# Patient Record
Sex: Female | Born: 1970 | Race: Black or African American | Hispanic: No | Marital: Married | Smoking: Former smoker
Health system: Southern US, Community
[De-identification: ages and names within clinical notes are randomized; demographics above are authoritative.]

## PROBLEM LIST (undated history)

## (undated) DIAGNOSIS — M1990 Unspecified osteoarthritis, unspecified site: Secondary | ICD-10-CM

## (undated) DIAGNOSIS — F32A Depression, unspecified: Secondary | ICD-10-CM

## (undated) DIAGNOSIS — I10 Essential (primary) hypertension: Secondary | ICD-10-CM

## (undated) DIAGNOSIS — D649 Anemia, unspecified: Secondary | ICD-10-CM

## (undated) DIAGNOSIS — R51 Headache: Secondary | ICD-10-CM

## (undated) DIAGNOSIS — F419 Anxiety disorder, unspecified: Secondary | ICD-10-CM

## (undated) DIAGNOSIS — J45909 Unspecified asthma, uncomplicated: Secondary | ICD-10-CM

## (undated) DIAGNOSIS — G2581 Restless legs syndrome: Secondary | ICD-10-CM

## (undated) DIAGNOSIS — S82852A Displaced trimalleolar fracture of left lower leg, initial encounter for closed fracture: Secondary | ICD-10-CM

## (undated) DIAGNOSIS — K219 Gastro-esophageal reflux disease without esophagitis: Secondary | ICD-10-CM

## (undated) DIAGNOSIS — J189 Pneumonia, unspecified organism: Secondary | ICD-10-CM

## (undated) DIAGNOSIS — R112 Nausea with vomiting, unspecified: Secondary | ICD-10-CM

## (undated) DIAGNOSIS — F319 Bipolar disorder, unspecified: Secondary | ICD-10-CM

## (undated) DIAGNOSIS — F329 Major depressive disorder, single episode, unspecified: Secondary | ICD-10-CM

## (undated) DIAGNOSIS — Z9889 Other specified postprocedural states: Secondary | ICD-10-CM

## (undated) DIAGNOSIS — R519 Headache, unspecified: Secondary | ICD-10-CM

## (undated) HISTORY — PX: DENTAL SURGERY: SHX609

## (undated) HISTORY — PX: TUBAL LIGATION: SHX77

---

## 1997-12-28 ENCOUNTER — Emergency Department (HOSPITAL_COMMUNITY): Admission: EM | Admit: 1997-12-28 | Discharge: 1997-12-28 | Payer: Self-pay | Admitting: Emergency Medicine

## 1999-06-03 ENCOUNTER — Emergency Department (HOSPITAL_COMMUNITY): Admission: EM | Admit: 1999-06-03 | Discharge: 1999-06-03 | Payer: Self-pay | Admitting: Emergency Medicine

## 1999-07-02 ENCOUNTER — Ambulatory Visit (HOSPITAL_COMMUNITY): Admission: RE | Admit: 1999-07-02 | Discharge: 1999-07-02 | Payer: Self-pay | Admitting: Gastroenterology

## 1999-07-02 ENCOUNTER — Encounter: Payer: Self-pay | Admitting: Gastroenterology

## 1999-07-23 ENCOUNTER — Emergency Department (HOSPITAL_COMMUNITY): Admission: EM | Admit: 1999-07-23 | Discharge: 1999-07-23 | Payer: Self-pay | Admitting: Emergency Medicine

## 1999-07-23 ENCOUNTER — Encounter: Payer: Self-pay | Admitting: Emergency Medicine

## 2000-08-29 ENCOUNTER — Encounter: Payer: Self-pay | Admitting: Emergency Medicine

## 2000-08-29 ENCOUNTER — Emergency Department (HOSPITAL_COMMUNITY): Admission: EM | Admit: 2000-08-29 | Discharge: 2000-08-29 | Payer: Self-pay | Admitting: Emergency Medicine

## 2000-08-31 ENCOUNTER — Encounter: Payer: Self-pay | Admitting: Emergency Medicine

## 2000-08-31 ENCOUNTER — Inpatient Hospital Stay (HOSPITAL_COMMUNITY): Admission: EM | Admit: 2000-08-31 | Discharge: 2000-09-03 | Payer: Self-pay | Admitting: Emergency Medicine

## 2000-09-01 ENCOUNTER — Encounter: Payer: Self-pay | Admitting: Neurosurgery

## 2000-09-05 ENCOUNTER — Encounter: Payer: Self-pay | Admitting: Emergency Medicine

## 2000-09-05 ENCOUNTER — Emergency Department (HOSPITAL_COMMUNITY): Admission: EM | Admit: 2000-09-05 | Discharge: 2000-09-05 | Payer: Self-pay | Admitting: Emergency Medicine

## 2000-09-05 ENCOUNTER — Ambulatory Visit (HOSPITAL_COMMUNITY): Admission: RE | Admit: 2000-09-05 | Discharge: 2000-09-05 | Payer: Self-pay

## 2001-10-17 ENCOUNTER — Emergency Department (HOSPITAL_COMMUNITY): Admission: EM | Admit: 2001-10-17 | Discharge: 2001-10-17 | Payer: Self-pay | Admitting: Emergency Medicine

## 2002-07-02 ENCOUNTER — Encounter: Payer: Self-pay | Admitting: Emergency Medicine

## 2002-07-02 ENCOUNTER — Emergency Department (HOSPITAL_COMMUNITY): Admission: EM | Admit: 2002-07-02 | Discharge: 2002-07-02 | Payer: Self-pay | Admitting: Emergency Medicine

## 2002-10-27 ENCOUNTER — Emergency Department (HOSPITAL_COMMUNITY): Admission: EM | Admit: 2002-10-27 | Discharge: 2002-10-27 | Payer: Self-pay | Admitting: Emergency Medicine

## 2003-02-14 ENCOUNTER — Encounter: Admission: RE | Admit: 2003-02-14 | Discharge: 2003-02-14 | Payer: Self-pay | Admitting: Internal Medicine

## 2003-02-14 ENCOUNTER — Encounter: Payer: Self-pay | Admitting: Internal Medicine

## 2003-04-20 ENCOUNTER — Encounter: Admission: RE | Admit: 2003-04-20 | Discharge: 2003-04-20 | Payer: Self-pay | Admitting: Internal Medicine

## 2003-04-20 ENCOUNTER — Encounter: Payer: Self-pay | Admitting: Internal Medicine

## 2003-08-03 ENCOUNTER — Emergency Department (HOSPITAL_COMMUNITY): Admission: EM | Admit: 2003-08-03 | Discharge: 2003-08-03 | Payer: Self-pay | Admitting: Emergency Medicine

## 2003-08-18 DIAGNOSIS — I10 Essential (primary) hypertension: Secondary | ICD-10-CM | POA: Insufficient documentation

## 2003-10-27 ENCOUNTER — Emergency Department (HOSPITAL_COMMUNITY): Admission: AD | Admit: 2003-10-27 | Discharge: 2003-10-27 | Payer: Self-pay | Admitting: Internal Medicine

## 2004-05-10 ENCOUNTER — Emergency Department (HOSPITAL_COMMUNITY): Admission: EM | Admit: 2004-05-10 | Discharge: 2004-05-10 | Payer: Self-pay

## 2005-03-23 DIAGNOSIS — M5137 Other intervertebral disc degeneration, lumbosacral region: Secondary | ICD-10-CM | POA: Insufficient documentation

## 2005-04-16 ENCOUNTER — Emergency Department (HOSPITAL_COMMUNITY): Admission: EM | Admit: 2005-04-16 | Discharge: 2005-04-16 | Payer: Self-pay | Admitting: Emergency Medicine

## 2005-05-28 ENCOUNTER — Emergency Department (HOSPITAL_COMMUNITY): Admission: EM | Admit: 2005-05-28 | Discharge: 2005-05-28 | Payer: Self-pay | Admitting: Emergency Medicine

## 2005-07-28 ENCOUNTER — Inpatient Hospital Stay (HOSPITAL_COMMUNITY): Admission: EM | Admit: 2005-07-28 | Discharge: 2005-07-29 | Payer: Self-pay | Admitting: Emergency Medicine

## 2006-10-23 ENCOUNTER — Emergency Department (HOSPITAL_COMMUNITY): Admission: EM | Admit: 2006-10-23 | Discharge: 2006-10-23 | Payer: Self-pay | Admitting: Emergency Medicine

## 2007-04-14 ENCOUNTER — Encounter: Admission: RE | Admit: 2007-04-14 | Discharge: 2007-04-14 | Payer: Self-pay | Admitting: Internal Medicine

## 2007-05-17 DIAGNOSIS — F319 Bipolar disorder, unspecified: Secondary | ICD-10-CM

## 2007-11-06 ENCOUNTER — Emergency Department (HOSPITAL_COMMUNITY): Admission: EM | Admit: 2007-11-06 | Discharge: 2007-11-07 | Payer: Self-pay | Admitting: Emergency Medicine

## 2007-11-09 ENCOUNTER — Encounter: Admission: RE | Admit: 2007-11-09 | Discharge: 2007-11-09 | Payer: Self-pay | Admitting: Internal Medicine

## 2008-11-22 ENCOUNTER — Encounter (INDEPENDENT_AMBULATORY_CARE_PROVIDER_SITE_OTHER): Payer: Self-pay | Admitting: Nurse Practitioner

## 2008-11-22 ENCOUNTER — Ambulatory Visit: Payer: Self-pay | Admitting: Internal Medicine

## 2008-11-22 DIAGNOSIS — K219 Gastro-esophageal reflux disease without esophagitis: Secondary | ICD-10-CM | POA: Insufficient documentation

## 2008-11-22 DIAGNOSIS — M25559 Pain in unspecified hip: Secondary | ICD-10-CM | POA: Insufficient documentation

## 2008-11-22 DIAGNOSIS — F329 Major depressive disorder, single episode, unspecified: Secondary | ICD-10-CM

## 2008-11-22 LAB — CONVERTED CEMR LAB
Bilirubin Urine: NEGATIVE
Blood in Urine, dipstick: NEGATIVE
Glucose, Urine, Semiquant: NEGATIVE
Ketones, urine, test strip: NEGATIVE
Nitrite: NEGATIVE
Protein, U semiquant: NEGATIVE
Specific Gravity, Urine: 1.015
Urobilinogen, UA: 0.2
WBC Urine, dipstick: NEGATIVE
pH: 6.5

## 2008-11-23 ENCOUNTER — Encounter (INDEPENDENT_AMBULATORY_CARE_PROVIDER_SITE_OTHER): Payer: Self-pay | Admitting: Nurse Practitioner

## 2008-11-23 LAB — CONVERTED CEMR LAB
Amphetamine Screen, Ur: NEGATIVE
Barbiturate Quant, Ur: NEGATIVE
Benzodiazepines.: NEGATIVE
Cocaine Metabolites: NEGATIVE
Creatinine,U: 145.6 mg/dL
Marijuana Metabolite: POSITIVE — AB
Methadone: NEGATIVE
Opiate Screen, Urine: NEGATIVE
Phencyclidine (PCP): NEGATIVE
Propoxyphene: NEGATIVE

## 2008-12-31 ENCOUNTER — Encounter (INDEPENDENT_AMBULATORY_CARE_PROVIDER_SITE_OTHER): Payer: Self-pay | Admitting: Nurse Practitioner

## 2009-01-21 ENCOUNTER — Encounter (INDEPENDENT_AMBULATORY_CARE_PROVIDER_SITE_OTHER): Payer: Self-pay | Admitting: Nurse Practitioner

## 2009-02-13 ENCOUNTER — Telehealth (INDEPENDENT_AMBULATORY_CARE_PROVIDER_SITE_OTHER): Payer: Self-pay | Admitting: Nurse Practitioner

## 2009-04-15 ENCOUNTER — Ambulatory Visit: Payer: Self-pay | Admitting: Nurse Practitioner

## 2009-04-16 ENCOUNTER — Encounter (INDEPENDENT_AMBULATORY_CARE_PROVIDER_SITE_OTHER): Payer: Self-pay | Admitting: Nurse Practitioner

## 2009-04-16 DIAGNOSIS — R809 Proteinuria, unspecified: Secondary | ICD-10-CM | POA: Insufficient documentation

## 2009-04-16 LAB — CONVERTED CEMR LAB
ALT: 8 units/L (ref 0–35)
AST: 12 units/L (ref 0–37)
Albumin: 4.1 g/dL (ref 3.5–5.2)
Alkaline Phosphatase: 61 units/L (ref 39–117)
BUN: 8 mg/dL (ref 6–23)
Basophils Absolute: 0 10*3/uL (ref 0.0–0.1)
Basophils Relative: 0 % (ref 0–1)
CO2: 22 meq/L (ref 19–32)
Calcium: 9.1 mg/dL (ref 8.4–10.5)
Chloride: 110 meq/L (ref 96–112)
Cholesterol: 139 mg/dL (ref 0–200)
Creatinine, Ser: 0.66 mg/dL (ref 0.40–1.20)
Eosinophils Absolute: 0.1 10*3/uL (ref 0.0–0.7)
Eosinophils Relative: 1 % (ref 0–5)
Glucose, Bld: 73 mg/dL (ref 70–99)
HCT: 36.8 % (ref 36.0–46.0)
HDL: 37 mg/dL — ABNORMAL LOW (ref >39)
Hemoglobin: 12 g/dL (ref 12.0–15.0)
LDL Cholesterol: 94 mg/dL (ref 0–99)
Lymphocytes Relative: 29 % (ref 12–46)
Lymphs Abs: 2.5 10*3/uL (ref 0.7–4.0)
MCHC: 32.6 g/dL (ref 30.0–36.0)
MCV: 92.7 fL (ref 78.0–100.0)
Microalb, Ur: 17.24 mg/dL — ABNORMAL HIGH (ref 0.00–1.89)
Monocytes Absolute: 0.8 10*3/uL (ref 0.1–1.0)
Monocytes Relative: 9 % (ref 3–12)
Neutro Abs: 5.4 10*3/uL (ref 1.7–7.7)
Neutrophils Relative %: 62 % (ref 43–77)
Platelets: 284 10*3/uL (ref 150–400)
Potassium: 4.8 meq/L (ref 3.5–5.3)
RBC: 3.97 M/uL (ref 3.87–5.11)
RDW: 15.7 % — ABNORMAL HIGH (ref 11.5–15.5)
Sodium: 141 meq/L (ref 135–145)
TSH: 0.53 microintl units/mL (ref 0.350–4.500)
Total Bilirubin: 0.2 mg/dL — ABNORMAL LOW (ref 0.3–1.2)
Total CHOL/HDL Ratio: 3.8
Total Protein: 6.8 g/dL (ref 6.0–8.3)
Triglycerides: 39 mg/dL (ref <150)
VLDL: 8 mg/dL (ref 0–40)
WBC: 8.8 10*3/uL (ref 4.0–10.5)

## 2009-05-06 ENCOUNTER — Telehealth (INDEPENDENT_AMBULATORY_CARE_PROVIDER_SITE_OTHER): Payer: Self-pay | Admitting: Nurse Practitioner

## 2009-06-24 ENCOUNTER — Telehealth (INDEPENDENT_AMBULATORY_CARE_PROVIDER_SITE_OTHER): Payer: Self-pay | Admitting: Nurse Practitioner

## 2009-06-30 ENCOUNTER — Emergency Department (HOSPITAL_COMMUNITY): Admission: EM | Admit: 2009-06-30 | Discharge: 2009-06-30 | Payer: Self-pay | Admitting: Emergency Medicine

## 2009-07-16 ENCOUNTER — Telehealth (INDEPENDENT_AMBULATORY_CARE_PROVIDER_SITE_OTHER): Payer: Self-pay | Admitting: *Deleted

## 2009-08-21 ENCOUNTER — Emergency Department (HOSPITAL_COMMUNITY): Admission: EM | Admit: 2009-08-21 | Discharge: 2009-08-21 | Payer: Self-pay | Admitting: Family Medicine

## 2009-08-27 ENCOUNTER — Telehealth (INDEPENDENT_AMBULATORY_CARE_PROVIDER_SITE_OTHER): Payer: Self-pay | Admitting: Nurse Practitioner

## 2009-09-16 ENCOUNTER — Encounter (INDEPENDENT_AMBULATORY_CARE_PROVIDER_SITE_OTHER): Payer: Self-pay | Admitting: Nurse Practitioner

## 2009-10-03 ENCOUNTER — Encounter: Admission: RE | Admit: 2009-10-03 | Discharge: 2009-10-03 | Payer: Self-pay | Admitting: Sports Medicine

## 2009-10-22 ENCOUNTER — Encounter: Admission: RE | Admit: 2009-10-22 | Discharge: 2009-10-22 | Payer: Self-pay | Admitting: Sports Medicine

## 2010-05-07 ENCOUNTER — Telehealth (INDEPENDENT_AMBULATORY_CARE_PROVIDER_SITE_OTHER): Payer: Self-pay | Admitting: Nurse Practitioner

## 2010-05-13 ENCOUNTER — Ambulatory Visit: Payer: Self-pay | Admitting: Nurse Practitioner

## 2010-05-13 DIAGNOSIS — R519 Headache, unspecified: Secondary | ICD-10-CM | POA: Insufficient documentation

## 2010-05-13 DIAGNOSIS — F17200 Nicotine dependence, unspecified, uncomplicated: Secondary | ICD-10-CM | POA: Insufficient documentation

## 2010-05-13 DIAGNOSIS — R51 Headache: Secondary | ICD-10-CM

## 2010-05-13 LAB — CONVERTED CEMR LAB
Bilirubin Urine: NEGATIVE
Blood in Urine, dipstick: NEGATIVE
Glucose, Urine, Semiquant: NEGATIVE
Ketones, urine, test strip: NEGATIVE
Microalb, Ur: 0.5 mg/dL (ref 0.00–1.89)
Nitrite: NEGATIVE
Protein, U semiquant: NEGATIVE
Specific Gravity, Urine: >=1.03
Urobilinogen, UA: 0.2
WBC Urine, dipstick: NEGATIVE
pH: 6

## 2010-06-27 ENCOUNTER — Ambulatory Visit (HOSPITAL_COMMUNITY): Admission: RE | Admit: 2010-06-27 | Discharge: 2010-06-27 | Payer: Self-pay | Admitting: Neurological Surgery

## 2010-06-27 HISTORY — PX: LAMINECTOMY AND MICRODISCECTOMY LUMBAR SPINE: SHX1913

## 2010-07-15 ENCOUNTER — Encounter: Admission: RE | Admit: 2010-07-15 | Discharge: 2010-07-15 | Payer: Self-pay | Admitting: Neurological Surgery

## 2010-07-22 ENCOUNTER — Telehealth (INDEPENDENT_AMBULATORY_CARE_PROVIDER_SITE_OTHER): Payer: Self-pay | Admitting: Nurse Practitioner

## 2010-08-05 ENCOUNTER — Encounter (INDEPENDENT_AMBULATORY_CARE_PROVIDER_SITE_OTHER): Payer: Self-pay | Admitting: Nurse Practitioner

## 2010-08-06 ENCOUNTER — Encounter (INDEPENDENT_AMBULATORY_CARE_PROVIDER_SITE_OTHER): Payer: Self-pay | Admitting: Nurse Practitioner

## 2010-08-12 ENCOUNTER — Encounter (INDEPENDENT_AMBULATORY_CARE_PROVIDER_SITE_OTHER): Payer: Self-pay | Admitting: Nurse Practitioner

## 2010-09-07 ENCOUNTER — Encounter: Payer: Self-pay | Admitting: Sports Medicine

## 2010-09-16 NOTE — Letter (Signed)
Summary: ORTHOPEDIC PROGRESS NOTES  ORTHOPEDIC PROGRESS NOTES   Imported By: Arta Bruce 11/22/2009 12:35:48  _____________________________________________________________________  External Attachment:    Type:   Image     Comment:   External Document

## 2010-09-16 NOTE — Progress Notes (Signed)
Summary: REFILLS  Phone Note Call from Patient Call back at Home Phone [phone]   Reason for Call: Refill Medication Summary of Call: Patricia Davila PT. MS GOODMAN SAYS THAT SHE NEEDS REFILLS ON ALL HER MEDICATIONS. THEY ARE PERCOCET, ATENOLOL, HCTZ, AND HER LISINIPRIL. SHE USES [facility] ON [address]. SHE SAYS THAT THE REASON WHY  SHE DIDN'T COME TO HER APPOINTMENTS WITH YOU, IS BECAUSE HER MEDICAID CARD WAS [place] ACCESS TO DR. MORIRA AND NOW IT HAS OUR NAME ON IT AND SHE IS SCHEDULED TO SEE YOU ON JULY THE 8th, BUT SHE IS OUT OF THESE MEDS AND SHE IS IN PAIN AND CAN HARDLY WALK. Initial call taken by: Leodis Rains,  February 13, 2009 9:05 AM  Follow-up for Phone Call        The pt called in again because she needs all her medications refills.  The pt cannot get up of bed because she is under a lot of pain.  Marland KitchenManon Hilding  February 14, 2009 12:18 PM   Additional Follow-up for Phone Call Additional follow up Details #1::        CALLED TO SEE IF HER MEDICATION HAVE BEEN CALLED IN, SHE SAYS THAT SHE HAS BEEN WITHOUT HER BP FOR 2 OR 3 DAYS NOW.Cala Bradford Tinnin  February 14, 2009 3:42 PM  Levon Hedger  February 14, 2009 3:47 PM  Forward to N. Daphine Deutscher, FNP     Additional Follow-up for Phone Call Additional follow up Details #2::    Rx printed and in basket pt needs to come pick up - meds include a narcotic Follow-up by: Lehman Prom FNP,  February 14, 2009 5:37 PM  Additional Follow-up for Phone Call Additional follow up Details #3:: Details for Additional Follow-up Action Taken: Levon Hedger  February 19, 2009 5:50 PM Left message for pt to return call to the office.  Levon Hedger  February 26, 2009 4:19 PM Left message on machine for pt to return call to the office.  pt picked up.      Prescriptions: PERCOCET 5-325 MG TABS (OXYCODONE-ACETAMINOPHEN) one tablet by mouth two times a day as needed for pain  #50 x 0   Entered and Authorized by:   Lehman Prom FNP   Signed by:    Lehman Prom FNP on 02/14/2009   Method used:   Print then Give to Patient   RxID:   [id number] LISINOPRIL 40 MG TABS (LISINOPRIL) One tablet by mouth daily for blood pressure  #30 x 1   Entered and Authorized by:   Lehman Prom FNP   Signed by:   Lehman Prom FNP on 02/14/2009   Method used:   Print then Give to Patient   RxID:   [id number] HYDROCHLOROTHIAZIDE 25 MG TABS (HYDROCHLOROTHIAZIDE) Take one (1) by mouth daily  #30 x 1   Entered and Authorized by:   Lehman Prom FNP   Signed by:   Lehman Prom FNP on 02/14/2009   Method used:   Print then Give to Patient   RxID:   [id number] NAPROSYN 500 MG TABS (NAPROXEN) 1 tablet by mouth two times a day for pain  #60 x 0   Entered and Authorized by:   Lehman Prom FNP   Signed by:   Lehman Prom FNP on 02/14/2009   Method used:   Print then Give to Patient   RxID:   [id number]

## 2010-09-16 NOTE — Progress Notes (Signed)
Summary: REQUESTING REFILLS  Phone Note Call from Patient Call back at Home Phone [phone]   Reason for Call: Refill Medication Summary of Call: Patricia Davila CALLED TO SEE IF SHE CAN GET A REFILL ON HER LISINOPRIL AND ATENOLOL HCTZ. SHE SASY THAT SHE CALLED [facility] TODAY AND THEY WERE SUPOSE TO HAVE FAXED OVER A REQUEST.  MS Davila IS SCHEDULE TO COME IN ON 09/27 Initial call taken by: Leodis Rains,  May 07, 2010 10:00 AM  Follow-up for Phone Call        Do you want to refill these meds?  Last seen 03/2009. Follow-up by: Dutch Quint RN,  May 07, 2010 11:40 AM  Additional Follow-up for Phone Call Additional follow up Details #1::        meds last refilled in 03/2009 will refill on her visit her on next week she needs a visit with labs Additional Follow-up by: Lehman Prom FNP,  May 07, 2010 12:04 PM    Additional Follow-up for Phone Call Additional follow up Details #2::    Unable to leave message - no answer.  Dutch Quint RN  May 07, 2010 12:57 PM  Advised pt. of provider's response -- verbalized understanding.  Dutch Quint RN  May 08, 2010 3:16 PM

## 2010-09-16 NOTE — Assessment & Plan Note (Signed)
Summary: HTN   Vital Signs:  Patient profile:   40 year old female Weight:      165 pounds BMI:     32.34 Temp:     97.0 degrees F oral Pulse rate:   62 / minute Pulse rhythm:   regular Resp:     22 per minute BP sitting:   160 / 100  (left arm) Cuff size:   regular  Vitals Entered By: Levon Hedger (May 13, 2010 10:48 AM)  Nutrition Counseling: Patient's BMI is greater than 25 and therefore counseled on weight management options. CC: follow-up visit BP...pt has been wi thout medication for months...has been having alot of migraine headaches more frequently lately...vaginal discharge with itching x 1 month, Hypertension Management, Abdominal Pain, Depression, Headache Is Patient Diabetic? No Pain Assessment Patient in pain? no       Does patient need assistance? Functional Status Self care Ambulation Normal   CC:  follow-up visit BP...pt has been wi thout medication for months...has been having alot of migraine headaches more frequently lately...vaginal discharge with itching x 1 month, Hypertension Management, Abdominal Pain, Depression, and Headache.  History of Present Illness:  Pt into the office for f/u on high blood pressure She reports that she has been in the ER twice since her last visit here. She reports that her she has not take her BP meds in over 6 months.   Depression History:      Positive alarm features for depression include insomnia and impaired concentration (indecisiveness).  However, she denies recurrent thoughts of death or suicide.        Psychosocial stress factors include major life changes.  The patient denies that she feels like life is not worth living, denies that she wishes that she were dead, and denies that she has thought about ending her life.         Dyspepsia History:      She has no alarm features of dyspepsia including no history of melena, hematochezia, dysphagia, persistent vomiting, or involuntary weight loss > 5%.  There  is a prior history of GERD.  The patient does not have a prior history of documented ulcer disease.  An H-2 blocker medication is not currently being taken.    Headache HPI:      The patient comes in for chronic management of headaches which have been unstable.  Since the last visit, the frequency of headaches have increased, and the intensity of the headaches have increased.  The headaches will last anywhere from 30 minutes to several days at a time.  She has approximately 5+ headaches per month.  Headaches have been occurring since age 49.  There is no family history of migraine headaches.        On a scale of 1-10, she describes the headache as a 5.  The headaches are not associated with an aura.  The location of the headaches are unilateral-left.  Headache quality is sharp (knife-like).  The headaches are associated with vomiting and photophobia.        The patient denies confusion and seizures.         Hypertension History:      She complains of headache, but denies chest pain and palpitations.  She notes no problems with any antihypertensive medication side effects.        Positive major cardiovascular risk factors include hypertension and current tobacco user.  Negative major cardiovascular risk factors include female age less than 93 years old and no  history of diabetes or hyperlipidemia.        Further assessment for target organ damage reveals no history of ASHD, cardiac end-organ damage (CHF/LVH), stroke/TIA, peripheral vascular disease, renal insufficiency, or hypertensive retinopathy.      Habits & Providers  Alcohol-Tobacco-Diet     Alcohol drinks/day: 0     Tobacco Status: current     Tobacco Counseling: to quit use of tobacco products     Cigarette Packs/Day: 2 cigs per day  Exercise-Depression-Behavior     Does Patient Exercise: no     Exercise Counseling: to improve exercise regimen     Depression Counseling: not indicated; screening negative for depression     Drug Use:  never     Seat Belt Use: always  Allergies: No Known Drug Allergies  Social History: Packs/Day:  2 cigs per day  Review of Systems General:  Denies fatigue. CV:  Complains of chest pain or discomfort. Resp:  Denies cough. GI:  Denies abdominal pain, nausea, and vomiting. Neuro:  Complains of headaches. Psych:  Complains of anxiety.  Physical Exam  General:  alert.   Head:  normocephalic.   Eyes:  pupils reactive to light.   Mouth:  tongue ring Lungs:  normal breath sounds.   Heart:  normal rate and regular rhythm.   Abdomen:  normal bowel sounds.   Msk:  up to the exam table Neurologic:  alert & oriented X3.   Skin:  color normal.   Psych:  Oriented X3.     Impression & Recommendations:  Problem # 1:  HYPERTENSION, BENIGN ESSENTIAL (ICD-401.1)  Blood pressure is extremely elevated advised pt to restart meds Her updated medication list for this problem includes:    Hydrochlorothiazide 25 Mg Tabs (Hydrochlorothiazide) .[name]... Take one (1) by mouth daily    Lisinopril 20 Mg Tabs (Lisinopril) ..... One tablet by mouth daily for blood pressure    Propranolol Hcl 20 Mg Tabs (Propranolol hcl) ..... One tablet by mouth two times a day for blood pressure  Orders: UA Dipstick w/o Micro (manual) (60454) T-Urine Microalbumin w/creat. ratio [id number])  Problem # 2:  TOBACCO ABUSE (ICD-305.1) advised cessation.  Problem # 3:  DEPRESSION, MAJOR (ICD-296.20) pt likely needs referral to mental health based on previous dx  Problem # 4:  HEADACHE (ICD-784.0) advised pt that headaches are likely due to htn need to control bp The following medications were removed from the medication list:    Naprosyn 500 Mg Tabs (Naproxen) .[name]... 1 tablet by mouth two times a day for pain    Percocet 5-325 Mg Tabs (Oxycodone-acetaminophen) ..... One tablet by mouth two times a day as needed for pain Her updated medication list for this problem includes:    Propranolol Hcl 20 Mg Tabs  (Propranolol hcl) ..... One tablet by mouth two times a day for blood pressure  Complete Medication List: 1)  Hydrochlorothiazide 25 Mg Tabs (Hydrochlorothiazide) .... Take one (1) by mouth daily 2)  Lisinopril 20 Mg Tabs (Lisinopril) .... One tablet by mouth daily for blood pressure 3)  Propranolol Hcl 20 Mg Tabs (Propranolol hcl) .... One tablet by mouth two times a day for blood pressure 4)  Viibryd 40 Mg Tabs (Vilazodone hcl) .... Titration pack  Hypertension Assessment/Plan:      The patient's hypertensive risk group is category B: At least one risk factor (excluding diabetes) with no target organ damage.  Her calculated 10 year risk of coronary heart disease is 3 %.  Today's blood pressure is  160/100.  Her blood pressure goal is < 140/90.    Patient Instructions: 1)  Keep your appointment on next week 2)  Do not eat after midnight before your next visit 3)  lab, PAP, PQH-9, u/a, microalbumin 4)  High blood pressure- - restart your medications  5)  Migraine - likely increased in frequency due to high blood pressure. 6)  Anxiety and mood - given your psych history - you may need referral to specialist. There are some other options available for mental health referral 7)  start viibrya and take according to starter package Prescriptions: VIIBRYD 40 MG TABS (VILAZODONE HCL) titration pack  #1 pack x 0   Entered and Authorized by:   Lehman Prom FNP   Signed by:   Lehman Prom FNP on 05/13/2010   Method used:   Samples Given   RxID:   [id number] LISINOPRIL 20 MG TABS (LISINOPRIL) One tablet by mouth daily for blood pressure  #30 x 3   Entered and Authorized by:   Lehman Prom FNP   Signed by:   Lehman Prom FNP on 05/13/2010   Method used:   Print then Give to Patient   RxID:   [id number] HYDROCHLOROTHIAZIDE 25 MG TABS (HYDROCHLOROTHIAZIDE) Take one (1) by mouth daily  #30 x 3   Entered and Authorized by:   Lehman Prom FNP   Signed by:   Lehman Prom  FNP on 05/13/2010   Method used:   Print then Give to Patient   RxID:   [id number] PROPRANOLOL HCL 20 MG TABS (PROPRANOLOL HCL) One tablet by mouth two times a day for blood pressure  #60 x 3   Entered and Authorized by:   Lehman Prom FNP   Signed by:   Lehman Prom FNP on 05/13/2010   Method used:   Print then Give to Patient   RxID:   [id number]   Laboratory Results   Urine Tests  Date/Time Received: May 13, 2010 10:58 AM   Routine Urinalysis   Color: yellow Appearance: Clear Glucose: negative   (Normal Range: Negative) Bilirubin: negative   (Normal Range: Negative) Ketone: negative   (Normal Range: Negative) Spec. Gravity: >=1.030   (Normal Range: 1.003-1.035) Blood: negative   (Normal Range: Negative) pH: 6.0   (Normal Range: 5.0-8.0) Protein: negative   (Normal Range: Negative) Urobilinogen: 0.2   (Normal Range: 0-1) Nitrite: negative   (Normal Range: Negative) Leukocyte Esterace: negative   (Normal Range: Negative)

## 2010-09-16 NOTE — Progress Notes (Signed)
Summary: percocet refill  Phone Note Call from Patient Call back at [phone]   Summary of Call: pt calling to get refills on her percocet....last rx 12/8 .... Initial call taken by: Mikey College CMA,  August 27, 2009 10:55 AM  Follow-up for Phone Call        Pt was supposed to get an orthopedic referral (ordered back in August 2010). Clarify with pt if she went to this appt because the intent was NOT for her to continue to get chronic pain meds from this office.  She should find the source for pain and get it treated by the specialist Follow-up by: Lehman Prom FNP,  August 27, 2009 11:21 AM  Additional Follow-up for Phone Call Additional follow up Details #1::        left message to return call.....Marland KitchenMarland KitchenMikey College CMA  August 27, 2009 1:08 PM   Levon Hedger  August 28, 2009 3:12 PM spoke with pt and she says that she never heard anything back about the appt to Ortho,  she says that she spoke with someone when she came in to get her last Rx about the referral.  Pt states she went to urgent care on last week and they gave her a knee brace and some crutches.  I will pull up notes from E-charts and put them in your cubby.    Additional Follow-up for Phone Call Additional follow up Details #2::    Percocet RX printed and in basket. pt can pick up. I reviewed ER record and they too agree that pt needs ortho refer (Confirm phone number in EMR as this will print on referral - if it is different will need to MANUALLY change on the referral as I have already printed ortho referral in put in basket) notify pt that she will be notified of her ortho  referral and she will need to be sure to keep the appt this office is NOT continue to provide her narcotic medication with specialist intervention Follow-up by: Lehman Prom FNP,  August 29, 2009 8:21 AM  Additional Follow-up for Phone Call Additional follow up Details #3:: Details for Additional Follow-up Action Taken: Levon Hedger   August 29, 2009 9:46 AM Called [phone] could not leave a message.  Levon Hedger  August 30, 2009 5:33 PM called [phone] could not leave a message.  Pt now has an appt scheduled 1/28 w/Dr Kramer(orthopedic) [address] [phone]).pt is aware of appt per Carney Bern....Marland KitchenMarland KitchenMikey College CMA  September 02, 2009 3:33 PM   Prescriptions: PERCOCET 5-325 MG TABS (OXYCODONE-ACETAMINOPHEN) one tablet by mouth two times a day as needed for pain  #50 x 0   Entered and Authorized by:   Lehman Prom FNP   Signed by:   Lehman Prom FNP on 08/29/2009   Method used:   Print then Give to Patient   RxID:   [id number]

## 2010-09-18 NOTE — Letter (Signed)
Summary: MAILED REQUESTED RECORDS TO [facility]  MAILED REQUESTED RECORDS TO [facility]   Imported By: Arta Bruce 08/12/2010 14:53:55  _____________________________________________________________________  External Attachment:    Type:   Image     Comment:   External Document

## 2010-09-18 NOTE — Miscellaneous (Signed)
Summary: PA approved for Viibryd 40 mg. x1 year 08/05/10-08/06/11  Clinical Lists Changes  PA approved for Viibryd 40 mg. x1 year 08/05/10-08/06/11  Appended Document: PA approved for Viibryd 40 mg. x1 year 08/05/10-08/06/11 Viibryd needs to be added to medication list- was removed.  Dutch Quint RN  August 07, 2010 1:03 PM

## 2010-09-18 NOTE — Letter (Signed)
Summary: [facility]  [facility]   Imported By: Arta Bruce 08/19/2010 14:50:35  _____________________________________________________________________  External Attachment:    Type:   Image     Comment:   External Document

## 2010-09-18 NOTE — Progress Notes (Signed)
Summary: PA needed for Viibryd  Phone Note Call from Patient   Summary of Call: Pt called saying that [facility] gave her a pill packet @ her last visit  for blood prssure and they also help her migraines/would like for her to call [facility] in to [facility] on [address]///[phone] Initial call taken by: Arta Bruce,  July 22, 2010 9:16 AM  Follow-up for Phone Call        Needs refill on Viibryd -- do you want to refill?  Dutch Quint RN  July 22, 2010 4:02 PM    Additional Follow-up for Phone Call Additional follow up Details #1::        Refills done and sent to pharmacy - this may need prior approval Additional Follow-up by: Lehman Prom FNP,  July 22, 2010 4:35 PM    Additional Follow-up for Phone Call Additional follow up Details #2::    PA request sent from pharmacy for Viibryd -- waiting for fax from [facility].  Dutch Quint RN  July 25, 2010 3:21 PM  Received prior authorization request for Viibryd .  Pt. doesn't show a history of having tried and failed meds.  Do you want to change this med or attempt to obtain PA?  Dutch Quint RN  July 29, 2010 12:08 PM    Additional Follow-up for Phone Call Additional follow up Details #3:: Details for Additional Follow-up Action Taken: Yes, try prior approval.  I looked back and pt has been on both zyprexa and lexapro in the past symptoms uncontrolled with previous meds n.martin,fnp  July 30, 2010  10:14 AM  PA in progress.  Dutch Quint RN  August 04, 2010 5:00 PM  PA approved x1 year.  Pharmacy notified by fax.  Left message on answering machine for pt. to return call.  Dutch Quint RN  August 06, 2010 9:24 AM  Pt. notified of PA approval and to call pharmacy.  Dutch Quint RN  August 07, 2010 1:01 PM    New/Updated Medications: VIIBRYD 40 MG TABS (VILAZODONE HCL) One tablet by mouth daily Prescriptions: VIIBRYD 40 MG TABS (VILAZODONE HCL) One tablet by mouth  daily  #30 x 5   Entered and Authorized by:   Lehman Prom FNP   Signed by:   Lehman Prom FNP on 07/22/2010   Method used:   Electronically to        [facility] [address] [address] [phone]* (retail)       [address]       [place], [place]  [address]       Ph: [phone]       Fax: [phone]   RxID:   [id number]

## 2010-10-28 LAB — BASIC METABOLIC PANEL
BUN: 7 mg/dL (ref 6–23)
CO2: 25 mEq/L (ref 19–32)
Calcium: 9.2 mg/dL (ref 8.4–10.5)
Chloride: 105 mEq/L (ref 96–112)
Creatinine, Ser: 0.75 mg/dL (ref 0.4–1.2)
GFR calc Af Amer: >60 mL/min (ref >60)
GFR calc non Af Amer: >60 mL/min (ref >60)
Glucose, Bld: 84 mg/dL (ref 70–99)
Potassium: 4 mEq/L (ref 3.5–5.1)
Sodium: 137 mEq/L (ref 135–145)

## 2010-10-28 LAB — CBC
HCT: 36.6 % (ref 36.0–46.0)
Hemoglobin: 12.3 g/dL (ref 12.0–15.0)
MCH: 29.5 pg (ref 26.0–34.0)
MCHC: 33.6 g/dL (ref 30.0–36.0)
MCV: 87.8 fL (ref 78.0–100.0)
Platelets: 287 10*3/uL (ref 150–400)
RBC: 4.17 MIL/uL (ref 3.87–5.11)
RDW: 15.6 % — ABNORMAL HIGH (ref 11.5–15.5)
WBC: 7.8 10*3/uL (ref 4.0–10.5)

## 2010-10-28 LAB — DIFFERENTIAL
Basophils Relative: 0 % (ref 0–1)
Eosinophils Absolute: 0.1 10*3/uL (ref 0.0–0.7)
Monocytes Absolute: 0.4 10*3/uL (ref 0.1–1.0)
Monocytes Relative: 5 % (ref 3–12)
Neutrophils Relative %: 65 % (ref 43–77)

## 2010-11-19 LAB — URINALYSIS, ROUTINE W REFLEX MICROSCOPIC
Glucose, UA: NEGATIVE mg/dL
Leukocytes, UA: NEGATIVE
pH: 8 (ref 5.0–8.0)

## 2010-11-19 LAB — POCT I-STAT, CHEM 8
Creatinine, Ser: 0.7 mg/dL (ref 0.4–1.2)
Glucose, Bld: 83 mg/dL (ref 70–99)
Hemoglobin: 12.6 g/dL (ref 12.0–15.0)
Potassium: 3.6 mEq/L (ref 3.5–5.1)
TCO2: 24 mmol/L (ref 0–100)

## 2010-11-19 LAB — URINE MICROSCOPIC-ADD ON

## 2010-12-30 ENCOUNTER — Other Ambulatory Visit: Payer: Self-pay | Admitting: Neurological Surgery

## 2010-12-30 DIAGNOSIS — M545 Low back pain: Secondary | ICD-10-CM
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
## 2011-01-02 NOTE — Discharge Summary (Signed)
NAMEClide Davila NO.:  [id number]   MEDICAL RECORD NO.:  [id number]          PATIENT TYPE:  INP   LOCATION:  1431                         FACILITY:  [facility]   PHYSICIAN:  Nelma Rothman, MD   DATE OF BIRTH:  1970/11/26   DATE OF ADMISSION:  07/27/2005  DATE OF DISCHARGE:  07/29/2005                                 DISCHARGE SUMMARY   PRIMARY CARE PHYSICIAN:  Ralene Ok, M.D.   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  Depression.  2.  Hypertension.   PROCEDURES:  1.  Chest x-ray demonstrated no evidence of acute cardiopulmonary disease.      Three views of the left hand demonstrated no evidence for osteomyelitis      or acute osseous abnormality.  2.  Three views of the left ankle demonstrated no acute osseous      abnormalities.  3.  Four views of the left elbow demonstrated a probable joint effusion as      demonstrated by elevation of the anterior and posterior fat pad. There      were no acute osseous abnormalities or fractures.   LABORATORY DATA:  White blood cell count on the morning of discharge was  12.1, hemoglobin 11.4, and platelet count 310,000. Creatinine was 0.6. C-  reactive protein was 5.3. HIV test was negative. Serum RPR was negative. GC  and chlamydia PCR probe were positive in the urine.   HOSPITAL COURSE:  Patricia Davila is a 40 year old female who presented to the  [facility] Emergency Department on December 11, 206 with complaints of  polyarthralgia, specifically left ankle and left elbow pain. Exam was also  consistent with a left fourth digit tenosynovitis of the hand. Given history  of multiple sexually-transmitted diseases, there was clinical concern for  disseminated gonococcal infection. She was started on IV ceftriaxone as well  as oral doxycycline for concomitant treatment for chlamydia. Her urine was  sent for a GC and chlamydia PCR which did in fact return  positive. Following  two doses of IV ceftriaxone, she had improved dramatically and was  requesting to be discharged to home. This was discussed briefly with Dr.  Orvan Falconer of infectious disease consult. Given her dramatic improvement in  symptoms with antibiotic therapy, this was felt to represent a disseminated  gonococcal infection. Therefore, she will be discharged home with an  additional five days of oral doxycycline to complete a seven-day course for  concomitant chlamydia infection as well as an additional five days of oral  ciprofloxacin to complete therapy for a  disseminated gonococcal infection.  She will follow up with Dr. Ludwig Clarks later this week and will also be  contacted by the health department. She was reminded that she needs to have  her partner treated for both chlamydia and gonorrhea, and she states that  she has already informed him of such.   DISCHARGE MEDICATIONS:  1.  Doxycycline 100 mg p.o. b.i.d. x5 days.  2.  Ciprofloxacin 500 mg p.o. b.i.d. x5 days.  3.  Resume previous home medications including:  1.  Atenolol 25 mg p.o. daily.      2.  Prilosec 20 mg p.o. daily.      3.  Zoloft 50 mg daily.  4.  Vicodin 5/500 1-2 tablets p.o. q.4 h. p.r.n. pain, dispense #15.   DISCHARGE INSTRUCTIONS:  The patient was reminded that she needs to have her  sexual partner treated for both chlamydia and gonorrhea. She will complete  both antibiotics until all pills have been taken. She will return to Dr.  Ludwig Clarks later this week for followup.   CONDITION ON DISCHARGE:  The patient was much improved on discharge. She was  afebrile, vital signs stable, and wished to be discharged to home, which I  believe is reasonable.      Nelma Rothman, MD  Electronically Signed     RAR/MEDQ  D:  07/29/2005  T:  07/30/2005  Job:  [id number]   cc:   Ralene Ok, M.D.  Fax: [phone]
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
## 2011-01-02 NOTE — H&P (Signed)
NAMEClide Davila NO.:  [id number]   MEDICAL RECORD NO.:  [id number]          PATIENT TYPE:  EMS   LOCATION:  ED                           FACILITY:  [facility]   PHYSICIAN:  Lonia Blood, M.D.       DATE OF BIRTH:  September 05, 1970   DATE OF ADMISSION:  07/27/2005  DATE OF DISCHARGE:                                HISTORY & PHYSICAL   PRIMARY CARE PHYSICIAN:  Ralene Ok, M.D.   CHIEF COMPLAINT:  Joint pain.   HISTORY OF PRESENT ILLNESS:  [name] is a 40 year old African American  woman with history of sexually transmitted disease who presents to [facility] Emergency Room with one-day history of severe left ankle pain, left  elbow pain and left hand pain.  She denies any fever or chills.  She reports  that she has a vaginal discharge and some lower abdominal pain for the past  week.   PAST MEDICAL HISTORY:  1.  Depression.  2.  Hypertension.  3.  Bilateral tubal ligation.  4.  Gonorrhea.  5.  Chlamydia.  6.  Trichomonas.  7.  The patient had an HIV test about two weeks ago that was negative.   FAMILY HISTORY:  Positive for colon cancer in the mother and one sister.   SOCIAL HISTORY:  The patient smokes a pack of cigarettes a day.  Does not  drink alcohol.  She was disabled.  She has three children. She is single.   MEDICATIONS:  Zyprexa, omeprazole, atenolol, Zoloft and Vicodin.   ALLERGIES:  No known drug allergies.   REVIEW OF SYSTEMS:  As per HPI.  Other systems are negative.   REVIEW OF SYSTEMS:  As per HPI.  Twelve other systems were reviewed and were  negative.   PHYSICAL EXAMINATION:  VITAL SIGNS:  Temperature 97.6, pulse 60,  respirations 20, blood pressure 117/73, saturation 99% on room air.  GENERAL:  The patient is in no acute distress.  Alert and oriented to place,  person and time.  Well-developed, well-nourished.  HEENT:  Head is normocephalic, atraumatic.  Pupils are equal and round and  reactive to light and accommodation.   Extraocular muscles were intact. Mouth  without ulceration.  Throat is clear.  NECK:  Supple without JVD.  No carotid bruits.  CHEST:  Clear to auscultation bilaterally without wheezes, rhonchi, or  crackles.  HEART:  Regular rate and rhythm without murmurs, rubs, or gallops.  ABDOMEN:  Soft, nontender, nondistended.  Bowel sounds are present.  No  palpable hepatosplenomegaly.  GU:  There is no CVA tenderness.  EXTREMITIES:  The left ankle has mild edema, some tenderness to  mobilization.  Left elbow has minimal edema and some tenderness on  palpation.  There is tenderness over the palpation of the  metacarpophalangeal joint in the left hand.  SKIN:  Warm and dry.  No suspicious rashes.  PELVIC:  Deferred for a private room.  NEUROLOGIC:  The patient has 5/5 strength in all four extremities  bilaterally.  Cranial nerves II-XII are intact.   LABORATORY DATA:  White blood cell  count 13,000, hemoglobin 11, platelet  count 305, ANC 11,000.  Sodium 138, potassium 3, chloride 107, bicarb 24,  BUN 7, creatinine 0.7, glucose 137. ESR  45.   IMPRESSION:  1.  Polyarthritis.  This is asymmetric in nature.  Given the patient's      history, this is worrisome for reactive arthritis to [facility] or Chlamydia.      Plan is to obtain blood cultures and urine cultures and urine for      gonorrhea and Chlamydia.  Test also for syphilis and HIV.  Treat      empirically with intravenous Rocephin and oral doxycycline.  Will also      obtain x-ray of the elbow and ankle.  2.  Hypokalemia.  The patient most likely is taking a diuretic at home. She      just does not remember the name.  Plan is to replace the potassium      orally.  3.  History of sexually transmitted diseases.  Will test for gonorrhea,      Chlamydia, syphilis, and HIV and treat as necessary.      Lonia Blood, M.D.  Electronically Signed     SL/MEDQ  D:  07/28/2005  T:  07/28/2005  Job:  [id number]   cc:   Ralene Ok, M.D.  Fax:  [phone]

## 2011-01-05 ENCOUNTER — Other Ambulatory Visit: Payer: Self-pay

## 2011-01-06 ENCOUNTER — Ambulatory Visit
Admission: RE | Admit: 2011-01-06 | Discharge: 2011-01-06 | Disposition: A | Discharge status: Home / Self Care | Payer: PRIVATE HEALTH INSURANCE | Source: Ambulatory Visit | Attending: Neurological Surgery | Admitting: Neurological Surgery

## 2011-01-06 DIAGNOSIS — M545 Low back pain: Secondary | ICD-10-CM

## 2011-01-06 MED ORDER — GADOBENATE DIMEGLUMINE 529 MG/ML IV SOLN
15.0000 mL | Freq: Once | INTRAVENOUS | Status: AC | PRN
  Administered 2011-01-06: 15 mL via INTRAVENOUS

## 2011-05-11 LAB — POCT I-STAT, CHEM 8
Calcium, Ion: 1.1 — ABNORMAL LOW
Creatinine, Ser: 0.9
Glucose, Bld: 80
Hemoglobin: 13.3
Sodium: 141
TCO2: 22

## 2011-05-11 LAB — DIFFERENTIAL
Eosinophils Absolute: 0.1
Eosinophils Relative: 1
Lymphs Abs: 2.7
Monocytes Absolute: 0.4
Monocytes Relative: 4

## 2011-05-11 LAB — CBC
HCT: 37.3
Hemoglobin: 12.7
MCV: 89.2
Platelets: 253
WBC: 10.4

## 2011-05-11 LAB — URINALYSIS, ROUTINE W REFLEX MICROSCOPIC
Bilirubin Urine: NEGATIVE
Glucose, UA: NEGATIVE
Hgb urine dipstick: NEGATIVE
Specific Gravity, Urine: 1.015
Urobilinogen, UA: 0.2

## 2012-07-10 ENCOUNTER — Emergency Department (HOSPITAL_COMMUNITY): Payer: PRIVATE HEALTH INSURANCE

## 2012-07-10 ENCOUNTER — Emergency Department (HOSPITAL_COMMUNITY)
Admission: EM | Admit: 2012-07-10 | Discharge: 2012-07-10 | Disposition: A | Discharge status: Home / Self Care | Payer: PRIVATE HEALTH INSURANCE | Attending: Emergency Medicine | Admitting: Emergency Medicine

## 2012-07-10 ENCOUNTER — Encounter (HOSPITAL_COMMUNITY): Payer: Self-pay

## 2012-07-10 DIAGNOSIS — S0181XA Laceration without foreign body of other part of head, initial encounter: Secondary | ICD-10-CM

## 2012-07-10 DIAGNOSIS — M545 Low back pain, unspecified: Secondary | ICD-10-CM | POA: Insufficient documentation

## 2012-07-10 DIAGNOSIS — R51 Headache: Secondary | ICD-10-CM | POA: Insufficient documentation

## 2012-07-10 DIAGNOSIS — M542 Cervicalgia: Secondary | ICD-10-CM | POA: Insufficient documentation

## 2012-07-10 DIAGNOSIS — Z23 Encounter for immunization: Secondary | ICD-10-CM | POA: Insufficient documentation

## 2012-07-10 DIAGNOSIS — S0003XA Contusion of scalp, initial encounter: Secondary | ICD-10-CM | POA: Insufficient documentation

## 2012-07-10 DIAGNOSIS — T148XXA Other injury of unspecified body region, initial encounter: Secondary | ICD-10-CM

## 2012-07-10 DIAGNOSIS — S0180XA Unspecified open wound of other part of head, initial encounter: Secondary | ICD-10-CM | POA: Insufficient documentation

## 2012-07-10 DIAGNOSIS — M546 Pain in thoracic spine: Secondary | ICD-10-CM | POA: Insufficient documentation

## 2012-07-10 HISTORY — DX: Unspecified asthma, uncomplicated: J45.909

## 2012-07-10 HISTORY — DX: Essential (primary) hypertension: I10

## 2012-07-10 MED ORDER — ONDANSETRON HCL 4 MG/2ML IJ SOLN
4.0000 mg | Freq: Once | INTRAMUSCULAR | Status: AC
  Administered 2012-07-10: 4 mg via INTRAVENOUS
  Filled 2012-07-10: qty 2

## 2012-07-10 MED ORDER — HYDROMORPHONE HCL PF 2 MG/ML IJ SOLN
2.0000 mg | Freq: Once | INTRAMUSCULAR | Status: AC
  Administered 2012-07-10: 2 mg via INTRAMUSCULAR
  Filled 2012-07-10: qty 1

## 2012-07-10 MED ORDER — CYCLOBENZAPRINE HCL 10 MG PO TABS: 10.0000 mg | ORAL_TABLET | Freq: Two times a day (BID) | ORAL | Status: DC | PRN

## 2012-07-10 MED ORDER — OXYCODONE-ACETAMINOPHEN 5-325 MG PO TABS: 1.0000 | ORAL_TABLET | Freq: Four times a day (QID) | ORAL | Status: DC | PRN

## 2012-07-10 MED ORDER — TETANUS-DIPHTH-ACELL PERTUSSIS 5-2.5-18.5 LF-MCG/0.5 IM SUSP
0.5000 mL | Freq: Once | INTRAMUSCULAR | Status: AC
  Administered 2012-07-10: 0.5 mL via INTRAMUSCULAR
  Filled 2012-07-10: qty 0.5

## 2012-07-10 MED ORDER — ONDANSETRON HCL 4 MG PO TABS: 4.0000 mg | ORAL_TABLET | Freq: Four times a day (QID) | ORAL | Status: DC

## 2012-07-10 MED ORDER — ONDANSETRON 4 MG PO TBDP
4.0000 mg | ORAL_TABLET | Freq: Once | ORAL | Status: AC
  Administered 2012-07-10: 4 mg via ORAL
  Filled 2012-07-10: qty 1

## 2012-07-10 NOTE — ED Provider Notes (Signed)
Medical screening examination/treatment/procedure(s) were conducted as a shared visit with non-physician practitioner(s) and myself.  I personally evaluated the patient during the encounter    Gwyneth Sprout, MD 07/10/12 [phone]
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
## 2012-07-10 NOTE — ED Provider Notes (Signed)
I was asked to performed forehead laceration repair.  LACERATION REPAIR Performed by: Fayrene Helper Authorized byFayrene Helper Consent: Verbal consent obtained. Risks and benefits: risks, benefits and alternatives were discussed Consent given by: patient Patient identity confirmed: provided demographic data Prepped and Draped in normal sterile fashion Wound explored  Laceration Location: L upper forehead  Laceration Length: 1.5 cm  No Foreign Bodies seen or palpated  Anesthesia: local infiltration  Local anesthetic: lidocaine 2% w epinephrine  Anesthetic total: 8 ml  Irrigation method: syringe Amount of cleaning: standard  Skin closure: prolene 5.0  Number of sutures: 4  Technique: simple interrupted  Patient tolerance: Patient tolerated the procedure well with no immediate complications.   Fayrene Helper, PA-C 07/10/12 1831

## 2012-10-24 ENCOUNTER — Ambulatory Visit: Payer: PRIVATE HEALTH INSURANCE | Admitting: Internal Medicine

## 2012-10-24 ENCOUNTER — Encounter: Payer: Self-pay | Admitting: Internal Medicine

## 2012-10-24 DIAGNOSIS — F329 Major depressive disorder, single episode, unspecified: Secondary | ICD-10-CM | POA: Insufficient documentation

## 2013-03-29 ENCOUNTER — Encounter (HOSPITAL_COMMUNITY): Payer: Self-pay | Admitting: Emergency Medicine

## 2013-03-29 ENCOUNTER — Emergency Department (HOSPITAL_COMMUNITY)
Admission: EM | Admit: 2013-03-29 | Discharge: 2013-03-29 | Disposition: A | Discharge status: Home / Self Care | Payer: PRIVATE HEALTH INSURANCE | Attending: Emergency Medicine | Admitting: Emergency Medicine

## 2013-03-29 DIAGNOSIS — I10 Essential (primary) hypertension: Secondary | ICD-10-CM | POA: Insufficient documentation

## 2013-03-29 DIAGNOSIS — F17200 Nicotine dependence, unspecified, uncomplicated: Secondary | ICD-10-CM | POA: Insufficient documentation

## 2013-03-29 DIAGNOSIS — J36 Peritonsillar abscess: Secondary | ICD-10-CM | POA: Insufficient documentation

## 2013-03-29 DIAGNOSIS — Z79899 Other long term (current) drug therapy: Secondary | ICD-10-CM | POA: Insufficient documentation

## 2013-03-29 DIAGNOSIS — J45909 Unspecified asthma, uncomplicated: Secondary | ICD-10-CM | POA: Insufficient documentation

## 2013-03-29 DIAGNOSIS — R05 Cough: Secondary | ICD-10-CM | POA: Insufficient documentation

## 2013-03-29 DIAGNOSIS — R059 Cough, unspecified: Secondary | ICD-10-CM | POA: Insufficient documentation

## 2013-03-29 DIAGNOSIS — R509 Fever, unspecified: Secondary | ICD-10-CM | POA: Insufficient documentation

## 2013-03-29 MED ORDER — MORPHINE SULFATE 4 MG/ML IJ SOLN
4.0000 mg | Freq: Once | INTRAMUSCULAR | Status: AC
  Administered 2013-03-29: 4 mg via INTRAVENOUS
  Filled 2013-03-29: qty 1

## 2013-03-29 MED ORDER — ONDANSETRON 4 MG PO TBDP
8.0000 mg | ORAL_TABLET | Freq: Once | ORAL | Status: AC
  Administered 2013-03-29: 8 mg via ORAL
  Filled 2013-03-29: qty 2

## 2013-03-29 MED ORDER — ONDANSETRON HCL 4 MG PO TABS: 4.0000 mg | ORAL_TABLET | Freq: Four times a day (QID) | ORAL | Status: DC

## 2013-03-29 MED ORDER — OXYCODONE-ACETAMINOPHEN 5-325 MG PO TABS: 2.0000 | ORAL_TABLET | Freq: Four times a day (QID) | ORAL | Status: DC | PRN

## 2013-03-29 MED ORDER — MORPHINE SULFATE 4 MG/ML IJ SOLN
4.0000 mg | Freq: Once | INTRAMUSCULAR | Status: AC
  Administered 2013-03-29: 4 mg via INTRAMUSCULAR
  Filled 2013-03-29: qty 1

## 2013-03-29 MED ORDER — DEXAMETHASONE SODIUM PHOSPHATE 10 MG/ML IJ SOLN
10.0000 mg | Freq: Once | INTRAMUSCULAR | Status: AC
  Administered 2013-03-29: 10 mg via INTRAVENOUS
  Filled 2013-03-29: qty 1

## 2013-03-29 MED ORDER — PENICILLIN V POTASSIUM 500 MG PO TABS: 500.0000 mg | ORAL_TABLET | Freq: Four times a day (QID) | ORAL | Status: DC

## 2013-03-29 MED ORDER — SODIUM CHLORIDE 0.9 % IV BOLUS (SEPSIS)
1000.0000 mL | Freq: Once | INTRAVENOUS | Status: AC
  Administered 2013-03-29: 1000 mL via INTRAVENOUS

## 2013-03-29 NOTE — ED Provider Notes (Signed)
CSN: [id number]     Arrival date & time 03/29/13  1030 History     First MD Initiated Contact with Patient 03/29/13 1040     Chief Complaint  Patient presents with  . Sore Throat   (Consider location/radiation/quality/duration/timing/severity/associated sxs/prior Treatment) HPI Comments: Patient is a 42 year old female with history of asthma and hypertension who presents today with sore throat since Sunday evening. The pain is gradually worsening and she describes the pain as scratchy. She has been unable to eat or drink anything. She has taken mucinex and throat lozenges without relief. The pain radiates down her right neck and into her right ear. She has an associated cough which is worse at night. She admits to a subjective fever. She denies shortness of breath, trismus, or history of diabetes.  Patient is a 43 y.o. female presenting with pharyngitis. The history is provided by the patient. No language interpreter was used.  Sore Throat Associated symptoms include coughing, a fever and a sore throat.    Past Medical History  Diagnosis Date  . Hypertension   . Asthma    Past Surgical History  Procedure Laterality Date  . Laminectomy and microdiscectomy lumbar spine   06/27/2010  . Tubal ligation Bilateral    Family History  Problem Relation Age of Onset  . Colon cancer Mother   . Colon cancer Sister    History  Substance Use Topics  . Smoking status: Current Every Day Smoker  . Smokeless tobacco: Not on file  . Alcohol Use: No   OB History   Grav Para Term Preterm Abortions TAB SAB Ect Mult Living                 Review of Systems  Constitutional: Positive for fever.  HENT: Positive for sore throat.   Respiratory: Positive for cough. Negative for shortness of breath and stridor.   All other systems reviewed and are negative.    Allergies  Review of patient's allergies indicates no known allergies.  Home Medications   Current Outpatient Rx  Name  Route  Sig   Dispense  Refill  . Aspirin-Salicylamide-Caffeine (BC HEADACHE POWDER PO)   Oral   Take 1 packet by mouth once.         . Pseudoephedrine-Guaifenesin (MUCINEX D PO)   Oral   Take 10 mL by mouth every 4 (four) hours.         . Throat Lozenges (LOZENGES MT)   Mouth/Throat   Use as directed 1 lozenge in the mouth or throat daily as needed (for sore throat).         . hydrochlorothiazide (HYDRODIURIL) 25 MG tablet   Oral   Take 25 mg by mouth daily.         . ondansetron (ZOFRAN) 4 MG tablet   Oral   Take 1 tablet (4 mg total) by mouth every 6 (six) hours.   12 tablet   0    BP 136/99  Pulse 101  Temp(Src) 97 F (36.1 C) (Oral)  Resp 24  SpO2 98%  LMP 03/28/2013 Physical Exam  Nursing note and vitals reviewed. Constitutional: She is oriented to person, place, and time. She appears well-developed and well-nourished. No distress.  HENT:  Head: Normocephalic and atraumatic.  Right Ear: Tympanic membrane and ear canal normal. There is tenderness.  Left Ear: Tympanic membrane, external ear and ear canal normal.  Nose: Nose normal.  Mouth/Throat: Mucous membranes are dry. No trismus in the jaw. Edematous  present. Tonsillar abscesses present.  Bilateral tonsillar enlargement right worse than left with exudate. Swelling of her right soft palate. Uvular swelling and deviation to the left. Maintaining airway and own secretions. No stridor.  No submental edema.   Eyes: Conjunctivae are normal.  Neck: Normal range of motion.  Cardiovascular: Normal rate, regular rhythm and normal heart sounds.   Pulmonary/Chest: Effort normal and breath sounds normal. No stridor. No respiratory distress. She has no wheezes. She has no rales.  Abdominal: Soft. She exhibits no distension.  Musculoskeletal: Normal range of motion.  Neurological: She is alert and oriented to person, place, and time. She has normal strength.  Skin: Skin is warm and dry. She is not diaphoretic. No erythema.   Psychiatric: She has a normal mood and affect. Her behavior is normal.    ED Course   Procedures (including critical care time)  Labs Reviewed  RAPID STREP SCREEN  CULTURE, GROUP A STREP   11:08 AM Discussed case with Dr. Annalee Genta. He recommends that patient get IVF, decadron, and abx. If no improvement in symptoms in 48 hours make appointment with him for drainage.    No results found. 1. Peritonsillar abscess     MDM  Patient presents with peritonsillar abscess. Discussed case with Dr. Annalee Genta, ENT who recommends IV fluids, Decadron, antibiotics and 48 hour followup. Patient is stable, maintaining her own airway. No trismus. Able to tolerate PO fluids in the ED without difficulty. Strict return instructions were given to the patient. She was feeling improved upon discharge. Dr. Effie Shy evaluated patient and agrees with plan. Vital signs stable for discharge. Patient / Family / Caregiver informed of clinical course, understand medical decision-making process, and agree with plan.   Mora Bellman, PA-C 03/29/13 1318

## 2013-03-29 NOTE — ED Notes (Signed)
Started 2 days ago with sore throat, cough and now beginning to have pain in right ear.

## 2013-03-29 NOTE — ED Notes (Signed)
PT HAS BEEN OUT OF BP AND ASTHMA MEDS X 1 YEAR.

## 2013-03-29 NOTE — ED Provider Notes (Signed)
Patricia Davila is a 42 y.o. female Who presents for evaluation of sore throat for 3 days. She is having difficulty swallowing. She denies fever, chills, nausea, vomiting, weakness, or dizziness.  Exam: Alert, calm, cooperative. No trismus. Oral exam remarkable for bilateral tonsillar hypertrophy and exudate. There is right peritonsillar edema and swelling with mild uvular deviation to the left. Cervical exam indicates mild, right, adenopathy at the angle of the jaw  Plan consult ENT for help with disposition  Medical screening examination/treatment/procedure(s) were conducted as a shared visit with non-physician practitioner(s) and myself.  I personally evaluated the patient during the encounter    Flint Melter, MD 03/29/13 1540

## 2013-03-31 LAB — CULTURE, GROUP A STREP

## 2013-04-20 ENCOUNTER — Encounter (HOSPITAL_COMMUNITY): Payer: Self-pay | Admitting: Emergency Medicine

## 2013-04-20 ENCOUNTER — Emergency Department (HOSPITAL_COMMUNITY)
Admission: EM | Admit: 2013-04-20 | Discharge: 2013-04-21 | Disposition: A | Discharge status: Home / Self Care | Payer: No Typology Code available for payment source | Attending: Emergency Medicine | Admitting: Emergency Medicine

## 2013-04-20 ENCOUNTER — Emergency Department (HOSPITAL_COMMUNITY): Payer: No Typology Code available for payment source

## 2013-04-20 DIAGNOSIS — S42402A Unspecified fracture of lower end of left humerus, initial encounter for closed fracture: Secondary | ICD-10-CM

## 2013-04-20 DIAGNOSIS — Y92410 Unspecified street and highway as the place of occurrence of the external cause: Secondary | ICD-10-CM | POA: Insufficient documentation

## 2013-04-20 DIAGNOSIS — S0990XA Unspecified injury of head, initial encounter: Secondary | ICD-10-CM

## 2013-04-20 DIAGNOSIS — I10 Essential (primary) hypertension: Secondary | ICD-10-CM | POA: Insufficient documentation

## 2013-04-20 DIAGNOSIS — S8990XA Unspecified injury of unspecified lower leg, initial encounter: Secondary | ICD-10-CM | POA: Insufficient documentation

## 2013-04-20 DIAGNOSIS — Y9389 Activity, other specified: Secondary | ICD-10-CM | POA: Insufficient documentation

## 2013-04-20 DIAGNOSIS — S0993XA Unspecified injury of face, initial encounter: Secondary | ICD-10-CM | POA: Insufficient documentation

## 2013-04-20 DIAGNOSIS — F17200 Nicotine dependence, unspecified, uncomplicated: Secondary | ICD-10-CM | POA: Insufficient documentation

## 2013-04-20 DIAGNOSIS — IMO0002 Reserved for concepts with insufficient information to code with codable children: Secondary | ICD-10-CM | POA: Insufficient documentation

## 2013-04-20 DIAGNOSIS — S298XXA Other specified injuries of thorax, initial encounter: Secondary | ICD-10-CM | POA: Insufficient documentation

## 2013-04-20 DIAGNOSIS — S5290XA Unspecified fracture of unspecified forearm, initial encounter for closed fracture: Secondary | ICD-10-CM | POA: Insufficient documentation

## 2013-04-20 DIAGNOSIS — Z3202 Encounter for pregnancy test, result negative: Secondary | ICD-10-CM | POA: Insufficient documentation

## 2013-04-20 DIAGNOSIS — J45909 Unspecified asthma, uncomplicated: Secondary | ICD-10-CM | POA: Insufficient documentation

## 2013-04-20 MED ORDER — FENTANYL CITRATE 0.05 MG/ML IJ SOLN
50.0000 ug | Freq: Once | INTRAMUSCULAR | Status: AC
  Administered 2013-04-21: 50 ug via INTRAVENOUS
  Filled 2013-04-20: qty 2

## 2013-04-20 NOTE — ED Notes (Signed)
Spoke with radiology- pt still in radiology.
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
## 2013-04-21 ENCOUNTER — Emergency Department (HOSPITAL_COMMUNITY): Payer: No Typology Code available for payment source

## 2013-04-21 ENCOUNTER — Encounter (HOSPITAL_COMMUNITY): Payer: Self-pay | Admitting: Radiology

## 2013-04-21 LAB — CBC WITH DIFFERENTIAL/PLATELET
Basophils Absolute: 0 10*3/uL (ref 0.0–0.1)
Basophils Relative: 0 % (ref 0–1)
Eosinophils Absolute: 0.1 10*3/uL (ref 0.0–0.7)
Lymphs Abs: 2.5 10*3/uL (ref 0.7–4.0)
MCH: 30.7 pg (ref 26.0–34.0)
Neutrophils Relative %: 68 % (ref 43–77)
Platelets: 300 10*3/uL (ref 150–400)
RBC: 4.36 MIL/uL (ref 3.87–5.11)

## 2013-04-21 LAB — URINALYSIS, DIPSTICK ONLY
Bilirubin Urine: NEGATIVE
Nitrite: NEGATIVE
Specific Gravity, Urine: 1.021 (ref 1.005–1.030)
pH: 6 (ref 5.0–8.0)

## 2013-04-21 LAB — POCT I-STAT, CHEM 8
BUN: 7 mg/dL (ref 6–23)
Chloride: 108 mEq/L (ref 96–112)
Creatinine, Ser: 0.9 mg/dL (ref 0.50–1.10)
Sodium: 143 mEq/L (ref 135–145)

## 2013-04-21 LAB — POCT PREGNANCY, URINE: Preg Test, Ur: NEGATIVE

## 2013-04-21 MED ORDER — MORPHINE SULFATE 4 MG/ML IJ SOLN
4.0000 mg | Freq: Once | INTRAMUSCULAR | Status: AC
  Administered 2013-04-21: 4 mg via INTRAVENOUS
  Filled 2013-04-21: qty 1

## 2013-04-21 MED ORDER — IOHEXOL 300 MG/ML  SOLN
100.0000 mL | Freq: Once | INTRAMUSCULAR | Status: AC | PRN
  Administered 2013-04-21: 100 mL via INTRAVENOUS

## 2013-04-21 MED ORDER — OXYCODONE-ACETAMINOPHEN 5-325 MG PO TABS: 1.0000 | ORAL_TABLET | ORAL | Status: DC | PRN

## 2013-04-21 NOTE — ED Provider Notes (Signed)
SPLINT APPLICATION Date/Time: 12/23/2012 3:38 PM Authorized by: Raelyn Number Consent: Verbal consent obtained. Risks and benefits: risks, benefits and alternatives were discussed Consent given by: patient Splint applied by: orthopedic technician Location details: Elbow left  Splint type: Posterior mold, long arm  Post-procedure: The splinted body part was neurovascularly unchanged following the procedure. Patient tolerance: Patient tolerated the procedure well with no immediate complications.     Layla Maw Ward, DO 04/21/13 [phone]

## 2013-04-21 NOTE — ED Provider Notes (Signed)
Medical screening examination/treatment/procedure(s) were performed by non-physician practitioner and as supervising physician I was immediately available for consultation/collaboration.  Khylen Riolo L Kamilya Wakeman, MD 04/21/13 0038 

## 2013-04-21 NOTE — ED Notes (Signed)
Pt returned from radiology.

## 2013-08-29 ENCOUNTER — Emergency Department (HOSPITAL_COMMUNITY)
Admission: EM | Admit: 2013-08-29 | Discharge: 2013-08-30 | Disposition: A | Discharge status: Home / Self Care | Payer: PRIVATE HEALTH INSURANCE | Attending: Emergency Medicine | Admitting: Emergency Medicine

## 2013-08-29 ENCOUNTER — Encounter (HOSPITAL_COMMUNITY): Payer: Self-pay | Admitting: Emergency Medicine

## 2013-08-29 DIAGNOSIS — S0181XA Laceration without foreign body of other part of head, initial encounter: Secondary | ICD-10-CM

## 2013-08-29 DIAGNOSIS — F17200 Nicotine dependence, unspecified, uncomplicated: Secondary | ICD-10-CM | POA: Insufficient documentation

## 2013-08-29 DIAGNOSIS — S0180XA Unspecified open wound of other part of head, initial encounter: Secondary | ICD-10-CM | POA: Insufficient documentation

## 2013-08-29 DIAGNOSIS — I10 Essential (primary) hypertension: Secondary | ICD-10-CM | POA: Insufficient documentation

## 2013-08-29 DIAGNOSIS — S0990XA Unspecified injury of head, initial encounter: Secondary | ICD-10-CM

## 2013-08-29 DIAGNOSIS — J45909 Unspecified asthma, uncomplicated: Secondary | ICD-10-CM | POA: Insufficient documentation

## 2013-08-29 MED ORDER — ACETAMINOPHEN 325 MG PO TABS
650.0000 mg | ORAL_TABLET | Freq: Once | ORAL | Status: AC
  Administered 2013-08-30: 650 mg via ORAL
  Filled 2013-08-29: qty 2

## 2013-08-29 MED ORDER — IBUPROFEN 400 MG PO TABS
600.0000 mg | ORAL_TABLET | Freq: Once | ORAL | Status: AC
  Administered 2013-08-30: 600 mg via ORAL
  Filled 2013-08-29 (×2): qty 1
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
## 2013-08-30 NOTE — Discharge Instructions (Signed)
Return for persistent vomiting, confusion, passing out.  If you were given medicines take as directed.  If you are on coumadin or contraceptives realize their levels and effectiveness is altered by many different medicines.  If you have any reaction (rash, tongues swelling, other) to the medicines stop taking and see a physician.   Please follow up as directed and return to the ER or see a physician for new or worsening symptoms.  Thank you.  Concussion, Adult A concussion, or closed-head injury, is a brain injury caused by a direct blow to the head or by a quick and sudden movement (jolt) of the head or neck. Concussions are usually not life-threatening. Even so, the effects of a concussion can be serious. If you have had a concussion before, you are more likely to experience concussion-like symptoms after a direct blow to the head.  CAUSES   Direct blow to the head, such as from running into another player during a soccer game, being hit in a fight, or hitting your head on a hard surface.  A jolt of the head or neck that causes the brain to move back and forth inside the skull, such as in a car crash. SIGNS AND SYMPTOMS  The signs of a concussion can be hard to notice. Early on, they may be missed by you, family members, and health care providers. You may look fine but act or feel differently. Symptoms are usually temporary, but they may last for days, weeks, or even longer. Some symptoms may appear right away while others may not show up for hours or days. Every head injury is different. Symptoms include:   Mild to moderate headaches that will not go away.  A feeling of pressure inside your head.  Having more trouble than usual:   Learning or remembering things you have heard.  Answering questions.  Paying attention or concentrating.   Organizing daily tasks.   Making decisions and solving problems.   Slowness in thinking, acting or reacting, speaking, or reading.    Getting lost or being easily confused.   Feeling tired all the time or lacking energy (fatigued).   Feeling drowsy.   Sleep disturbances.   Sleeping more than usual.   Sleeping less than usual.   Trouble falling asleep.   Trouble sleeping (insomnia).   Loss of balance or feeling lightheaded or dizzy.   Nausea or vomiting.   Numbness or tingling.   Increased sensitivity to:   Sounds.   Lights.   Distractions.   Vision problems or eyes that tire easily.   Diminished sense of taste or smell.   Ringing in the ears.   Mood changes such as feeling sad or anxious.   Becoming easily irritated or angry for little or no reason.   Lack of motivation.  Seeing or hearing things other people do not see or hear (hallucinations). DIAGNOSIS  Your health care provider can usually diagnose a concussion based on a description of your injury and symptoms. He or she will ask whether you passed out (lost consciousness) and whether you are having trouble remembering events that happened right before and during your injury.  Your evaluation might include:   A brain scan to look for signs of injury to the brain. Even if the test shows no injury, you may still have a concussion.   Blood tests to be sure other problems are not present. TREATMENT   Concussions are usually treated in an emergency department, in urgent care, or at  a clinic. You may need to stay in the hospital overnight for further treatment.   Tell your health care provider if you are taking any medicines, including prescription medicines, over-the-counter medicines, and natural remedies. Some medicines, such as blood thinners (anticoagulants) and aspirin, may increase the chance of complications. Also tell your health care provider whether you have had alcohol or are taking illegal drugs. This information may affect treatment.  Your health care provider will send you home with important  instructions to follow.  How fast you will recover from a concussion depends on many factors. These factors include how severe your concussion is, what part of your brain was injured, your age, and how healthy you were before the concussion.  Most people with mild injuries recover fully. Recovery can take time. In general, recovery is slower in older persons. Also, persons who have had a concussion in the past or have other medical problems may find that it takes longer to recover from their current injury. HOME CARE INSTRUCTIONS  General Instructions  Carefully follow the directions your health care provider gave you.  Only take over-the-counter or prescription medicines for pain, discomfort, or fever as directed by your health care provider.  Take only those medicines that your health care provider has approved.  Do not drink alcohol until your health care provider says you are well enough to do so. Alcohol and certain other drugs may slow your recovery and can put you at risk of further injury.  If it is harder than usual to remember things, write them down.  If you are easily distracted, try to do one thing at a time. For example, do not try to watch TV while fixing dinner.  Talk with family members or close friends when making important decisions.  Keep all follow-up appointments. Repeated evaluation of your symptoms is recommended for your recovery.  Watch your symptoms and tell others to do the same. Complications sometimes occur after a concussion. Older adults with a brain injury may have a higher risk of serious complications such as of a blood clot on the brain.  Tell your teachers, school nurse, school counselor, coach, athletic trainer, or work Freight forwarder about your injury, symptoms, and restrictions. Tell them about what you can or cannot do. They should watch for:   Increased problems with attention or concentration.   Increased difficulty remembering or learning new  information.   Increased time needed to complete tasks or assignments.   Increased irritability or decreased ability to cope with stress.   Increased symptoms.   Rest. Rest helps the brain to heal. Make sure you:  Get plenty of sleep at night. Avoid staying up late at night.  Keep the same bedtime hours on weekends and weekdays.  Rest during the day. Take daytime naps or rest breaks when you feel tired.  Limit activities that require a lot of thought or concentration. These includes   Doing homework or job-related work.   Watching TV.   Working on the computer.  Avoid any situation where there is potential for another head injury (football, hockey, soccer, basketball, martial arts, downhill snow sports and horseback riding). Your condition will get worse every time you experience a concussion. You should avoid these activities until you are evaluated by the appropriate follow-up caregivers. Returning To Your Regular Activities You will need to return to your normal activities slowly, not all at once. You must give your body and brain enough time for recovery.  Do not return to  sports or other athletic activities until your health care provider tells you it is safe to do so.  Ask your health care provider when you can drive, ride a bicycle, or operate heavy machinery. Your ability to react may be slower after a brain injury. Never do these activities if you are dizzy.  Ask your health care provider about when you can return to work or school. Preventing Another Concussion It is very important to avoid another brain injury, especially before you have recovered. In rare cases, another injury can lead to permanent brain damage, brain swelling, or death. The risk of this is greatest during the first 7 10 days after a head injury. Avoid injuries by:   Wearing a seat belt when riding in a car.   Drinking alcohol only in moderation.   Wearing a helmet when biking, skiing,  skateboarding, skating, or doing similar activities.  Avoiding activities that could lead to a second concussion, such as contact or recreational sports, until your health care provider says it is OK.  Taking safety measures in your home.   Remove clutter and tripping hazards from floors and stairways.   Use grab bars in bathrooms and handrails by stairs.   Place non-slip mats on floors and in bathtubs.   Improve lighting in dim areas. SEEK MEDICAL CARE IF:   You have increased problems paying attention or concentrating.   You have increased difficulty remembering or learning new information.   You need more time to complete tasks or assignments than before.   You have increased irritability or decreased ability to cope with stress.  You have more symptoms than before. Seek medical care if you have any of the following symptoms for more than 2 weeks after your injury:   Lasting (chronic) headaches.   Dizziness or balance problems.   Nausea.  Vision problems.   Increased sensitivity to noise or light.   Depression or mood swings.   Anxiety or irritability.   Memory problems.   Difficulty concentrating or paying attention.   Sleep problems.   Feeling tired all the time. SEEK IMMEDIATE MEDICAL CARE IF:   You have severe or worsening headaches. These may be a sign of a blood clot in the brain.  You have weakness (even if only in one hand, leg, or part of the face).  You have numbness.  You have decreased coordination.   You vomit repeatedly.  You have increased sleepiness.  One pupil is larger than the other.   You have convulsions.   You have slurred speech.   You have increased confusion. This may be a sign of a blood clot in the brain.  You have increased restlessness, agitation, or irritability.   You are unable to recognize people or places.   You have neck pain.   It is difficult to wake you up.   You have unusual  behavior changes.   You lose consciousness. MAKE SURE YOU:   Understand these instructions.  Will watch your condition.  Will get help right away if you are not doing well or get worse. Document Released: 10/24/2003 Document Revised: 04/05/2013 Document Reviewed: 02/23/2013 [facility] Patient Information 2014 [place], [place].
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
## 2013-12-28 ENCOUNTER — Emergency Department (HOSPITAL_COMMUNITY): Payer: PRIVATE HEALTH INSURANCE

## 2013-12-28 ENCOUNTER — Emergency Department (HOSPITAL_COMMUNITY)
Admission: EM | Admit: 2013-12-28 | Discharge: 2013-12-28 | Disposition: A | Discharge status: Home / Self Care | Payer: PRIVATE HEALTH INSURANCE | Attending: Emergency Medicine | Admitting: Emergency Medicine

## 2013-12-28 ENCOUNTER — Encounter (HOSPITAL_COMMUNITY): Payer: Self-pay | Admitting: Emergency Medicine

## 2013-12-28 DIAGNOSIS — F17200 Nicotine dependence, unspecified, uncomplicated: Secondary | ICD-10-CM | POA: Diagnosis not present

## 2013-12-28 DIAGNOSIS — M545 Low back pain, unspecified: Secondary | ICD-10-CM | POA: Insufficient documentation

## 2013-12-28 DIAGNOSIS — Z79899 Other long term (current) drug therapy: Secondary | ICD-10-CM | POA: Insufficient documentation

## 2013-12-28 DIAGNOSIS — I10 Essential (primary) hypertension: Secondary | ICD-10-CM | POA: Insufficient documentation

## 2013-12-28 DIAGNOSIS — J45909 Unspecified asthma, uncomplicated: Secondary | ICD-10-CM | POA: Diagnosis not present

## 2013-12-28 DIAGNOSIS — M549 Dorsalgia, unspecified: Secondary | ICD-10-CM

## 2013-12-28 DIAGNOSIS — Z9889 Other specified postprocedural states: Secondary | ICD-10-CM | POA: Insufficient documentation

## 2013-12-28 MED ORDER — OXYCODONE-ACETAMINOPHEN 5-325 MG PO TABS: 1.0000 | ORAL_TABLET | ORAL | Status: DC | PRN

## 2013-12-28 MED ORDER — OXYCODONE-ACETAMINOPHEN 5-325 MG PO TABS
1.0000 | ORAL_TABLET | Freq: Once | ORAL | Status: AC
  Administered 2013-12-28: 1 via ORAL
  Filled 2013-12-28: qty 1

## 2013-12-28 NOTE — ED Notes (Signed)
The patient says she was doing house work today and thinks she "re-injured" her back.  She has had back surgery in 2011.  She says she has been hurting since Sunday and Monday she did house work and thinks she hurt it again.  She has been in severe pain since this morning.  The patient came to be evaluated.  She rates her pain 10/10.

## 2013-12-28 NOTE — ED Provider Notes (Signed)
CSN: [id number]     Arrival date & time 12/28/13  1234 History   First MD Initiated Contact with Patient 12/28/13 1417     Chief Complaint  Patient presents with  . Back Pain    The patient says she was doing house work today and thinks she "re-injured" her back.  She has had back surgery in 2011.     (Consider location/radiation/quality/duration/timing/severity/associated sxs/prior Treatment) Patient is a 43 y.o. female presenting with back pain. The history is provided by the patient and medical records.  Back Pain  This is a 43 y.o. female with past history significant for hypertension and asthma, presenting to the ED for low back pain. Patient states over the weekend she was doing some housework and thinks she may have injured her back. Patient states pain is progressively gotten worse over the past several days. Pain described as a week localized to her left lower back with some radiation to buttock and left posterior thigh but did not descend past the knee. Pain worse with weight bearing and ambulation.  She denies numbness/paresthesias or weakness of LE. Denies loss of bowel or bladder control. Patient does have prior history of lumbar laminectomy and microdiscectomy in 2011 by Dr. Ronnald Ramp. She has had no complications since surgery.  Has been taking goody powder at home without significant relief.  VS stable on arrival.  Past Medical History  Diagnosis Date  . Hypertension   . Asthma    Past Surgical History  Procedure Laterality Date  . Laminectomy and microdiscectomy lumbar spine   06/27/2010  . Tubal ligation Bilateral    Family History  Problem Relation Age of Onset  . Colon cancer Mother   . Colon cancer Sister    History  Substance Use Topics  . Smoking status: Current Every Day Smoker  . Smokeless tobacco: Not on file  . Alcohol Use: No   OB History   Grav Para Term Preterm Abortions TAB SAB Ect Mult Living                 Review of Systems  Musculoskeletal:  Positive for back pain.  All other systems reviewed and are negative.     Allergies  Review of patient's allergies indicates no known allergies.  Home Medications   Prior to Admission medications   Medication Sig Start Date End Date Taking? Authorizing Provider  Aspirin-Acetaminophen-Caffeine (GOODY HEADACHE PO) Take 1 packet by mouth every 6 (six) hours as needed (pains).   Yes Historical Provider, MD  lisinopril-hydrochlorothiazide (PRINZIDE,ZESTORETIC) 20-12.5 MG per tablet Take 1 tablet by mouth daily.   Yes Historical Provider, MD   BP 159/93  Pulse 69  Temp(Src) 98.7 F (37.1 C) (Oral)  Resp 20  SpO2 100%  LMP 12/22/2013  Physical Exam  Nursing note and vitals reviewed. Constitutional: She is oriented to person, place, and time. She appears well-developed and well-nourished. No distress.  HENT:  Head: Normocephalic and atraumatic.  Mouth/Throat: Oropharynx is clear and moist.  Eyes: Conjunctivae and EOM are normal. Pupils are equal, round, and reactive to light.  Neck: Normal range of motion. Neck supple.  Cardiovascular: Normal rate, regular rhythm and normal heart sounds.   Pulmonary/Chest: Effort normal and breath sounds normal. No respiratory distress. She has no wheezes.  Abdominal: Soft. Bowel sounds are normal. There is no tenderness. There is no guarding.  Musculoskeletal: Normal range of motion. She exhibits no edema.       Lumbar back: She exhibits tenderness, bony tenderness and  pain.  Tenderness of left lumbar paraspinal region and SI joint; no midline deformity or step off; limited range of motion secondary to pain; sensation intact to bilateral lower extremities  Neurological: She is alert and oriented to person, place, and time. She has normal strength. No cranial nerve deficit or sensory deficit.  Normal strength BLE; sensation intact diffusely; moving spontaneously without ataxia; ambulating unassisted  Skin: Skin is warm and dry. She is not diaphoretic.   Psychiatric: She has a normal mood and affect.    ED Course  Procedures (including critical care time) Labs Review Labs Reviewed - No data to display  Imaging Review No results found.   EKG Interpretation None      MDM   Final diagnoses:  Back pain   43 year old female presenting to the ED for low back pain, worse along left paraspinal region. She is no focal deficits or red flag sx.  Given prior back surgeries, will obtain plain films of LS.  Percocet given for pain.  X-ray pending at this time.  Care signed out to PA [facility] at shift change who will review films when available.  If negative, pt will be discharged with percocet and close PCP FU, return precautions for new or worsening symptoms including leg numbness, weakness, difficulty walking, etc.  Larene Pickett, PA-C 12/28/13 1558

## 2013-12-28 NOTE — Discharge Instructions (Signed)
Take the prescribed medication as directed-- it may make you drowsy so use caution if driving. Follow-up with your primary care physician. Return to the ED for new or worsening symptoms.

## 2013-12-28 NOTE — ED Provider Notes (Signed)
Medical screening examination/treatment/procedure(s) were performed by non-physician practitioner and as supervising physician I was immediately available for consultation/collaboration.   EKG Interpretation None        Tejal Monroy S Nicolas Banh, MD 12/28/13 2006 

## 2014-12-10 ENCOUNTER — Emergency Department (HOSPITAL_COMMUNITY): Payer: Medicare Other

## 2014-12-10 ENCOUNTER — Emergency Department (HOSPITAL_COMMUNITY)
Admission: EM | Admit: 2014-12-10 | Discharge: 2014-12-10 | Disposition: A | Discharge status: Home / Self Care | Payer: Medicare Other | Attending: Emergency Medicine | Admitting: Emergency Medicine

## 2014-12-10 ENCOUNTER — Encounter (HOSPITAL_COMMUNITY): Payer: Self-pay | Admitting: Cardiology

## 2014-12-10 DIAGNOSIS — I10 Essential (primary) hypertension: Secondary | ICD-10-CM | POA: Diagnosis not present

## 2014-12-10 DIAGNOSIS — J988 Other specified respiratory disorders: Secondary | ICD-10-CM

## 2014-12-10 DIAGNOSIS — J45901 Unspecified asthma with (acute) exacerbation: Secondary | ICD-10-CM | POA: Diagnosis not present

## 2014-12-10 DIAGNOSIS — J069 Acute upper respiratory infection, unspecified: Secondary | ICD-10-CM | POA: Diagnosis not present

## 2014-12-10 DIAGNOSIS — B9789 Other viral agents as the cause of diseases classified elsewhere: Secondary | ICD-10-CM

## 2014-12-10 DIAGNOSIS — R05 Cough: Secondary | ICD-10-CM | POA: Diagnosis present

## 2014-12-10 DIAGNOSIS — Z720 Tobacco use: Secondary | ICD-10-CM | POA: Diagnosis not present

## 2014-12-10 LAB — I-STAT CHEM 8, ED
BUN: 7 mg/dL (ref 6–23)
CALCIUM ION: 1.06 mmol/L — AB (ref 1.12–1.23)
CREATININE: 0.8 mg/dL (ref 0.50–1.10)
Chloride: 107 mmol/L (ref 96–112)
GLUCOSE: 101 mg/dL — AB (ref 70–99)
HCT: 38 % (ref 36.0–46.0)
Hemoglobin: 12.9 g/dL (ref 12.0–15.0)
POTASSIUM: 3.2 mmol/L — AB (ref 3.5–5.1)
Sodium: 142 mmol/L (ref 135–145)
TCO2: 18 mmol/L (ref 0–100)

## 2014-12-10 LAB — I-STAT TROPONIN, ED: Troponin i, poc: 0 ng/mL (ref 0.00–0.08)

## 2014-12-10 MED ORDER — IBUPROFEN 600 MG PO TABS: 600.0000 mg | ORAL_TABLET | Freq: Four times a day (QID) | ORAL | Status: DC | PRN

## 2014-12-10 MED ORDER — HYDROCODONE-HOMATROPINE 5-1.5 MG/5ML PO SYRP: 5.0000 mL | ORAL_SOLUTION | Freq: Four times a day (QID) | ORAL | Status: DC | PRN

## 2014-12-10 MED ORDER — KETOROLAC TROMETHAMINE 30 MG/ML IJ SOLN
30.0000 mg | Freq: Once | INTRAMUSCULAR | Status: AC
  Administered 2014-12-10: 30 mg via INTRAMUSCULAR

## 2014-12-10 MED ORDER — IPRATROPIUM-ALBUTEROL 0.5-2.5 (3) MG/3ML IN SOLN
3.0000 mL | Freq: Once | RESPIRATORY_TRACT | Status: AC
  Administered 2014-12-10: 3 mL via RESPIRATORY_TRACT
  Filled 2014-12-10: qty 3

## 2014-12-10 MED ORDER — ALBUTEROL SULFATE HFA 108 (90 BASE) MCG/ACT IN AERS: 2.0000 | INHALATION_SPRAY | Freq: Four times a day (QID) | RESPIRATORY_TRACT | Status: DC | PRN

## 2014-12-10 MED ORDER — HYDROCODONE-HOMATROPINE 5-1.5 MG/5ML PO SYRP
5.0000 mL | ORAL_SOLUTION | Freq: Once | ORAL | Status: AC
  Administered 2014-12-10: 5 mL via ORAL
  Filled 2014-12-10: qty 5

## 2014-12-10 MED ORDER — KETOROLAC TROMETHAMINE 30 MG/ML IJ SOLN
30.0000 mg | Freq: Once | INTRAMUSCULAR | Status: DC
  Filled 2014-12-10: qty 1

## 2014-12-10 NOTE — ED Provider Notes (Signed)
CSN: [id number]     Arrival date & time 12/10/14  0919 History   First MD Initiated Contact with Patient 12/10/14 [phone]     Chief Complaint  Patient presents with  . Cough  . Headache     (Consider location/radiation/quality/duration/timing/severity/associated sxs/prior Treatment) HPI Comments: Patient is a 44 year old female past medical history significant for asthma, hypertension presenting to the emergency department for 3 days of worsening productive cough with associated nasal congestion, post tussive chest tightness, posttussive shortness of breath, post tussive emesis 1, generalized headache. She states she's tried taking a BC powder with no improvement. Denies any sick contacts at home, endorses sick contacts at work. No modifying factors identified.   Past Medical History  Diagnosis Date  . Hypertension   . Asthma    Past Surgical History  Procedure Laterality Date  . Laminectomy and microdiscectomy lumbar spine   06/27/2010  . Tubal ligation Bilateral    Family History  Problem Relation Age of Onset  . Colon cancer Mother   . Colon cancer Sister    History  Substance Use Topics  . Smoking status: Current Every Day Smoker  . Smokeless tobacco: Not on file  . Alcohol Use: No   OB History    No data available     Review of Systems  HENT: Positive for congestion.   Respiratory: Positive for cough, chest tightness and shortness of breath.   All other systems reviewed and are negative.     Allergies  Review of patient's allergies indicates no known allergies.  Home Medications   Prior to Admission medications   Medication Sig Start Date End Date Taking? Authorizing Provider  Aspirin-Caffeine 845-65 MG PACK Take 1 packet by mouth daily as needed.   Yes Historical Provider, MD  albuterol (PROVENTIL HFA;VENTOLIN HFA) 108 (90 BASE) MCG/ACT inhaler Inhale 2 puffs into the lungs every 6 (six) hours as needed for wheezing or shortness of breath. 12/10/14   Teigan Manner, PA-C  HYDROcodone-homatropine (HYCODAN) 5-1.5 MG/5ML syrup Take 5 mLs by mouth every 6 (six) hours as needed. 12/10/14   Belynda Pagaduan, PA-C  ibuprofen (ADVIL,MOTRIN) 600 MG tablet Take 1 tablet (600 mg total) by mouth every 6 (six) hours as needed. 12/10/14   Kaydince Towles, PA-C  oxyCODONE-acetaminophen (PERCOCET/ROXICET) 5-325 MG per tablet Take 1 tablet by mouth every 4 (four) hours as needed. Patient not taking: Reported on 12/10/2014 12/28/13   Larene Pickett, PA-C   BP 149/83 mmHg  Pulse 65  Temp(Src) 99.1 F (37.3 C) (Oral)  Resp 21  SpO2 99%  LMP 12/08/2014 Physical Exam  Constitutional: She is oriented to person, place, and time. She appears well-developed and well-nourished. No distress.  HENT:  Head: Normocephalic and atraumatic.  Right Ear: External ear normal.  Left Ear: External ear normal.  Nose: Right sinus exhibits frontal sinus tenderness. Left sinus exhibits frontal sinus tenderness.  Mouth/Throat: Oropharynx is clear and moist. No oropharyngeal exudate.  Nasal congestion  Eyes: Conjunctivae are normal.  Neck: Neck supple.  Cardiovascular: Normal rate, regular rhythm and normal heart sounds.   Pulmonary/Chest: Effort normal. No tachypnea. No respiratory distress. She has rhonchi. She exhibits tenderness.  Cough on examination.   Abdominal: Soft. There is no tenderness.  Musculoskeletal: She exhibits no edema.  Neurological: She is alert and oriented to person, place, and time.  Skin: Skin is warm and dry. She is not diaphoretic.  Nursing note and vitals reviewed.   ED Course  Procedures (including critical care time)  Medications  HYDROcodone-homatropine (HYCODAN) 5-1.5 MG/5ML syrup 5 mL (5 mLs Oral Given 12/10/14 1026)  ipratropium-albuterol (DUONEB) 0.5-2.5 (3) MG/3ML nebulizer solution 3 mL (3 mLs Nebulization Given 12/10/14 1026)  ketorolac (TORADOL) 30 MG/ML injection 30 mg (30 mg Intramuscular Given 12/10/14 1134)    Labs  Review Labs Reviewed  I-STAT CHEM 8, ED - Abnormal; Notable for the following:    Potassium 3.2 (*)    Glucose, Bld 101 (*)    Calcium, Ion 1.06 (*)    All other components within normal limits  I-STAT TROPOININ, ED    Imaging Review Dg Chest 2 View  12/10/2014   CLINICAL DATA:  Chest pain and cough since Saturday.  EXAM: CHEST  2 VIEW  COMPARISON:  04/20/2013  FINDINGS: Normal heart size and mediastinal contours. No acute infiltrate or edema. No effusion or pneumothorax. No acute osseous findings.  IMPRESSION: Negative chest.   Electronically Signed   By: Monte Fantasia M.D.   On: 12/10/2014 10:18     EKG Interpretation   Date/Time:  Monday December 10 2014 09:27:17 EDT Ventricular Rate:  72 PR Interval:  170 QRS Duration: 80 QT Interval:  409 QTC Calculation: 448 R Axis:   86 Text Interpretation:  Sinus rhythm Normal ECG No significant change since  last tracing Confirmed by Christy Gentles  MD, DONALD ([id number]) on 12/10/2014  9:49:49 AM      11:19 AM Breathing improved after duoneb administration, CP improved with hycodan.   MDM   Final diagnoses:  Viral respiratory illness   Filed Vitals:   12/10/14 1200  BP: 149/83  Pulse: 65  Temp:   Resp:    Afebrile, NAD, non-toxic appearing, AAOx4. I have reviewed nursing notes, vital signs, and all appropriate lab and imaging results for this patient. Patients symptoms are consistent with URI, likely viral etiology. No hypoxia or fever to suggest pneumonia. No evidence of PNA on CXR. Lungs clear to auscultation bilaterally. perc negative, VSS, no tracheal deviation, no JVD or new murmur, RRR, breath sounds equal bilaterally, EKG without acute abnormalities, negative troponin, and negative CXR.  Discussed that antibiotics are not indicated for viral infections. Pt will be discharged with symptomatic treatment.  Parent verbalizes understanding and is agreeable with plan. Pt is hemodynamically stable at time of discharge.    Baron Sane, PA-C 12/10/14 1221  Ripley Fraise, MD 12/10/14 [phone]

## 2014-12-10 NOTE — ED Notes (Signed)
Pt reports she has had a cough and congestion with chest pain and SOB since Saturday. Also reports a headache. No sick contacts at home. Hx of HTN.

## 2014-12-10 NOTE — Discharge Instructions (Signed)
Please follow up with your primary care physician in 1-2 days. If you do not have one please call the [facility] number listed above. Please take pain medication and/or muscle relaxants as prescribed and as needed for pain. Please do not drive on narcotic pain medication or on muscle relaxants. Please read all discharge instructions and return precautions.   Upper Respiratory Infection, Adult An upper respiratory infection (URI) is also sometimes known as the common cold. The upper respiratory tract includes the nose, sinuses, throat, trachea, and bronchi. Bronchi are the airways leading to the lungs. Most people improve within 1 week, but symptoms can last up to 2 weeks. A residual cough may last even longer.  CAUSES Many different viruses can infect the tissues lining the upper respiratory tract. The tissues become irritated and inflamed and often become very moist. Mucus production is also common. A cold is contagious. You can easily spread the virus to others by oral contact. This includes kissing, sharing a glass, coughing, or sneezing. Touching your mouth or nose and then touching a surface, which is then touched by another person, can also spread the virus. SYMPTOMS  Symptoms typically develop 1 to 3 days after you come in contact with a cold virus. Symptoms vary from person to person. They may include:  Runny nose.  Sneezing.  Nasal congestion.  Sinus irritation.  Sore throat.  Loss of voice (laryngitis).  Cough.  Fatigue.  Muscle aches.  Loss of appetite.  Headache.  Low-grade fever. DIAGNOSIS  You might diagnose your own cold based on familiar symptoms, since most people get a cold 2 to 3 times a year. Your caregiver can confirm this based on your exam. Most importantly, your caregiver can check that your symptoms are not due to another disease such as strep throat, sinusitis, pneumonia, asthma, or epiglottitis. Blood tests, throat tests, and X-rays are  not necessary to diagnose a common cold, but they may sometimes be helpful in excluding other more serious diseases. Your caregiver will decide if any further tests are required. RISKS AND COMPLICATIONS  You may be at risk for a more severe case of the common cold if you smoke cigarettes, have chronic heart disease (such as heart failure) or lung disease (such as asthma), or if you have a weakened immune system. The very young and very old are also at risk for more serious infections. Bacterial sinusitis, middle ear infections, and bacterial pneumonia can complicate the common cold. The common cold can worsen asthma and chronic obstructive pulmonary disease (COPD). Sometimes, these complications can require emergency medical care and may be life-threatening. PREVENTION  The best way to protect against getting a cold is to practice good hygiene. Avoid oral or hand contact with people with cold symptoms. Wash your hands often if contact occurs. There is no clear evidence that vitamin C, vitamin E, echinacea, or exercise reduces the chance of developing a cold. However, it is always recommended to get plenty of rest and practice good nutrition. TREATMENT  Treatment is directed at relieving symptoms. There is no cure. Antibiotics are not effective, because the infection is caused by a virus, not by bacteria. Treatment may include:  Increased fluid intake. Sports drinks offer valuable electrolytes, sugars, and fluids.  Breathing heated mist or steam (vaporizer or shower).  Eating chicken soup or other clear broths, and maintaining good nutrition.  Getting plenty of rest.  Using gargles or lozenges for comfort.  Controlling fevers with ibuprofen or acetaminophen as directed  by your caregiver.  Increasing usage of your inhaler if you have asthma. Zinc gel and zinc lozenges, taken in the first 24 hours of the common cold, can shorten the duration and lessen the severity of symptoms. Pain medicines may  help with fever, muscle aches, and throat pain. A variety of non-prescription medicines are available to treat congestion and runny nose. Your caregiver can make recommendations and may suggest nasal or lung inhalers for other symptoms.  HOME CARE INSTRUCTIONS   Only take over-the-counter or prescription medicines for pain, discomfort, or fever as directed by your caregiver.  Use a warm mist humidifier or inhale steam from a shower to increase air moisture. This may keep secretions moist and make it easier to breathe.  Drink enough water and fluids to keep your urine clear or pale yellow.  Rest as needed.  Return to work when your temperature has returned to normal or as your caregiver advises. You may need to stay home longer to avoid infecting others. You can also use a face mask and careful hand washing to prevent spread of the virus. SEEK MEDICAL CARE IF:   After the first few days, you feel you are getting worse rather than better.  You need your caregiver's advice about medicines to control symptoms.  You develop chills, worsening shortness of breath, or brown or red sputum. These may be signs of pneumonia.  You develop yellow or brown nasal discharge or pain in the face, especially when you bend forward. These may be signs of sinusitis.  You develop a fever, swollen neck glands, pain with swallowing, or white areas in the back of your throat. These may be signs of strep throat. SEEK IMMEDIATE MEDICAL CARE IF:   You have a fever.  You develop severe or persistent headache, ear pain, sinus pain, or chest pain.  You develop wheezing, a prolonged cough, cough up blood, or have a change in your usual mucus (if you have chronic lung disease).  You develop sore muscles or a stiff neck. Document Released: 01/27/2001 Document Revised: 10/26/2011 Document Reviewed: 11/08/2013 [facility] Patient Information 2015 [place], [place]. This information is not intended to replace advice given to  you by your health care provider. Make sure you discuss any questions you have with your health care provider.

## 2015-06-29 ENCOUNTER — Encounter (HOSPITAL_COMMUNITY): Payer: Self-pay | Admitting: Nurse Practitioner

## 2015-06-29 ENCOUNTER — Emergency Department (HOSPITAL_COMMUNITY): Payer: Medicare Other

## 2015-06-29 ENCOUNTER — Emergency Department (HOSPITAL_COMMUNITY)
Admission: EM | Admit: 2015-06-29 | Discharge: 2015-06-29 | Disposition: A | Discharge status: Home / Self Care | Payer: Medicare Other | Attending: Emergency Medicine | Admitting: Emergency Medicine

## 2015-06-29 DIAGNOSIS — Z79899 Other long term (current) drug therapy: Secondary | ICD-10-CM | POA: Insufficient documentation

## 2015-06-29 DIAGNOSIS — B349 Viral infection, unspecified: Secondary | ICD-10-CM | POA: Insufficient documentation

## 2015-06-29 DIAGNOSIS — M25559 Pain in unspecified hip: Secondary | ICD-10-CM | POA: Insufficient documentation

## 2015-06-29 DIAGNOSIS — G8929 Other chronic pain: Secondary | ICD-10-CM | POA: Diagnosis not present

## 2015-06-29 DIAGNOSIS — M545 Low back pain, unspecified: Secondary | ICD-10-CM

## 2015-06-29 DIAGNOSIS — I10 Essential (primary) hypertension: Secondary | ICD-10-CM | POA: Insufficient documentation

## 2015-06-29 DIAGNOSIS — R079 Chest pain, unspecified: Secondary | ICD-10-CM | POA: Diagnosis present

## 2015-06-29 DIAGNOSIS — J45901 Unspecified asthma with (acute) exacerbation: Secondary | ICD-10-CM | POA: Insufficient documentation

## 2015-06-29 DIAGNOSIS — K5900 Constipation, unspecified: Secondary | ICD-10-CM | POA: Insufficient documentation

## 2015-06-29 DIAGNOSIS — R630 Anorexia: Secondary | ICD-10-CM | POA: Insufficient documentation

## 2015-06-29 LAB — BASIC METABOLIC PANEL
Anion gap: 6 (ref 5–15)
BUN: 8 mg/dL (ref 6–20)
CHLORIDE: 109 mmol/L (ref 101–111)
CO2: 21 mmol/L — ABNORMAL LOW (ref 22–32)
Calcium: 8.7 mg/dL — ABNORMAL LOW (ref 8.9–10.3)
Creatinine, Ser: 0.78 mg/dL (ref 0.44–1.00)
GFR calc Af Amer: >60 mL/min (ref >60)
GFR calc non Af Amer: >60 mL/min (ref >60)
GLUCOSE: 92 mg/dL (ref 65–99)
POTASSIUM: 3.9 mmol/L (ref 3.5–5.1)
Sodium: 136 mmol/L (ref 135–145)

## 2015-06-29 LAB — CBC
HEMATOCRIT: 38.8 % (ref 36.0–46.0)
Hemoglobin: 13 g/dL (ref 12.0–15.0)
MCH: 29.7 pg (ref 26.0–34.0)
MCHC: 33.5 g/dL (ref 30.0–36.0)
MCV: 88.8 fL (ref 78.0–100.0)
Platelets: 284 10*3/uL (ref 150–400)
RBC: 4.37 MIL/uL (ref 3.87–5.11)
RDW: 14.7 % (ref 11.5–15.5)
WBC: 7.4 10*3/uL (ref 4.0–10.5)

## 2015-06-29 LAB — I-STAT TROPONIN, ED: Troponin i, poc: 0 ng/mL (ref 0.00–0.08)

## 2015-06-29 MED ORDER — BENZONATATE 100 MG PO CAPS: 100.0000 mg | ORAL_CAPSULE | Freq: Three times a day (TID) | ORAL | Status: DC | PRN

## 2015-06-29 MED ORDER — ALBUTEROL SULFATE (2.5 MG/3ML) 0.083% IN NEBU
2.5000 mg | INHALATION_SOLUTION | Freq: Once | RESPIRATORY_TRACT | Status: AC
  Administered 2015-06-29: 2.5 mg via RESPIRATORY_TRACT
  Filled 2015-06-29: qty 3

## 2015-06-29 MED ORDER — SODIUM CHLORIDE 0.9 % IV BOLUS (SEPSIS)
500.0000 mL | Freq: Once | INTRAVENOUS | Status: AC
  Administered 2015-06-29: 500 mL via INTRAVENOUS

## 2015-06-29 MED ORDER — HYDROCODONE-HOMATROPINE 5-1.5 MG/5ML PO SYRP
5.0000 mL | ORAL_SOLUTION | Freq: Once | ORAL | Status: AC
  Administered 2015-06-29: 5 mL via ORAL
  Filled 2015-06-29: qty 5

## 2015-06-29 NOTE — ED Notes (Signed)
Pt c/o CP due to cough, pain reproducible with coughing.

## 2015-06-29 NOTE — Discharge Instructions (Signed)
1. Medications: Tessalon, continue usual home medications 2. Treatment: rest, drink plenty of fluids, take tylenol or ibuprofen for fever control 3. Follow Up: Please followup with your primary doctor in 3 days for discussion of your diagnoses and further evaluation after today's visit; if you do not have a primary care doctor use the resource guide provided to find one; Return to the ER for high fevers, difficulty breathing or other concerning symptoms   Emergency Department Resource Guide 1) Find a Doctor and Pay Out of Pocket Although you won't have to find out who is covered by your insurance plan, it is a good idea to ask around and get recommendations. You will then need to call the office and see if the doctor you have chosen will accept you as a new patient and what types of options they offer for patients who are self-pay. Some doctors offer discounts or will set up payment plans for their patients who do not have insurance, but you will need to ask so you aren't surprised when you get to your appointment.  2) Contact Your Local Health Department Not all health departments have doctors that can see patients for sick visits, but many do, so it is worth a call to see if yours does. If you don't know where your local health department is, you can check in your phone book. The CDC also has a tool to help you locate your state's health department, and many state websites also have listings of all of their local health departments.  3) Find a [facility] If your illness is not likely to be very severe or complicated, you may want to try a walk in clinic. These are popping up all over the country in pharmacies, drugstores, and shopping centers. They're usually staffed by nurse practitioners or physician assistants that have been trained to treat common illnesses and complaints. They're usually fairly quick and inexpensive. However, if you have serious medical issues or chronic medical problems,  these are probably not your best option.  No Primary Care Doctor: - Call Health Connect at  [phone] - they can help you locate a primary care doctor that  accepts your insurance, provides certain services, etc. - Physician Referral Service- [phone]  Chronic Pain Problems: Organization         Address  Phone   Notes  [facility]  [phone] Patients need to be referred by their primary care doctor.   Medication Assistance: Organization         Address  Phone   Notes  [facility] Medication [facility] [address] [place], [place] [address] [phone] --Must be a resident of [facility] -- Must have NO insurance coverage whatsoever (no Medicaid/ Medicare, etc.) -- The pt. MUST have a primary care doctor that directs their care regularly and follows them in the community   Med[facility]ist  [phone]   [employer]  [phone]    Agencies that provide inexpensive medical care: Organization         Address  Phone   Notes  [place]  [phone]   [facility]    [phone]   [facility] [place], [place] [address] [phone]   [place] [address], [place] [phone]   Planned Parenthood    [phone]   [facility]    [phone]  [facility] [place]  [address], [place] Phone:  [phone], Fax:  [phone] Hours of Operation:  9 am - 6 pm, M-F.  Also accepts Medicaid/Medicare and self-pay.  [facility] [place] [address], [address], [place] Phone: [phone], Fax: [phone]. Hours of Operation:  8:30 am - 5:30 pm, M-F.  Also accepts Medicaid and self-pay.  [facility] [address] [place] Phone: [phone]   [place], [place], [place] [phone], Ext. [phone] Mondays &  Thursdays: 7-9 AM.  First 15 patients are seen on a first come, first serve basis.    [place] Providers:  Organization         Address  Phone   Notes  [facility] [address], [place] [phone] Also accepts self-pay patients.  [facility] [address], [place]  [phone]   [place], [address], [place] [phone]   [facility] [address], [place] [phone]   Lucianne Lei [address], [place]   [phone] Only accepts [place] Access [place] patients after they have their name applied to their card.   Self-Pay (no insurance) in [facility]:  Organization         Address  Phone   Notes  Sickle Cell Patients, [facility] [place] [phone]   [facility] [place] [phone]   [facility] [place]  [place], [place], [place] [phone]   [facility]/Dr. Osei-Bonsu  [address] [place] or [address], [place] [phone] Phone number for both [place] and [place] locations is the same.  [facility] [address] [place] [phone]   [facility] [address] [place] or [address] [phone]   [facility] [address] [place] [phone], phone; [phone], fax Sees patients 1st and 3rd Saturday of every month.  Must not qualify for public or private insurance (i.e. Medicaid, Medicare, [place] Health Choice, Veterans' Benefits)  Household income should be no more than 200% of the poverty level The clinic cannot treat you if you are pregnant or think you are pregnant  Sexually transmitted diseases are not treated at the  clinic.    Dental Care: Organization         Address  Phone  Notes  [facility] Department of [facility] [place] [phone] Accepts children up to age 19 who are enrolled in [place] or [place]; pregnant women with a Medicaid card; and children who have applied for Medicaid or [place] Health Choice, but were declined, whose parents can pay a reduced fee at time of service.  [facility] Department of [facility]  [address], [place] [phone] Accepts children up to age 62 who are enrolled in [place] or [place]; pregnant women with a Medicaid card; and children who have applied for Medicaid or [place] Health Choice, but were declined, whose parents can pay a reduced fee at time of service.  [place]  [phone]  [place] [phone] Patients are seen by appointment only. Walk-ins are not accepted. [place] will see patients 46 years of age and older. Monday - Tuesday (8am-5pm) Most Wednesdays (8:30-5pm) $30 per visit, cash only  [facility] Adult Dental Access PROGRAM  [address], [facility] [phone] Patients are seen by appointment only. Walk-ins are not accepted. [place] will see patients 10 years of age and older. One Wednesday Evening (Monthly: Volunteer Based).  $30 per visit, cash only  [place]  [phone] for adults; Children under age 30, call Graduate Pediatric Dentistry at [phone]. Children aged 7-14, please call [phone] to request a pediatric application.  Dental services are provided in all areas of dental care including fillings, crowns and bridges, complete and partial dentures, implants, gum treatment, root canals, and extractions. Preventive care is also provided. Treatment is provided to both adults and children. Patients are selected via a lottery and there is often a  waiting list.   [facility] [address], [place]  [phone] [email or web address]   [facility] [address] [place], [place] [phone], Ext. [phone] Second and Fourth Thursday of each month, opens at 6:30 AM; Clinic ends at 9 AM.  Patients are seen on a first-come first-served basis, and a limited number are seen during each clinic.   [facility]  [address] Hillard Danker [place], [place] [phone]   Eligibility Requirements You must have lived in [place], [place], or [place] counties for at least the last three months.   You cannot be eligible for state or federal sponsored [employer], including [employer], [place], or [employer].   You generally cannot be eligible for healthcare insurance through your employer.    How to apply: Eligibility screenings are held every Tuesday and Wednesday afternoon from 1:00 pm until 4:00 pm. You do not need an appointment for the interview!  [facility] [address] [place], [place]   [place]  [place] Department  [place]  [phone]    Behavioral Health Resources in the Community: Intensive Outpatient Programs Organization         Address  Phone  Notes  [place] [place], [place] [phone]   [facility] [address] [place], [place]   ADS: [facility] Drug [facility] [address] [place], [place]   [place] [address]  [place], [place] or [phone]   Substance Abuse Resources Organization         Address  Phone  Notes  Alcohol and Drug Services  [phone]   [place]  [phone]   [facility] [place]   Chinita Pester  [phone]   Residential & Outpatient Substance Abuse  Program  [phone]   Psychological Services Organization         Address  Phone  Notes  [facility] [place]  [place]  [phone]   [place] [address], [place] or [phone]    Mobile Crisis Teams Organization         Address  Phone  Notes  [facility], Mobile Crisis Care Unit  [phone]   [facility] Psychotherapeutic [facility]  [address] [place], [place]  [facility] [address], Ste [place] [phone]    Self-Help/Support Groups Organization         Address  Phone             Notes  [facility] of [place] - variety of support groups  [place] Call for more information  Narcotics Anonymous (NA), [facility] [address], [employer] [place]  2 meetings at this location   Special educational needs teacher         Address  Phone  Notes  [facility]idential [facility] [place],    [place]  [phone]   [facility]  [address] [place] [address], [place], [place]   [place], [place] [phone] Admissions: 8am-3pm M-F  Incentives Substance [place] [address],    [place], [place] [phone]   [facility] [address] [place], [place], [place]   [facility] [address]  [place], [place]   [facility] - Intensive Outpatient [address] Kristeen [address], [place], [place]   [facility] ([place].,  [place], [place] [phone] or [phone]   [facility] (RTS) [address], [place], [place] Accepts Medicaid  [facility] [place] [address]  [place] [phone] Substance Abuse/Addiction Treatment   [facility] Organization          Address  Phone  Notes  [facility]  [phone]   Domenic Schwab, PhD [address] [place], [place]   2818698019 or [phone]   [place], [place] [phone]   [facility] [address] [place], [place] [phone] Insurance/Medicaid/sponsorship through [facility] Families [address] [place]                                    [place], [place] [phone] [place] [address], [place] [phone]    Dr. Adele Schilder  [phone]   [facility] Dept. 1) [address] [place] 2) [place] 3)  [place] [address], [place] [phone]  [phone]   [place] [phone] or [phone] (After Hours)

## 2015-06-29 NOTE — ED Provider Notes (Signed)
CSN: [id number]     Arrival date & time 06/29/15  1216 History   First MD Initiated Contact with Patient 06/29/15 1258     Chief Complaint  Patient presents with  . Chest Pain  . Back Pain  . Hip Pain     (Consider location/radiation/quality/duration/timing/severity/associated sxs/prior Treatment) The history is provided by the patient and medical records. No language interpreter was used.   Patricia Davila is a 44 y.o. female  with a PMH of HTN, asthma presents to the Emergency Department complaining of non productive, hacking cough x 3-4 days. Patient admits to chest pain that does not radiate and is worse with breathing and coughing. Other associated symptoms include post-tussive emesis, headaches. No alleviating factors noted - tried nyquil with no relief. Grandson and husband have had similar sxs. Subjective fever last night.    Past Medical History  Diagnosis Date  . Hypertension   . Asthma    Past Surgical History  Procedure Laterality Date  . Laminectomy and microdiscectomy lumbar spine   06/27/2010  . Tubal ligation Bilateral    Family History  Problem Relation Age of Onset  . Colon cancer Mother   . Colon cancer Sister    Social History  Substance Use Topics  . Smoking status: Current Every Day Smoker  . Smokeless tobacco: None  . Alcohol Use: No   OB History    No data available     Review of Systems  Constitutional: Positive for fever (Subjective last night) and appetite change (Decreased). Negative for chills.  HENT: Negative for congestion, rhinorrhea and sore throat.   Eyes: Negative for visual disturbance.  Respiratory: Positive for cough, chest tightness and shortness of breath. Negative for wheezing.   Cardiovascular: Positive for chest pain. Negative for palpitations and leg swelling.  Gastrointestinal: Positive for constipation. Negative for nausea, vomiting, abdominal pain and diarrhea.  Genitourinary: Negative for dysuria, urgency and frequency.   Musculoskeletal: Positive for back pain (Chronic) and arthralgias (Chronic hip pain). Negative for myalgias and neck pain.  Skin: Negative for rash.  Neurological: Negative for dizziness, weakness and headaches.      Allergies  Ativan and Tylenol with codeine #3  Home Medications   Prior to Admission medications   Medication Sig Start Date End Date Taking? Authorizing Provider  albuterol (PROVENTIL HFA;VENTOLIN HFA) 108 (90 BASE) MCG/ACT inhaler Inhale 2 puffs into the lungs every 6 (six) hours as needed for wheezing or shortness of breath. 12/10/14  Yes Jennifer Piepenbrink, PA-C  benzonatate (TESSALON) 100 MG capsule Take 1 capsule (100 mg total) by mouth 3 (three) times daily as needed for cough. 06/29/15   Jaime Pilcher Ward, PA-C   BP 173/102 mmHg  Pulse 60  Temp(Src) 98.3 F (36.8 C) (Oral)  Resp 16  Ht 5\' 1"  (1.549 m)  Wt 187 lb (84.823 kg)  BMI 35.35 kg/m2  SpO2 100% Physical Exam  Constitutional: She is oriented to person, place, and time. She appears well-developed and well-nourished.  Alert and in no acute distress  HENT:  Head: Normocephalic and atraumatic.  Nose: No mucosal edema or rhinorrhea. Right sinus exhibits no maxillary sinus tenderness and no frontal sinus tenderness. Left sinus exhibits no maxillary sinus tenderness and no frontal sinus tenderness.  Mouth/Throat: Uvula is midline and oropharynx is clear and moist. Mucous membranes are not dry. No oropharyngeal exudate, posterior oropharyngeal edema or posterior oropharyngeal erythema.  Cardiovascular: Normal rate, regular rhythm, normal heart sounds and intact distal pulses.  Exam reveals no  gallop and no friction rub.   No murmur heard. Pulmonary/Chest: Effort normal. No respiratory distress. She has wheezes. She has no rales. She exhibits tenderness.  Diminished breath sounds bilaterally.   Abdominal: She exhibits no mass. There is no rebound and no guarding.  Abdomen soft, non-tender,  non-distended Bowel sounds positive in all four quadrants   Musculoskeletal: She exhibits no edema.  TTP of lumbar paraspinal musculature   Neurological: She is alert and oriented to person, place, and time.  Skin: Skin is warm and dry. No rash noted. She is not diaphoretic.  Psychiatric: She has a normal mood and affect. Her behavior is normal. Judgment and thought content normal.  Nursing note and vitals reviewed.   ED Course  Procedures (including critical care time) Labs Review Labs Reviewed  BASIC METABOLIC PANEL - Abnormal; Notable for the following:    CO2 21 (*)    Calcium 8.7 (*)    All other components within normal limits  CBC  I-STAT TROPOININ, ED    Imaging Review Dg Chest 2 View  06/29/2015  CLINICAL DATA:  Low back pain for 4 months. Right-sided hip pain. Centralized chest pain and dry cough for 4 days. Fever yesterday. EXAM: CHEST  2 VIEW COMPARISON:  None. FINDINGS: Cardiac silhouette is top-normal in size. Normal mediastinal and hilar contours. Clear lungs.  No pleural effusion or pneumothorax. Skeletal structures are unremarkable. IMPRESSION: No active cardiopulmonary disease. Electronically Signed   By: Lajean Manes M.D.   On: 06/29/2015 13:23   I have personally reviewed and evaluated these images and lab results as part of my medical decision-making.   EKG Interpretation None      MDM   Final diagnoses:  Viral syndrome  Bilateral low back pain without sciatica  Patricia Davila presents with cough and pleuritic chest pain x 4 days. + Sick contacts.  CXR shows no acute cardio pulm dz: r/o PNA, pneumo.  Labs are reassuring, nl white count. O2 sats 97-100%, no fever, PERC negative Likely viral syndrome.  High BP noticed - Patient states she is non-compliant with BP meds because they make her "feel off" - Stressed importance of PCP follow up and getting BP under control.   3:15 PM - Patient re-evaluated, no longer wheezing, lungs are clear bilaterally;  chest pain improved.   Will discharge home with symptomatic care instructions.   Filed Vitals:   06/29/15 1445  BP: 173/102  Pulse: 60  Temp:   Resp: [id number] Ward, PA-C 06/29/15 1516  Gareth Morgan, MD 07/01/15 2240

## 2015-06-29 NOTE — ED Notes (Addendum)
She c/o productive cough, soreness and aching in chest, fevers, SOB  x 3 days. Her family member was sick she thinks she may have caught their cold. She also c/o lower back and r hip pain that is chronic but worse this week, she tried [facility] with no relief. She reports shes been off her BP meds "for a while" because she didn't feel like going to the doctor

## 2016-12-01 ENCOUNTER — Ambulatory Visit (INDEPENDENT_AMBULATORY_CARE_PROVIDER_SITE_OTHER): Payer: Medicare Other | Admitting: Physician Assistant

## 2016-12-02 ENCOUNTER — Ambulatory Visit (INDEPENDENT_AMBULATORY_CARE_PROVIDER_SITE_OTHER): Payer: Medicare Other | Admitting: Physician Assistant

## 2016-12-14 ENCOUNTER — Ambulatory Visit (INDEPENDENT_AMBULATORY_CARE_PROVIDER_SITE_OTHER): Payer: Medicare Other | Admitting: Physician Assistant

## 2016-12-14 ENCOUNTER — Encounter (INDEPENDENT_AMBULATORY_CARE_PROVIDER_SITE_OTHER): Payer: Self-pay | Admitting: Physician Assistant

## 2016-12-14 VITALS — BP 143/99 | HR 74 | Temp 97.9°F | Ht 61.0 in | Wt 186.8 lb

## 2016-12-14 DIAGNOSIS — M25552 Pain in left hip: Secondary | ICD-10-CM | POA: Diagnosis not present

## 2016-12-14 DIAGNOSIS — G43919 Migraine, unspecified, intractable, without status migrainosus: Secondary | ICD-10-CM

## 2016-12-14 DIAGNOSIS — I10 Essential (primary) hypertension: Secondary | ICD-10-CM

## 2016-12-14 DIAGNOSIS — K219 Gastro-esophageal reflux disease without esophagitis: Secondary | ICD-10-CM | POA: Diagnosis not present

## 2016-12-14 MED ORDER — OMEPRAZOLE 40 MG PO CPDR: 40.0000 mg | DELAYED_RELEASE_CAPSULE | Freq: Every day | ORAL | 3 refills | Status: DC

## 2016-12-14 MED ORDER — LOSARTAN POTASSIUM 50 MG PO TABS: 50.0000 mg | ORAL_TABLET | Freq: Every day | ORAL | 3 refills | Status: DC

## 2016-12-14 MED ORDER — GABAPENTIN 300 MG PO CAPS: 300.0000 mg | ORAL_CAPSULE | Freq: Three times a day (TID) | ORAL | 3 refills | Status: DC

## 2016-12-14 MED ORDER — TOPIRAMATE 25 MG PO TABS: 25.0000 mg | ORAL_TABLET | Freq: Two times a day (BID) | ORAL | 2 refills | Status: DC

## 2016-12-14 MED ORDER — HYDROCHLOROTHIAZIDE 25 MG PO TABS
25.0000 mg | ORAL_TABLET | Freq: Every day | ORAL | 1 refills | Status: DC
Start: 2016-12-14 — End: 2017-01-19

## 2016-12-14 NOTE — Patient Instructions (Signed)
Hip Pain The hip is the joint between the upper legs and the lower pelvis. The bones, cartilage, tendons, and muscles of your hip joint support your body and allow you to move around. Hip pain can range from a minor ache to severe pain in one or both of your hips. The pain may be felt on the inside of the hip joint near the groin, or the outside near the buttocks and upper thigh. You may also have swelling or stiffness. Follow these instructions at home: Managing pain, stiffness, and swelling   If directed, apply ice to the injured area.  Put ice in a plastic bag.  Place a towel between your skin and the bag.  Leave the ice on for 20 minutes, 2-3 times a day  Sleep with a pillow between your legs on your most comfortable side.  Avoid any activities that cause pain. General instructions   Take over-the-counter and prescription medicines only as told by your health care provider.  Do any exercises as told by your health care provider.  Record the following:  How often you have hip pain.  The location of your pain.  What the pain feels like.  What makes the pain worse.  Keep all follow-up visits as told by your health care provider. This is important. Contact a health care provider if:  You cannot put weight on your leg.  Your pain or swelling continues or gets worse after one week.  It gets harder to walk.  You have a fever. Get help right away if:  You fall.  You have a sudden increase in pain and swelling in your hip.  Your hip is red or swollen or very tender to touch. Summary  Hip pain can range from a minor ache to severe pain in one or both of your hips.  The pain may be felt on the inside of the hip joint near the groin, or the outside near the buttocks and upper thigh.  Avoid any activities that cause pain.  Record how often you have hip pain, the location of the pain, what makes it worse and what it feels like. This information is not intended to  replace advice given to you by your health care provider. Make sure you discuss any questions you have with your health care provider. Document Released: 01/21/2010 Document Revised: 07/06/2016 Document Reviewed: 07/06/2016 Elsevier Interactive Patient Education  2017 [employer] Hypertension Hypertension, commonly called high blood pressure, is when the force of blood pumping through the arteries is too strong. The arteries are the blood vessels that carry blood from the heart throughout the body. Hypertension forces the heart to work harder to pump blood and may cause arteries to become narrow or stiff. Having untreated or uncontrolled hypertension can cause heart attacks, strokes, kidney disease, and other problems. A blood pressure reading consists of a higher number over a lower number. Ideally, your blood pressure should be below 120/80. The first ("top") number is called the systolic pressure. It is a measure of the pressure in your arteries as your heart beats. The second ("bottom") number is called the diastolic pressure. It is a measure of the pressure in your arteries as the heart relaxes. What are the causes? The cause of this condition is not known. What increases the risk? Some risk factors for high blood pressure are under your control. Others are not. Factors you can change   Smoking.  Having type 2 diabetes mellitus, high cholesterol, or both.  Not  getting enough exercise or physical activity.  Being overweight.  Having too much fat, sugar, calories, or salt (sodium) in your diet.  Drinking too much alcohol. Factors that are difficult or impossible to change   Having chronic kidney disease.  Having a family history of high blood pressure.  Age. Risk increases with age.  Race. You may be at higher risk if you are African-American.  Gender. Men are at higher risk than women before age 31. After age 45, women are at higher risk than men.  Having obstructive sleep  apnea.  Stress. What are the signs or symptoms? Extremely high blood pressure (hypertensive crisis) may cause:  Headache.  Anxiety.  Shortness of breath.  Nosebleed.  Nausea and vomiting.  Severe chest pain.  Jerky movements you cannot control (seizures). How is this diagnosed? This condition is diagnosed by measuring your blood pressure while you are seated, with your arm resting on a surface. The cuff of the blood pressure monitor will be placed directly against the skin of your upper arm at the level of your heart. It should be measured at least twice using the same arm. Certain conditions can cause a difference in blood pressure between your right and left arms. Certain factors can cause blood pressure readings to be lower or higher than normal (elevated) for a short period of time:  When your blood pressure is higher when you are in a health care provider's office than when you are at home, this is called white coat hypertension. Most people with this condition do not need medicines.  When your blood pressure is higher at home than when you are in a health care provider's office, this is called masked hypertension. Most people with this condition may need medicines to control blood pressure. If you have a high blood pressure reading during one visit or you have normal blood pressure with other risk factors:  You may be asked to return on a different day to have your blood pressure checked again.  You may be asked to monitor your blood pressure at home for 1 week or longer. If you are diagnosed with hypertension, you may have other blood or imaging tests to help your health care provider understand your overall risk for other conditions. How is this treated? This condition is treated by making healthy lifestyle changes, such as eating healthy foods, exercising more, and reducing your alcohol intake. Your health care provider may prescribe medicine if lifestyle changes are not  enough to get your blood pressure under control, and if:  Your systolic blood pressure is above 130.  Your diastolic blood pressure is above 80. Your personal target blood pressure may vary depending on your medical conditions, your age, and other factors. Follow these instructions at home: Eating and drinking   Eat a diet that is high in fiber and potassium, and low in sodium, added sugar, and fat. An example eating plan is called the DASH (Dietary Approaches to Stop Hypertension) diet. To eat this way:  Eat plenty of fresh fruits and vegetables. Try to fill half of your plate at each meal with fruits and vegetables.  Eat whole grains, such as whole wheat pasta, brown rice, or whole grain bread. Fill about one quarter of your plate with whole grains.  Eat or drink low-fat dairy products, such as skim milk or low-fat yogurt.  Avoid fatty cuts of meat, processed or cured meats, and poultry with skin. Fill about one quarter of your plate with lean proteins,  such as fish, chicken without skin, beans, eggs, and tofu.  Avoid premade and processed foods. These tend to be higher in sodium, added sugar, and fat.  Reduce your daily sodium intake. Most people with hypertension should eat less than 1,500 mg of sodium a day.  Limit alcohol intake to no more than 1 drink a day for nonpregnant women and 2 drinks a day for men. One drink equals 12 oz of beer, 5 oz of wine, or 1 oz of hard liquor. Lifestyle   Work with your health care provider to maintain a healthy body weight or to lose weight. Ask what an ideal weight is for you.  Get at least 30 minutes of exercise that causes your heart to beat faster (aerobic exercise) most days of the week. Activities may include walking, swimming, or biking.  Include exercise to strengthen your muscles (resistance exercise), such as pilates or lifting weights, as part of your weekly exercise routine. Try to do these types of exercises for 30 minutes at least 3  days a week.  Do not use any products that contain nicotine or tobacco, such as cigarettes and e-cigarettes. If you need help quitting, ask your health care provider.  Monitor your blood pressure at home as told by your health care provider.  Keep all follow-up visits as told by your health care provider. This is important. Medicines   Take over-the-counter and prescription medicines only as told by your health care provider. Follow directions carefully. Blood pressure medicines must be taken as prescribed.  Do not skip doses of blood pressure medicine. Doing this puts you at risk for problems and can make the medicine less effective.  Ask your health care provider about side effects or reactions to medicines that you should watch for. Contact a health care provider if:  You think you are having a reaction to a medicine you are taking.  You have headaches that keep coming back (recurring).  You feel dizzy.  You have swelling in your ankles.  You have trouble with your vision. Get help right away if:  You develop a severe headache or confusion.  You have unusual weakness or numbness.  You feel faint.  You have severe pain in your chest or abdomen.  You vomit repeatedly.  You have trouble breathing. Summary  Hypertension is when the force of blood pumping through your arteries is too strong. If this condition is not controlled, it may put you at risk for serious complications.  Your personal target blood pressure may vary depending on your medical conditions, your age, and other factors. For most people, a normal blood pressure is less than 120/80.  Hypertension is treated with lifestyle changes, medicines, or a combination of both. Lifestyle changes include weight loss, eating a healthy, low-sodium diet, exercising more, and limiting alcohol. This information is not intended to replace advice given to you by your health care provider. Make sure you discuss any questions  you have with your health care provider. Document Released: 08/03/2005 Document Revised: 07/01/2016 Document Reviewed: 07/01/2016 Elsevier Interactive Patient Education  2017 [employer].

## 2016-12-14 NOTE — Progress Notes (Signed)
Subjective:  Patient ID: Patricia Davila, female    DOB: [birth year]  Age: 46 y.o. MRN: [id number]  CC: back pain   HPI Patricia Davila is a 46 y.o. female with a PMH of HTN, GERD, and asthma presents to establish care. Has chronic LBP with radiculopathy, left hip pain, migraines, uncontrolled HTN, and GERD. Worse complaint is of LBP and hip pain. She is s/p L4-5 microdiscectomy on 06/27/2010. Says the surgery made her worse. Had an MRI approximately 3-4 months ago and [facility] did not think anything had worsened. Patient thinks her hip is the source of the back pain. Currently with left hip pain since 2011. Pain has progressed and is now constant pain with radiation to the lower left leg. Pain is 9/10. Described as sharp and shooting pain. People have to help her stand and climb at times. Has at times felt left leg partial paralysis.    In regards to Migraines, Migraine is usually frontal. Lasts approximately 72 hours. 3-4 times per month. Associated photophobia, phonophobia, nausea, and visual blurring. Has been taking Goody's powders with minimal relief. Denies any other symptoms.    Outpatient Medications Prior to Visit  Medication Sig Dispense Refill  . albuterol (PROVENTIL HFA;VENTOLIN HFA) 108 (90 BASE) MCG/ACT inhaler Inhale 2 puffs into the lungs every 6 (six) hours as needed for wheezing or shortness of breath. (Patient not taking: Reported on 12/14/2016) 1 Inhaler 2  . benzonatate (TESSALON) 100 MG capsule Take 1 capsule (100 mg total) by mouth 3 (three) times daily as needed for cough. (Patient not taking: Reported on 12/14/2016) 21 capsule 0   No facility-administered medications prior to visit.      ROS Review of Systems  Constitutional: Negative for chills, fever and malaise/fatigue.  Eyes: Negative for blurred vision.  Respiratory: Negative for shortness of breath.   Cardiovascular: Negative for chest pain and palpitations.  Gastrointestinal: Positive for heartburn. Negative for  abdominal pain and nausea.  Genitourinary: Negative for dysuria and hematuria.  Musculoskeletal: Positive for back pain and joint pain. Negative for myalgias.  Skin: Negative for rash.  Neurological: Positive for headaches. Negative for tingling.  Psychiatric/Behavioral: Negative for depression. The patient is not nervous/anxious.     Objective:  BP (!) 163/119   Pulse 74   Temp 97.9 F (36.6 C) (Oral)   Ht 5\' 1"  (1.549 m)   Wt 186 lb 12.8 oz (84.7 kg)   LMP 12/09/2016 (Exact Date)   SpO2 99%   BMI 35.30 kg/m   BP/Weight 12/14/2016 06/29/2015 0/86/5784  Systolic BP 696 295 284  Diastolic BP 132 440 83  Wt. (Lbs) 186.8 187 -  BMI 35.3 35.35 -      Physical Exam  Constitutional: She is oriented to person, place, and time.  Well developed, well nourished, NAD, polite  HENT:  Head: Normocephalic and atraumatic.  Eyes: No scleral icterus.  Neck: Normal range of motion. Neck supple. No thyromegaly present.  Cardiovascular: Normal rate, regular rhythm and normal heart sounds.   Pulmonary/Chest: Effort normal and breath sounds normal.  Abdominal: Soft. Bowel sounds are normal. She exhibits no distension. There is no tenderness.  Musculoskeletal: She exhibits no edema.  Pt unable to lay supine for SLR due to left hip pain. Seated SLR positive. Limp favoring left side. Left hip with limited flexion, extension, abduction, and adduction 2/2 pain.  Neurological: She is alert and oriented to person, place, and time.  Skin: Skin is warm and dry. No rash noted. No erythema.  No pallor.  Psychiatric: She has a normal mood and affect. Her behavior is normal. Thought content normal.  Vitals reviewed.    Assessment & Plan:   1. Essential hypertension, benign - losartan (COZAAR) 50 MG tablet; Take 1 tablet (50 mg total) by mouth daily.  Dispense: 90 tablet; Refill: 3 - hydrochlorothiazide (HYDRODIURIL) 25 MG tablet; Take 1 tablet (25 mg total) by mouth daily. Take on tablet in the  morning.  Dispense: 90 tablet; Refill: 1 - CBC with Differential - Comprehensive metabolic panel  2. Gastroesophageal reflux disease without esophagitis - omeprazole (PRILOSEC) 40 MG capsule; Take 1 capsule (40 mg total) by mouth daily.  Dispense: 30 capsule; Refill: 3 - H. pylori antibody, IgG  3. Intractable migraine without status migrainosus, unspecified migraine type - topiramate (TOPAMAX) 25 MG tablet; Take 1 tablet (25 mg total) by mouth 2 (two) times daily.  Dispense: 30 tablet; Refill: 2  4. Left hip pain - XR HIPS BILAT W OR W/O PELVIS 3-4 VIEWS - gabapentin (NEURONTIN) 300 MG capsule; Take 1 capsule (300 mg total) by mouth 3 (three) times daily.  Dispense: 90 capsule; Refill: 3 - Crutches  Meds ordered this encounter  Medications  . omeprazole (PRILOSEC) 40 MG capsule    Sig: Take 1 capsule (40 mg total) by mouth daily.    Dispense:  30 capsule    Refill:  3    Order Specific Question:   Supervising Provider    Answer:   Tresa Garter [id number]  . losartan (COZAAR) 50 MG tablet    Sig: Take 1 tablet (50 mg total) by mouth daily.    Dispense:  90 tablet    Refill:  3    Order Specific Question:   Supervising Provider    Answer:   Tresa Garter [id number]  . hydrochlorothiazide (HYDRODIURIL) 25 MG tablet    Sig: Take 1 tablet (25 mg total) by mouth daily. Take on tablet in the morning.    Dispense:  90 tablet    Refill:  1    Order Specific Question:   Supervising Provider    Answer:   Tresa Garter [id number]  . topiramate (TOPAMAX) 25 MG tablet    Sig: Take 1 tablet (25 mg total) by mouth 2 (two) times daily.    Dispense:  30 tablet    Refill:  2    Order Specific Question:   Supervising Provider    Answer:   Tresa Garter [id number]  . gabapentin (NEURONTIN) 300 MG capsule    Sig: Take 1 capsule (300 mg total) by mouth 3 (three) times daily.    Dispense:  90 capsule    Refill:  3    Order Specific Question:   Supervising Provider     Answer:   Tresa Garter [[id number]]    Follow-up: Return in about 2 weeks (around 12/28/2016).   Clent Demark PA

## 2016-12-15 ENCOUNTER — Other Ambulatory Visit (INDEPENDENT_AMBULATORY_CARE_PROVIDER_SITE_OTHER): Payer: Self-pay | Admitting: Physician Assistant

## 2016-12-15 ENCOUNTER — Ambulatory Visit (HOSPITAL_COMMUNITY)
Admission: RE | Admit: 2016-12-15 | Discharge: 2016-12-15 | Disposition: A | Discharge status: Home / Self Care | Payer: Medicare Other | Source: Ambulatory Visit | Attending: Physician Assistant | Admitting: Physician Assistant

## 2016-12-15 DIAGNOSIS — M25552 Pain in left hip: Secondary | ICD-10-CM

## 2016-12-15 DIAGNOSIS — M25551 Pain in right hip: Secondary | ICD-10-CM | POA: Diagnosis present

## 2016-12-15 LAB — CBC WITH DIFFERENTIAL/PLATELET
BASOS ABS: 0 10*3/uL (ref 0.0–0.2)
Basos: 0 %
EOS (ABSOLUTE): 0.1 10*3/uL (ref 0.0–0.4)
Eos: 1 %
HEMOGLOBIN: 13 g/dL (ref 11.1–15.9)
Hematocrit: 39.1 % (ref 34.0–46.6)
IMMATURE GRANS (ABS): 0 10*3/uL (ref 0.0–0.1)
Immature Granulocytes: 0 %
LYMPHS: 26 %
Lymphocytes Absolute: 1.7 10*3/uL (ref 0.7–3.1)
MCH: 30.7 pg (ref 26.6–33.0)
MCHC: 33.2 g/dL (ref 31.5–35.7)
MCV: 92 fL (ref 79–97)
MONOCYTES: 6 %
Monocytes Absolute: 0.4 10*3/uL (ref 0.1–0.9)
NEUTROS ABS: 4.2 10*3/uL (ref 1.4–7.0)
Neutrophils: 67 %
Platelets: 307 10*3/uL (ref 150–379)
RBC: 4.24 x10E6/uL (ref 3.77–5.28)
RDW: 14.9 % (ref 12.3–15.4)
WBC: 6.4 10*3/uL (ref 3.4–10.8)

## 2016-12-15 LAB — COMPREHENSIVE METABOLIC PANEL
ALBUMIN: 4.3 g/dL (ref 3.5–5.5)
ALT: 11 IU/L (ref 0–32)
AST: 12 IU/L (ref 0–40)
Albumin/Globulin Ratio: 1.6 (ref 1.2–2.2)
Alkaline Phosphatase: 89 IU/L (ref 39–117)
BUN/Creatinine Ratio: 15 (ref 9–23)
BUN: 12 mg/dL (ref 6–24)
Bilirubin Total: <0.2 mg/dL (ref 0.0–1.2)
CALCIUM: 9.4 mg/dL (ref 8.7–10.2)
CHLORIDE: 103 mmol/L (ref 96–106)
CO2: 24 mmol/L (ref 18–29)
Creatinine, Ser: 0.81 mg/dL (ref 0.57–1.00)
GFR, EST AFRICAN AMERICAN: 101 mL/min/{1.73_m2} (ref >59)
GFR, EST NON AFRICAN AMERICAN: 88 mL/min/{1.73_m2} (ref >59)
GLUCOSE: 101 mg/dL — AB (ref 65–99)
Globulin, Total: 2.7 g/dL (ref 1.5–4.5)
Potassium: 4.1 mmol/L (ref 3.5–5.2)
Sodium: 140 mmol/L (ref 134–144)
TOTAL PROTEIN: 7 g/dL (ref 6.0–8.5)

## 2016-12-15 LAB — H. PYLORI ANTIBODY, IGG

## 2016-12-15 NOTE — Progress Notes (Signed)
Possible avascular necrosis.

## 2016-12-21 ENCOUNTER — Telehealth (INDEPENDENT_AMBULATORY_CARE_PROVIDER_SITE_OTHER): Payer: Self-pay | Admitting: Physician Assistant

## 2016-12-21 NOTE — Telephone Encounter (Signed)
Patient called stated having a lot of pain, would like a Rx for pain.  Patient also would like a call back regarding lab and x-ray results, stated did not understand them.  Please follow up with patient.

## 2016-12-21 NOTE — Telephone Encounter (Signed)
Patient will likely need to schedule and appointment to address her pain. Will forward to PCP for advising. Nat Christen, CMA

## 2016-12-22 ENCOUNTER — Ambulatory Visit (INDEPENDENT_AMBULATORY_CARE_PROVIDER_SITE_OTHER): Payer: Medicare Other | Admitting: Orthopaedic Surgery

## 2016-12-22 ENCOUNTER — Telehealth (INDEPENDENT_AMBULATORY_CARE_PROVIDER_SITE_OTHER): Payer: Self-pay | Admitting: Physician Assistant

## 2016-12-22 ENCOUNTER — Encounter (INDEPENDENT_AMBULATORY_CARE_PROVIDER_SITE_OTHER): Payer: Self-pay | Admitting: Orthopaedic Surgery

## 2016-12-22 DIAGNOSIS — M1612 Unilateral primary osteoarthritis, left hip: Secondary | ICD-10-CM | POA: Diagnosis not present

## 2016-12-22 MED ORDER — TRAMADOL HCL 50 MG PO TABS: 50.0000 mg | ORAL_TABLET | Freq: Four times a day (QID) | ORAL | 0 refills | Status: DC | PRN

## 2016-12-22 NOTE — Telephone Encounter (Signed)
Patient called stated saw ortho today,  They RX tramadol and Tylenol but it is not helping, Patient requesting a narcotic,  Patient stated pain in unbearable, stated does not need to go to hospital, just needs something to relieve pain,   Will have hip surgery in June.  Please call patient [phone]

## 2016-12-22 NOTE — Progress Notes (Signed)
Office Visit Note   Patient: Patricia Davila           Date of Birth: 06-25-1971           MRN: [id number] Visit Date: 12/22/2016              Requested by: Clent Demark, PA-C [place], [place] [id number] PCP: Clent Demark, PA-C   Assessment & Plan: Visit Diagnoses:  1. Primary osteoarthritis of left hip     Plan: Impression is severe bilateral hip degenerative joint disease worse on the left. We discussed treatment options ranging from conservative to hip replacement. She will like to start with cortisone injection which she talks with her husband about hip replacement. I did give her the information to read about hip replacement. We discussed the risks benefits alternatives of surgery she'll let us know how she wants to proceed with surgery.  Follow-Up Instructions: Return if symptoms worsen or fail to improve.   Orders:  Orders Placed This Encounter  Procedures  . Ambulatory referral to Physical Medicine Rehab   Meds ordered this encounter  Medications  . traMADol (ULTRAM) 50 MG tablet    Sig: Take 1 tablet (50 mg total) by mouth every 6 (six) hours as needed.    Dispense:  30 tablet    Refill:  0      Procedures: No procedures performed   Clinical Data: No additional findings.   Subjective: Chief Complaint  Patient presents with  . Right Hip - Pain  . Left Hip - Pain    Patient is a 46 year old female with bilateral hip pain is worse left back surgery without any. He endorses severe groin pain and limping. She walks with a cane occasionally. She is disabled from the health. She has a husband and 3 kids.  She has constant severe pain and has no radiating pain.    Review of Systems  Constitutional: Negative.   HENT: Negative.   Eyes: Negative.   Respiratory: Negative.   Cardiovascular: Negative.   Endocrine: Negative.   Musculoskeletal: Negative.   Neurological: Negative.   Hematological: Negative.   Psychiatric/Behavioral:  Negative.   All other systems reviewed and are negative.    Objective: Vital Signs: LMP 12/09/2016 (Exact Date)   Physical Exam  Constitutional: She is oriented to person, place, and time. She appears well-developed and well-nourished.  HENT:  Head: Normocephalic and atraumatic.  Eyes: EOM are normal.  Neck: Neck supple.  Pulmonary/Chest: Effort normal.  Abdominal: Soft.  Neurological: She is alert and oriented to person, place, and time.  Skin: Skin is warm. Capillary refill takes less than 2 seconds.  Psychiatric: She has a normal mood and affect. Her behavior is normal. Judgment and thought content normal.  Nursing note and vitals reviewed.   Ortho Exam Left hip exam shows very painful and limited range of motion hip. Positive Stinchfield sign. Lateral hip is nontender. No radicular symptoms. Specialty Comments:  No specialty comments available.  Imaging: No results found.   PMFS History: Patient Active Problem List   Diagnosis Date Noted  . Primary osteoarthritis of left hip 12/22/2016  . Major depressive disorder 10/24/2012  . Smokes tobacco daily 05/13/2010  . Headache(784.0) 05/13/2010  . Microalbuminuria 04/16/2009  . GERD 11/22/2008  . Left hip pain 11/22/2008  . Bipolar 1 disorder, depressed ([place]) 05/17/2007  . Lumbosacral degenerative disk disease 03/23/2005  . Essential hypertension, benign 08/18/2003   Past Medical History:  Diagnosis Date  .  Asthma   . Hypertension     Family History  Problem Relation Age of Onset  . Colon cancer Mother   . Colon cancer Sister     Past Surgical History:  Procedure Laterality Date  . LAMINECTOMY AND MICRODISCECTOMY LUMBAR SPINE   06/27/2010  . TUBAL LIGATION Bilateral    Social History   Occupational History  . Not on file.   Social History Main Topics  . Smoking status: Current Every Day Smoker  . Smokeless tobacco: Never Used  . Alcohol use No  . Drug use: No  . Sexual activity: Yes    Birth  control/ protection: None

## 2016-12-22 NOTE — Telephone Encounter (Signed)
Spoke with patient and informed her that Rx for narcotic will not be given as we do not keep or prescribe them at this office. Patient would like referral to pain management, patient will discuss this at her next OV. Nat Christen, CMA

## 2016-12-23 NOTE — Telephone Encounter (Signed)
I called patient and left message to call back. Please verify that she has an appointment to orthopedics. Thank you.

## 2016-12-23 NOTE — Telephone Encounter (Signed)
Patient had ortho appointment on 12/22/16. Nat Christen, CMA

## 2016-12-25 NOTE — Telephone Encounter (Signed)
Patient informed that Rx for pain will not be given. Patient stated the medication given by ortho is not working and has caused her to break out. Instructed patient to contact ortho to inform them of symptoms. Nat Christen, CMA

## 2016-12-25 NOTE — Telephone Encounter (Signed)
Orthopedic specialist has already ordered the following on 12/22/16:  traMADol (ULTRAM) 50 MG tablet     Sig: Take 1 tablet (50 mg total) by mouth every 6 (six) hours as needed.    Dispense:  30 tablet    I can not order more Tramadol. We have also discussed pain clinic at last visit. Pain clinic a costly option but if she has insurance or wants to pay out of pocket, I will put in the referral order.

## 2016-12-30 ENCOUNTER — Ambulatory Visit (INDEPENDENT_AMBULATORY_CARE_PROVIDER_SITE_OTHER): Payer: Medicare Other | Admitting: Physician Assistant

## 2016-12-31 ENCOUNTER — Ambulatory Visit (INDEPENDENT_AMBULATORY_CARE_PROVIDER_SITE_OTHER): Payer: Medicare Other | Admitting: Physical Medicine and Rehabilitation

## 2016-12-31 ENCOUNTER — Encounter (INDEPENDENT_AMBULATORY_CARE_PROVIDER_SITE_OTHER): Payer: Self-pay | Admitting: Physical Medicine and Rehabilitation

## 2016-12-31 ENCOUNTER — Ambulatory Visit (INDEPENDENT_AMBULATORY_CARE_PROVIDER_SITE_OTHER): Payer: Self-pay

## 2016-12-31 VITALS — BP 150/97 | HR 73

## 2016-12-31 DIAGNOSIS — M25552 Pain in left hip: Secondary | ICD-10-CM | POA: Diagnosis not present

## 2016-12-31 NOTE — Patient Instructions (Signed)

## 2016-12-31 NOTE — Progress Notes (Signed)
Patricia Davila - 46 y.o. female MRN [id number]  Date of birth: [birth year]  Office Visit Note: Visit Date: 12/31/2016 PCP: Clent Demark, PA-C Referred by: Clent Demark, PA-C  Subjective: Chief Complaint  Patient presents with  . Left Hip - Pain   HPI: Patricia Davila is a 46 year old female with bilateral the left much more than right hip and groin pain. She reports worst pain with standing and ambulating. She reports her symptoms are constant and she can be worse with actually even laying flat. She says it can radiate at times down to the foot.    ROS Otherwise per HPI.  Assessment & Plan: Visit Diagnoses:  1. Pain in left hip     Plan: Findings:  Diagnostic and hopefully therapeutic left anesthetic hip arthrogram. There was excellent flow of contrast but extreme amount of pain with delivering the injectate. There was no joint fluid aspirated. After the injection she did have some relief of her symptoms though still having pretty mechanical pain with rotation but much improved.    Meds & Orders: No orders of the defined types were placed in this encounter.   Orders Placed This Encounter  Procedures  . Large Joint Injection/Arthrocentesis  . XR C-ARM NO REPORT    Follow-up: Return if symptoms worsen or fail to improve, for Dr. Erlinda Hong.   Procedures: Hip anesthetic arthrogram Date/Time: 12/31/2016 3:12 PM Performed by: Magnus Sinning Authorized by: Magnus Sinning   Consent Given by:  Patient Site marked: the procedure site was marked   Timeout: prior to procedure the correct patient, procedure, and site was verified   Indications:  Pain and diagnostic evaluation Location:  Hip Site:  L hip joint Prep: patient was prepped and draped in usual sterile fashion   Needle Size:  22 G Approach:  Anterior Ultrasound Guidance: No   Fluoroscopic Guidance: No   Arthrogram: Yes   Medications:  3 mL bupivacaine 0.5 %; 80 mg triamcinolone acetonide 40 MG/ML Aspiration  Attempted: Yes   Patient tolerance:  Patient tolerated the procedure well with no immediate complications  Arthrogram demonstrated excellent flow of contrast throughout the joint surface without extravasation or obvious defect but clearly severe osteoarthritic changes.  The patient had some relief of symptoms during the anesthetic phase of the injection. Delivery of the injectate was fairly painful.      No notes on file   Clinical History: No specialty comments available.  She reports that she has been smoking.  She has never used smokeless tobacco. No results for input(s): HGBA1C, LABURIC in the last 8760 hours.  Objective:  VS:  HT:    WT:   BMI:     BP:(!) 150/97  HR:73bpm  TEMP: ( )  RESP:95 % Physical Exam  Musculoskeletal:  Patient has concordant pain with internal rotation of the hips particular on the left.    Ortho Exam Imaging: Xr C-arm No Report  Result Date: 12/31/2016 Please see Notes or Procedures tab for imaging impression.   Past Medical/Family/Surgical/Social History: Medications & Allergies reviewed per EMR Patient Active Problem List   Diagnosis Date Noted  . Primary osteoarthritis of left hip 12/22/2016  . Major depressive disorder 10/24/2012  . Smokes tobacco daily 05/13/2010  . Headache(784.0) 05/13/2010  . Microalbuminuria 04/16/2009  . GERD 11/22/2008  . Left hip pain 11/22/2008  . Bipolar 1 disorder, depressed ([place]) 05/17/2007  . Lumbosacral degenerative disk disease 03/23/2005  . Essential hypertension, benign 08/18/2003   Past Medical History:  Diagnosis  Date  . Asthma   . Hypertension    Family History  Problem Relation Age of Onset  . Colon cancer Mother   . Colon cancer Sister    Past Surgical History:  Procedure Laterality Date  . LAMINECTOMY AND MICRODISCECTOMY LUMBAR SPINE   06/27/2010  . TUBAL LIGATION Bilateral    Social History   Occupational History  . Not on file.   Social History Main Topics  . Smoking status:  Current Every Day Smoker  . Smokeless tobacco: Never Used  . Alcohol use No  . Drug use: No  . Sexual activity: Yes    Birth control/ protection: None

## 2016-12-31 NOTE — Progress Notes (Deleted)
Left hip and groin pain. Constant. Worse with laying flat. Radiates down leg to foot with laying.

## 2017-01-01 MED ORDER — TRIAMCINOLONE ACETONIDE 40 MG/ML IJ SUSP
80.0000 mg | INTRAMUSCULAR | Status: AC | PRN
  Administered 2016-12-31: 80 mg via INTRA_ARTICULAR

## 2017-01-01 MED ORDER — BUPIVACAINE HCL 0.5 % IJ SOLN
3.0000 mL | INTRAMUSCULAR | Status: AC | PRN
  Administered 2016-12-31: 3 mL via INTRA_ARTICULAR

## 2017-01-19 ENCOUNTER — Ambulatory Visit (INDEPENDENT_AMBULATORY_CARE_PROVIDER_SITE_OTHER): Payer: Medicare Other | Admitting: Physician Assistant

## 2017-01-19 ENCOUNTER — Encounter (INDEPENDENT_AMBULATORY_CARE_PROVIDER_SITE_OTHER): Payer: Self-pay | Admitting: Physician Assistant

## 2017-01-19 VITALS — BP 138/91 | HR 72 | Temp 98.2°F | Wt 185.0 lb

## 2017-01-19 DIAGNOSIS — I10 Essential (primary) hypertension: Secondary | ICD-10-CM | POA: Diagnosis not present

## 2017-01-19 DIAGNOSIS — G43909 Migraine, unspecified, not intractable, without status migrainosus: Secondary | ICD-10-CM | POA: Diagnosis not present

## 2017-01-19 DIAGNOSIS — M25551 Pain in right hip: Secondary | ICD-10-CM | POA: Diagnosis not present

## 2017-01-19 DIAGNOSIS — Z716 Tobacco abuse counseling: Secondary | ICD-10-CM

## 2017-01-19 DIAGNOSIS — M25552 Pain in left hip: Secondary | ICD-10-CM

## 2017-01-19 DIAGNOSIS — G43919 Migraine, unspecified, intractable, without status migrainosus: Secondary | ICD-10-CM | POA: Diagnosis not present

## 2017-01-19 MED ORDER — NICOTINE 7 MG/24HR TD PT24: 7.0000 mg | MEDICATED_PATCH | Freq: Every day | TRANSDERMAL | 0 refills | Status: DC

## 2017-01-19 MED ORDER — LOSARTAN POTASSIUM 50 MG PO TABS
50.0000 mg | ORAL_TABLET | Freq: Every day | ORAL | 3 refills | Status: DC
Start: 2017-01-19 — End: 2017-12-07

## 2017-01-19 MED ORDER — HYDROXYZINE HCL 50 MG PO TABS: 50.0000 mg | ORAL_TABLET | Freq: Once | ORAL | 0 refills | Status: AC

## 2017-01-19 MED ORDER — TRAMADOL HCL 50 MG PO TABS: 50.0000 mg | ORAL_TABLET | Freq: Four times a day (QID) | ORAL | 0 refills | Status: DC | PRN

## 2017-01-19 MED ORDER — HYDROCHLOROTHIAZIDE 25 MG PO TABS: 25.0000 mg | ORAL_TABLET | Freq: Every day | ORAL | 1 refills | Status: DC

## 2017-01-19 MED ORDER — SUMATRIPTAN SUCCINATE 25 MG PO TABS: 25.0000 mg | ORAL_TABLET | Freq: Every day | ORAL | 0 refills | Status: DC

## 2017-01-19 MED ORDER — TOPIRAMATE 25 MG PO TABS: 25.0000 mg | ORAL_TABLET | Freq: Two times a day (BID) | ORAL | 2 refills | Status: DC

## 2017-01-19 NOTE — Progress Notes (Signed)
Subjective:  Patient ID: Patricia Davila, female    DOB: 1971/07/26  Age: 46 y.o. MRN: [id number]  CC: f/u multiple complaints  HPI Patricia Davila is a 46 y.o. female with a PMH of Asthma and HTN presents to f/u on bilateral hip pain, migraines, and HTN. 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  Pt has been to orthopedics and received hip injection on left side for osteoarthritis and was told that she would need surgery. Hip injection "did not help at all". Still with the same amount of pain. Is taking Gabapentin without relief. Is requesting something stronger than Tramadol.    Has noticed that topiramate has decreased the migraine frequency to one headache per month. Would like a refill of Topiramate.     States that BP has been well controlled on HCTZ and Losartan. Would like a refill. Does not endorse any cardiovascular symptoms.     Says she would like help to quit smoking. Request nicotine patch.   Outpatient Medications Prior to Visit  Medication Sig Dispense Refill  . omeprazole (PRILOSEC) 40 MG capsule Take 1 capsule (40 mg total) by mouth daily. 30 capsule 3  . gabapentin (NEURONTIN) 300 MG capsule Take 1 capsule (300 mg total) by mouth 3 (three) times daily. 90 capsule 3  . hydrochlorothiazide (HYDRODIURIL) 25 MG tablet Take 1 tablet (25 mg total) by mouth daily. Take on tablet in the morning. 90 tablet 1  . losartan (COZAAR) 50 MG tablet Take 1 tablet (50 mg total) by mouth daily. 90 tablet 3  . topiramate (TOPAMAX) 25 MG tablet Take 1 tablet (25 mg total) by mouth 2 (two) times daily. 30 tablet 2  . traMADol (ULTRAM) 50 MG tablet Take 1 tablet (50 mg total) by mouth every 6 (six) hours as needed. 30 tablet 0   No facility-administered medications prior to visit.      ROS Review of Systems  Constitutional: Negative for chills, fever and malaise/fatigue.  Eyes: Negative for blurred vision.  Respiratory: Negative for shortness of breath.    Cardiovascular: Negative for chest pain and palpitations.  Gastrointestinal: Negative for abdominal pain and nausea.  Genitourinary: Negative for dysuria and hematuria.  Musculoskeletal: Positive for joint pain. Negative for myalgias.  Skin: Negative for rash.  Neurological: Negative for tingling and headaches.  Psychiatric/Behavioral: Negative for depression. The patient is not nervous/anxious.     Objective:  BP (!) 138/91 (BP Location: Left Arm, Patient Position: Sitting, Cuff Size: Large)   Pulse 72   Temp 98.2 F (36.8 C) (Oral)   Wt 185 lb (83.9 kg)   LMP 12/30/2016 (Exact Date)   SpO2 100%   BMI 34.96 kg/m   BP/Weight 01/19/2017 12/31/2016 02/14/1600  Systolic BP 093 235 573  Diastolic BP 91 97 99  Wt. (Lbs) 185 - 186.8  BMI 34.96 - 35.3      Physical Exam  Constitutional: She is oriented to person, place, and time.  Well developed, overweight NAD, polite  HENT:  Head: Normocephalic and atraumatic.  Eyes: No scleral icterus.  Cardiovascular: Normal rate, regular rhythm and normal heart sounds.   Pulmonary/Chest: Effort normal and breath sounds normal.  Musculoskeletal: She exhibits no edema.  Mildly abnormal ambulation 2/2 bilateral hip pain.  Neurological: She is alert and oriented to person, place, and time. No cranial nerve deficit.  Skin: Skin is warm and dry. No rash noted. No erythema. No pallor.  Psychiatric: She has a normal mood and  affect. Her behavior is normal. Thought content normal.  Vitals reviewed.    Assessment & Plan:   1. Hip pain, bilateral - Refill traMADol (ULTRAM) 50 MG tablet; Take 1 tablet (50 mg total) by mouth every 6 (six) hours as needed.  Dispense: 30 tablet; Refill: 0 - Ambulatory referral to [facility]; Future - MR HIP RIGHT W CONTRAST; Future  2. Migraine without status migrainosus, not intractable, unspecified migraine type - Begin SUMAtriptan (IMITREX) 25 MG tablet; Take 1 tablet (25 mg total) by  mouth daily. May take one more tablet two hours after the first. No more than two tablets per day.  Dispense: 30 tablet; Refill: 0  3. Intractable migraine without status migrainosus, unspecified migraine type - Refill topiramate (TOPAMAX) 25 MG tablet; Take 1 tablet (25 mg total) by mouth 2 (two) times daily.  Dispense: 30 tablet; Refill: 2  4. Essential hypertension, benign - Refill losartan (COZAAR) 50 MG tablet; Take 1 tablet (50 mg total) by mouth daily.  Dispense: 90 tablet; Refill: 3 - Refill hydrochlorothiazide (HYDRODIURIL) 25 MG tablet; Take 1 tablet (25 mg total) by mouth daily. Take on tablet in the morning.  Dispense: 90 tablet; Refill: 1  5. Encounter for tobacco use cessation counseling - Begin nicotine (NICODERM CQ - DOSED IN MG/24 HR) 7 mg/24hr patch; Place 1 patch (7 mg total) onto the skin daily.  Dispense: 28 patch; Refill: 0   Meds ordered this encounter  Medications  . traMADol (ULTRAM) 50 MG tablet    Sig: Take 1 tablet (50 mg total) by mouth every 6 (six) hours as needed.    Dispense:  30 tablet    Refill:  0    Order Specific Question:   Supervising Provider    Answer:   Tresa Garter [id number]  . topiramate (TOPAMAX) 25 MG tablet    Sig: Take 1 tablet (25 mg total) by mouth 2 (two) times daily.    Dispense:  30 tablet    Refill:  2    Order Specific Question:   Supervising Provider    Answer:   Tresa Garter [id number]  . SUMAtriptan (IMITREX) 25 MG tablet    Sig: Take 1 tablet (25 mg total) by mouth daily. May take one more tablet two hours after the first. No more than two tablets per day.    Dispense:  30 tablet    Refill:  0    Order Specific Question:   Supervising Provider    Answer:   Tresa Garter [id number]  . losartan (COZAAR) 50 MG tablet    Sig: Take 1 tablet (50 mg total) by mouth daily.    Dispense:  90 tablet    Refill:  3    Order Specific Question:   Supervising Provider    Answer:   Tresa Garter [id number]  .  hydrochlorothiazide (HYDRODIURIL) 25 MG tablet    Sig: Take 1 tablet (25 mg total) by mouth daily. Take on tablet in the morning.    Dispense:  90 tablet    Refill:  1    Order Specific Question:   Supervising Provider    Answer:   Tresa Garter [id number]  . nicotine (NICODERM CQ - DOSED IN MG/24 HR) 7 mg/24hr patch    Sig: Place 1 patch (7 mg total) onto the skin daily.    Dispense:  28 patch    Refill:  0    Order Specific Question:  Supervising Provider    Answer:   Tresa Garter [id number]  . hydrOXYzine (ATARAX/VISTARIL) 50 MG tablet    Sig: Take 1 tablet (50 mg total) by mouth once.    Dispense:  1 tablet    Refill:  0    Order Specific Question:   Supervising Provider    Answer:   Tresa Garter [id number]    Follow-up: Return if symptoms worsen or fail to improve.   Clent Demark PA

## 2017-01-19 NOTE — Patient Instructions (Signed)
Avascular Necrosis Avascular necrosis is a disease resulting from the temporary or permanent loss of blood supply to a bone. This disease may also be known as:  Osteonecrosis.  Aseptic necrosis.  Ischemic bone necrosis.  Without proper blood supply, the internal layer of the affected bone dies and the outer layer of the bone may break down. If this process affects a bone near a joint, it may lead to collapse of that joint. Common bones that are affected by this condition include:  The top of your thigh bone (femoral head).  One or more bones in your wrist (scaphoid orlunate).  One or more bones in your foot (metatarsals).  One of the bones in your ankle (navicular).  The joint most commonly affected by this condition is the hip joint. Avascular necrosis rarely occurs in more than one bone at a time. What are the causes?  Damage or injury to a bone or joint.  Using corticosteroid medicine for a long period of time.  Changes in your immune or hormone systems.  Excessive exposure to radiation. What increases the risk?  Alcohol abuse.  Previous traumatic injury to a joint.  Using corticosteroid medicines for a long period of time or often.  Having a medical condition such as: ? HIV or AIDS. ? Diabetes. ? Sickle cell disease. ? An autoimmune disease. What are the signs or symptoms? The main symptoms of avascular necrosis are pain and decreased motion in the affected bone or joint. In the early stages the pain may be minor and occur only with activity. As avascular necrosis progresses, pain may gradually worsen and occur while at rest. The pain may suddenly become severe if an affected joint collapses. How is this diagnosed? Avascular necrosis may be diagnosed with:  A medical history.  A physical exam.  X-rays.  An MRI.  A bone scan.  How is this treated? Treatments may include:  Medicine to help relieve pain.  Avoiding placing any pressure or weight  ontheaffected area. If avascular necrosis occurs in your hip, ankle, or foot, you may be instructed to use crutches or a rolling scooter.  Surgery, such as: ? Core decompression. In this surgery, one or more holes are placed in the bone for new blood vessels to grow into. This provides a renewed blood supply to the bone. Core decompression can often reduce pain and pressure in the affected bone and slow the progression of bone and joint destruction. ? Osteotomy. In this surgery, the bone is reshaped to reduce stress on the affected area of the joint. ? Bone grafting. In this surgery, healthy bone from one part of your body is transplanted to the affected area. ? Arthroplasty. Arthroplasty is also known as total joint replacement. In this surgery, the affected surface on one or both sides of a joint is replaced with artificial parts (prostheses).  Electrical stimulation. This may help encourage new bone growth.  Follow these instructions at home:  Take medicines only as directed by your health care provider.  Follow your health care provider's recommendations on limiting activities or using crutches to rest your affected joint.  Meet with aphysical therapist as directed by your health care provider.  Keep all follow-up visits as directed by your health care provider. This is important. Contact a health care provider if:  Your pain worsens.  You have decreased motion in your affected joint. Get help right away if: Your pain suddenly becomes severe. This information is not intended to replace advice given to you   by your health care provider. Make sure you discuss any questions you have with your health care provider. Document Released: 01/23/2002 Document Revised: 01/09/2016 Document Reviewed: 10/11/2013 Elsevier Interactive Patient Education  2018 [employer]  

## 2017-01-20 ENCOUNTER — Other Ambulatory Visit (INDEPENDENT_AMBULATORY_CARE_PROVIDER_SITE_OTHER): Payer: Self-pay | Admitting: Physician Assistant

## 2017-01-20 ENCOUNTER — Telehealth (INDEPENDENT_AMBULATORY_CARE_PROVIDER_SITE_OTHER): Payer: Self-pay | Admitting: Physician Assistant

## 2017-01-20 DIAGNOSIS — F17200 Nicotine dependence, unspecified, uncomplicated: Secondary | ICD-10-CM

## 2017-01-20 DIAGNOSIS — Z716 Tobacco abuse counseling: Secondary | ICD-10-CM

## 2017-01-20 MED ORDER — NICOTINE 7 MG/24HR TD PT24: 7.0000 mg | MEDICATED_PATCH | Freq: Every day | TRANSDERMAL | 0 refills | Status: DC

## 2017-01-20 NOTE — Telephone Encounter (Signed)
I resent the nicotine patches. Please notify patient.

## 2017-01-20 NOTE — Progress Notes (Signed)
Nicotine patches resent.

## 2017-01-20 NOTE — Telephone Encounter (Signed)
FWD to PCP. Tempestt S Roberts, CMA  

## 2017-01-20 NOTE — Telephone Encounter (Signed)
Patient left voicemail last night stated [facility] did not receive RX for patches.  Please follow up with patient.

## 2017-01-20 NOTE — Telephone Encounter (Signed)
Patient notified. Patricia Davila

## 2017-01-21 ENCOUNTER — Other Ambulatory Visit (INDEPENDENT_AMBULATORY_CARE_PROVIDER_SITE_OTHER): Payer: Self-pay | Admitting: Physician Assistant

## 2017-01-21 ENCOUNTER — Telehealth (INDEPENDENT_AMBULATORY_CARE_PROVIDER_SITE_OTHER): Payer: Self-pay | Admitting: Physician Assistant

## 2017-01-21 DIAGNOSIS — F17200 Nicotine dependence, unspecified, uncomplicated: Secondary | ICD-10-CM

## 2017-01-21 MED ORDER — NICOTINE 21 MG/24HR TD PT24: 21.0000 mg | MEDICATED_PATCH | Freq: Every day | TRANSDERMAL | 0 refills | Status: DC

## 2017-01-21 NOTE — Progress Notes (Signed)
Pt request for Nicotine patch from 7mg  to 21 mg.

## 2017-01-21 NOTE — Telephone Encounter (Signed)
FWD to PCP. Tempestt S Roberts, CMA  

## 2017-01-21 NOTE — Telephone Encounter (Signed)
Patient called stated [facility] on [address]. Gave her #3 Patches but they also told her she needs to use #1 Patches before she can use #3.  Patient requesting RX for #1 Patches to be sent to [facility] on [address].  Please follow up with patient.

## 2017-01-21 NOTE — Telephone Encounter (Signed)
Tell her that the lowest dose patch was sent. The patch should say 7mg /24 hr patch.

## 2017-01-21 NOTE — Telephone Encounter (Signed)
Left message for patient informing her that patch was sent. Nat Christen, CMA

## 2017-01-21 NOTE — Telephone Encounter (Signed)
Patient called back stated got nurses message but pharmacy told her we sent same Rx in the 7 mg patches are the #3 patches.  And she needs the #1 patches thinks they are about 20 mg/patches but not sure.  Patient stated she needs a higher dose patches to start off with so she can quite smoking before her surgery.  Please follow up with patient

## 2017-01-21 NOTE — Telephone Encounter (Signed)
I have sent the order for 21mg /24hr

## 2017-01-26 ENCOUNTER — Telehealth (INDEPENDENT_AMBULATORY_CARE_PROVIDER_SITE_OTHER): Payer: Self-pay | Admitting: Physician Assistant

## 2017-01-26 NOTE — Telephone Encounter (Signed)
Left message for patient to call office, since medicaid is secondary no precert needed. Nat Christen, CMA

## 2017-01-26 NOTE — Telephone Encounter (Signed)
Patient called stated waiting on appt for MRI and was told nurse would call her with an appt because pre- certification had to be done first. Patient has Medicaid and [facility] Medicare  Please follow up with patient

## 2017-01-26 NOTE — Telephone Encounter (Signed)
Patient called back, PCP had to correct order for MRI, will call and schedule for patient anytime after 6/14 but before 6/20 and will call patient with date and time. Nat Christen, CMA

## 2017-01-27 ENCOUNTER — Other Ambulatory Visit (INDEPENDENT_AMBULATORY_CARE_PROVIDER_SITE_OTHER): Payer: Self-pay | Admitting: Physician Assistant

## 2017-01-27 DIAGNOSIS — G8929 Other chronic pain: Secondary | ICD-10-CM

## 2017-01-27 DIAGNOSIS — M25552 Pain in left hip: Principal | ICD-10-CM

## 2017-01-27 DIAGNOSIS — M25551 Pain in right hip: Secondary | ICD-10-CM

## 2017-01-27 NOTE — Progress Notes (Signed)
Redo orders for bilateral hip MR.

## 2017-02-01 ENCOUNTER — Other Ambulatory Visit (INDEPENDENT_AMBULATORY_CARE_PROVIDER_SITE_OTHER): Payer: Self-pay | Admitting: Physician Assistant

## 2017-02-01 ENCOUNTER — Telehealth (INDEPENDENT_AMBULATORY_CARE_PROVIDER_SITE_OTHER): Payer: Self-pay | Admitting: Physician Assistant

## 2017-02-01 DIAGNOSIS — M25551 Pain in right hip: Secondary | ICD-10-CM

## 2017-02-01 DIAGNOSIS — Z79899 Other long term (current) drug therapy: Secondary | ICD-10-CM

## 2017-02-01 DIAGNOSIS — M25552 Pain in left hip: Principal | ICD-10-CM

## 2017-02-01 NOTE — Telephone Encounter (Signed)
Patient called back stated would like a phone call today regarding her depression medication.  Please follow up with patient.

## 2017-02-01 NOTE — Telephone Encounter (Signed)
Patient called stated stopped taking all her medication because her medication where"off the chain and made her act like a wild women"  Patient declined appt for this week stated husband having surgery.  Stated just wanted to let Domenica Fail PA. And if he can call her back.

## 2017-02-01 NOTE — Telephone Encounter (Signed)
I spoke to the patient. Says that she is feeling depressed and has been crying today. Has her chronic pain issue she is dealing with and the hospitalization of her husband. I have advised that she come in on Friday to discuss depression. She was agreeable since Friday would be the first date she is available.

## 2017-02-01 NOTE — Progress Notes (Unsigned)
Rediologist's office requests that a creatinine be ordered since her last creatinine was drawn greater than six weeks ago, (12/14/16).

## 2017-02-01 NOTE — Telephone Encounter (Signed)
FWD to PCP. Patricia Davila Patricia Davila, CMA  

## 2017-02-01 NOTE — Telephone Encounter (Signed)
Left a message for patient detailing her MRI appointment. It is scheduled for February 12 2017 at [facility] hospital, patient must arrive by 11:30 as she will have labs prior to appointment. Patient is not to eat at least 2 hours prior to appointment. Instructed patient to call the office if she has any questions. Nat Christen, CMA

## 2017-02-05 ENCOUNTER — Encounter (INDEPENDENT_AMBULATORY_CARE_PROVIDER_SITE_OTHER): Payer: Self-pay | Admitting: Physician Assistant

## 2017-02-05 ENCOUNTER — Ambulatory Visit (INDEPENDENT_AMBULATORY_CARE_PROVIDER_SITE_OTHER): Payer: Medicare Other | Admitting: Physician Assistant

## 2017-02-05 VITALS — BP 120/78 | HR 68 | Temp 98.8°F | Resp 18 | Ht 61.0 in | Wt 185.0 lb

## 2017-02-05 DIAGNOSIS — M25552 Pain in left hip: Secondary | ICD-10-CM | POA: Diagnosis not present

## 2017-02-05 DIAGNOSIS — F331 Major depressive disorder, recurrent, moderate: Secondary | ICD-10-CM | POA: Diagnosis not present

## 2017-02-05 DIAGNOSIS — K219 Gastro-esophageal reflux disease without esophagitis: Secondary | ICD-10-CM | POA: Diagnosis not present

## 2017-02-05 DIAGNOSIS — Z79899 Other long term (current) drug therapy: Secondary | ICD-10-CM

## 2017-02-05 DIAGNOSIS — I10 Essential (primary) hypertension: Secondary | ICD-10-CM

## 2017-02-05 DIAGNOSIS — M25551 Pain in right hip: Secondary | ICD-10-CM | POA: Diagnosis not present

## 2017-02-05 MED ORDER — HYDROCHLOROTHIAZIDE 25 MG PO TABS: 25.0000 mg | ORAL_TABLET | Freq: Every day | ORAL | 1 refills | Status: DC

## 2017-02-05 MED ORDER — OMEPRAZOLE 40 MG PO CPDR: 40.0000 mg | DELAYED_RELEASE_CAPSULE | Freq: Every day | ORAL | 3 refills | Status: DC

## 2017-02-05 MED ORDER — ESCITALOPRAM OXALATE 5 MG PO TABS: 5.0000 mg | ORAL_TABLET | Freq: Every day | ORAL | 3 refills | Status: DC

## 2017-02-05 MED ORDER — TRAMADOL HCL 50 MG PO TABS: 50.0000 mg | ORAL_TABLET | Freq: Four times a day (QID) | ORAL | 0 refills | Status: DC | PRN

## 2017-02-05 NOTE — Patient Instructions (Signed)
Major Depressive Disorder, Adult Major depressive disorder (MDD) is a mental health condition. It may also be called clinical depression or unipolar depression. MDD usually causes feelings of sadness, hopelessness, or helplessness. MDD can also cause physical symptoms. It can interfere with work, school, relationships, and other everyday activities. MDD may be mild, moderate, or severe. It may occur once (single episode major depressive disorder) or it may occur multiple times (recurrent major depressive disorder). What are the causes? The exact cause of this condition is not known. MDD is most likely caused by a combination of things, which may include:  Genetic factors. These are traits that are passed along from parent to child.  Individual factors. Your personality, your behavior, and the way you handle your thoughts and feelings may contribute to MDD. This includes personality traits and behaviors learned from others.  Physical factors, such as: ? Differences in the part of your brain that controls emotion. This part of your brain may be different than it is in people who do not have MDD. ? Long-term (chronic) medical or psychiatric illnesses.  Social factors. Traumatic experiences or major life changes may play a role in the development of MDD.  What increases the risk? This condition is more likely to develop in women. The following factors may also make you more likely to develop MDD:  A family history of depression.  Troubled family relationships.  Abnormally low levels of certain brain chemicals.  Traumatic events in childhood, especially abuse or the loss of a parent.  Being under a lot of stress, or long-term stress, especially from upsetting life experiences or losses.  A history of: ? Chronic physical illness. ? Other mental health disorders. ? Substance abuse.  Poor living conditions.  Experiencing social exclusion or discrimination on a regular basis.  What are  the signs or symptoms? The main symptoms of MDD typically include:  Constant depressed or irritable mood.  Loss of interest in things and activities.  MDD symptoms may also include:  Sleeping or eating too much or too little.  Unexplained weight change.  Fatigue or low energy.  Feelings of worthlessness or guilt.  Difficulty thinking clearly or making decisions.  Thoughts of suicide or of harming others.  Physical agitation or weakness.  Isolation.  Severe cases of MDD may also occur with other symptoms, such as:  Delusions or hallucinations, in which you imagine things that are not real (psychotic depression).  Low-level depression that lasts at least a year (chronic depression or persistent depressive disorder).  Extreme sadness and hopelessness (melancholic depression).  Trouble speaking and moving (catatonic depression).  How is this diagnosed? This condition may be diagnosed based on:  Your symptoms.  Your medical history, including your mental health history. This may involve tests to evaluate your mental health. You may be asked questions about your lifestyle, including any drug and alcohol use, and how long you have had symptoms of MDD.  A physical exam.  Blood tests to rule out other conditions.  You must have a depressed mood and at least four other MDD symptoms most of the day, nearly every day in the same 2-week timeframe before your health care provider can confirm a diagnosis of MDD. How is this treated? This condition is usually treated by mental health professionals, such as psychologists, psychiatrists, and clinical social workers. You may need more than one type of treatment. Treatment may include:  Psychotherapy. This is also called talk therapy or counseling. Types of psychotherapy include: ? Cognitive behavioral   therapy (CBT). This type of therapy teaches you to recognize unhealthy feelings, thoughts, and behaviors, and replace them with  positive thoughts and actions. ? Interpersonal therapy (IPT). This helps you to improve the way you relate to and communicate with others. ? Family therapy. This treatment includes members of your family.  Medicine to treat anxiety and depression, or to help you control certain emotions and behaviors.  Lifestyle changes, such as: ? Limiting alcohol and drug use. ? Exercising regularly. ? Getting plenty of sleep. ? Making healthy eating choices. ? Spending more time outdoors.  Treatments involving stimulation of the brain can be used in situations with extremely severe symptoms, or when medicine or other therapies do not work over time. These treatments include electroconvulsive therapy, transcranial magnetic stimulation, and vagal nerve stimulation. Follow these instructions at home: Activity  Return to your normal activities as told by your health care provider.  Exercise regularly and spend time outdoors as told by your health care provider. General instructions  Take over-the-counter and prescription medicines only as told by your health care provider.  Do not drink alcohol. If you drink alcohol, limit your alcohol intake to no more than 1 drink a day for nonpregnant women and 2 drinks a day for men. One drink equals 12 oz of beer, 5 oz of wine, or 1 oz of hard liquor. Alcohol can affect any antidepressant medicines you are taking. Talk to your health care provider about your alcohol use.  Eat a healthy diet and get plenty of sleep.  Find activities that you enjoy doing, and make time to do them.  Consider joining a support group. Your health care provider may be able to recommend a support group.  Keep all follow-up visits as told by your health care provider. This is important. Where to find more information: [employer] on Mental Illness  www.nami.org  U.[place]. National Institute of Mental Health  https://carter.com/  National Suicide Prevention  Lifeline  1-800-273-TALK 8703020436). This is free, 24-hour help.  Contact a health care provider if:  Your symptoms get worse.  You develop new symptoms. Get help right away if:  You self-harm.  You have serious thoughts about hurting yourself or others.  You see, hear, taste, smell, or feel things that are not present (hallucinate). This information is not intended to replace advice given to you by your health care provider. Make sure you discuss any questions you have with your health care provider. Document Released: 11/28/2012 Document Revised: 04/09/2016 Document Reviewed: 02/12/2016 Elsevier Interactive Patient Education  2017 [place]. Escitalopram tablets What is this medicine? ESCITALOPRAM (es sye TAL oh pram) is used to treat depression and certain types of anxiety. This medicine may be used for other purposes; ask your health care provider or pharmacist if you have questions. COMMON BRAND NAME(S): Lexapro What should I tell my health care provider before I take this medicine? They need to know if you have any of these conditions: -bipolar disorder or a family history of bipolar disorder -diabetes -glaucoma -heart disease -kidney or liver disease -receiving electroconvulsive therapy -seizures (convulsions) -suicidal thoughts, plans, or attempt by you or a family member -an unusual or allergic reaction to escitalopram, the related drug citalopram, other medicines, foods, dyes, or preservatives -pregnant or trying to become pregnant -breast-feeding How should I use this medicine? Take this medicine by mouth with a glass of water. Follow the directions on the prescription label. You can take it with or without food. If it upsets your stomach,  take it with food. Take your medicine at regular intervals. Do not take it more often than directed. Do not stop taking this medicine suddenly except upon the advice of your doctor. Stopping this medicine too quickly may cause  serious side effects or your condition may worsen. A special MedGuide will be given to you by the pharmacist with each prescription and refill. Be sure to read this information carefully each time. Talk to your pediatrician regarding the use of this medicine in children. Special care may be needed. Overdosage: If you think you have taken too much of this medicine contact a poison control center or emergency room at once. NOTE: This medicine is only for you. Do not share this medicine with others. What if I miss a dose? If you miss a dose, take it as soon as you can. If it is almost time for your next dose, take only that dose. Do not take double or extra doses. What may interact with this medicine? Do not take this medicine with any of the following medications: -certain medicines for fungal infections like fluconazole, itraconazole, ketoconazole, posaconazole, voriconazole -cisapride -citalopram -dofetilide -dronedarone -linezolid -MAOIs like Carbex, Eldepryl, Marplan, Nardil, and Parnate -methylene blue (injected into a vein) -pimozide -thioridazine -ziprasidone This medicine may also interact with the following medications: -alcohol -amphetamines -aspirin and aspirin-like medicines -carbamazepine -certain medicines for depression, anxiety, or psychotic disturbances -certain medicines for migraine headache like almotriptan, eletriptan, frovatriptan, naratriptan, rizatriptan, sumatriptan, zolmitriptan -certain medicines for sleep -certain medicines that treat or prevent blood clots like warfarin, enoxaparin, dalteparin -cimetidine -diuretics -fentanyl -furazolidone -isoniazid -lithium -metoprolol -NSAIDs, medicines for pain and inflammation, like ibuprofen or naproxen -other medicines that prolong the QT interval (cause an abnormal heart rhythm) -procarbazine -rasagiline -supplements like St. John's wort, kava kava, valerian -tramadol -tryptophan This list may not describe  all possible interactions. Give your health care provider a list of all the medicines, herbs, non-prescription drugs, or dietary supplements you use. Also tell them if you smoke, drink alcohol, or use illegal drugs. Some items may interact with your medicine. What should I watch for while using this medicine? Tell your doctor if your symptoms do not get better or if they get worse. Visit your doctor or health care professional for regular checks on your progress. Because it may take several weeks to see the full effects of this medicine, it is important to continue your treatment as prescribed by your doctor. Patients and their families should watch out for new or worsening thoughts of suicide or depression. Also watch out for sudden changes in feelings such as feeling anxious, agitated, panicky, irritable, hostile, aggressive, impulsive, severely restless, overly excited and hyperactive, or not being able to sleep. If this happens, especially at the beginning of treatment or after a change in dose, call your health care professional. Dennis Bast may get drowsy or dizzy. Do not drive, use machinery, or do anything that needs mental alertness until you know how this medicine affects you. Do not stand or sit up quickly, especially if you are an older patient. This reduces the risk of dizzy or fainting spells. Alcohol may interfere with the effect of this medicine. Avoid alcoholic drinks. Your mouth may get dry. Chewing sugarless gum or sucking hard candy, and drinking plenty of water may help. Contact your doctor if the problem does not go away or is severe. What side effects may I notice from receiving this medicine? Side effects that you should report to your doctor or health care professional  as soon as possible: -allergic reactions like skin rash, itching or hives, swelling of the face, lips, or tongue -anxious -black, tarry stools -changes in vision -confusion -elevated mood, decreased need for sleep, racing  thoughts, impulsive behavior -eye pain -fast, irregular heartbeat -feeling faint or lightheaded, falls -feeling agitated, angry, or irritable -hallucination, loss of contact with reality -loss of balance or coordination -loss of memory -painful or prolonged erections -restlessness, pacing, inability to keep still -seizures -stiff muscles -suicidal thoughts or other mood changes -trouble sleeping -unusual bleeding or bruising -unusually weak or tired -vomiting Side effects that usually do not require medical attention (report to your doctor or health care professional if they continue or are bothersome): -changes in appetite -change in sex drive or performance -headache -increased sweating -indigestion, nausea -tremors This list may not describe all possible side effects. Call your doctor for medical advice about side effects. You may report side effects to FDA at 1-800-FDA-1088. Where should I keep my medicine? Keep out of reach of children. Store at room temperature between 15 and 30 degrees C (59 and 86 degrees F). Throw away any unused medicine after the expiration date. NOTE: This sheet is a summary. It may not cover all possible information. If you have questions about this medicine, talk to your doctor, pharmacist, or health care provider.  2018 Elsevier/Gold Standard (2016-01-06 13:20:23)

## 2017-02-05 NOTE — Progress Notes (Signed)
Subjective:  Patient ID: Patricia Davila, female    DOB: 20-Aug-1970  Age: 46 y.o. MRN: [id number]  CC: depression  HPI Patricia Davila is a 46 y.o. female with a PMH of asthma, HTN, bilateral hip pain, and depression presents with complaint of depression caused by Topiramate. Has recently taken Topiramate for headaches and feels that she became much more depressed. Often cried and screamed while in her home. She only wanted to stay home sleeping. Symptoms have resolved since she stopped Topiramate. She also reports increased stress with husband's hospitalization for thyroidectomy. Also has three adolescents at home that also cause stress as they are "beginning their teenage years". Patient had previously taken Zoloft, Xyprexa, and others she can't remember at the moment. Overall, she is feeling much better without Topiramate but recognizes that she has underlying depression. She is amenable to psychological help but does not want to do so at this time because she already has so many appointments for her hip problem. Is awaiting the appointment for her hip MRI. 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      ROS Review of Systems  Constitutional: Negative for chills, fever and malaise/fatigue.  Eyes: Negative for blurred vision.  Respiratory: Negative for shortness of breath.   Cardiovascular: Negative for chest pain and palpitations.  Gastrointestinal: Negative for abdominal pain and nausea.  Genitourinary: Negative for dysuria and hematuria.  Musculoskeletal: Positive for joint pain. Negative for myalgias.  Skin: Negative for rash.  Neurological: Negative for tingling and headaches.  Psychiatric/Behavioral: Positive for depression. The patient is not nervous/anxious.     Objective:  BP 120/78 (BP Location: Left Arm, Patient Position: Sitting, Cuff Size: Large)   Pulse 68   Temp 98.8 F (37.1 C) (Oral)   Resp 18   Ht 5\' 1"  (1.549 m)   Wt 185 lb (83.9 kg)   LMP 01/19/2017    SpO2 99%   BMI 34.96 kg/m   BP/Weight 02/05/2017 01/19/2017 2/58/5277  Systolic BP 824 235 361  Diastolic BP 78 91 97  Wt. (Lbs) 185 185 -  BMI 34.96 34.96 -      Physical Exam  Constitutional: She is oriented to person, place, and time.  Well developed, overweight, NAD, polite  HENT:  Head: Normocephalic and atraumatic.  Eyes: No scleral icterus.  Neck: Normal range of motion. Neck supple.  Cardiovascular: Normal rate, regular rhythm and normal heart sounds.   Pulmonary/Chest: Effort normal and breath sounds normal.  Musculoskeletal: She exhibits no edema.  Abnormal gait due to bilateral hip pain  Neurological: She is alert and oriented to person, place, and time. No cranial nerve deficit. Coordination normal.  Skin: Skin is warm and dry. No rash noted. No erythema. No pallor.  Psychiatric: She has a normal mood and affect. Her behavior is normal. Thought content normal.  Vitals reviewed.    Assessment & Plan:   1. Moderate episode of recurrent major depressive disorder (HCC) - Begin escitalopram (LEXAPRO) 5 MG tablet; Take 1 tablet (5 mg total) by mouth daily.  Dispense: 30 tablet; Refill: 3 - Pt declines psychology referral at this time, see HPI.  2. Hip pain, bilateral - Refill traMADol (ULTRAM) 50 MG tablet; Take 1 tablet (50 mg total) by mouth every 6 (six) hours as needed.  Dispense: 30 tablet; Refill: 0  3. Gastroesophageal reflux disease without esophagitis - Refill omeprazole (PRILOSEC) 40 MG capsule; Take 1 capsule (40 mg total) by mouth daily.  Dispense: 30 capsule; Refill: 3  5. Essential hypertension, benign - Refill hydrochlorothiazide (HYDRODIURIL) 25 MG tablet; Take 1 tablet (25 mg total) by mouth daily. Take on tablet in the morning.  Dispense: 90 tablet; Refill: 1  6. High risk medication use - Creatinine serum. Will be having a contrast enhanced MR and radiology requested a creatinine less than six weeks old.  Meds ordered this encounter   Medications  . escitalopram (LEXAPRO) 5 MG tablet    Sig: Take 1 tablet (5 mg total) by mouth daily.    Dispense:  30 tablet    Refill:  3    Order Specific Question:   Supervising Provider    Answer:   Tresa Garter [id number]  . traMADol (ULTRAM) 50 MG tablet    Sig: Take 1 tablet (50 mg total) by mouth every 6 (six) hours as needed.    Dispense:  30 tablet    Refill:  0    Order Specific Question:   Supervising Provider    Answer:   Tresa Garter [id number]  . omeprazole (PRILOSEC) 40 MG capsule    Sig: Take 1 capsule (40 mg total) by mouth daily.    Dispense:  30 capsule    Refill:  3    Order Specific Question:   Supervising Provider    Answer:   Tresa Garter [id number]  . hydrochlorothiazide (HYDRODIURIL) 25 MG tablet    Sig: Take 1 tablet (25 mg total) by mouth daily. Take on tablet in the morning.    Dispense:  90 tablet    Refill:  1    Order Specific Question:   Supervising Provider    Answer:   Tresa Garter [id number]    Follow-up: Return in about 2 months (around 04/07/2017), or if symptoms worsen or fail to improve.   Clent Demark PA

## 2017-02-06 LAB — CREATININE, SERUM
Creatinine, Ser: 0.86 mg/dL (ref 0.57–1.00)
GFR, EST AFRICAN AMERICAN: 94 mL/min/{1.73_m2} (ref >59)
GFR, EST NON AFRICAN AMERICAN: 81 mL/min/{1.73_m2} (ref >59)

## 2017-02-10 ENCOUNTER — Telehealth (INDEPENDENT_AMBULATORY_CARE_PROVIDER_SITE_OTHER): Payer: Self-pay

## 2017-02-10 NOTE — Telephone Encounter (Signed)
Left message that it is ok for her to go ahead and have her MRI done as serum creatinine levels are normal. Nat Christen, CMA

## 2017-02-12 ENCOUNTER — Ambulatory Visit (HOSPITAL_COMMUNITY)
Admission: RE | Admit: 2017-02-12 | Discharge: 2017-02-12 | Disposition: A | Discharge status: Home / Self Care | Payer: Medicare Other | Source: Ambulatory Visit | Attending: Physician Assistant | Admitting: Physician Assistant

## 2017-02-12 ENCOUNTER — Ambulatory Visit (HOSPITAL_COMMUNITY): Payer: Medicare Other

## 2017-02-12 DIAGNOSIS — M25552 Pain in left hip: Secondary | ICD-10-CM | POA: Diagnosis present

## 2017-02-12 DIAGNOSIS — M25551 Pain in right hip: Secondary | ICD-10-CM | POA: Insufficient documentation

## 2017-02-12 DIAGNOSIS — M25751 Osteophyte, right hip: Secondary | ICD-10-CM | POA: Diagnosis not present

## 2017-02-12 DIAGNOSIS — M1611 Unilateral primary osteoarthritis, right hip: Secondary | ICD-10-CM | POA: Insufficient documentation

## 2017-02-12 MED ORDER — GADOBENATE DIMEGLUMINE 529 MG/ML IV SOLN
20.0000 mL | Freq: Once | INTRAVENOUS | Status: AC
  Administered 2017-02-12: 18 mL via INTRAVENOUS

## 2017-02-16 ENCOUNTER — Ambulatory Visit (INDEPENDENT_AMBULATORY_CARE_PROVIDER_SITE_OTHER): Payer: Medicare Other | Admitting: Orthopaedic Surgery

## 2017-02-16 ENCOUNTER — Encounter (INDEPENDENT_AMBULATORY_CARE_PROVIDER_SITE_OTHER): Payer: Self-pay | Admitting: Orthopaedic Surgery

## 2017-02-16 DIAGNOSIS — M1612 Unilateral primary osteoarthritis, left hip: Secondary | ICD-10-CM

## 2017-02-16 DIAGNOSIS — M1611 Unilateral primary osteoarthritis, right hip: Secondary | ICD-10-CM

## 2017-02-16 NOTE — Progress Notes (Signed)
Office Visit Note   Patient: Patricia Davila           Date of Birth: Mar 03, 1971           MRN: [id number] Visit Date: 02/16/2017              Requested by: Clent Demark, PA-C [place], [place] [address] PCP: Clent Demark, PA-C   Assessment & Plan: Visit Diagnoses:  1. Primary osteoarthritis of left hip   2. Primary osteoarthritis of right hip     Plan: I reviewed x-rays which does show advanced degenerative joint disease of both hips worse on the left. I think should be excellent candidate for bilateral hip replacements. She is healthy and without any significant medical comorbidities. Her hypertension is well controlled. She has no history of DVT. She would like to have this done in early August. I plan on starting with the left hip first and if she stable then we would move onto the right one. We discussed the risks benefits and alternatives to surgery and the anticipated recovery time and she understands and wishes to proceed.  Follow-Up Instructions: Return if symptoms worsen or fail to improve.   Orders:  No orders of the defined types were placed in this encounter.  No orders of the defined types were placed in this encounter.     Procedures: No procedures performed   Clinical Data: No additional findings.   Subjective: Chief Complaint  Patient presents with  . Right Hip - Follow-up    MRI Review  . Left Hip - Follow-up    MRI Review    [name] follows up today for bilateral hip pain. She recently had an MRI that was ordered by her primary care doctor is a second opinion. This MRI showed severe degenerative joint disease of bilateral hips worse on the left. She continues to have severe pain in both hips with limitation in activity and quality with ADLs. She denies history of DVT.    Review of Systems  Constitutional: Negative.   HENT: Negative.   Eyes: Negative.   Respiratory: Negative.   Cardiovascular: Negative.   Endocrine:  Negative.   Musculoskeletal: Negative.   Neurological: Negative.   Hematological: Negative.   Psychiatric/Behavioral: Negative.   All other systems reviewed and are negative.    Objective: Vital Signs: LMP 01/19/2017   Physical Exam  Constitutional: She is oriented to person, place, and time. She appears well-developed and well-nourished.  Pulmonary/Chest: Effort normal.  Neurological: She is alert and oriented to person, place, and time.  Skin: Skin is warm. Capillary refill takes less than 2 seconds.  Psychiatric: She has a normal mood and affect. Her behavior is normal. Judgment and thought content normal.  Nursing note and vitals reviewed.   Ortho Exam Bilateral hip exam is stable. Specialty Comments:  No specialty comments available.  Imaging: No results found.   PMFS History: Patient Active Problem List   Diagnosis Date Noted  . Primary osteoarthritis of left hip 12/22/2016  . Major depressive disorder 10/24/2012  . Smokes tobacco daily 05/13/2010  . Headache(784.0) 05/13/2010  . Microalbuminuria 04/16/2009  . GERD 11/22/2008  . Left hip pain 11/22/2008  . Bipolar 1 disorder, depressed ([place]) 05/17/2007  . Lumbosacral degenerative disk disease 03/23/2005  . Essential hypertension, benign 08/18/2003   Past Medical History:  Diagnosis Date  . Asthma   . Hypertension     Family History  Problem Relation Age of Onset  .  Colon cancer Mother   . Colon cancer Sister     Past Surgical History:  Procedure Laterality Date  . LAMINECTOMY AND MICRODISCECTOMY LUMBAR SPINE   06/27/2010  . TUBAL LIGATION Bilateral    Social History   Occupational History  . Not on file.   Social History Main Topics  . Smoking status: Current Every Day Smoker    Packs/day: 0.25    Types: Cigarettes  . Smokeless tobacco: Never Used  . Alcohol use No  . Drug use: No  . Sexual activity: Yes    Birth control/ protection: None

## 2017-03-12 ENCOUNTER — Other Ambulatory Visit (INDEPENDENT_AMBULATORY_CARE_PROVIDER_SITE_OTHER): Payer: Self-pay | Admitting: Orthopaedic Surgery

## 2017-03-12 DIAGNOSIS — M160 Bilateral primary osteoarthritis of hip: Secondary | ICD-10-CM

## 2017-03-19 ENCOUNTER — Other Ambulatory Visit (INDEPENDENT_AMBULATORY_CARE_PROVIDER_SITE_OTHER): Payer: Self-pay | Admitting: Orthopaedic Surgery

## 2017-03-19 NOTE — Pre-Procedure Instructions (Signed)
Patricia Davila  03/19/2017      [facility], [place] [address] Phone: [phone] Fax: [phone]    Your procedure is scheduled on Thursday, August 9th   Report to [facility] Admitting at  10:15AM             (posted surgery time 12:15p - 3:15p)   Call this number if you have problems the morning of surgery:  [phone], for other questions call [phone] Mon-Fri from 8a-4p   Remember:  Do not eat food or drink liquids after midnight Wednesday.              4-5 days prior to surgery, STOP TAKING ANY vitamins, herbal supplements, anti-inflammatories.   Take these medicines the morning of surgery with A SIP OF WATER :  Omeprazole, Tramadol   Do not wear jewelry, make-up or nail polish.  Do not wear lotions, powders, perfumes, or deoderant.  Do not shave 48 hours prior to surgery.      Do not bring valuables to the hospital.  [facility] is not responsible for any belongings or valuables.  Contacts, dentures or bridgework may not be worn into surgery.  Leave your suitcase in the car.  After surgery it may be brought to your room.  For patients admitted to the hospital, discharge time will be determined by your treatment team.  Please read over the following fact sheets that you were given. Pain Booklet, MRSA Information and Surgical Site Infection Prevention      [facility]- Preparing For Surgery  Before surgery, you can play an important role. Because skin is not sterile, your skin needs to be as free of germs as possible. You can reduce the number of germs on your skin by washing with CHG (chlorahexidine gluconate) Soap before surgery.  CHG is an antiseptic cleaner which kills germs and bonds with the skin to continue killing germs even after washing.  Please do not use if you have an allergy to CHG or antibacterial soaps. If your skin becomes reddened/irritated stop  using the CHG.  Do not shave (including legs and underarms) for at least 48 hours prior to first CHG shower. It is OK to shave your face.  Please follow these instructions carefully.   1. Shower the NIGHT BEFORE SURGERY and the MORNING OF SURGERY with CHG.   2. If you chose to wash your hair, wash your hair first as usual with your normal shampoo.  3. After you shampoo, rinse your hair and body thoroughly to remove the shampoo.  4. Use CHG as you would any other liquid soap. You can apply CHG directly to the skin and wash gently with a scrungie or a clean washcloth.   5. Apply the CHG Soap to your body ONLY FROM THE NECK DOWN.  Do not use on open wounds or open sores. Avoid contact with your eyes, ears, mouth and genitals (private parts). Wash genitals (private parts) with your normal soap.  6. Wash thoroughly, paying special attention to the area where your surgery will be performed.  7. Thoroughly rinse your body with warm water from the neck down.  8. DO NOT shower/wash with your normal soap after using and rinsing off the CHG Soap.  9. Pat yourself dry with a CLEAN TOWEL.   10. Wear CLEAN PAJAMAS   11. Place CLEAN SHEETS on your bed the night of your  first shower and DO NOT SLEEP WITH PETS.    Day of Surgery: Do not apply any deodorants/lotions. Please wear clean clothes to the hospital/surgery center.

## 2017-03-22 ENCOUNTER — Inpatient Hospital Stay (HOSPITAL_COMMUNITY)
Admission: RE | Admit: 2017-03-22 | Discharge: 2017-03-22 | Disposition: A | Discharge status: Home / Self Care | Payer: Medicare Other | Source: Ambulatory Visit

## 2017-03-23 ENCOUNTER — Encounter (HOSPITAL_COMMUNITY)
Admission: RE | Admit: 2017-03-23 | Discharge: 2017-03-23 | Disposition: A | Discharge status: Home / Self Care | Payer: Medicare Other | Source: Ambulatory Visit | Attending: Orthopaedic Surgery | Admitting: Orthopaedic Surgery

## 2017-03-23 ENCOUNTER — Encounter (HOSPITAL_COMMUNITY): Payer: Self-pay

## 2017-03-23 DIAGNOSIS — Z01812 Encounter for preprocedural laboratory examination: Secondary | ICD-10-CM | POA: Insufficient documentation

## 2017-03-23 DIAGNOSIS — I10 Essential (primary) hypertension: Secondary | ICD-10-CM

## 2017-03-23 DIAGNOSIS — Z01810 Encounter for preprocedural cardiovascular examination: Secondary | ICD-10-CM | POA: Insufficient documentation

## 2017-03-23 HISTORY — DX: Pneumonia, unspecified organism: J18.9

## 2017-03-23 HISTORY — DX: Bipolar disorder, unspecified: F31.9

## 2017-03-23 HISTORY — DX: Anxiety disorder, unspecified: F41.9

## 2017-03-23 HISTORY — DX: Gastro-esophageal reflux disease without esophagitis: K21.9

## 2017-03-23 HISTORY — DX: Unspecified osteoarthritis, unspecified site: M19.90

## 2017-03-23 HISTORY — DX: Nausea with vomiting, unspecified: R11.2

## 2017-03-23 HISTORY — DX: Headache, unspecified: R51.9

## 2017-03-23 HISTORY — DX: Anemia, unspecified: D64.9

## 2017-03-23 HISTORY — DX: Restless legs syndrome: G25.81

## 2017-03-23 HISTORY — DX: Major depressive disorder, single episode, unspecified: F32.9

## 2017-03-23 HISTORY — DX: Headache: R51

## 2017-03-23 HISTORY — DX: Depression, unspecified: F32.A

## 2017-03-23 HISTORY — DX: Nausea with vomiting, unspecified: Z98.890

## 2017-03-23 LAB — CBC WITH DIFFERENTIAL/PLATELET
BASOS ABS: 0 10*3/uL (ref 0.0–0.1)
BASOS PCT: 0 %
EOS ABS: 0.2 10*3/uL (ref 0.0–0.7)
EOS PCT: 2 %
HCT: 34.8 % — ABNORMAL LOW (ref 36.0–46.0)
Hemoglobin: 11.8 g/dL — ABNORMAL LOW (ref 12.0–15.0)
Lymphocytes Relative: 25 %
Lymphs Abs: 2.2 10*3/uL (ref 0.7–4.0)
MCH: 30 pg (ref 26.0–34.0)
MCHC: 33.9 g/dL (ref 30.0–36.0)
MCV: 88.5 fL (ref 78.0–100.0)
MONO ABS: 0.5 10*3/uL (ref 0.1–1.0)
Monocytes Relative: 6 %
Neutro Abs: 6 10*3/uL (ref 1.7–7.7)
Neutrophils Relative %: 67 %
PLATELETS: 282 10*3/uL (ref 150–400)
RBC: 3.93 MIL/uL (ref 3.87–5.11)
RDW: 15.2 % (ref 11.5–15.5)
WBC: 8.8 10*3/uL (ref 4.0–10.5)

## 2017-03-23 LAB — URINALYSIS, ROUTINE W REFLEX MICROSCOPIC
BILIRUBIN URINE: NEGATIVE
GLUCOSE, UA: NEGATIVE mg/dL
Hgb urine dipstick: NEGATIVE
KETONES UR: NEGATIVE mg/dL
Leukocytes, UA: NEGATIVE
NITRITE: NEGATIVE
PH: 5 (ref 5.0–8.0)
PROTEIN: NEGATIVE mg/dL
Specific Gravity, Urine: 1.019 (ref 1.005–1.030)

## 2017-03-23 LAB — COMPREHENSIVE METABOLIC PANEL
ALBUMIN: 3.9 g/dL (ref 3.5–5.0)
ALT: 12 U/L — AB (ref 14–54)
AST: 17 U/L (ref 15–41)
Alkaline Phosphatase: 65 U/L (ref 38–126)
Anion gap: 7 (ref 5–15)
BUN: 9 mg/dL (ref 6–20)
CHLORIDE: 103 mmol/L (ref 101–111)
CO2: 27 mmol/L (ref 22–32)
CREATININE: 0.85 mg/dL (ref 0.44–1.00)
Calcium: 8.9 mg/dL (ref 8.9–10.3)
GFR calc Af Amer: >60 mL/min (ref >60)
Glucose, Bld: 100 mg/dL — ABNORMAL HIGH (ref 65–99)
POTASSIUM: 3.3 mmol/L — AB (ref 3.5–5.1)
SODIUM: 137 mmol/L (ref 135–145)
Total Bilirubin: 0.5 mg/dL (ref 0.3–1.2)
Total Protein: 6.4 g/dL — ABNORMAL LOW (ref 6.5–8.1)

## 2017-03-23 LAB — PROTIME-INR
INR: 0.94
PROTHROMBIN TIME: 12.6 s (ref 11.4–15.2)

## 2017-03-23 LAB — HCG, SERUM, QUALITATIVE: Preg, Serum: NEGATIVE

## 2017-03-23 LAB — TYPE AND SCREEN
ABO/RH(D): B POS
ANTIBODY SCREEN: NEGATIVE

## 2017-03-23 LAB — ABO/RH: ABO/RH(D): B POS

## 2017-03-23 LAB — APTT: APTT: 26 s (ref 24–36)

## 2017-03-23 LAB — C-REACTIVE PROTEIN

## 2017-03-23 LAB — SURGICAL PCR SCREEN
MRSA, PCR: NEGATIVE
Staphylococcus aureus: POSITIVE — AB

## 2017-03-23 LAB — SEDIMENTATION RATE: SED RATE: 11 mm/h (ref 0–22)

## 2017-03-23 NOTE — Progress Notes (Signed)
Pt denies cardiac history, chest pain or sob. Pt states she is not diabetic. 

## 2017-03-23 NOTE — Pre-Procedure Instructions (Signed)
Patricia Davila  03/23/2017     Your procedure is scheduled on Thursday, March 25, 2017 at 12:15 PM.   Report to [facility] Entrance "A" Admitting Office at  10:15 AM.             Call this number if you have problems the morning of surgery: [phone]   Questions prior to day of surgery, please call [phone] between 8 & 4 PM.   Remember:  Do not eat food or drink liquids after midnight Wednesday.             Take these medicines the morning of surgery with A SIP OF WATER: Omeprazole (Prilosec), Tramadol - if needed  Stop NSAIDS (Ibuprofen, Naproxen, Aleve, etc) as of today. Do not use any Aspirin products prior to surgery.   Do not wear jewelry, make-up or nail polish.  Do not wear lotions, powders, perfumes or deodorant.  Do not shave 48 hours prior to surgery.     Do not bring valuables to the hospital.  [facility] is not responsible for any belongings or valuables.  Contacts, dentures or bridgework may not be worn into surgery.  Leave your suitcase in the car.  After surgery it may be brought to your room.  For patients admitted to the hospital, discharge time will be determined by your treatment team.   [facility]- Preparing For Surgery  Before surgery, you can play an important role. Because skin is not sterile, your skin needs to be as free of germs as possible. You can reduce the number of germs on your skin by washing with CHG (chlorahexidine gluconate) Soap before surgery.  CHG is an antiseptic cleaner which kills germs and bonds with the skin to continue killing germs even after washing.  Please do not use if you have an allergy to CHG or antibacterial soaps. If your skin becomes reddened/irritated stop using the CHG.  Do not shave (including legs and underarms) for at least 48 hours prior to first CHG shower. It is OK to shave your face.  Please follow these instructions carefully.   1. Shower the NIGHT BEFORE SURGERY and the MORNING OF SURGERY with  CHG.   2. If you chose to wash your hair, wash your hair first as usual with your normal shampoo.  3. After you shampoo, rinse your hair and body thoroughly to remove the shampoo.  4. Use CHG as you would any other liquid soap. You can apply CHG directly to the skin and wash gently with a scrungie or a clean washcloth.   5. Apply the CHG Soap to your body ONLY FROM THE NECK DOWN.  Do not use on open wounds or open sores. Avoid contact with your eyes, ears, mouth and genitals (private parts). Wash genitals (private parts) with your normal soap.  6. Wash thoroughly, paying special attention to the area where your surgery will be performed.  7. Thoroughly rinse your body with warm water from the neck down.  8. DO NOT shower/wash with your normal soap after using and rinsing off the CHG Soap.  9. Pat yourself dry with a CLEAN TOWEL.   10. Wear CLEAN PAJAMAS   11. Place CLEAN SHEETS on your bed the night of your first shower and DO NOT SLEEP WITH PETS.    Day of Surgery: Shower. Do not apply any deodorants/lotions. Please wear clean clothes to the hospital.    Please read over the fact sheets that you were given.

## 2017-03-24 MED ORDER — TRANEXAMIC ACID 1000 MG/10ML IV SOLN
1000.0000 mg | INTRAVENOUS | Status: AC
  Administered 2017-03-25: 1000 mg via INTRAVENOUS
  Filled 2017-03-24: qty 10

## 2017-03-25 ENCOUNTER — Inpatient Hospital Stay (HOSPITAL_COMMUNITY)
Admission: RE | Admit: 2017-03-25 | Discharge: 2017-03-29 | DRG: 462 | Disposition: A | Discharge status: Home / Self Care | Payer: Medicare Other | Source: Ambulatory Visit | Attending: Orthopaedic Surgery | Admitting: Orthopaedic Surgery

## 2017-03-25 ENCOUNTER — Encounter (HOSPITAL_COMMUNITY): Payer: Self-pay | Admitting: Urology

## 2017-03-25 ENCOUNTER — Inpatient Hospital Stay (HOSPITAL_COMMUNITY): Payer: Medicare Other

## 2017-03-25 ENCOUNTER — Encounter (HOSPITAL_COMMUNITY)
Admission: RE | Disposition: A | Discharge status: Home / Self Care | Payer: Self-pay | Source: Ambulatory Visit | Attending: Orthopaedic Surgery

## 2017-03-25 ENCOUNTER — Inpatient Hospital Stay (HOSPITAL_COMMUNITY): Payer: Medicare Other | Admitting: Certified Registered"

## 2017-03-25 DIAGNOSIS — Z9851 Tubal ligation status: Secondary | ICD-10-CM

## 2017-03-25 DIAGNOSIS — M160 Bilateral primary osteoarthritis of hip: Secondary | ICD-10-CM | POA: Diagnosis not present

## 2017-03-25 DIAGNOSIS — M1612 Unilateral primary osteoarthritis, left hip: Secondary | ICD-10-CM | POA: Diagnosis not present

## 2017-03-25 DIAGNOSIS — Z830 Family history of human immunodeficiency virus [HIV] disease: Secondary | ICD-10-CM | POA: Diagnosis not present

## 2017-03-25 DIAGNOSIS — Z419 Encounter for procedure for purposes other than remedying health state, unspecified: Secondary | ICD-10-CM

## 2017-03-25 DIAGNOSIS — F17210 Nicotine dependence, cigarettes, uncomplicated: Secondary | ICD-10-CM | POA: Diagnosis not present

## 2017-03-25 DIAGNOSIS — Z800 Family history of malignant neoplasm of digestive organs: Secondary | ICD-10-CM | POA: Diagnosis not present

## 2017-03-25 DIAGNOSIS — M1611 Unilateral primary osteoarthritis, right hip: Secondary | ICD-10-CM

## 2017-03-25 DIAGNOSIS — Z96649 Presence of unspecified artificial hip joint: Secondary | ICD-10-CM

## 2017-03-25 DIAGNOSIS — Z79899 Other long term (current) drug therapy: Secondary | ICD-10-CM

## 2017-03-25 DIAGNOSIS — M259 Joint disorder, unspecified: Secondary | ICD-10-CM

## 2017-03-25 DIAGNOSIS — Z888 Allergy status to other drugs, medicaments and biological substances status: Secondary | ICD-10-CM | POA: Diagnosis not present

## 2017-03-25 HISTORY — PX: BILATERAL ANTERIOR TOTAL HIP ARTHROPLASTY: SHX5567

## 2017-03-25 SURGERY — ARTHROPLASTY, HIP, BILATERAL, TOTAL, ANTERIOR APPROACH
Anesthesia: Epidural | Site: Hip | Laterality: Bilateral

## 2017-03-25 MED ORDER — METOCLOPRAMIDE HCL 5 MG PO TABS
5.0000 mg | ORAL_TABLET | Freq: Three times a day (TID) | ORAL | Status: DC | PRN
  Administered 2017-03-27: 10 mg via ORAL
  Filled 2017-03-25: qty 2

## 2017-03-25 MED ORDER — PHENOL 1.4 % MT LIQD: 1.0000 | OROMUCOSAL | Status: DC | PRN

## 2017-03-25 MED ORDER — DEXAMETHASONE SODIUM PHOSPHATE 10 MG/ML IJ SOLN
10.0000 mg | Freq: Once | INTRAMUSCULAR | Status: AC
  Administered 2017-03-27: 10 mg via INTRAVENOUS
  Filled 2017-03-25: qty 1

## 2017-03-25 MED ORDER — SODIUM CHLORIDE 0.9 % IV SOLN
2000.0000 mg | INTRAVENOUS | Status: AC
  Administered 2017-03-25: 2000 mg via TOPICAL
  Filled 2017-03-25: qty 20

## 2017-03-25 MED ORDER — METOCLOPRAMIDE HCL 5 MG/ML IJ SOLN
5.0000 mg | Freq: Three times a day (TID) | INTRAMUSCULAR | Status: DC | PRN
  Administered 2017-03-26 – 2017-03-28 (×4): 10 mg via INTRAVENOUS
  Filled 2017-03-25 (×4): qty 2

## 2017-03-25 MED ORDER — KETOROLAC TROMETHAMINE 15 MG/ML IJ SOLN
INTRAMUSCULAR | Status: AC
  Filled 2017-03-25: qty 1

## 2017-03-25 MED ORDER — MORPHINE SULFATE (PF) 4 MG/ML IV SOLN
1.0000 mg | INTRAVENOUS | Status: DC | PRN
  Administered 2017-03-25 – 2017-03-29 (×5): 1 mg via INTRAVENOUS
  Filled 2017-03-25 (×5): qty 1

## 2017-03-25 MED ORDER — PROPOFOL 10 MG/ML IV BOLUS
INTRAVENOUS | Status: DC | PRN
  Administered 2017-03-25: 40 mg via INTRAVENOUS
  Administered 2017-03-25: 20 mg via INTRAVENOUS
  Administered 2017-03-25: 50 mg via INTRAVENOUS
  Administered 2017-03-25: 20 mg via INTRAVENOUS

## 2017-03-25 MED ORDER — MIDAZOLAM HCL 5 MG/5ML IJ SOLN
INTRAMUSCULAR | Status: DC | PRN
  Administered 2017-03-25: 2 mg via INTRAVENOUS

## 2017-03-25 MED ORDER — PHENYLEPHRINE 40 MCG/ML (10ML) SYRINGE FOR IV PUSH (FOR BLOOD PRESSURE SUPPORT)
PREFILLED_SYRINGE | INTRAVENOUS | Status: DC | PRN
  Administered 2017-03-25 (×2): 80 ug via INTRAVENOUS

## 2017-03-25 MED ORDER — DIPHENHYDRAMINE HCL 12.5 MG/5ML PO ELIX: 25.0000 mg | ORAL_SOLUTION | ORAL | Status: DC | PRN

## 2017-03-25 MED ORDER — SORBITOL 70 % SOLN: 30.0000 mL | Freq: Every day | Status: DC | PRN

## 2017-03-25 MED ORDER — MIDAZOLAM HCL 2 MG/2ML IJ SOLN
INTRAMUSCULAR | Status: AC
  Filled 2017-03-25: qty 2

## 2017-03-25 MED ORDER — PROMETHAZINE HCL 25 MG PO TABS: 25.0000 mg | ORAL_TABLET | Freq: Four times a day (QID) | ORAL | 1 refills | Status: DC | PRN

## 2017-03-25 MED ORDER — PHENYLEPHRINE HCL 10 MG/ML IJ SOLN
INTRAVENOUS | Status: DC | PRN
  Administered 2017-03-25: 30 ug/min via INTRAVENOUS

## 2017-03-25 MED ORDER — FENTANYL CITRATE (PF) 100 MCG/2ML IJ SOLN
INTRAMUSCULAR | Status: DC | PRN
Start: 2017-03-25 — End: 2017-03-25
  Administered 2017-03-25: 100 ug via INTRAVENOUS
  Administered 2017-03-25: 50 ug via INTRAVENOUS

## 2017-03-25 MED ORDER — OXYCODONE HCL ER 10 MG PO T12A: 10.0000 mg | EXTENDED_RELEASE_TABLET | Freq: Two times a day (BID) | ORAL | 0 refills | Status: DC

## 2017-03-25 MED ORDER — METHOCARBAMOL 500 MG PO TABS
ORAL_TABLET | ORAL | Status: AC
  Filled 2017-03-25: qty 1

## 2017-03-25 MED ORDER — LIDOCAINE 2% (20 MG/ML) 5 ML SYRINGE
INTRAMUSCULAR | Status: DC | PRN
  Administered 2017-03-25: 50 mg via INTRAVENOUS

## 2017-03-25 MED ORDER — 0.9 % SODIUM CHLORIDE (POUR BTL) OPTIME
TOPICAL | Status: DC | PRN
  Administered 2017-03-25: 1000 mL

## 2017-03-25 MED ORDER — MENTHOL 3 MG MT LOZG: 1.0000 | LOZENGE | OROMUCOSAL | Status: DC | PRN

## 2017-03-25 MED ORDER — MIDAZOLAM HCL 2 MG/2ML IJ SOLN
INTRAMUSCULAR | Status: AC
  Administered 2017-03-25: 1 mg
  Filled 2017-03-25: qty 2

## 2017-03-25 MED ORDER — METHOCARBAMOL 500 MG PO TABS
500.0000 mg | ORAL_TABLET | Freq: Four times a day (QID) | ORAL | Status: DC | PRN
  Administered 2017-03-25 – 2017-03-28 (×8): 500 mg via ORAL
  Filled 2017-03-25 (×9): qty 1

## 2017-03-25 MED ORDER — ACETAMINOPHEN 500 MG PO TABS
1000.0000 mg | ORAL_TABLET | Freq: Four times a day (QID) | ORAL | Status: AC
  Administered 2017-03-25 – 2017-03-26 (×2): 1000 mg via ORAL
  Filled 2017-03-25 (×3): qty 2

## 2017-03-25 MED ORDER — ONDANSETRON HCL 4 MG/2ML IJ SOLN
4.0000 mg | Freq: Four times a day (QID) | INTRAMUSCULAR | Status: DC | PRN
  Administered 2017-03-27 (×2): 4 mg via INTRAVENOUS
  Filled 2017-03-25 (×3): qty 2

## 2017-03-25 MED ORDER — FENTANYL CITRATE (PF) 250 MCG/5ML IJ SOLN
INTRAMUSCULAR | Status: AC
  Filled 2017-03-25: qty 5

## 2017-03-25 MED ORDER — SUMATRIPTAN SUCCINATE 25 MG PO TABS: 25.0000 mg | ORAL_TABLET | ORAL | Status: DC | PRN

## 2017-03-25 MED ORDER — METHOCARBAMOL 1000 MG/10ML IJ SOLN: 500.0000 mg | Freq: Four times a day (QID) | INTRAMUSCULAR | Status: DC | PRN

## 2017-03-25 MED ORDER — OXYCODONE HCL ER 15 MG PO T12A
15.0000 mg | EXTENDED_RELEASE_TABLET | Freq: Two times a day (BID) | ORAL | Status: DC
  Administered 2017-03-25 – 2017-03-29 (×5): 15 mg via ORAL
  Filled 2017-03-25 (×5): qty 1

## 2017-03-25 MED ORDER — OXYCODONE HCL 5 MG PO TABS
5.0000 mg | ORAL_TABLET | ORAL | Status: DC | PRN
  Administered 2017-03-25 – 2017-03-28 (×11): 15 mg via ORAL
  Administered 2017-03-28: 10 mg via ORAL
  Administered 2017-03-28 – 2017-03-29 (×5): 15 mg via ORAL
  Filled 2017-03-25 (×18): qty 3

## 2017-03-25 MED ORDER — PROPOFOL 10 MG/ML IV BOLUS
INTRAVENOUS | Status: AC
  Filled 2017-03-25: qty 20

## 2017-03-25 MED ORDER — ONDANSETRON HCL 4 MG/2ML IJ SOLN
INTRAMUSCULAR | Status: DC | PRN
  Administered 2017-03-25: 4 mg via INTRAVENOUS

## 2017-03-25 MED ORDER — OXYCODONE HCL 5 MG PO TABS: 5.0000 mg | ORAL_TABLET | ORAL | 0 refills | Status: DC | PRN

## 2017-03-25 MED ORDER — DEXAMETHASONE SODIUM PHOSPHATE 10 MG/ML IJ SOLN
INTRAMUSCULAR | Status: DC | PRN
  Administered 2017-03-25: 8 mg via INTRAVENOUS

## 2017-03-25 MED ORDER — RIVAROXABAN 10 MG PO TABS: 10.0000 mg | ORAL_TABLET | Freq: Every day | ORAL | 0 refills | Status: DC

## 2017-03-25 MED ORDER — RIVAROXABAN 10 MG PO TABS
10.0000 mg | ORAL_TABLET | Freq: Every day | ORAL | Status: DC
  Administered 2017-03-26 – 2017-03-29 (×4): 10 mg via ORAL
  Filled 2017-03-25 (×4): qty 1

## 2017-03-25 MED ORDER — CEFAZOLIN SODIUM-DEXTROSE 2-4 GM/100ML-% IV SOLN
2.0000 g | INTRAVENOUS | Status: AC
  Administered 2017-03-25: 2 g via INTRAVENOUS

## 2017-03-25 MED ORDER — ACETAMINOPHEN 650 MG RE SUPP: 650.0000 mg | Freq: Four times a day (QID) | RECTAL | Status: DC | PRN

## 2017-03-25 MED ORDER — ESCITALOPRAM OXALATE 10 MG PO TABS: 5.0000 mg | ORAL_TABLET | Freq: Every day | ORAL | Status: DC

## 2017-03-25 MED ORDER — SENNOSIDES-DOCUSATE SODIUM 8.6-50 MG PO TABS: 1.0000 | ORAL_TABLET | Freq: Every evening | ORAL | 1 refills | Status: DC | PRN

## 2017-03-25 MED ORDER — CEFAZOLIN SODIUM-DEXTROSE 2-4 GM/100ML-% IV SOLN
2.0000 g | Freq: Four times a day (QID) | INTRAVENOUS | Status: AC
  Administered 2017-03-25 – 2017-03-26 (×2): 2 g via INTRAVENOUS
  Filled 2017-03-25 (×2): qty 100

## 2017-03-25 MED ORDER — TRANEXAMIC ACID 1000 MG/10ML IV SOLN
1000.0000 mg | Freq: Once | INTRAVENOUS | Status: AC
  Administered 2017-03-25: 1000 mg via INTRAVENOUS
  Filled 2017-03-25: qty 10

## 2017-03-25 MED ORDER — ONDANSETRON HCL 4 MG PO TABS
4.0000 mg | ORAL_TABLET | Freq: Four times a day (QID) | ORAL | Status: DC | PRN
  Administered 2017-03-26 – 2017-03-28 (×4): 4 mg via ORAL
  Filled 2017-03-25 (×4): qty 1

## 2017-03-25 MED ORDER — CHLORHEXIDINE GLUCONATE 4 % EX LIQD: 60.0000 mL | Freq: Once | CUTANEOUS | Status: DC

## 2017-03-25 MED ORDER — LACTATED RINGERS IV SOLN
INTRAVENOUS | Status: DC | PRN
  Administered 2017-03-25 (×2): via INTRAVENOUS

## 2017-03-25 MED ORDER — CEFAZOLIN SODIUM-DEXTROSE 2-4 GM/100ML-% IV SOLN
INTRAVENOUS | Status: AC
  Filled 2017-03-25: qty 100

## 2017-03-25 MED ORDER — ONDANSETRON HCL 4 MG PO TABS: 4.0000 mg | ORAL_TABLET | Freq: Three times a day (TID) | ORAL | 0 refills | Status: DC | PRN

## 2017-03-25 MED ORDER — SODIUM CHLORIDE 0.9 % IR SOLN
Status: DC | PRN
  Administered 2017-03-25: 3000 mL

## 2017-03-25 MED ORDER — ALUM & MAG HYDROXIDE-SIMETH 200-200-20 MG/5ML PO SUSP
30.0000 mL | ORAL | Status: DC | PRN
  Administered 2017-03-28: 30 mL via ORAL
  Filled 2017-03-25: qty 30

## 2017-03-25 MED ORDER — PROPOFOL 500 MG/50ML IV EMUL
INTRAVENOUS | Status: DC | PRN
  Administered 2017-03-25: 50 ug/kg/min via INTRAVENOUS

## 2017-03-25 MED ORDER — NICOTINE 21 MG/24HR TD PT24
21.0000 mg | MEDICATED_PATCH | Freq: Every day | TRANSDERMAL | Status: DC
  Filled 2017-03-25: qty 1

## 2017-03-25 MED ORDER — MIDAZOLAM HCL 2 MG/2ML IJ SOLN
1.0000 mg | Freq: Once | INTRAMUSCULAR | Status: AC
  Administered 2017-03-25: 1 mg via INTRAVENOUS

## 2017-03-25 MED ORDER — KETOROLAC TROMETHAMINE 15 MG/ML IJ SOLN
30.0000 mg | Freq: Four times a day (QID) | INTRAMUSCULAR | Status: AC
  Administered 2017-03-25 – 2017-03-26 (×4): 30 mg via INTRAVENOUS
  Filled 2017-03-25 (×3): qty 2

## 2017-03-25 MED ORDER — LOSARTAN POTASSIUM 50 MG PO TABS
50.0000 mg | ORAL_TABLET | Freq: Every day | ORAL | Status: DC
  Administered 2017-03-26 – 2017-03-29 (×4): 50 mg via ORAL
  Filled 2017-03-25 (×4): qty 1

## 2017-03-25 MED ORDER — ACETAMINOPHEN 325 MG PO TABS
650.0000 mg | ORAL_TABLET | Freq: Four times a day (QID) | ORAL | Status: DC | PRN
  Administered 2017-03-28: 650 mg via ORAL
  Filled 2017-03-25: qty 2

## 2017-03-25 MED ORDER — BUPIVACAINE HCL (PF) 0.5 % IJ SOLN
INTRAMUSCULAR | Status: DC | PRN
  Administered 2017-03-25: 3 mL via INTRATHECAL
  Administered 2017-03-25: 5 mL

## 2017-03-25 MED ORDER — MAGNESIUM CITRATE PO SOLN: 1.0000 | Freq: Once | ORAL | Status: DC | PRN

## 2017-03-25 MED ORDER — POLYETHYLENE GLYCOL 3350 17 G PO PACK: 17.0000 g | PACK | Freq: Every day | ORAL | Status: DC | PRN

## 2017-03-25 MED ORDER — SODIUM CHLORIDE 0.9 % IV SOLN
INTRAVENOUS | Status: DC
  Administered 2017-03-26: via INTRAVENOUS

## 2017-03-25 MED ORDER — LACTATED RINGERS IV SOLN
INTRAVENOUS | Status: DC
  Administered 2017-03-25: 11:00:00 via INTRAVENOUS

## 2017-03-25 MED ORDER — METHOCARBAMOL 750 MG PO TABS: 750.0000 mg | ORAL_TABLET | Freq: Two times a day (BID) | ORAL | 0 refills | Status: DC | PRN

## 2017-03-25 SURGICAL SUPPLY — 66 items
BAG DECANTER FOR FLEXI CONT (MISCELLANEOUS) ×5 IMPLANT
CAPT HIP TOTAL 2 ×4 IMPLANT
CATH PULSESPRAY 5F 20CMX135CM (CATHETERS) ×2 IMPLANT
CELLS DAT CNTRL 66122 CELL SVR (MISCELLANEOUS) ×1 IMPLANT
CLOSURE STERI-STRIP 1/2X4 (GAUZE/BANDAGES/DRESSINGS) ×1
CLOSURE WOUND 1/2 X4 (GAUZE/BANDAGES/DRESSINGS) ×1
CLSR STERI-STRIP ANTIMIC 1/2X4 (GAUZE/BANDAGES/DRESSINGS) ×1 IMPLANT
COVER SURGICAL LIGHT HANDLE (MISCELLANEOUS) ×3 IMPLANT
DRAPE C-ARM 42X72 X-RAY (DRAPES) ×3 IMPLANT
DRAPE STERI IOBAN 125X83 (DRAPES) ×3 IMPLANT
DRAPE U-SHAPE 47X51 STRL (DRAPES) ×6 IMPLANT
DRSG AQUACEL AG ADV 3.5X10 (GAUZE/BANDAGES/DRESSINGS) ×5 IMPLANT
DURAPREP 26ML APPLICATOR (WOUND CARE) ×3 IMPLANT
ELECT BLADE 4.0 EZ CLEAN MEGAD (MISCELLANEOUS) ×3
ELECT REM PT RETURN 9FT ADLT (ELECTROSURGICAL) ×3
ELECTRODE BLDE 4.0 EZ CLN MEGD (MISCELLANEOUS) ×1 IMPLANT
ELECTRODE REM PT RTRN 9FT ADLT (ELECTROSURGICAL) ×1 IMPLANT
GLOVE BIO SURGEON STRL SZ8 (GLOVE) ×8 IMPLANT
GLOVE BIOGEL PI IND STRL 6.5 (GLOVE) IMPLANT
GLOVE BIOGEL PI IND STRL 8 (GLOVE) IMPLANT
GLOVE BIOGEL PI INDICATOR 6.5 (GLOVE) ×2
GLOVE BIOGEL PI INDICATOR 8 (GLOVE) ×2
GLOVE SKINSENSE NS SZ7.5 (GLOVE) ×2
GLOVE SKINSENSE STRL SZ7.5 (GLOVE) ×1 IMPLANT
GLOVE SURG SYN 7.5  E (GLOVE) ×10
GLOVE SURG SYN 7.5 E (GLOVE) ×5 IMPLANT
GLOVE SURG SYN 7.5 PF PI (GLOVE) ×2 IMPLANT
GOWN SRG XL XLNG 56XLVL 4 (GOWN DISPOSABLE) ×1 IMPLANT
GOWN STRL NON-REIN XL XLG LVL4 (GOWN DISPOSABLE) ×3
GOWN STRL REIN XL XLG (GOWN DISPOSABLE) ×2 IMPLANT
GOWN STRL REUS W/ TWL LRG LVL3 (GOWN DISPOSABLE) IMPLANT
GOWN STRL REUS W/ TWL XL LVL3 (GOWN DISPOSABLE) IMPLANT
GOWN STRL REUS W/TWL 2XL LVL3 (GOWN DISPOSABLE) ×4 IMPLANT
GOWN STRL REUS W/TWL LRG LVL3 (GOWN DISPOSABLE)
GOWN STRL REUS W/TWL XL LVL3 (GOWN DISPOSABLE) ×3
HANDPIECE INTERPULSE COAX TIP (DISPOSABLE) ×3
HOOD PEEL AWAY FACE SHEILD DIS (HOOD) ×10 IMPLANT
HOOD PEEL AWAY FLYTE STAYCOOL (MISCELLANEOUS) ×6 IMPLANT
IV NS IRRIG 3000ML ARTHROMATIC (IV SOLUTION) ×3 IMPLANT
KIT BASIN OR (CUSTOM PROCEDURE TRAY) ×3 IMPLANT
MARKER SKIN DUAL TIP RULER LAB (MISCELLANEOUS) ×3 IMPLANT
PACK TOTAL JOINT (CUSTOM PROCEDURE TRAY) ×3 IMPLANT
PACK UNIVERSAL I (CUSTOM PROCEDURE TRAY) ×3 IMPLANT
PEN SKIN MARKING BROAD (MISCELLANEOUS) ×4 IMPLANT
PENCIL BUTTON HOLSTER BLD 10FT (ELECTRODE) ×4 IMPLANT
RETRACTOR WND ALEXIS 18 MED (MISCELLANEOUS) ×1 IMPLANT
RTRCTR WOUND ALEXIS 18CM MED (MISCELLANEOUS) ×3
RTRCTR WOUND ALEXIS 18CM SML (INSTRUMENTS) ×3
SAVER CELL AAL HAEMONETICS (INSTRUMENTS) IMPLANT
SAW OSC TIP CART 19.5X105X1.3 (SAW) ×3 IMPLANT
SET HNDPC FAN SPRY TIP SCT (DISPOSABLE) ×1 IMPLANT
STAPLER VISISTAT 35W (STAPLE) IMPLANT
STRIP CLOSURE SKIN 1/2X4 (GAUZE/BANDAGES/DRESSINGS) ×1 IMPLANT
SUT ETHIBOND 2 V 37 (SUTURE) ×5 IMPLANT
SUT MNCRL AB 3-0 PS2 18 (SUTURE) ×4 IMPLANT
SUT VIC AB 0 CT1 27 (SUTURE) ×6
SUT VIC AB 0 CT1 27XBRD ANBCTR (SUTURE) IMPLANT
SUT VIC AB 1 CT1 27 (SUTURE) ×9
SUT VIC AB 1 CT1 27XBRD ANBCTR (SUTURE) ×1 IMPLANT
SUT VIC AB 2-0 CT1 27 (SUTURE) ×12
SUT VIC AB 2-0 CT1 TAPERPNT 27 (SUTURE) ×1 IMPLANT
TOWEL OR 17X26 10 PK STRL BLUE (TOWEL DISPOSABLE) ×3 IMPLANT
TRAY CATH 16FR W/PLASTIC CATH (SET/KITS/TRAYS/PACK) ×3 IMPLANT
TUBE CONNECTING 12'X1/4 (SUCTIONS) ×1
TUBE CONNECTING 12X1/4 (SUCTIONS) ×1 IMPLANT
YANKAUER SUCT BULB TIP NO VENT (SUCTIONS) ×5 IMPLANT

## 2017-03-25 NOTE — H&P (Signed)
PREOPERATIVE H&P  Chief Complaint: BILATERAL hip degenerative joint disease  HPI: Patricia Davila is a 46 y.o. female who presents for surgical treatment of BILATERAL hip degenerative joint disease.  She denies any changes in medical history.  Past Medical History:  Diagnosis Date  . Anemia    low iron during pregnancy  . Anxiety   . Arthritis   . Asthma    as a child  . Bipolar disorder ([place])   . Depression   . GERD (gastroesophageal reflux disease)   . Headache   . Hypertension   . Pneumonia    as a child  . PONV (postoperative nausea and vomiting)   . Restless legs    Past Surgical History:  Procedure Laterality Date  . DENTAL SURGERY     all teeth extracted  . LAMINECTOMY AND MICRODISCECTOMY LUMBAR SPINE   06/27/2010  . TUBAL LIGATION Bilateral    Social History   Social History  . Marital status: Married    Spouse name: N/A  . Number of children: N/A  . Years of education: N/A   Social History Main Topics  . Smoking status: Current Every Day Smoker    Packs/day: 0.25    Types: Cigarettes  . Smokeless tobacco: Never Used     Comment: Using Nicotine patch  . Alcohol use No  . Drug use: No  . Sexual activity: Yes    Birth control/ protection: None   Other Topics Concern  . Not on file   Social History Narrative  . No narrative on file   Family History  Problem Relation Age of Onset  . Colon cancer Mother   . HIV Mother   . Colon cancer Sister    Allergies  Allergen Reactions  . Ativan [Lorazepam] Anxiety    HALLUCINATIONS  . Tylenol With Codeine #3 [Acetaminophen-Codeine] Hives and Itching   Prior to Admission medications   Medication Sig Start Date End Date Taking? Authorizing Provider  hydrochlorothiazide (HYDRODIURIL) 25 MG tablet Take 1 tablet (25 mg total) by mouth daily. Take on tablet in the morning. 02/05/17  Yes Clent Demark, PA-C  losartan (COZAAR) 50 MG tablet Take 1 tablet (50 mg total) by mouth daily. 01/19/17  Yes Clent Demark, PA-C  naproxen sodium (ANAPROX) 220 MG tablet Take 440 mg by mouth 3 (three) times daily as needed (pain).   Yes [provider]  omeprazole (PRILOSEC) 40 MG capsule Take 1 capsule (40 mg total) by mouth daily. 02/05/17  Yes Clent Demark, PA-C  SUMAtriptan (IMITREX) 25 MG tablet Take 1 tablet (25 mg total) by mouth daily. May take one more tablet two hours after the first. No more than two tablets per day. Patient taking differently: Take 25 mg by mouth every 2 (two) hours as needed. May take one more tablet two hours after the first. No more than two tablets per day. 01/19/17  Yes Clent Demark, PA-C  traMADol (ULTRAM) 50 MG tablet Take 1 tablet (50 mg total) by mouth every 6 (six) hours as needed. Patient taking differently: Take 50 mg by mouth every 6 (six) hours as needed for moderate pain.  02/05/17  Yes Clent Demark, PA-C  escitalopram (LEXAPRO) 5 MG tablet Take 1 tablet (5 mg total) by mouth daily. Patient not taking: Reported on 03/19/2017 02/05/17   Clent Demark, PA-C  nicotine (NICODERM CQ - DOSED IN MG/24 HOURS) 21 mg/24hr patch Place 21 mg onto the skin daily.  [provider]     Positive ROS: All other systems have been reviewed and were otherwise negative with the exception of those mentioned in the HPI and as above.  Physical Exam: General: Alert, no acute distress Cardiovascular: No pedal edema Respiratory: No cyanosis, no use of accessory musculature GI: abdomen soft Skin: No lesions in the area of chief complaint Neurologic: Sensation intact distally Psychiatric: Patient is competent for consent with normal mood and affect Lymphatic: no lymphedema  MUSCULOSKELETAL: exam stable  Assessment: BILATERAL hip degenerative joint disease  Plan: Plan for Procedure(s): BILATERAL ANTERIOR TOTAL HIP ARTHROPLASTY  The risks benefits and alternatives were discussed with the patient including but not limited to the risks of  nonoperative treatment, versus surgical intervention including infection, bleeding, nerve injury,  blood clots, cardiopulmonary complications, morbidity, mortality, among others, and they were willing to proceed.   Eduard Roux, MD   03/25/2017 10:08 AM

## 2017-03-25 NOTE — Anesthesia Preprocedure Evaluation (Addendum)
Anesthesia Evaluation  Patient identified by MRN, date of birth, ID band Patient awake    Reviewed: Allergy & Precautions, H&P , Patient's Chart, lab work & pertinent test results  Airway Mallampati: II  TM Distance: >3 FB Neck ROM: full    Dental no notable dental hx.    Pulmonary Current Smoker,    Pulmonary exam normal breath sounds clear to auscultation       Cardiovascular Exercise Tolerance: Good hypertension,  Rhythm:regular Rate:Normal     Neuro/Psych    GI/Hepatic   Endo/Other    Renal/GU      Musculoskeletal   Abdominal   Peds  Hematology   Anesthesia Other Findings   Reproductive/Obstetrics                             Anesthesia Physical Anesthesia Plan  ASA: II  Anesthesia Plan: Spinal, Combined Spinal and Epidural and Epidural   Post-op Pain Management:    Induction:   PONV Risk Score and Plan: 2 and Ondansetron and Dexamethasone  Airway Management Planned:   Additional Equipment:   Intra-op Plan:   Post-operative Plan:   Informed Consent: I have reviewed the patients History and Physical, chart, labs and discussed the procedure including the risks, benefits and alternatives for the proposed anesthesia with the patient or authorized representative who has indicated his/her understanding and acceptance.     Plan Discussed with:   Anesthesia Plan Comments: (  )       Anesthesia Quick Evaluation

## 2017-03-25 NOTE — Op Note (Signed)
BILATERAL ANTERIOR TOTAL HIP ARTHROPLASTY  Procedure Note Patricia Davila   [id number]  Pre-op Diagnosis: BILATERAL hip degenerative joint disease     Post-op Diagnosis: same   Operative Procedures  1. Total hip replacement; Bilateral hip; uncemented [id number]   Personnel  Surgeon(s): Leandrew Koyanagi, MD   Assistant: Benita Stabile, PA necessary for the timely completion of the procedures   Anesthesia: epidural  Prosthesis: Depuy Acetabulum: Pinnacle 50 mm for both sides Femur: Corail KA 9 on the left; Corail KA 10 on the right Head: 32 mm size: +1 for both Liner: neutral for both Bearing Type: ceramic on poly for both  Total Hip Arthroplasty (Anterior Approach) Op Note:  After informed consent was obtained and the operative extremity marked in the holding area, the patient was brought back to the operating room and placed supine on the HANA table. Next, the operative extremity was prepped and draped in normal sterile fashion. Surgical timeout occurred verifying patient identification, surgical site, surgical procedure and administration of antibiotics.   We first began with the left hip. A modified anterior Smith-Peterson approach to the hip was performed, using the interval between tensor fascia lata and sartorius.  Dissection was carried bluntly down onto the anterior hip capsule. The lateral femoral circumflex vessels were identified and coagulated. A capsulotomy was performed and the capsular flaps tagged for later repair.  Fluoroscopy was utilized to prepare for the femoral neck cut. The neck osteotomy was performed. The femoral head was removed, the acetabular rim was cleared of soft tissue and attention was turned to reaming the acetabulum.  Sequential reaming was performed under fluoroscopic guidance. We reamed to a size 49 mm, and then impacted the acetabular shell. The liner was then placed after irrigation and attention turned to the femur.  After placing the femoral hook, the leg  was taken to externally rotated, extended and adducted position taking care to perform soft tissue releases to allow for adequate mobilization of the femur. Soft tissue was cleared from the shoulder of the greater trochanter and the hook elevator used to improve exposure of the proximal femur. Sequential broaching performed up to a size 9. Trial neck and head were placed. The leg was brought back up to neutral and the construct reduced. The position and sizing of components, offset and leg lengths were checked using fluoroscopy. Stability of the construct was checked in extension and external rotation without any subluxation or impingement of prosthesis. We dislocated the prosthesis, dropped the leg back into position, removed trial components, and irrigated copiously. The final stem and head was then placed, the leg brought back up, the system reduced and fluoroscopy used to verify positioning.  We then reprepped and draped the right hip for the procedure.   A modified anterior Smith-Peterson approach to the hip was performed, using the interval between tensor fascia lata and sartorius.  Dissection was carried bluntly down onto the anterior hip capsule. The lateral femoral circumflex vessels were identified and coagulated. A capsulotomy was performed and the capsular flaps tagged for later repair.  Fluoroscopy was utilized to prepare for the femoral neck cut. The neck osteotomy was performed. The femoral head was removed, the acetabular rim was cleared of soft tissue and attention was turned to reaming the acetabulum.  Sequential reaming was performed under fluoroscopic guidance. We reamed to a size 49 mm, and then impacted the acetabular shell. The liner was then placed after irrigation and attention turned to the femur.  After placing the femoral  hook, the leg was taken to externally rotated, extended and adducted position taking care to perform soft tissue releases to allow for adequate mobilization of the  femur. Soft tissue was cleared from the shoulder of the greater trochanter and the hook elevator used to improve exposure of the proximal femur. Sequential broaching performed up to a size 10. Trial neck and head were placed. The leg was brought back up to neutral and the construct reduced. The position and sizing of components, offset and leg lengths were checked using fluoroscopy. Stability of the construct was checked in extension and external rotation without any subluxation or impingement of prosthesis. We dislocated the prosthesis, dropped the leg back into position, removed trial components, and irrigated copiously. The final stem and head was then placed, the leg brought back up, the system reduced and fluoroscopy used to verify positioning.  We irrigated, obtained hemostasis and closed the capsule using #2 ethibond suture.  Dilute betadyne solution was used. The fascia was closed with #1 vicryl plus, the deep fat layer was closed with 0 vicryl, the subcutaneous layers closed with 2.0 Vicryl Plus and the skin closed with 3.0 monocryl and steri strips. A sterile dressing was applied. The patient was awakened in the operating room and taken to recovery in stable condition.  All sponge, needle, and instrument counts were correct at the end of the case.   Position: supine  Complications: none.  Time Out: performed   Drains/Packing: none  Estimated blood loss: 450 cc total   Returned to Recovery Room: in good condition.   Antibiotics: yes   Mechanical VTE (DVT) Prophylaxis: sequential compression devices, TED thigh-high  Chemical VTE (DVT) Prophylaxis: xarelto   Fluid Replacement: see anesthesia record  Specimens Removed: 1 to pathology   Sponge and Instrument Count Correct? yes   PACU: portable radiograph - low AP   Admission: inpatient status  Plan/RTC: Return in 2 weeks for staple removal. Weight Bearing/Load Lower Extremity: full  Hip precautions: none Suture Removal: 10-14  days  Betadine to incision twice daily once dressing is removed on POD#7  N. Eduard Roux, MD [place] [phone] 4:03 PM      Implant Name Type Inv. Item Serial No. Manufacturer Lot No. LRB No. Used  CUP SECTOR GRIPTON 50MM - [id number] Cup CUP SECTOR GRIPTON 50MM  DEPUY SYNTHES [id number] Left 1  SCREW 6.5MMX25MM - [id number] Screw SCREW 6.5MMX25MM  DEPUY SYNTHES [id number] Left 1  LINER ACETABULAR 32X50 - [id number] Liner LINER ACETABULAR 32X50  DEPUY SYNTHES [id number] Left 1  SROM M HEAD 32MM PLUS 1 - [id number] Hips SROM M HEAD 32MM PLUS 1  DEPUY SYNTHES [id number] Left 1  STEM CORAIL KLA09 - [id number] Stem STEM CORAIL KLA09  DEPUY SYNTHES [id number] Left 1  CUP SECTOR GRIPTON 50MM - [id number] Cup CUP SECTOR GRIPTON 50MM  DEPUY SYNTHES [id number] Right 1  SCREW 6.5MMX25MM - [id number] Screw SCREW 6.5MMX25MM  DEPUY SYNTHES [id number] Right 1  LINER ACETABULAR 32X50 - [id number] Liner LINER ACETABULAR 32X50  DEPUY SYNTHES [id number] Right 1  STEM CORAIL KLA10 - [id number] Stem STEM CORAIL KLA10  DEPUY SYNTHES [id number] Right 1  SROM M HEAD 32MM PLUS 1 - [id number] Hips SROM M HEAD 32MM PLUS 1   DEPUY SYNTHES [id number] Right 1

## 2017-03-25 NOTE — Progress Notes (Signed)
Orthopedic Tech Progress Note Patient Details:  Patricia Davila [birth year] [id number]  Ortho Devices Ortho Device/Splint Location: applied ohf to bed Ortho Device/Splint Interventions: Ordered, Application   Braulio Bosch 03/25/2017, 9:26 PM

## 2017-03-25 NOTE — Transfer of Care (Signed)
Immediate Anesthesia Transfer of Care Note  Patient: Patricia Davila  Procedure(s) Performed: Procedure(s): BILATERAL ANTERIOR TOTAL HIP ARTHROPLASTY (Bilateral)  Patient Location: PACU  Anesthesia Type:Spinal, Epidural and MAC combined with regional for post-op pain  Level of Consciousness: awake, alert , patient cooperative and confused  Airway & Oxygen Therapy: Patient Spontanous Breathing and Patient connected to nasal cannula oxygen  Post-op Assessment: Report given to RN, Post -op Vital signs reviewed and stable and Patient moving all extremities  Post vital signs: Reviewed and stable  Last Vitals:  Vitals:   03/25/17 1020  BP: (!) 163/102  Pulse: 70  Resp: 20  Temp: 36.8 C  SpO2: 100%    Last Pain:  Vitals:   03/25/17 1035  TempSrc:   PainSc: 8       Patients Stated Pain Goal: 5 (30/16/01 0932)  Complications: No apparent anesthesia complications

## 2017-03-25 NOTE — Anesthesia Postprocedure Evaluation (Signed)
Anesthesia Post Note  Patient: Patricia Davila  Procedure(s) Performed: Procedure(s) (LRB): BILATERAL ANTERIOR TOTAL HIP ARTHROPLASTY (Bilateral)     Patient location during evaluation: PACU Anesthesia Type: Spinal Level of consciousness: awake and alert Pain management: pain level controlled Vital Signs Assessment: post-procedure vital signs reviewed and stable Respiratory status: spontaneous breathing and respiratory function stable Cardiovascular status: blood pressure returned to baseline and stable Postop Assessment: spinal receding and epidural receding Anesthetic complications: no Comments: Epidural catheter removed in PACU, tip intact    Last Vitals:  Vitals:   03/25/17 1720 03/25/17 1735  BP: 100/78 105/72  Pulse: 61 63  Resp: 14 19  Temp:    SpO2: 100% 100%    Last Pain:  Vitals:   03/25/17 1705  TempSrc:   PainSc: 0-No pain    LLE Motor Response: No movement due to regional block (03/25/17 1735) LLE Sensation: Decreased;Numbness (03/25/17 1735) RLE Motor Response: No movement due to regional block (03/25/17 1735) RLE Sensation: Decreased;Numbness (03/25/17 1735) L Sensory Level: T10-Umbilical region (65/78/46 1735) R Sensory Level: T10-Umbilical region (96/29/52 1735)  Nolon Nations

## 2017-03-25 NOTE — Anesthesia Procedure Notes (Addendum)
Epidural Patient location during procedure: OR Start time: 03/25/2017 12:30 PM End time: 03/25/2017 12:37 PM  Staffing Anesthesiologist: Suzette Battiest Performed: anesthesiologist   Preanesthetic Checklist Completed: patient identified, site marked, surgical consent, pre-op evaluation, timeout performed, IV checked, risks and benefits discussed and monitors and equipment checked  Epidural Patient position: sitting Prep: site prepped and draped and DuraPrep Patient monitoring: continuous pulse ox and blood pressure Approach: midline Location: L3-L4 Injection technique: LOR air  Needle:  Needle type: Tuohy  Needle gauge: 17 G Needle length: 9 cm and 9 Needle insertion depth: 6 cm Catheter type: closed end flexible Catheter size: 19 Gauge Catheter at skin depth: 11 cm Test dose: negative  Assessment Events: blood not aspirated, injection not painful, no injection resistance, negative IV test and no paresthesia  Additional Notes Combined spinal and epidural performed 2/2 case duration. LOR to air at 6cm. Easy pass of 24g Pencan through touhy needle with free flowing CSF before and after injection LA. Easy pass of catheter to 11 cm at the skin. Taped and secured.

## 2017-03-25 NOTE — Anesthesia Procedure Notes (Signed)
Procedure Name: MAC Date/Time: 03/25/2017 1:30 PM Performed by: Orlie Dakin Oxygen Delivery Method: Simple face mask Ventilation: Nasal airway inserted- appropriate to patient size

## 2017-03-25 NOTE — Anesthesia Procedure Notes (Signed)
Procedure Name: MAC Date/Time: 03/25/2017 12:41 PM Performed by: Orlie Dakin Pre-anesthesia Checklist: Patient identified, Emergency Drugs available, Suction available, Patient being monitored and Timeout performed Patient Re-evaluated:Patient Re-evaluated prior to induction Oxygen Delivery Method: Simple face mask

## 2017-03-25 NOTE — Progress Notes (Signed)
Spoke with dr foster/mda as pt not c/o pain but tearful/flailing/ asking for ativan from home/ preoccupied with inability to move legs, roll onto side  / orders rec'd

## 2017-03-26 ENCOUNTER — Encounter (HOSPITAL_COMMUNITY): Payer: Self-pay | Admitting: Orthopaedic Surgery

## 2017-03-26 LAB — CBC
HCT: 27 % — ABNORMAL LOW (ref 36.0–46.0)
HEMOGLOBIN: 9.1 g/dL — AB (ref 12.0–15.0)
MCH: 29.4 pg (ref 26.0–34.0)
MCHC: 33.7 g/dL (ref 30.0–36.0)
MCV: 87.4 fL (ref 78.0–100.0)
Platelets: 238 10*3/uL (ref 150–400)
RBC: 3.09 MIL/uL — ABNORMAL LOW (ref 3.87–5.11)
RDW: 14.6 % (ref 11.5–15.5)
WBC: 11.1 10*3/uL — ABNORMAL HIGH (ref 4.0–10.5)

## 2017-03-26 LAB — BASIC METABOLIC PANEL
ANION GAP: 6 (ref 5–15)
BUN: 8 mg/dL (ref 6–20)
CHLORIDE: 104 mmol/L (ref 101–111)
CO2: 25 mmol/L (ref 22–32)
Calcium: 8.2 mg/dL — ABNORMAL LOW (ref 8.9–10.3)
Creatinine, Ser: 0.71 mg/dL (ref 0.44–1.00)
GFR calc non Af Amer: >60 mL/min (ref >60)
Glucose, Bld: 133 mg/dL — ABNORMAL HIGH (ref 65–99)
POTASSIUM: 3.3 mmol/L — AB (ref 3.5–5.1)
SODIUM: 135 mmol/L (ref 135–145)

## 2017-03-26 NOTE — Plan of Care (Signed)
Problem: Safety: Goal: Ability to remain free from injury will improve Outcome: Progressing Safety precautions maintained  Problem: Pain Managment: Goal: General experience of comfort will improve Outcome: Not Progressing Pain not relieved accordingly  Problem: Skin Integrity: Goal: Risk for impaired skin integrity will decrease Outcome: Progressing Aquacel dry and intact to both hips, no drainage, redness or swellin noted  Problem: Tissue Perfusion: Goal: Risk factors for ineffective tissue perfusion will decrease Outcome: Progressing SCDs are on  Problem: Activity: Goal: Risk for activity intolerance will decrease Outcome: Progressing remians in the bed  Problem: Nutrition: Goal: Adequate nutrition will be maintained Outcome: Progressing Ate 100 % frozen diner  Problem: Bowel/Gastric: Goal: Will not experience complications related to bowel motility Outcome: Progressing No bowel issues reported

## 2017-03-26 NOTE — Evaluation (Signed)
Physical Therapy Evaluation Patient Details Name: Patricia Davila MRN: [id number] DOB: [birth year] Today's Date: 03/26/2017   History of Present Illness  Pt is a 46 yo female admitted on 03/25/17 for elective bilateral THA. PMH significant for anemia, HTN, anxiety, OA, asthma, bipolar, depression, GERD.   Clinical Impression  Pt is POD 1 following the above procedure. Prior to admission, pt lived with her husband and children in a single level home with a level entry. Pt was completely independent. Pt requires Min A for a majority of mobility this session due to pain in bilateral hips. Pt will benefit from continued acute PT follow-up in order to address the below deficits prior to discharge to venue recommended below.     Follow Up Recommendations Home health PT    Equipment Recommendations  Rolling walker with 5" wheels;3in1 (PT);Other (comment) (transfer bench?)    Recommendations for Other Services       Precautions / Restrictions Precautions Precautions: None Precaution Comments: Bilateral THA Direct Anterior Restrictions Weight Bearing Restrictions: Yes RLE Weight Bearing: Weight bearing as tolerated LLE Weight Bearing: Weight bearing as tolerated      Mobility  Bed Mobility Overal bed mobility: Needs Assistance Bed Mobility: Supine to Sit     Supine to sit: Min assist     General bed mobility comments: Min A to bring LEs EOB and to scoot hips to EOB  Transfers Overall transfer level: Needs assistance Equipment used: Rolling walker (2 wheeled) Transfers: Sit to/from [facility] Sit to Stand: Min assist;Min guard Stand pivot transfers: Min assist       General transfer comment: Min A to boost up to stand from EOB and Min guard from [facility] . MIn A for pivot transfer to recliner  Ambulation/Gait             General Gait Details: deferred this session  Stairs            Wheelchair Mobility    Modified Rankin (Stroke Patients Only)        Balance Overall balance assessment: Needs assistance Sitting-balance support: No upper extremity supported;Feet supported Sitting balance-Leahy Scale: Normal     Standing balance support: Bilateral upper extremity supported;Single extremity supported Standing balance-Leahy Scale: Fair Standing balance comment: able to stand briefly without UE support                             Pertinent Vitals/Pain Pain Assessment: 0-10 Pain Score: 4  Pain Location: bilateral hips Pain Descriptors / Indicators: Throbbing;Sore Pain Intervention(s): Monitored during session;Premedicated before session;Repositioned;Ice applied    Home Living Family/patient expects to be discharged to:: Private residence Living Arrangements: Spouse/significant other;Children;Other relatives (children and two grandkids) Available Help at Discharge: Family;Available 24 hours/day Type of Home: House Home Access: Level entry     Home Layout: One level Home Equipment: Cane - single point      Prior Function Level of Independence: Independent;Independent with assistive device(s)         Comments: indepenent with occasional cane use. Was completely independent and driving. doing everything for herself.      Hand Dominance   Dominant Hand: Right    Extremity/Trunk Assessment   Upper Extremity Assessment Upper Extremity Assessment: Overall WFL for tasks assessed    Lower Extremity Assessment Lower Extremity Assessment: LLE deficits/detail;RLE deficits/detail RLE Deficits / Details: pt with post op pain and weakness. At least 3/5 grossly RLE LLE Deficits / Details: Pt with  post op pain and weakness. At least 3/5 ankle and knee and 2/5 hip per gross functional assessment    Cervical / Trunk Assessment Cervical / Trunk Assessment: Normal  Communication   Communication: No difficulties  Cognition Arousal/Alertness: Awake/alert Behavior During Therapy: WFL for tasks  assessed/performed Overall Cognitive Status: Within Functional Limits for tasks assessed                                        General Comments      Exercises Total Joint Exercises Ankle Circles/Pumps: AROM;Both;20 reps;Supine Quad Sets: AROM;Both;10 reps;Supine Heel Slides: AROM;Right;AAROM;Left;10 reps;Supine   Assessment/Plan    PT Assessment Patient needs continued PT services  PT Problem List Decreased strength;Decreased range of motion;Decreased activity tolerance;Decreased balance;Decreased mobility;Decreased knowledge of use of DME;Pain       PT Treatment Interventions DME instruction;Gait training;Stair training;Therapeutic exercise;Therapeutic activities;Functional mobility training;Balance training;Patient/family education    PT Goals (Current goals can be found in the Care Plan section)  Acute Rehab PT Goals Patient Stated Goal: to have less pain PT Goal Formulation: With patient Time For Goal Achievement: 04/02/17 Potential to Achieve Goals: Good    Frequency 7X/week   Barriers to discharge        Co-evaluation               AM-PAC PT "6 Clicks" Daily Activity  Outcome Measure Difficulty turning over in bed (including adjusting bedclothes, sheets and blankets)?: Total Difficulty moving from lying on back to sitting on the side of the bed? : Total Difficulty sitting down on and standing up from a chair with arms (e.g., wheelchair, bedside commode, etc,.)?: Total Help needed moving to and from a bed to chair (including a wheelchair)?: A Little Help needed walking in hospital room?: A Little Help needed climbing 3-5 steps with a railing? : A Lot 6 Click Score: 11    End of Session Equipment Utilized During Treatment: Gait belt Activity Tolerance: Patient tolerated treatment well Patient left: in chair;with call bell/phone within reach Nurse Communication: Mobility status PT Visit Diagnosis: Other abnormalities of gait and mobility  (R26.89);Pain;Muscle weakness (generalized) (M62.81) Pain - Right/Left: Right (bilateral) Pain - part of body: Hip    Time: 5631-4970 PT Time Calculation (min) (ACUTE ONLY): 35 min   Charges:   PT Evaluation $PT Eval Moderate Complexity: 1 Mod PT Treatments $Therapeutic Activity: 8-22 mins   PT G Codes:        Scheryl Marten PT, DPT  [phone]   Jacqulyn Liner Sloan Leiter 03/26/2017, 9:57 AM

## 2017-03-26 NOTE — Progress Notes (Signed)
Physical Therapy Treatment Patient Details Name: Patricia Davila MRN: [id number] DOB: 02/15/1971 Today's Date: 03/26/2017    History of Present Illness Pt is a 46 yo female admitted on 03/25/17 for elective bilateral THA. PMH significant for anemia, HTN, anxiety, OA, asthma, bipolar, depression, GERD.     PT Comments    Pt demonstrates improved tolerance for bed mobility and gait this session. Pt is able to perform 250' this session and improved with bed mobility not requiring any assistance to get into and out of bed this session. Pt continues to benefit from PT follow-up in order to progress strength and gait prior to D/C.     Follow Up Recommendations  DC plan and follow up therapy as arranged by surgeon     Equipment Recommendations  Rolling walker with 5" wheels;3in1 (PT);Other (comment) (transfer bench)    Recommendations for Other Services       Precautions / Restrictions Precautions Precautions: None Precaution Comments: Bilateral THA Direct Anterior Restrictions Weight Bearing Restrictions: Yes RLE Weight Bearing: Weight bearing as tolerated LLE Weight Bearing: Weight bearing as tolerated    Mobility  Bed Mobility Overal bed mobility: Needs Assistance Bed Mobility: Supine to Sit;Sit to Supine     Supine to sit: Min guard Sit to supine: Min guard   General bed mobility comments: MIn guard this session to bring LE's into and out of bed. Used rail, no hands on assistance required  Transfers Overall transfer level: Needs assistance Equipment used: Rolling walker (2 wheeled) Transfers: Sit to/from Stand Sit to Stand: Min guard         General transfer comment: MIn guard for safety. Otherwise progressing to supervision  Ambulation/Gait Ambulation/Gait assistance: Min guard;Supervision Ambulation Distance (Feet): 250 Feet Assistive device: Rolling walker (2 wheeled) Gait Pattern/deviations: Step-through pattern;Decreased stride length;Antalgic;Narrow base of  support Gait velocity: decreased Gait velocity interpretation: Below normal speed for age/gender General Gait Details: decreased step length bilaterally, cues for heel strike bilaterally. MIld antalgic gait noted   Stairs            Wheelchair Mobility    Modified Rankin (Stroke Patients Only)       Balance Overall balance assessment: Needs assistance Sitting-balance support: No upper extremity supported;Feet supported Sitting balance-Leahy Scale: Normal     Standing balance support: No upper extremity supported Standing balance-Leahy Scale: Fair Standing balance comment: able to stand briefly without UE support                            Cognition Arousal/Alertness: Awake/alert Behavior During Therapy: WFL for tasks assessed/performed Overall Cognitive Status: Within Functional Limits for tasks assessed                                        Exercises Total Joint Exercises Ankle Circles/Pumps: AROM;Both;20 reps;Supine Hip ABduction/ADduction: Both;10 reps;Supine;AAROM    General Comments        Pertinent Vitals/Pain Pain Assessment: 0-10 Pain Score: 5  Pain Location: bilateral hips Pain Descriptors / Indicators: Throbbing;Sore Pain Intervention(s): Monitored during session;Premedicated before session;Repositioned;Ice applied    Home Living                      Prior Function            PT Goals (current goals can now be found in the care plan section)  Acute Rehab PT Goals Patient Stated Goal: to have less pain Progress towards PT goals: Progressing toward goals    Frequency    7X/week      PT Plan Current plan remains appropriate    Co-evaluation              AM-PAC PT "6 Clicks" Daily Activity  Outcome Measure  Difficulty turning over in bed (including adjusting bedclothes, sheets and blankets)?: A Little Difficulty moving from lying on back to sitting on the side of the bed? : A  Little Difficulty sitting down on and standing up from a chair with arms (e.g., wheelchair, bedside commode, etc,.)?: A Little Help needed moving to and from a bed to chair (including a wheelchair)?: A Little Help needed walking in hospital room?: A Little Help needed climbing 3-5 steps with a railing? : A Lot 6 Click Score: 17    End of Session Equipment Utilized During Treatment: Gait belt Activity Tolerance: Patient tolerated treatment well Patient left: in bed;with call bell/phone within reach;with SCD's reapplied Nurse Communication: Mobility status PT Visit Diagnosis: Other abnormalities of gait and mobility (R26.89);Pain;Muscle weakness (generalized) (M62.81) Pain - Right/Left: Right (both) Pain - part of body: Hip     Time: 4356-8616 PT Time Calculation (min) (ACUTE ONLY): 21 min  Charges:  $Gait Training: 8-22 mins                    G Codes:       Scheryl Marten PT, DPT  [phone]    Jacqulyn Liner Sloan Leiter 03/26/2017, 3:46 PM

## 2017-03-26 NOTE — Progress Notes (Signed)
   Subjective:  Patient reports pain as mild.  No events  Objective:   VITALS:   Vitals:   03/25/17 1935 03/25/17 2000 03/25/17 2020 03/26/17 0450  BP: (!) 147/82 (!) 151/89 (!) 160/85 (!) 143/83  Pulse: 65 69 76 67  Resp: 20 19 18 18   Temp:  98 F (36.7 C) 97.7 F (36.5 C) 98.5 F (36.9 C)  TempSrc:    Oral  SpO2: 100% 100% 100% 99%  Weight:   179 lb 0.2 oz (81.2 kg)   Height:   5\' 1"  (1.549 m)     Neurologically intact Neurovascular intact Sensation intact distally Intact pulses distally Dorsiflexion/Plantar flexion intact Incision: dressing C/D/I and no drainage No cellulitis present Compartment soft   Lab Results  Component Value Date   WBC 11.1 (H) 03/26/2017   HGB 9.1 (L) 03/26/2017   HCT 27.0 (L) 03/26/2017   MCV 87.4 03/26/2017   PLT 238 03/26/2017     Assessment/Plan:  1 Day Post-Op   - Expected postop acute blood loss anemia - will monitor for symptoms - Up with PT/OT - DVT ppx - SCDs, ambulation, xarelto - WBAT operative extremity - Pain control -This seems to be well controlled. - Discharge planning - anticipate discharge on Monday  Eduard Roux 03/26/2017, 7:55 AM [phone]

## 2017-03-27 NOTE — Care Management Note (Signed)
Case Management Note  Patient Details  Name: Patricia Davila MRN: [id number] Date of Birth: February 12, 1971  Subjective/Objective:    46 yr old female s/p bilateral hip arthroplasty.                Action/Plan: Case manager spoke with patient and her husband concernign discharge plan and DME needs.    Expected Discharge Date:   03/28/17               Expected Discharge Plan:  [place]  In-House Referral:     Discharge planning Services  CM Consult  Post Acute Care Choice:  Durable Medical Equipment, Home Health Choice offered to:  Patient, Spouse  DME Arranged:  3-N-1, Walker rolling DME Agency:  [place]:  PT [place]:  [facility]  Status of Service:  Completed, signed off  If discussed at [employer], dates discussed:    Additional Comments:  Ninfa Meeker, RN 03/27/2017, 10:44 AM

## 2017-03-27 NOTE — Progress Notes (Signed)
Physical Therapy Treatment Patient Details Name: Patricia Davila MRN: [id number] DOB: [birth year] Today's Date: 03/27/2017    History of Present Illness Pt is a 46 yo female admitted on 03/25/17 for elective bilateral THA. PMH significant for anemia, HTN, anxiety, OA, asthma, bipolar, depression, GERD.     PT Comments    Pt is POD 2 and continues to be moving well with therapy. Despite poor pain management last night and lack of sleep, pt is able to perform LE exercises and increase gait distance this session with No increased c/o pain. Pt reports pain at 4/10 at the completion of therapy. Pt is making excellent progress toward goals.     Follow Up Recommendations  DC plan and follow up therapy as arranged by surgeon     Equipment Recommendations  Rolling walker with 5" wheels;3in1 (PT)    Recommendations for Other Services       Precautions / Restrictions Precautions Precautions: None Precaution Comments: Bilateral THA Direct Anterior Restrictions Weight Bearing Restrictions: Yes RLE Weight Bearing: Weight bearing as tolerated LLE Weight Bearing: Weight bearing as tolerated    Mobility  Bed Mobility Overal bed mobility: Needs Assistance Bed Mobility: Supine to Sit     Supine to sit: Supervision     General bed mobility comments: supervision for safety. Performs majority of movement without any cues or assistance  Transfers Overall transfer level: Needs assistance Equipment used: Rolling walker (2 wheeled) Transfers: Sit to/from Stand Sit to Stand: Supervision         General transfer comment: supervision for safety. Good hand placement  Ambulation/Gait Ambulation/Gait assistance: Supervision Ambulation Distance (Feet): 350 Feet Assistive device: Rolling walker (2 wheeled) Gait Pattern/deviations: Step-through pattern;Decreased stride length;Antalgic;Narrow base of support Gait velocity: decreased Gait velocity interpretation: Below normal speed for  age/gender General Gait Details: decreased step length bilaterally, cues for heel strike bilaterally. MIld antalgic gait noted   Stairs            Wheelchair Mobility    Modified Rankin (Stroke Patients Only)       Balance Overall balance assessment: Needs assistance Sitting-balance support: No upper extremity supported;Feet supported Sitting balance-Leahy Scale: Normal     Standing balance support: No upper extremity supported Standing balance-Leahy Scale: Fair Standing balance comment: able to stand briefly without UE support                            Cognition Arousal/Alertness: Awake/alert Behavior During Therapy: WFL for tasks assessed/performed Overall Cognitive Status: Within Functional Limits for tasks assessed                                        Exercises Total Joint Exercises Ankle Circles/Pumps: AROM;Both;20 reps;Supine Quad Sets: AROM;Both;10 reps;Supine Heel Slides: AROM;Both;10 reps;Supine Hip ABduction/ADduction: AAROM;Both;10 reps;Supine Long Arc Quad: AROM;Both;10 reps;Seated    General Comments        Pertinent Vitals/Pain Pain Assessment: 0-10 Pain Score: 4  Pain Location: bilateral hips Pain Descriptors / Indicators: Throbbing;Sore Pain Intervention(s): Monitored during session;Premedicated before session;Repositioned;Ice applied    Home Living                      Prior Function            PT Goals (current goals can now be found in the care plan section) Acute Rehab PT Goals  Patient Stated Goal: to go home Progress towards PT goals: Progressing toward goals    Frequency    7X/week      PT Plan Current plan remains appropriate    Co-evaluation              AM-PAC PT "6 Clicks" Daily Activity  Outcome Measure  Difficulty turning over in bed (including adjusting bedclothes, sheets and blankets)?: A Little Difficulty moving from lying on back to sitting on the side of the  bed? : A Little Difficulty sitting down on and standing up from a chair with arms (e.g., wheelchair, bedside commode, etc,.)?: A Little Help needed moving to and from a bed to chair (including a wheelchair)?: A Little Help needed walking in hospital room?: A Little Help needed climbing 3-5 steps with a railing? : A Little 6 Click Score: 18    End of Session Equipment Utilized During Treatment: Gait belt Activity Tolerance: Patient tolerated treatment well Patient left: in chair;with call bell/phone within reach;with family/visitor present Nurse Communication: Mobility status PT Visit Diagnosis: Other abnormalities of gait and mobility (R26.89);Pain;Muscle weakness (generalized) (M62.81) Pain - Right/Left: Left (bilateral) Pain - part of body: Hip     Time: 0349-1791 PT Time Calculation (min) (ACUTE ONLY): 27 min  Charges:  $Gait Training: 8-22 mins $Therapeutic Exercise: 8-22 mins                    G Codes:       Scheryl Marten PT, DPT  [phone]    Shanon Rosser 03/27/2017, 10:21 AM

## 2017-03-27 NOTE — Care Management Note (Signed)
Case Management Note  Patient Details  Name: Patricia Davila MRN: [id number] Date of Birth: Aug 08, 1971  Subjective/Objective:  46 yr old female s/p bilateral hip arthroplasty.                Action/Plan: Case manager spoke with patient and her husband concerning discharge plan and DME needs. Choice for Home Health was offered. Referral was called to Sherian Rein, [facility] CM has ordered DME. Patient will have family support at discharge.    Expected Discharge Date:     03/28/17             Expected Discharge Plan:  [place]  In-House Referral:     Discharge planning Services  CM Consult  Post Acute Care Choice:  Durable Medical Equipment, Home Health Choice offered to:  Patient, Spouse  DME Arranged:  3-N-1, Walker rolling DME Agency:  [place]:  PT [place]:  [facility]  Status of Service:  Completed, signed off  If discussed at [employer], dates discussed:    Additional Comments:  Ninfa Meeker, RN 03/27/2017, 10:51 AM

## 2017-03-27 NOTE — Plan of Care (Signed)
Problem: Pain Managment: Goal: General experience of comfort will improve Patient voices understanding of pain scale and calls for pain medication when needed   

## 2017-03-27 NOTE — Progress Notes (Signed)
Subjective: 2 Days Post-Op Procedure(s) (LRB): BILATERAL ANTERIOR TOTAL HIP ARTHROPLASTY (Bilateral) Patient reports pain as mild.  Working well with therapy.  Objective: Vital signs in last 24 hours: Temp:  [98 F (36.7 C)-98.6 F (37 C)] 98 F (36.7 C) (08/10 2032) Pulse Rate:  [64-75] 75 (08/10 2032) Resp:  [18] 18 (08/10 1409) BP: (131-134)/(72-90) 131/90 (08/10 2032) SpO2:  [100 %] 100 % (08/10 2032)  Intake/Output from previous day: 08/10 0701 - 08/11 0700 In: 720 [P.O.:720] Out: -  Intake/Output this shift: No intake/output data recorded.   Recent Labs  03/26/17 0412  HGB 9.1*    Recent Labs  03/26/17 0412  WBC 11.1*  RBC 3.09*  HCT 27.0*  PLT 238    Recent Labs  03/26/17 0412  NA 135  K 3.3*  CL 104  CO2 25  BUN 8  CREATININE 0.71  GLUCOSE 133*  CALCIUM 8.2*   No results for input(s): LABPT, INR in the last 72 hours.  Sensation intact distally Intact pulses distally Dorsiflexion/Plantar flexion intact Incision: scant drainage  Assessment/Plan: 2 Days Post-Op Procedure(s) (LRB): BILATERAL ANTERIOR TOTAL HIP ARTHROPLASTY (Bilateral) Up with therapy Discharge home with home health Monday.  Mcarthur Rossetti 03/27/2017, 8:16 AM

## 2017-03-27 NOTE — Progress Notes (Signed)
Physical Therapy Treatment Patient Details Name: Patricia Davila MRN: [id number] DOB: Jul 15, 1971 Today's Date: 03/27/2017    History of Present Illness Pt is a 46 yo female admitted on 03/25/17 for elective bilateral THA. PMH significant for anemia, HTN, anxiety, OA, asthma, bipolar, depression, GERD.     PT Comments    Despite increased pain this afternoon, pt is able to perform gait training with good cadence and sequencing. Pt does, however, require increased pain to get OOB this session but is able to get into bed without assistance. Pt continues to benefit from acute PT follow-up in order to continue to work toward improved mobility prior to D/C.     Follow Up Recommendations  DC plan and follow up therapy as arranged by surgeon     Equipment Recommendations  Rolling walker with 5" wheels;3in1 (PT)    Recommendations for Other Services       Precautions / Restrictions Precautions Precautions: None Precaution Comments: Bilateral THA Direct Anterior Restrictions Weight Bearing Restrictions: Yes RLE Weight Bearing: Weight bearing as tolerated LLE Weight Bearing: Weight bearing as tolerated    Mobility  Bed Mobility Overal bed mobility: Needs Assistance Bed Mobility: Supine to Sit     Supine to sit: Min assist Sit to supine: Min guard   General bed mobility comments: Min A to bring LE's EOB and to sit upright at EOB this session due to increased stiffness. Min guard for safety to bring LE's into bed  Transfers Overall transfer level: Needs assistance Equipment used: Rolling walker (2 wheeled) Transfers: Sit to/from Stand Sit to Stand: Supervision         General transfer comment: supervision for safety. Good hand placement  Ambulation/Gait Ambulation/Gait assistance: Supervision Ambulation Distance (Feet): 150 Feet Assistive device: Rolling walker (2 wheeled) Gait Pattern/deviations: Step-through pattern;Decreased stride length;Antalgic;Narrow base of  support Gait velocity: decreased Gait velocity interpretation: Below normal speed for age/gender General Gait Details: decreased step length bilaterally, cues for heel strike bilaterally. MIld antalgic gait noted   Stairs            Wheelchair Mobility    Modified Rankin (Stroke Patients Only)       Balance Overall balance assessment: Needs assistance Sitting-balance support: No upper extremity supported;Feet supported Sitting balance-Leahy Scale: Normal     Standing balance support: No upper extremity supported Standing balance-Leahy Scale: Fair Standing balance comment: able to stand briefly without UE support                            Cognition Arousal/Alertness: Awake/alert Behavior During Therapy: WFL for tasks assessed/performed Overall Cognitive Status: Within Functional Limits for tasks assessed                                        Exercises      General Comments        Pertinent Vitals/Pain Pain Assessment: 0-10 Pain Score: 7  Pain Location: bilateral hips, 7/10 after gait 9/10 before gait Pain Descriptors / Indicators: Throbbing;Sore Pain Intervention(s): Monitored during session;Repositioned;Patient requesting pain meds-RN notified;Ice applied    Home Living                      Prior Function            PT Goals (current goals can now be found in the care plan  section) Acute Rehab PT Goals Patient Stated Goal: to go home Progress towards PT goals: Progressing toward goals    Frequency    7X/week      PT Plan Current plan remains appropriate    Co-evaluation              AM-PAC PT "6 Clicks" Daily Activity  Outcome Measure  Difficulty turning over in bed (including adjusting bedclothes, sheets and blankets)?: None Difficulty moving from lying on back to sitting on the side of the bed? : Total Difficulty sitting down on and standing up from a chair with arms (e.g., wheelchair,  bedside commode, etc,.)?: A Little Help needed moving to and from a bed to chair (including a wheelchair)?: A Little Help needed walking in hospital room?: A Little Help needed climbing 3-5 steps with a railing? : A Little 6 Click Score: 17    End of Session Equipment Utilized During Treatment: Gait belt Activity Tolerance: Patient tolerated treatment well Patient left: in chair;with call bell/phone within reach;with family/visitor present Nurse Communication: Mobility status PT Visit Diagnosis: Other abnormalities of gait and mobility (R26.89);Pain;Muscle weakness (generalized) (M62.81) Pain - Right/Left: Left (right) Pain - part of body: Hip     Time: 7357-8978 PT Time Calculation (min) (ACUTE ONLY): 17 min  Charges:  $Gait Training: 8-22 mins                    G Codes:       Scheryl Marten PT, DPT  [phone]    Jacqulyn Liner Sloan Leiter 03/27/2017, 3:08 PM

## 2017-03-28 NOTE — Progress Notes (Signed)
Patient ID: Patricia Davila, female   DOB: 1971-01-08, 46 y.o.   MRN: [id number] [name] well overall.  Vitals stable.  Bilateral hip dressings with scant drainage.  Discharge to home tomorrow.

## 2017-03-28 NOTE — Progress Notes (Signed)
Physical Therapy Treatment Patient Details Name: Patricia Davila MRN: [id number] DOB: May 08, 1971 Today's Date: 03/28/2017    History of Present Illness Pt is a 46 yo female admitted on 03/25/17 for elective bilateral THA. PMH significant for anemia, HTN, anxiety, OA, asthma, bipolar, depression, GERD.     PT Comments    Pt continues to make steady progress with therapy this admission. Pt is progressing well with HEP and has independent recall of all exercises. Initiated standing marching this session. Pt is able to perform bed mobs without the use of the railings. Husband will be available to assist when she returns home. Pt is good to DC after 1 PT session tomorrow assuming MD is ready to release the patient. Will continue to follow while inpatient.     Follow Up Recommendations  DC plan and follow up therapy as arranged by surgeon     Equipment Recommendations  Rolling walker with 5" wheels;3in1 (PT)    Recommendations for Other Services       Precautions / Restrictions Precautions Precautions: None Precaution Comments: Bilateral THA Direct Anterior Restrictions Weight Bearing Restrictions: Yes RLE Weight Bearing: Weight bearing as tolerated LLE Weight Bearing: Weight bearing as tolerated    Mobility  Bed Mobility Overal bed mobility: Needs Assistance Bed Mobility: Supine to Sit;Sit to Supine     Supine to sit: Modified independent (Device/Increase time) Sit to supine: Modified independent (Device/Increase time)   General bed mobility comments: Mod I this session with HOB flat and no use of railings. Just increased time required  Transfers Overall transfer level: Needs assistance Equipment used: Rolling walker (2 wheeled) Transfers: Sit to/from Stand Sit to Stand: Supervision         General transfer comment: supervision for safety. Good hand placement  Ambulation/Gait Ambulation/Gait assistance: Supervision Ambulation Distance (Feet): 500 Feet Assistive device:  Rolling walker (2 wheeled) Gait Pattern/deviations: Step-through pattern;Decreased stride length;Antalgic Gait velocity: decreased Gait velocity interpretation: Below normal speed for age/gender General Gait Details: Mild antalgic gait, cues for maintaining neutral toe positioning on LLE with gait in order to reduce any IR of hip.    Stairs            Wheelchair Mobility    Modified Rankin (Stroke Patients Only)       Balance Overall balance assessment: Needs assistance Sitting-balance support: No upper extremity supported;Feet supported Sitting balance-Leahy Scale: Normal     Standing balance support: No upper extremity supported Standing balance-Leahy Scale: Good Standing balance comment: able to stand briefly without UE support                            Cognition Arousal/Alertness: Awake/alert Behavior During Therapy: WFL for tasks assessed/performed Overall Cognitive Status: Within Functional Limits for tasks assessed                                        Exercises Total Joint Exercises Ankle Circles/Pumps: AROM;Both;20 reps;Supine Quad Sets: AROM;Both;10 reps;Supine Short Arc Quad: AROM;Both;10 reps;Supine Heel Slides: AROM;Both;10 reps;Supine Hip ABduction/ADduction: Both;10 reps;Supine;AROM Marching in Standing: AROM;Both;10 reps;Standing    General Comments        Pertinent Vitals/Pain Pain Assessment: 0-10 Pain Score: 5  Pain Location: bilateral hips  Pain Descriptors / Indicators: Throbbing;Sore Pain Intervention(s): Monitored during session;Repositioned;Patient requesting pain meds-RN notified;Ice applied    Home Living  Prior Function            PT Goals (current goals can now be found in the care plan section) Acute Rehab PT Goals Patient Stated Goal: to go home Progress towards PT goals: Progressing toward goals    Frequency    7X/week      PT Plan Current plan  remains appropriate    Co-evaluation              AM-PAC PT "6 Clicks" Daily Activity  Outcome Measure  Difficulty turning over in bed (including adjusting bedclothes, sheets and blankets)?: None Difficulty moving from lying on back to sitting on the side of the bed? : None Difficulty sitting down on and standing up from a chair with arms (e.g., wheelchair, bedside commode, etc,.)?: None Help needed moving to and from a bed to chair (including a wheelchair)?: A Little Help needed walking in hospital room?: A Little Help needed climbing 3-5 steps with a railing? : A Little 6 Click Score: 21    End of Session Equipment Utilized During Treatment: Gait belt Activity Tolerance: Patient tolerated treatment well Patient left: in bed;with call bell/phone within reach;with family/visitor present Nurse Communication: Patient requests pain meds;Mobility status PT Visit Diagnosis: Other abnormalities of gait and mobility (R26.89);Pain;Muscle weakness (generalized) (M62.81) Pain - Right/Left: Left (and right) Pain - part of body: Hip     Time: 2979-8921 PT Time Calculation (min) (ACUTE ONLY): 27 min  Charges:  $Gait Training: 8-22 mins $Therapeutic Exercise: 8-22 mins                    G Codes:       Scheryl Marten PT, DPT  [phone]    Jacqulyn Liner Sloan Leiter 03/28/2017, 3:52 PM

## 2017-03-28 NOTE — Progress Notes (Signed)
Physical Therapy Treatment Patient Details Name: Patricia Davila MRN: [id number] DOB: Dec 29, 1970 Today's Date: 03/28/2017    History of Present Illness Pt is a 46 yo female admitted on 03/25/17 for elective bilateral THA. PMH significant for anemia, HTN, anxiety, OA, asthma, bipolar, depression, GERD.     PT Comments    Pt continues to make excellent progress with skilled therapy intervention. Performed supine exercises with good recall and technique noted this session. Gait distance increased with cues for proper foot positioning to reduce IR of left hip. Pt will benefit from progression to standing exercises and bed mobs to mimic home setting next session or AM session.     Follow Up Recommendations  DC plan and follow up therapy as arranged by surgeon     Equipment Recommendations  Rolling walker with 5" wheels;3in1 (PT)    Recommendations for Other Services       Precautions / Restrictions Precautions Precautions: None Precaution Comments: Bilateral THA Direct Anterior Restrictions Weight Bearing Restrictions: Yes RLE Weight Bearing: Weight bearing as tolerated LLE Weight Bearing: Weight bearing as tolerated    Mobility  Bed Mobility Overal bed mobility: Needs Assistance Bed Mobility: Supine to Sit;Sit to Supine     Supine to sit: Supervision Sit to supine: Supervision   General bed mobility comments: Supervision for all mobility this session. Will try without railings next session  Transfers Overall transfer level: Needs assistance Equipment used: Rolling walker (2 wheeled) Transfers: Sit to/from Stand Sit to Stand: Supervision         General transfer comment: supervision for safety. Good hand placement  Ambulation/Gait Ambulation/Gait assistance: Supervision Ambulation Distance (Feet): 500 Feet Assistive device: Rolling walker (2 wheeled) Gait Pattern/deviations: Step-through pattern;Decreased stride length;Antalgic Gait velocity: decreased Gait velocity  interpretation: Below normal speed for age/gender General Gait Details: Mild antalgic gait, cues for maintaining neutral toe positioning on LLE with gait in order to reduce any IR of hip.    Stairs            Wheelchair Mobility    Modified Rankin (Stroke Patients Only)       Balance Overall balance assessment: Needs assistance Sitting-balance support: No upper extremity supported;Feet supported Sitting balance-Leahy Scale: Normal     Standing balance support: No upper extremity supported Standing balance-Leahy Scale: Fair Standing balance comment: able to stand briefly without UE support                            Cognition Arousal/Alertness: Awake/alert Behavior During Therapy: WFL for tasks assessed/performed Overall Cognitive Status: Within Functional Limits for tasks assessed                                        Exercises Total Joint Exercises Ankle Circles/Pumps: AROM;Both;20 reps;Supine Quad Sets: AROM;Both;10 reps;Supine Heel Slides: AROM;Both;10 reps;Supine Hip ABduction/ADduction: Both;10 reps;Supine;AROM    General Comments        Pertinent Vitals/Pain Pain Assessment: 0-10 Pain Score: 7  Pain Location: bilateral hips 7/10 pre gait and 8-9/10 post gait Pain Descriptors / Indicators: Throbbing;Sore Pain Intervention(s): Monitored during session;Repositioned;Ice applied;Patient requesting pain meds-RN notified    Home Living                      Prior Function            PT Goals (current goals  can now be found in the care plan section) Acute Rehab PT Goals Patient Stated Goal: to go home Progress towards PT goals: Progressing toward goals    Frequency    7X/week      PT Plan Current plan remains appropriate    Co-evaluation              AM-PAC PT "6 Clicks" Daily Activity  Outcome Measure  Difficulty turning over in bed (including adjusting bedclothes, sheets and blankets)?:  None Difficulty moving from lying on back to sitting on the side of the bed? : None Difficulty sitting down on and standing up from a chair with arms (e.g., wheelchair, bedside commode, etc,.)?: A Little Help needed moving to and from a bed to chair (including a wheelchair)?: A Little Help needed walking in hospital room?: A Little Help needed climbing 3-5 steps with a railing? : A Little 6 Click Score: 20    End of Session Equipment Utilized During Treatment: Gait belt Activity Tolerance: Patient tolerated treatment well Patient left: in bed;with call bell/phone within reach;with family/visitor present Nurse Communication: Patient requests pain meds;Mobility status PT Visit Diagnosis: Other abnormalities of gait and mobility (R26.89);Pain;Muscle weakness (generalized) (M62.81) Pain - Right/Left: Left (right) Pain - part of body: Hip     Time: 3887-1959 PT Time Calculation (min) (ACUTE ONLY): 28 min  Charges:  $Gait Training: 8-22 mins $Therapeutic Exercise: 8-22 mins                    G Codes:       Scheryl Marten PT, DPT  [phone]    Patricia Davila 03/28/2017, 9:56 AM

## 2017-03-29 NOTE — Progress Notes (Signed)
Discharge teaching complete. Meds, diet, follow up appointments, incisional care, symptoms reviewed and all questions answered. Copy of discharge instructions and prescriptions given to patient. Patient discharged via wheelchair with NT and husband.

## 2017-03-29 NOTE — Discharge Summary (Signed)
Patient ID: Patricia Davila MRN: [id number] DOB/AGE: 1971/07/27 46 y.o.  Admit date: 03/25/2017 Discharge date: 03/29/2017  Admission Diagnoses:  Active Problems:   History of hip replacement   Discharge Diagnoses:  Same  Past Medical History:  Diagnosis Date  . Anemia    low iron during pregnancy  . Anxiety   . Arthritis   . Asthma    as a child  . Bipolar disorder ([place])   . Depression   . GERD (gastroesophageal reflux disease)   . Headache   . Hypertension   . Pneumonia    as a child  . PONV (postoperative nausea and vomiting)   . Restless legs     Surgeries: Procedure(s): BILATERAL ANTERIOR TOTAL HIP ARTHROPLASTY on 03/25/2017   Consultants:   Discharged Condition: Improved  Hospital Course: Patricia Davila is an 46 y.o. female who was admitted 03/25/2017 for operative treatment of<principal problem not specified>. Patient has severe unremitting pain that affects sleep, daily activities, and work/hobbies. After pre-op clearance the patient was taken to the operating room on 03/25/2017 and underwent  Procedure(s): [place].    Patient was given perioperative antibiotics: Anti-infectives    Start     Dose/Rate Route Frequency Ordered Stop   03/25/17 2130  ceFAZolin (ANCEF) IVPB 2g/100 mL premix     2 g 200 mL/hr over 30 Minutes Intravenous Every 6 hours 03/25/17 2035 03/26/17 0415   03/25/17 1022  ceFAZolin (ANCEF) 2-4 GM/100ML-% IVPB    Comments:  Rosenberger, Meredit: cabinet override      03/25/17 1022 03/25/17 1250   03/25/17 1021  ceFAZolin (ANCEF) IVPB 2g/100 mL premix     2 g 200 mL/hr over 30 Minutes Intravenous On call to O.R. 03/25/17 1021 03/25/17 1250       Patient was given sequential compression devices, early ambulation, and chemoprophylaxis to prevent DVT.  Patient benefited maximally from hospital stay and there were no complications.    Recent vital signs: Patient Vitals for the past 24 hrs:  BP Temp Temp src Pulse  Resp SpO2  03/28/17 2239 (!) 163/70 98.3 F (36.8 C) Oral 71 16 100 %  03/28/17 1636 129/73 98.4 F (36.9 C) Oral 72 16 100 %  03/28/17 0900 128/79 (!) 97.1 F (36.2 C) Oral 80 16 100 %     Recent laboratory studies: No results for input(s): WBC, HGB, HCT, PLT, NA, K, CL, CO2, BUN, CREATININE, GLUCOSE, INR, CALCIUM in the last 72 hours.  Invalid input(s): PT, 2   Discharge Medications:   Allergies as of 03/29/2017      Reactions   Ativan [lorazepam] Anxiety   HALLUCINATIONS   Tylenol With Codeine #3 [acetaminophen-codeine] Hives, Itching      Medication List    STOP taking these medications   naproxen sodium 220 MG tablet Commonly known as:  ANAPROX     TAKE these medications   escitalopram 5 MG tablet Commonly known as:  LEXAPRO Take 1 tablet (5 mg total) by mouth daily.   hydrochlorothiazide 25 MG tablet Commonly known as:  HYDRODIURIL Take 1 tablet (25 mg total) by mouth daily. Take on tablet in the morning.   losartan 50 MG tablet Commonly known as:  COZAAR Take 1 tablet (50 mg total) by mouth daily.   methocarbamol 750 MG tablet Commonly known as:  ROBAXIN Take 1 tablet (750 mg total) by mouth 2 (two) times daily as needed for muscle spasms.   nicotine 21 mg/24hr patch Commonly known  as:  NICODERM CQ - dosed in mg/24 hours Place 21 mg onto the skin daily.   omeprazole 40 MG capsule Commonly known as:  PRILOSEC Take 1 capsule (40 mg total) by mouth daily.   ondansetron 4 MG tablet Commonly known as:  ZOFRAN Take 1-2 tablets (4-8 mg total) by mouth every 8 (eight) hours as needed for nausea or vomiting.   oxyCODONE 5 MG immediate release tablet Commonly known as:  Oxy IR/ROXICODONE Take 1-3 tablets (5-15 mg total) by mouth every 4 (four) hours as needed.   oxyCODONE 10 mg 12 hr tablet Commonly known as:  OXYCONTIN Take 1 tablet (10 mg total) by mouth every 12 (twelve) hours.   promethazine 25 MG tablet Commonly known as:  PHENERGAN Take 1 tablet  (25 mg total) by mouth every 6 (six) hours as needed for nausea.   rivaroxaban 10 MG Tabs tablet Commonly known as:  XARELTO Take 1 tablet (10 mg total) by mouth daily.   senna-docusate 8.6-50 MG tablet Commonly known as:  SENOKOT S Take 1 tablet by mouth at bedtime as needed.   SUMAtriptan 25 MG tablet Commonly known as:  IMITREX Take 1 tablet (25 mg total) by mouth daily. May take one more tablet two hours after the first. No more than two tablets per day. What changed:  when to take this  reasons to take this  additional instructions   traMADol 50 MG tablet Commonly known as:  ULTRAM Take 1 tablet (50 mg total) by mouth every 6 (six) hours as needed. What changed:  reasons to take this            Durable Medical Equipment        Start     Ordered   03/25/17 2036  DME Walker rolling  Once    Question:  Patient needs a walker to treat with the following condition  Answer:  History of hip replacement   03/25/17 2035   03/25/17 2036  DME 3 n 1  Once     03/25/17 2035   03/25/17 2036  DME Bedside commode  Once    Question:  Patient needs a bedside commode to treat with the following condition  Answer:  History of hip replacement   03/25/17 2035      Diagnostic Studies: Dg Pelvis Portable  Result Date: 03/25/2017 CLINICAL DATA:  Status post bilateral total hip arthroplasty. EXAM: PORTABLE PELVIS 1-2 VIEWS COMPARISON:  Intraoperative imaging earlier today. FINDINGS: Frontal projection of the pelvis as well as additional image to complete visualization of the right femoral prosthesis demonstrates normal alignment of bilateral total hip arthroplasties. No complications evident. No fractures or abnormal lucencies. Acetabular components and screws appear appropriately positioned. Femoral stems normally aligned. IMPRESSION: Normal alignment and positioning of bilateral total hip arthroplasties. Electronically Signed   By: Aletta Edouard M.D.   On: 03/25/2017 18:49   Dg C-arm  Gt 120 Min  Result Date: 03/25/2017 CLINICAL DATA:  Bilateral anterior hip. EXAM: OPERATIVE BILATERAL HIP (WITH PELVIS IF PERFORMED) 3 VIEWS TECHNIQUE: Fluoroscopic spot image(s) were submitted for interpretation post-operatively. COMPARISON:  12/15/2016 FINDINGS: Examination demonstrates evidence of patient's recent bilateral total hip arthroplasties which are intact. Tiny space between the medial proximal aspect of the right femoral component and the adjacent medial trochanteric bone. Recommend correlation with findings at the time of the procedure. IMPRESSION: Bilateral total hip arthroplasties intact. Electronically Signed   By: Marin Olp M.D.   On: 03/25/2017 16:52   Dg Hip Operative  Unilat With Pelvis Left  Result Date: 03/25/2017 CLINICAL DATA:  Bilateral anterior hip. EXAM: OPERATIVE BILATERAL HIP (WITH PELVIS IF PERFORMED) 3 VIEWS TECHNIQUE: Fluoroscopic spot image(s) were submitted for interpretation post-operatively. COMPARISON:  12/15/2016 FINDINGS: Examination demonstrates evidence of patient's recent bilateral total hip arthroplasties which are intact. Tiny space between the medial proximal aspect of the right femoral component and the adjacent medial trochanteric bone. Recommend correlation with findings at the time of the procedure. IMPRESSION: Bilateral total hip arthroplasties intact. Electronically Signed   By: Marin Olp M.D.   On: 03/25/2017 16:52   Dg Hip Operative Unilat W Or W/o Pelvis Right  Result Date: 03/25/2017 CLINICAL DATA:  Bilateral anterior hip. EXAM: OPERATIVE BILATERAL HIP (WITH PELVIS IF PERFORMED) 3 VIEWS TECHNIQUE: Fluoroscopic spot image(s) were submitted for interpretation post-operatively. COMPARISON:  12/15/2016 FINDINGS: Examination demonstrates evidence of patient's recent bilateral total hip arthroplasties which are intact. Tiny space between the medial proximal aspect of the right femoral component and the adjacent medial trochanteric bone. Recommend  correlation with findings at the time of the procedure. IMPRESSION: Bilateral total hip arthroplasties intact. Electronically Signed   By: Marin Olp M.D.   On: 03/25/2017 16:52    Disposition: 01-Home or Self Care  Discharge Instructions    Discharge patient    Complete by:  As directed    Discharge disposition:  01-Home or Self Care   Discharge patient date:  03/29/2017      Follow-up Information    Leandrew Koyanagi, MD Follow up in 2 week(s).   Specialty:  Orthopedic Surgery Why:  For suture removal, For wound re-check Contact information: [place] [address] [phone]        [facility] Follow up.   Specialty:  [place] Why:  A representative from [facility] will contact you to arrange start date and time for your therapy. Contact information: [address] [place] [address] [phone]            Signed: Mcarthur Rossetti 03/29/2017, 7:24 AM

## 2017-03-29 NOTE — Progress Notes (Signed)
Physical Therapy Treatment Patient Details Name: Patricia Davila MRN: [id number] DOB: [birth year] Today's Date: 03/29/2017    History of Present Illness Pt is a 46 yo female admitted on 03/25/17 for elective bilateral THA. PMH significant for anemia, HTN, anxiety, OA, asthma, bipolar, depression, GERD.     PT Comments    Today's skilled session focused on completing HEP, stair and gait training. Pt is mobilizing well and expected to d/c home this AM.   Follow Up Recommendations  DC plan and follow up therapy as arranged by surgeon     Equipment Recommendations  Rolling walker with 5" wheels;3in1 (PT)    Recommendations for Other Services       Precautions / Restrictions Precautions Precautions: None Precaution Comments: Bilateral THA Direct Anterior Restrictions Weight Bearing Restrictions: Yes RLE Weight Bearing: Weight bearing as tolerated LLE Weight Bearing: Weight bearing as tolerated    Mobility  Bed Mobility               General bed mobility comments: in chair on arrival  Transfers Overall transfer level: Needs assistance Equipment used: Rolling walker (2 wheeled) Transfers: Sit to/from Stand Sit to Stand: Supervision         General transfer comment: supervision for safety. Good hand placement, no cueing required  Ambulation/Gait Ambulation/Gait assistance: Supervision Ambulation Distance (Feet): 400 Feet Assistive device: Rolling walker (2 wheeled) Gait Pattern/deviations: Step-through pattern;Decreased stride length;Antalgic Gait velocity: decreased Gait velocity interpretation: Below normal speed for age/gender General Gait Details: Mild antalgic gait, cues for maintaining neutral toe positioning on LLE with gait in order to reduce any IR of hip. Cues for walker proximity.   Stairs Stairs: Yes   Stair Management: No rails;Step to pattern;Forwards;Backwards;With walker Number of Stairs: 2 General stair comments: Negotiated steps, once forward  and a second time had pt ascend backwards as she will need to step up backwards to enter spouses car.  Wheelchair Mobility    Modified Rankin (Stroke Patients Only)       Balance Overall balance assessment: Needs assistance Sitting-balance support: No upper extremity supported;Feet supported Sitting balance-Leahy Scale: Normal     Standing balance support: No upper extremity supported Standing balance-Leahy Scale: Good Standing balance comment: able to stand briefly without UE support                            Cognition Arousal/Alertness: Awake/alert Behavior During Therapy: WFL for tasks assessed/performed Overall Cognitive Status: Within Functional Limits for tasks assessed                                        Exercises Total Joint Exercises Hip ABduction/ADduction: Both;10 reps;AROM;Standing Knee Flexion: Both;10 reps;AROM;Standing Marching in Standing: AROM;Both;10 reps;Standing Standing Hip Extension: Both;10 reps;AROM;Standing    General Comments        Pertinent Vitals/Pain Pain Assessment: Faces Faces Pain Scale: Hurts little more Pain Location: bilateral hips  Pain Descriptors / Indicators: Throbbing;Sore Pain Intervention(s): Monitored during session;Ice applied    Home Living                      Prior Function            PT Goals (current goals can now be found in the care plan section) Acute Rehab PT Goals Patient Stated Goal: to go home PT Goal Formulation: With patient  Time For Goal Achievement: 04/02/17 Potential to Achieve Goals: Good Progress towards PT goals: Progressing toward goals    Frequency    7X/week      PT Plan Current plan remains appropriate    Co-evaluation              AM-PAC PT "6 Clicks" Daily Activity  Outcome Measure  Difficulty turning over in bed (including adjusting bedclothes, sheets and blankets)?: None Difficulty moving from lying on back to sitting on the  side of the bed? : None Difficulty sitting down on and standing up from a chair with arms (e.g., wheelchair, bedside commode, etc,.)?: None Help needed moving to and from a bed to chair (including a wheelchair)?: A Little Help needed walking in hospital room?: A Little Help needed climbing 3-5 steps with a railing? : A Little 6 Click Score: 21    End of Session Equipment Utilized During Treatment: Gait belt Activity Tolerance: Patient tolerated treatment well Patient left: with call bell/phone within reach;with family/visitor present;in chair Nurse Communication: Mobility status PT Visit Diagnosis: Other abnormalities of gait and mobility (R26.89);Pain;Muscle weakness (generalized) (M62.81) Pain - Right/Left: Left (and right) Pain - part of body: Hip     Time: 7737-3668 PT Time Calculation (min) (ACUTE ONLY): 19 min  Charges:  $Gait Training: 8-22 mins                    G Codes:       Benjiman Core, [place] Pager [phone] Acute Rehab   Allena Katz 03/29/2017, 10:21 AM

## 2017-03-29 NOTE — Care Management Important Message (Signed)
Important Message  Patient Details  Name: Patricia Davila MRN: [id number] Date of Birth: [birth year]   Medicare Important Message Given:  Yes    Ahmiya Abee 03/29/2017, 11:27 AM

## 2017-03-29 NOTE — Progress Notes (Signed)
Patient ID: Patricia Davila, female   DOB: 1971-06-20, 46 y.o.   MRN: [id number] Doing well.  Can be discharged to home today.

## 2017-03-31 ENCOUNTER — Telehealth (INDEPENDENT_AMBULATORY_CARE_PROVIDER_SITE_OTHER): Payer: Self-pay | Admitting: *Deleted

## 2017-03-31 NOTE — Telephone Encounter (Signed)
Received call from [place] from [facility] advising PT will be out to pt's home tomorrow for PT.

## 2017-04-01 ENCOUNTER — Telehealth (INDEPENDENT_AMBULATORY_CARE_PROVIDER_SITE_OTHER): Payer: Self-pay | Admitting: Orthopaedic Surgery

## 2017-04-01 NOTE — Telephone Encounter (Signed)
Called Patricia Davila to advise on message approved

## 2017-04-01 NOTE — Telephone Encounter (Signed)
Dorian called from [facility] and asking for verbal approval of 2 times a week for 3 weeks and also a verbal start of care for today (8/16) CB # [phone]

## 2017-04-07 ENCOUNTER — Ambulatory Visit (INDEPENDENT_AMBULATORY_CARE_PROVIDER_SITE_OTHER): Payer: Medicare Other | Admitting: Physician Assistant

## 2017-04-08 ENCOUNTER — Ambulatory Visit (INDEPENDENT_AMBULATORY_CARE_PROVIDER_SITE_OTHER): Payer: Medicare Other | Admitting: Orthopaedic Surgery

## 2017-04-08 ENCOUNTER — Encounter (INDEPENDENT_AMBULATORY_CARE_PROVIDER_SITE_OTHER): Payer: Self-pay | Admitting: Orthopaedic Surgery

## 2017-04-08 DIAGNOSIS — M1611 Unilateral primary osteoarthritis, right hip: Secondary | ICD-10-CM

## 2017-04-08 DIAGNOSIS — M1612 Unilateral primary osteoarthritis, left hip: Secondary | ICD-10-CM

## 2017-04-08 MED ORDER — OXYCODONE HCL 5 MG PO TABS: 5.0000 mg | ORAL_TABLET | Freq: Three times a day (TID) | ORAL | 0 refills | Status: DC | PRN

## 2017-04-08 MED ORDER — ASPIRIN EC 325 MG PO TBEC: 325.0000 mg | DELAYED_RELEASE_TABLET | Freq: Two times a day (BID) | ORAL | 0 refills | Status: DC

## 2017-04-08 MED ORDER — METHOCARBAMOL 750 MG PO TABS: 750.0000 mg | ORAL_TABLET | Freq: Two times a day (BID) | ORAL | 1 refills | Status: DC | PRN

## 2017-04-08 NOTE — Progress Notes (Signed)
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 She is progressing with home health physical therapy. Overall she is doing well and happy with her progress. She takes oxycodone for breakthrough pain. Her incisions are healed without any signs of infection. She has expected postoperative swelling. She is ambulating with a walker. Leg lengths are equal. Oxycodone and Robaxin were refilled today. I gave her a referral for outpatient physical therapy once she finishes home physical therapy. I'll see her back in 4 weeks with standing AP pelvis

## 2017-04-12 ENCOUNTER — Ambulatory Visit (INDEPENDENT_AMBULATORY_CARE_PROVIDER_SITE_OTHER): Payer: Medicare Other | Admitting: Physician Assistant

## 2017-04-20 ENCOUNTER — Telehealth (INDEPENDENT_AMBULATORY_CARE_PROVIDER_SITE_OTHER): Payer: Self-pay | Admitting: Orthopaedic Surgery

## 2017-04-20 NOTE — Telephone Encounter (Signed)
Please advise 

## 2017-04-20 NOTE — Telephone Encounter (Signed)
Patient called needing Rx refilled (Oxycodone) The number to contact patient is [phone]

## 2017-04-20 NOTE — Telephone Encounter (Signed)
#  20.  1 tab po daily prn.

## 2017-04-22 ENCOUNTER — Telehealth (INDEPENDENT_AMBULATORY_CARE_PROVIDER_SITE_OTHER): Payer: Self-pay | Admitting: Physician Assistant

## 2017-04-22 MED ORDER — OXYCODONE-ACETAMINOPHEN 5-325 MG PO TABS: ORAL_TABLET | ORAL | 0 refills | Status: DC

## 2017-04-22 NOTE — Telephone Encounter (Signed)
Tried to call patient and says person you are calling cannot accept calls at this time.  RX ready for pick up at the front desk.

## 2017-04-22 NOTE — Telephone Encounter (Signed)
Patient called requesting a Tramadol Refill  Thank You

## 2017-04-22 NOTE — Telephone Encounter (Signed)
Patient is requesting a refill on tramadol

## 2017-04-22 NOTE — Telephone Encounter (Signed)
Sorry but narcotic pain meds for chronic conditions can no longer be prescribed here. She has to go to pain clinic.

## 2017-04-22 NOTE — Telephone Encounter (Signed)
RX PRINTED

## 2017-04-23 NOTE — Telephone Encounter (Signed)
Patient has an appointment 04-28-17 @ 3:15pm   Thank you

## 2017-04-28 ENCOUNTER — Ambulatory Visit (INDEPENDENT_AMBULATORY_CARE_PROVIDER_SITE_OTHER): Payer: Medicare Other | Admitting: Physician Assistant

## 2017-05-05 ENCOUNTER — Other Ambulatory Visit (INDEPENDENT_AMBULATORY_CARE_PROVIDER_SITE_OTHER): Payer: Self-pay | Admitting: Orthopaedic Surgery

## 2017-05-06 NOTE — Telephone Encounter (Signed)
Norco 5/325. 1-2 tabs po daily prn pain.  #30

## 2017-05-07 ENCOUNTER — Other Ambulatory Visit (INDEPENDENT_AMBULATORY_CARE_PROVIDER_SITE_OTHER): Payer: Self-pay

## 2017-05-07 ENCOUNTER — Telehealth (INDEPENDENT_AMBULATORY_CARE_PROVIDER_SITE_OTHER): Payer: Self-pay

## 2017-05-07 MED ORDER — HYDROCODONE-ACETAMINOPHEN 5-325 MG PO TABS: ORAL_TABLET | ORAL | 0 refills | Status: DC

## 2017-05-07 NOTE — Telephone Encounter (Signed)
Rx ready for pick up at the front desk, called patient and states they cannot accept calls

## 2017-05-11 ENCOUNTER — Telehealth (INDEPENDENT_AMBULATORY_CARE_PROVIDER_SITE_OTHER): Payer: Self-pay | Admitting: Orthopaedic Surgery

## 2017-05-11 NOTE — Telephone Encounter (Signed)
Please advise.  FYI: Last Rx for Hydrocodone given 05/07/17.

## 2017-05-11 NOTE — Telephone Encounter (Signed)
Patient called asking for a refill on hydrocodone and muscle relaxers. CB # [phone] Has appointment on Thursday (9/27)

## 2017-05-12 NOTE — Telephone Encounter (Signed)
Called patient to advise  °

## 2017-05-12 NOTE — Telephone Encounter (Signed)
Wait until f/u appt

## 2017-05-13 ENCOUNTER — Ambulatory Visit (INDEPENDENT_AMBULATORY_CARE_PROVIDER_SITE_OTHER): Payer: Medicare Other | Admitting: Orthopaedic Surgery

## 2017-05-13 ENCOUNTER — Encounter (INDEPENDENT_AMBULATORY_CARE_PROVIDER_SITE_OTHER): Payer: Self-pay | Admitting: Orthopaedic Surgery

## 2017-05-13 DIAGNOSIS — M1612 Unilateral primary osteoarthritis, left hip: Secondary | ICD-10-CM

## 2017-05-13 DIAGNOSIS — M1611 Unilateral primary osteoarthritis, right hip: Secondary | ICD-10-CM

## 2017-05-13 MED ORDER — METHOCARBAMOL 500 MG PO TABS: 500.0000 mg | ORAL_TABLET | Freq: Four times a day (QID) | ORAL | 2 refills | Status: DC | PRN

## 2017-05-13 MED ORDER — HYDROCODONE-ACETAMINOPHEN 5-325 MG PO TABS: 1.0000 | ORAL_TABLET | Freq: Every day | ORAL | 0 refills | Status: DC | PRN

## 2017-05-13 NOTE — Progress Notes (Signed)
Patricia Davila is 7 weeks status post bilateral total hip replacement. She has finished home physical therapy. She takes occasional Norco and Robaxin. Overall she is happy with her progress. Her right hip is a little bit more symptomatic and weaker.  Surgical scars are fully healed. She has equal leg lengths. She is ambulating with a cane.  At this point she may discontinue aspirin use. Referral to outpatient physical therapy. Handicap pass for 6 months. Patient is doing well overall. Refill for Norco and Robaxin. Follow-up in 6 weeks with standing AP pelvis x-ray

## 2017-05-31 ENCOUNTER — Telehealth (INDEPENDENT_AMBULATORY_CARE_PROVIDER_SITE_OTHER): Payer: Self-pay | Admitting: Orthopaedic Surgery

## 2017-05-31 MED ORDER — HYDROCODONE-ACETAMINOPHEN 5-325 MG PO TABS: ORAL_TABLET | ORAL | 0 refills | Status: DC

## 2017-05-31 NOTE — Telephone Encounter (Signed)
See message below °

## 2017-05-31 NOTE — Telephone Encounter (Signed)
rx ready for pickup 

## 2017-05-31 NOTE — Telephone Encounter (Signed)
Norco 1 tab po daily prn pain #20

## 2017-05-31 NOTE — Addendum Note (Signed)
Addended by: Precious Bard on: 05/31/2017 04:07 PM   Modules accepted: Orders

## 2017-05-31 NOTE — Telephone Encounter (Signed)
Patient called asking for a refill on hydrocodone. CB # [phone]

## 2017-06-11 ENCOUNTER — Ambulatory Visit (INDEPENDENT_AMBULATORY_CARE_PROVIDER_SITE_OTHER): Payer: Medicare Other | Admitting: Physician Assistant

## 2017-06-24 ENCOUNTER — Ambulatory Visit (INDEPENDENT_AMBULATORY_CARE_PROVIDER_SITE_OTHER): Payer: Medicare Other | Admitting: Orthopaedic Surgery

## 2017-09-16 ENCOUNTER — Ambulatory Visit (INDEPENDENT_AMBULATORY_CARE_PROVIDER_SITE_OTHER): Payer: Medicare Other | Admitting: Physician Assistant

## 2017-10-05 ENCOUNTER — Ambulatory Visit (INDEPENDENT_AMBULATORY_CARE_PROVIDER_SITE_OTHER): Payer: Medicare Other | Admitting: Physician Assistant

## 2017-10-06 ENCOUNTER — Other Ambulatory Visit (INDEPENDENT_AMBULATORY_CARE_PROVIDER_SITE_OTHER): Payer: Self-pay | Admitting: Physician Assistant

## 2017-10-06 DIAGNOSIS — G43909 Migraine, unspecified, not intractable, without status migrainosus: Secondary | ICD-10-CM

## 2017-10-06 DIAGNOSIS — K219 Gastro-esophageal reflux disease without esophagitis: Secondary | ICD-10-CM

## 2017-10-18 NOTE — Telephone Encounter (Signed)
FWD to PCP. Maxamilian Amadon S Abdirizak Richison, CMA  

## 2017-11-25 ENCOUNTER — Encounter (INDEPENDENT_AMBULATORY_CARE_PROVIDER_SITE_OTHER): Payer: Self-pay

## 2017-11-25 ENCOUNTER — Ambulatory Visit (INDEPENDENT_AMBULATORY_CARE_PROVIDER_SITE_OTHER): Payer: Medicare Other | Admitting: Orthopaedic Surgery

## 2017-12-07 ENCOUNTER — Other Ambulatory Visit (INDEPENDENT_AMBULATORY_CARE_PROVIDER_SITE_OTHER): Payer: Self-pay | Admitting: Physician Assistant

## 2017-12-07 ENCOUNTER — Telehealth (INDEPENDENT_AMBULATORY_CARE_PROVIDER_SITE_OTHER): Payer: Self-pay | Admitting: Physician Assistant

## 2017-12-07 DIAGNOSIS — I10 Essential (primary) hypertension: Secondary | ICD-10-CM

## 2017-12-07 MED ORDER — LOSARTAN POTASSIUM 50 MG PO TABS: 50.0000 mg | ORAL_TABLET | Freq: Every day | ORAL | 3 refills | Status: DC

## 2017-12-07 NOTE — Telephone Encounter (Signed)
Pt called to request her  -losartan (COZAAR) 50 MG tablet  Sent to [facility] [place], [place] - [place] AT [address] [place] Please follow up

## 2017-12-07 NOTE — Telephone Encounter (Signed)
Sent Losartan as requested.

## 2017-12-21 ENCOUNTER — Ambulatory Visit (INDEPENDENT_AMBULATORY_CARE_PROVIDER_SITE_OTHER): Payer: Medicare Other | Admitting: Physician Assistant

## 2018-01-06 ENCOUNTER — Telehealth (INDEPENDENT_AMBULATORY_CARE_PROVIDER_SITE_OTHER): Payer: Self-pay | Admitting: Physician Assistant

## 2018-01-06 NOTE — Telephone Encounter (Signed)
Denied. Needs office visit. She has not been seen in almost one year

## 2018-01-06 NOTE — Telephone Encounter (Signed)
Pt called to request a medication refill on all her BP medications and her stomach pain medications, until her next appt. Please follow up  Patients pharmacy-[facility] [place], [place] AT [place] [address]

## 2018-02-03 ENCOUNTER — Encounter (INDEPENDENT_AMBULATORY_CARE_PROVIDER_SITE_OTHER): Payer: Self-pay | Admitting: Physician Assistant

## 2018-02-03 ENCOUNTER — Ambulatory Visit (INDEPENDENT_AMBULATORY_CARE_PROVIDER_SITE_OTHER): Payer: Medicare Other | Admitting: Physician Assistant

## 2018-02-03 ENCOUNTER — Other Ambulatory Visit: Payer: Self-pay

## 2018-02-03 VITALS — BP 127/84 | HR 73 | Temp 98.3°F | Ht 61.0 in | Wt 193.0 lb

## 2018-02-03 DIAGNOSIS — M25552 Pain in left hip: Secondary | ICD-10-CM

## 2018-02-03 DIAGNOSIS — R5383 Other fatigue: Secondary | ICD-10-CM

## 2018-02-03 DIAGNOSIS — I10 Essential (primary) hypertension: Secondary | ICD-10-CM

## 2018-02-03 MED ORDER — METHOCARBAMOL 500 MG PO TABS: 500.0000 mg | ORAL_TABLET | Freq: Four times a day (QID) | ORAL | 2 refills | Status: DC | PRN

## 2018-02-03 MED ORDER — HYDROCHLOROTHIAZIDE 25 MG PO TABS: 25.0000 mg | ORAL_TABLET | Freq: Every day | ORAL | 1 refills | Status: DC

## 2018-02-03 MED ORDER — ASPIRIN EC 325 MG PO TBEC: 325.0000 mg | DELAYED_RELEASE_TABLET | Freq: Once | ORAL | 11 refills | Status: AC

## 2018-02-03 MED ORDER — LOSARTAN POTASSIUM 50 MG PO TABS: 50.0000 mg | ORAL_TABLET | Freq: Every day | ORAL | 1 refills | Status: DC

## 2018-02-03 NOTE — Progress Notes (Signed)
Subjective:  Patient ID: Patricia Davila, female    DOB: Oct 09, 1970  Age: 47 y.o. MRN: [id number]  CC: f/u HTN  HPI Patricia Davila is a 47 y.o. female with a PMH of asthma, HTN, bilateral hip pain, GERD, PNA, anxiety, and depression presents to f/u on HTN. Last seen here one year ago. BP 120/78 mmHg at the time. Taking HCTZ 25 mg, Losartan 50 mg as directed. BP 127/84 mmHg today. Does not endorse CP, palpitations, SOB, HA, tingling, numbness, LE edema, rash, or GI/GU sxs.      Would like a referral back to orthopedics due to continue left hip pain s/p hip arthroplasty nearly ten months ago.  Pain is described as a throbbing and aching pain that occurs nearly every day. Takes NSAIDs with no relief of pain. Rest usually helps with leg elevation.      Outpatient Medications Prior to Visit  Medication Sig Dispense Refill  . hydrochlorothiazide (HYDRODIURIL) 25 MG tablet Take 1 tablet (25 mg total) by mouth daily. Take on tablet in the morning. 90 tablet 1  . losartan (COZAAR) 50 MG tablet Take 1 tablet (50 mg total) by mouth daily. 90 tablet 3  . omeprazole (PRILOSEC) 40 MG capsule take 1 capsule by mouth once daily 30 capsule 3  . SUMAtriptan (IMITREX) 25 MG tablet TAKE 1 TABLET DAILY. MAY TAKE 1 MORE TABLET 2 HRS AFTER 1ST. NO MORE THAN 2 TABLETS PER DAY 30 tablet 0  . aspirin EC 325 MG tablet Take 1 tablet (325 mg total) by mouth 2 (two) times daily. (Patient not taking: Reported on 02/03/2018) 56 tablet 0  . HYDROcodone-acetaminophen (NORCO) 5-325 MG tablet 1 tab po daily prn pain. (Patient not taking: Reported on 02/03/2018) 20 tablet 0  . HYDROcodone-acetaminophen (NORCO/VICODIN) 5-325 MG tablet 1-2 tabs po daily prn pain. (Patient not taking: Reported on 02/03/2018) 30 tablet 0  . methocarbamol (ROBAXIN) 500 MG tablet Take 1 tablet (500 mg total) by mouth every 6 (six) hours as needed for muscle spasms. (Patient not taking: Reported on 02/03/2018) 30 tablet 2  . methocarbamol (ROBAXIN) 750 MG  tablet Take 1 tablet (750 mg total) by mouth 2 (two) times daily as needed for muscle spasms. (Patient not taking: Reported on 02/03/2018) 60 tablet 0  . methocarbamol (ROBAXIN) 750 MG tablet Take 1 tablet (750 mg total) by mouth 2 (two) times daily as needed for muscle spasms. (Patient not taking: Reported on 02/03/2018) 60 tablet 1  . nicotine (NICODERM CQ - DOSED IN MG/24 HOURS) 21 mg/24hr patch Place 21 mg onto the skin daily.    . traMADol (ULTRAM) 50 MG tablet Take 1 tablet (50 mg total) by mouth every 6 (six) hours as needed. (Patient not taking: Reported on 02/03/2018) 30 tablet 0  . escitalopram (LEXAPRO) 5 MG tablet Take 1 tablet (5 mg total) by mouth daily. (Patient not taking: Reported on 03/19/2017) 30 tablet 3  . ondansetron (ZOFRAN) 4 MG tablet Take 1-2 tablets (4-8 mg total) by mouth every 8 (eight) hours as needed for nausea or vomiting. 40 tablet 0  . oxyCODONE (OXY IR/ROXICODONE) 5 MG immediate release tablet Take 1-3 tablets (5-15 mg total) by mouth every 4 (four) hours as needed. 90 tablet 0  . oxyCODONE (OXY IR/ROXICODONE) 5 MG immediate release tablet Take 1 tablet (5 mg total) by mouth 3 (three) times daily as needed. 30 tablet 0  . oxyCODONE (OXYCONTIN) 10 mg 12 hr tablet Take 1 tablet (10 mg total) by mouth  every 12 (twelve) hours. 10 tablet 0  . oxyCODONE-acetaminophen (PERCOCET/ROXICET) 5-325 MG tablet 1 tab po daily prn. 20 tablet 0  . promethazine (PHENERGAN) 25 MG tablet Take 1 tablet (25 mg total) by mouth every 6 (six) hours as needed for nausea. 30 tablet 1  . rivaroxaban (XARELTO) 10 MG TABS tablet Take 1 tablet (10 mg total) by mouth daily. 14 tablet 0  . senna-docusate (SENOKOT S) 8.6-50 MG tablet Take 1 tablet by mouth at bedtime as needed. 30 tablet 1   No facility-administered medications prior to visit.      ROS Review of Systems  Constitutional: Negative for chills, fever and malaise/fatigue.  Eyes: Negative for blurred vision.  Respiratory: Negative for  shortness of breath.   Cardiovascular: Negative for chest pain and palpitations.  Gastrointestinal: Negative for abdominal pain and nausea.  Genitourinary: Negative for dysuria and hematuria.  Musculoskeletal: Negative for joint pain and myalgias.  Skin: Negative for rash.  Neurological: Negative for tingling and headaches.  Psychiatric/Behavioral: Negative for depression. The patient is not nervous/anxious.     Objective:  Ht 5\' 1"  (1.549 m)   Wt 193 lb (87.5 kg)   BMI 36.47 kg/m   Vitals:   02/03/18 1359  BP: 127/84  Pulse: 73  Temp: 98.3 F (36.8 C)  TempSrc: Oral  SpO2: 100%  Weight: 193 lb (87.5 kg)  Height: 5\' 1"  (1.549 m)     Physical Exam  Constitutional: She is oriented to person, place, and time.  Well developed, well nourished, NAD, polite  HENT:  Head: Normocephalic and atraumatic.  Eyes: No scleral icterus.  Neck: Normal range of motion. Neck supple. No thyromegaly present.  Cardiovascular: Normal rate, regular rhythm and normal heart sounds.  No LE edema bilaterally.  Pulmonary/Chest: Effort normal and breath sounds normal.  Abdominal: Soft. Bowel sounds are normal. There is no tenderness.  Musculoskeletal: She exhibits no edema.  Neurological: She is alert and oriented to person, place, and time.  Normal gait.   Skin: Skin is warm and dry. No rash noted. No erythema. No pallor.  Psychiatric: She has a normal mood and affect. Her behavior is normal. Thought content normal.  Vitals reviewed.    Assessment & Plan:    1. Essential hypertension, benign - hydrochlorothiazide (HYDRODIURIL) 25 MG tablet; Take 1 tablet (25 mg total) by mouth daily. Take on tablet in the morning.  Dispense: 90 tablet; Refill: 1 - losartan (COZAAR) 50 MG tablet; Take 1 tablet (50 mg total) by mouth daily.  Dispense: 90 tablet; Refill: 1 - aspirin EC 325 MG tablet; Take 1 tablet (325 mg total) by mouth once for 1 dose.  Dispense: 90 tablet; Refill: 11  2. Fatigue,  unspecified type - CBC with Differential - Comprehensive metabolic panel - Lipid panel - TSH  3. Left hip pain - AMB referral to orthopedics - methocarbamol (ROBAXIN) 500 MG tablet; Take 1 tablet (500 mg total) by mouth every 6 (six) hours as needed for muscle spasms.  Dispense: 30 tablet; Refill: 2   Meds ordered this encounter  Medications  . hydrochlorothiazide (HYDRODIURIL) 25 MG tablet    Sig: Take 1 tablet (25 mg total) by mouth daily. Take on tablet in the morning.    Dispense:  90 tablet    Refill:  1    Order Specific Question:   Supervising Provider    Answer:   Charlott Rakes [[id number]]  . losartan (COZAAR) 50 MG tablet    Sig: Take 1 tablet (50  mg total) by mouth daily.    Dispense:  90 tablet    Refill:  1    Order Specific Question:   Supervising Provider    Answer:   Charlott Rakes [[id number]]  . aspirin EC 325 MG tablet    Sig: Take 1 tablet (325 mg total) by mouth once for 1 dose.    Dispense:  90 tablet    Refill:  11    Order Specific Question:   Supervising Provider    Answer:   Charlott Rakes [[id number]]  . methocarbamol (ROBAXIN) 500 MG tablet    Sig: Take 1 tablet (500 mg total) by mouth every 6 (six) hours as needed for muscle spasms.    Dispense:  30 tablet    Refill:  2    Order Specific Question:   Supervising Provider    Answer:   Charlott Rakes [[id number]]    Follow-up: Return in about 1 month (around 03/03/2018) for PAP and mammography.   Clent Demark PA

## 2018-02-03 NOTE — Patient Instructions (Signed)
  Take Vitamin D 2000 IU daily and Vitamin B Complex daily   Fatigue Fatigue is feeling tired all of the time, a lack of energy,  or a lack of motivation. Occasional or mild fatigue is often a normal response to activity or life in general. However, long-lasting (chronic) or extreme fatigue may indicate an underlying medical condition. Follow these instructions at home: Watch your fatigue for any changes. The following actions may help to lessen any discomfort you are feeling:  Talk to your health care provider about how much sleep you need each night. Try to get the required amount every night.  Take medicines only as directed by your health care provider.  Eat a healthy and nutritious diet. Ask your health care provider if you need help changing your diet.  Drink enough fluid to keep your urine clear or pale yellow.  Practice ways of relaxing, such as yoga, meditation, massage therapy, or acupuncture.  Exercise regularly.  Change situations that cause you stress. Try to keep your work and personal routine reasonable.  Do not abuse illegal drugs.  Limit alcohol intake to no more than 1 drink per day for nonpregnant women and 2 drinks per day for men. One drink equals 12 ounces of beer, 5 ounces of wine, or 1 ounces of hard liquor.  Take a multivitamin, if directed by your health care provider.  Contact a health care provider if:  Your fatigue does not get better.  You have a fever.  You have unintentional weight loss or gain.  You have headaches.  You have difficulty: ? Falling asleep. ? Sleeping throughout the night.  You feel angry, guilty, anxious, or sad.  You are unable to have a bowel movement (constipation).  You skin is dry.  Your legs or another part of your body is swollen. Get help right away if:  You feel confused.  Your vision is blurry.  You feel faint or pass out.  You have a severe headache.  You have severe abdominal, pelvic, or back  pain.  You have chest pain, shortness of breath, or an irregular or fast heartbeat.  You are unable to urinate or you urinate less than normal.  You develop abnormal bleeding, such as bleeding from the rectum, vagina, nose, lungs, or nipples.  You vomit blood.  You have thoughts about harming yourself or committing suicide.  You are worried that you might harm someone else. This information is not intended to replace advice given to you by your health care provider. Make sure you discuss any questions you have with your health care provider. Document Released: 05/31/2007 Document Revised: 01/09/2016 Document Reviewed: 12/05/2013 Elsevier Interactive Patient Education  [employer].

## 2018-02-04 LAB — CBC WITH DIFFERENTIAL/PLATELET
BASOS ABS: 0 10*3/uL (ref 0.0–0.2)
Basos: 0 %
EOS (ABSOLUTE): 0.1 10*3/uL (ref 0.0–0.4)
Eos: 1 %
Hematocrit: 34.8 % (ref 34.0–46.6)
Hemoglobin: 11.1 g/dL (ref 11.1–15.9)
Immature Grans (Abs): 0 10*3/uL (ref 0.0–0.1)
Immature Granulocytes: 0 %
LYMPHS ABS: 2 10*3/uL (ref 0.7–3.1)
Lymphs: 26 %
MCH: 27.8 pg (ref 26.6–33.0)
MCHC: 31.9 g/dL (ref 31.5–35.7)
MCV: 87 fL (ref 79–97)
MONOCYTES: 6 %
Monocytes Absolute: 0.5 10*3/uL (ref 0.1–0.9)
Neutrophils Absolute: 5 10*3/uL (ref 1.4–7.0)
Neutrophils: 67 %
PLATELETS: 306 10*3/uL (ref 150–450)
RBC: 4 x10E6/uL (ref 3.77–5.28)
RDW: 17.6 % — AB (ref 12.3–15.4)
WBC: 7.6 10*3/uL (ref 3.4–10.8)

## 2018-02-04 LAB — COMPREHENSIVE METABOLIC PANEL
ALBUMIN: 3.8 g/dL (ref 3.5–5.5)
ALT: 8 IU/L (ref 0–32)
AST: 12 IU/L (ref 0–40)
Albumin/Globulin Ratio: 1.5 (ref 1.2–2.2)
Alkaline Phosphatase: 79 IU/L (ref 39–117)
BUN / CREAT RATIO: 13 (ref 9–23)
BUN: 9 mg/dL (ref 6–24)
Bilirubin Total: <0.2 mg/dL (ref 0.0–1.2)
CALCIUM: 8.7 mg/dL (ref 8.7–10.2)
CO2: 22 mmol/L (ref 20–29)
Chloride: 106 mmol/L (ref 96–106)
Creatinine, Ser: 0.72 mg/dL (ref 0.57–1.00)
GFR, EST AFRICAN AMERICAN: 115 mL/min/{1.73_m2} (ref >59)
GFR, EST NON AFRICAN AMERICAN: 100 mL/min/{1.73_m2} (ref >59)
GLOBULIN, TOTAL: 2.5 g/dL (ref 1.5–4.5)
Glucose: 77 mg/dL (ref 65–99)
POTASSIUM: 3.8 mmol/L (ref 3.5–5.2)
SODIUM: 141 mmol/L (ref 134–144)
TOTAL PROTEIN: 6.3 g/dL (ref 6.0–8.5)

## 2018-02-04 LAB — LIPID PANEL
CHOL/HDL RATIO: 3.7 ratio (ref 0.0–4.4)
Cholesterol, Total: 149 mg/dL (ref 100–199)
HDL: 40 mg/dL (ref >39)
LDL Calculated: 98 mg/dL (ref 0–99)
Triglycerides: 56 mg/dL (ref 0–149)
VLDL Cholesterol Cal: 11 mg/dL (ref 5–40)

## 2018-02-04 LAB — TSH: TSH: 0.434 u[IU]/mL — AB (ref 0.450–4.500)

## 2018-02-09 ENCOUNTER — Telehealth (INDEPENDENT_AMBULATORY_CARE_PROVIDER_SITE_OTHER): Payer: Self-pay

## 2018-02-09 ENCOUNTER — Telehealth (INDEPENDENT_AMBULATORY_CARE_PROVIDER_SITE_OTHER): Payer: Self-pay | Admitting: Physician Assistant

## 2018-02-09 NOTE — Telephone Encounter (Signed)
Sorry, we discussed having orthopedics treat/fix cause of her pain. Methocarbamol used because she described muscular cramping. Methocarbamol to be used with muscle spasm.

## 2018-02-09 NOTE — Telephone Encounter (Signed)
-----   Message from Clent Demark, PA-C sent at 02/07/2018  1:16 PM EDT ----- Labs normal except for slightly suppressed TSH. Please have thyroid panel done at f/u visit.

## 2018-02-09 NOTE — Telephone Encounter (Signed)
Patient called to give an updated phone number. While on the call provided patient with results of TSH being suppressed, thyroid panel at next visit. Also gave patient phone number to [facility]. Their office contacted [facility] because patient could not be reached on any numbers on file. Patient will call ortho to schedule appointment. Patient requesting script for tramadol. Patient stated that PCP was going to prescribe tramadol at last visit. No supporting documentation for that in office note. Please advise. Patient also states that her insurance does not cover methocarbamol, advised patient to contact insurance to inquire on what type of muscle relaxant they will cover and call our office back to inform. Nat Christen, CMA

## 2018-02-23 NOTE — Telephone Encounter (Signed)
Opened encounter to schedule patient an appointment. Appointment was scheduled and patient did come to appointment. Patricia Davila

## 2018-02-24 ENCOUNTER — Ambulatory Visit (INDEPENDENT_AMBULATORY_CARE_PROVIDER_SITE_OTHER): Payer: Medicare Other | Admitting: Orthopaedic Surgery

## 2018-03-04 ENCOUNTER — Ambulatory Visit (INDEPENDENT_AMBULATORY_CARE_PROVIDER_SITE_OTHER): Payer: Medicare Other | Admitting: Physician Assistant

## 2018-03-22 ENCOUNTER — Ambulatory Visit (INDEPENDENT_AMBULATORY_CARE_PROVIDER_SITE_OTHER): Payer: Medicare Other | Admitting: Physician Assistant

## 2018-03-28 ENCOUNTER — Telehealth (INDEPENDENT_AMBULATORY_CARE_PROVIDER_SITE_OTHER): Payer: Self-pay | Admitting: Orthopaedic Surgery

## 2018-03-28 NOTE — Telephone Encounter (Signed)
Message Sent in Error 

## 2018-03-31 ENCOUNTER — Telehealth (INDEPENDENT_AMBULATORY_CARE_PROVIDER_SITE_OTHER): Payer: Self-pay | Admitting: Orthopaedic Surgery

## 2018-03-31 NOTE — Telephone Encounter (Signed)
Message sent in error

## 2018-05-06 ENCOUNTER — Ambulatory Visit (INDEPENDENT_AMBULATORY_CARE_PROVIDER_SITE_OTHER): Payer: Medicare Other | Admitting: Physician Assistant

## 2018-05-19 ENCOUNTER — Encounter (INDEPENDENT_AMBULATORY_CARE_PROVIDER_SITE_OTHER): Payer: Self-pay | Admitting: Physician Assistant

## 2018-05-19 ENCOUNTER — Ambulatory Visit (INDEPENDENT_AMBULATORY_CARE_PROVIDER_SITE_OTHER): Payer: Medicare Other | Admitting: Physician Assistant

## 2018-05-19 ENCOUNTER — Other Ambulatory Visit: Payer: Self-pay

## 2018-05-19 VITALS — BP 142/97 | HR 61 | Temp 98.0°F | Ht 61.0 in | Wt 194.4 lb

## 2018-05-19 DIAGNOSIS — M25552 Pain in left hip: Secondary | ICD-10-CM

## 2018-05-19 DIAGNOSIS — R7989 Other specified abnormal findings of blood chemistry: Secondary | ICD-10-CM

## 2018-05-19 DIAGNOSIS — R5383 Other fatigue: Secondary | ICD-10-CM | POA: Diagnosis not present

## 2018-05-19 DIAGNOSIS — Z760 Encounter for issue of repeat prescription: Secondary | ICD-10-CM

## 2018-05-19 DIAGNOSIS — M25551 Pain in right hip: Secondary | ICD-10-CM

## 2018-05-19 DIAGNOSIS — Z1239 Encounter for other screening for malignant neoplasm of breast: Secondary | ICD-10-CM

## 2018-05-19 DIAGNOSIS — I10 Essential (primary) hypertension: Secondary | ICD-10-CM | POA: Diagnosis not present

## 2018-05-19 DIAGNOSIS — Z23 Encounter for immunization: Secondary | ICD-10-CM | POA: Diagnosis not present

## 2018-05-19 DIAGNOSIS — Z124 Encounter for screening for malignant neoplasm of cervix: Secondary | ICD-10-CM

## 2018-05-19 MED ORDER — VITAMIN D 50 MCG (2000 UT) PO TABS: 2000.0000 [IU] | ORAL_TABLET | Freq: Every day | ORAL | 2 refills | Status: DC

## 2018-05-19 MED ORDER — LOSARTAN POTASSIUM 50 MG PO TABS: 50.0000 mg | ORAL_TABLET | Freq: Every day | ORAL | 1 refills | Status: DC

## 2018-05-19 MED ORDER — OMEPRAZOLE 40 MG PO CPDR: 40.0000 mg | DELAYED_RELEASE_CAPSULE | Freq: Every day | ORAL | 3 refills | Status: DC

## 2018-05-19 MED ORDER — SUMATRIPTAN SUCCINATE 25 MG PO TABS: ORAL_TABLET | ORAL | 0 refills | Status: DC

## 2018-05-19 MED ORDER — HYDROCHLOROTHIAZIDE 25 MG PO TABS: 25.0000 mg | ORAL_TABLET | Freq: Every day | ORAL | 1 refills | Status: DC

## 2018-05-19 NOTE — Progress Notes (Signed)
Subjective:  Patient ID: Patricia Davila, female    DOB: [birth year]  Age: 47 y.o. MRN: [id number]  CC: thyroid check and med refill  HPI Patricia Davila a 47 y.o.femalewith a PMH of asthma, HTN, bilateral hip pain, GERD, PNA, bipolar disorder, anxiety, and depression presents to f/u on abnormal TSH from 02/03/18. TSH 0.434 uIU/mL at last visit. States she is feeling very fatigued. Does not endorse any bleeding concerns, depression, or symptoms of illness.    Here to f/u on HTN. Last BP 127/84 mmHg in June. BP 142/97 mmHg today. Says she has not taken her medications this morning but asserts that she does take her medications as directed.     Main concern today is of right breast soreness on the lateral aspect of the breast. Thinks she may also feel a lump. Does not endorse skin texture changes, erythema, or nipple discharge.     Requests GYN referral for a female to perform a pelvic exam. No vaginal or pelvic concerns, just being proactive about checking for cervical cancer.     Lastly, request Tramadol for bilateral hip pain. Had bilateral anterior total hip arthroplasty done on 03/25/18. She reports feeling much better since the operation and now able to walk without assistance. However, there is still some occasional pain.        Outpatient Medications Prior to Visit  Medication Sig Dispense Refill  . hydrochlorothiazide (HYDRODIURIL) 25 MG tablet Take 1 tablet (25 mg total) by mouth daily. Take on tablet in the morning. 90 tablet 1  . losartan (COZAAR) 50 MG tablet Take 1 tablet (50 mg total) by mouth daily. 90 tablet 1  . methocarbamol (ROBAXIN) 500 MG tablet Take 1 tablet (500 mg total) by mouth every 6 (six) hours as needed for muscle spasms. 30 tablet 2  . omeprazole (PRILOSEC) 40 MG capsule take 1 capsule by mouth once daily 30 capsule 3  . SUMAtriptan (IMITREX) 25 MG tablet TAKE 1 TABLET DAILY. MAY TAKE 1 MORE TABLET 2 HRS AFTER 1ST. NO MORE THAN 2 TABLETS PER DAY 30 tablet 0  .  nicotine (NICODERM CQ - DOSED IN MG/24 HOURS) 21 mg/24hr patch Place 21 mg onto the skin daily.     No facility-administered medications prior to visit.      ROS Review of Systems  Constitutional: Positive for malaise/fatigue. Negative for chills and fever.  Eyes: Negative for blurred vision.  Respiratory: Negative for shortness of breath.   Cardiovascular: Negative for chest pain and palpitations.  Gastrointestinal: Negative for abdominal pain and nausea.  Genitourinary: Negative for dysuria and hematuria.  Musculoskeletal: Positive for joint pain. Negative for myalgias.  Skin: Negative for rash.  Neurological: Negative for tingling and headaches.  Psychiatric/Behavioral: Negative for depression. The patient is not nervous/anxious.     Objective:  BP (!) 142/97 (BP Location: Left Arm, Patient Position: Sitting, Cuff Size: Large)   Pulse 61   Temp 98 F (36.7 C) (Oral)   Ht 5\' 1"  (1.549 m)   Wt 194 lb 6.4 oz (88.2 kg)   LMP 05/16/2018 (Exact Date)   SpO2 99%   BMI 36.73 kg/m   BP/Weight 05/19/2018 02/03/2018 8/54/6270  Systolic BP 350 093 818  Diastolic BP 97 84 70  Wt. (Lbs) 194.4 193 -  BMI 36.73 36.47 -      Physical Exam  Constitutional: She is oriented to person, place, and time.  Well developed, well nourished, NAD, polite  HENT:  Head: Normocephalic and atraumatic.  Eyes: No scleral icterus.  Neck: Normal range of motion. Neck supple. No thyromegaly present.  Cardiovascular: Normal rate, regular rhythm and normal heart sounds.  Pulmonary/Chest: Effort normal and breath sounds normal.  Abdominal: Soft. Bowel sounds are normal. There is no tenderness.  Musculoskeletal: She exhibits no edema.  Neurological: She is alert and oriented to person, place, and time.  Skin: Skin is warm and dry. No rash noted. No erythema. No pallor.  Right breast exam without appreciable nodule felt. No nipple discharge. No skin texture changes.   Psychiatric: She has a normal mood  and affect. Her behavior is normal. Thought content normal.  Vitals reviewed.    Assessment & Plan:    1. Hypertension, unspecified type - CBC with Differential - Refill losartan (COZAAR) 50 MG tablet; Take 1 tablet (50 mg total) by mouth daily.  Dispense: 90 tablet; Refill: 1 - Refill hydrochlorothiazide (HYDRODIURIL) 25 MG tablet; Take 1 tablet (25 mg total) by mouth daily. Take on tablet in the morning.  Dispense: 90 tablet; Refill: 1 - Basic Metabolic Panel  2. Fatigue, unspecified type - Thyroid panel - CBC - BMP  3. Abnormal TSH - Thyroid Panel With TSH  4. Hip pain, bilateral - Pt is s/p bilateral hip replacement since 03/25/18. I have declined request for Tramadol and advised to take Aleve 220 mg, two tablets po BID with food.  5. Screening for cervical cancer - Ambulatory referral to Gynecology  6. Screening for breast cancer - MM DIGITAL SCREENING BILATERAL; Future  7. Medication refill - Cholecalciferol (VITAMIN D) 2000 units tablet; Take 1 tablet (2,000 Units total) by mouth daily.  Dispense: 30 tablet; Refill: 2 - SUMAtriptan (IMITREX) 25 MG tablet; TAKE 1 TABLET DAILY. MAY TAKE 1 MORE TABLET 2 HRS AFTER 1ST. NO MORE THAN 2 TABLETS PER DAY  Dispense: 10 tablet; Refill: 0 - omeprazole (PRILOSEC) 40 MG capsule; Take 1 capsule (40 mg total) by mouth daily.  Dispense: 30 capsule; Refill: 3  8. Need for prophylactic vaccination and inoculation against influenza - Flu Vaccine QUAD 6+ mos PF IM (Fluarix Quad PF)   Meds ordered this encounter  Medications  . Cholecalciferol (VITAMIN D) 2000 units tablet    Sig: Take 1 tablet (2,000 Units total) by mouth daily.    Dispense:  30 tablet    Refill:  2    Order Specific Question:   Supervising Provider    Answer:   Charlott Rakes [[id number]]  . SUMAtriptan (IMITREX) 25 MG tablet    Sig: TAKE 1 TABLET DAILY. MAY TAKE 1 MORE TABLET 2 HRS AFTER 1ST. NO MORE THAN 2 TABLETS PER DAY    Dispense:  10 tablet    Refill:  0     Order Specific Question:   Supervising Provider    Answer:   Charlott Rakes [[id number]]  . omeprazole (PRILOSEC) 40 MG capsule    Sig: Take 1 capsule (40 mg total) by mouth daily.    Dispense:  30 capsule    Refill:  3    Order Specific Question:   Supervising Provider    Answer:   Charlott Rakes [[id number]]  . losartan (COZAAR) 50 MG tablet    Sig: Take 1 tablet (50 mg total) by mouth daily.    Dispense:  90 tablet    Refill:  1    Order Specific Question:   Supervising Provider    Answer:   Charlott Rakes [[id number]]  . hydrochlorothiazide (HYDRODIURIL) 25 MG tablet    Sig:  Take 1 tablet (25 mg total) by mouth daily. Take on tablet in the morning.    Dispense:  90 tablet    Refill:  1    Order Specific Question:   Supervising Provider    Answer:   Charlott Rakes [[id number]]    Follow-up: Return in about 3 months (around 08/19/2018) for Lipid, HTN.   Clent Demark PA

## 2018-05-19 NOTE — Patient Instructions (Signed)

## 2018-05-20 ENCOUNTER — Telehealth (INDEPENDENT_AMBULATORY_CARE_PROVIDER_SITE_OTHER): Payer: Self-pay

## 2018-05-20 LAB — BASIC METABOLIC PANEL
BUN/Creatinine Ratio: 14 (ref 9–23)
BUN: 12 mg/dL (ref 6–24)
CALCIUM: 9.1 mg/dL (ref 8.7–10.2)
CO2: 23 mmol/L (ref 20–29)
Chloride: 103 mmol/L (ref 96–106)
Creatinine, Ser: 0.85 mg/dL (ref 0.57–1.00)
GFR, EST AFRICAN AMERICAN: 94 mL/min/{1.73_m2} (ref >59)
GFR, EST NON AFRICAN AMERICAN: 82 mL/min/{1.73_m2} (ref >59)
Glucose: 93 mg/dL (ref 65–99)
POTASSIUM: 4.1 mmol/L (ref 3.5–5.2)
SODIUM: 139 mmol/L (ref 134–144)

## 2018-05-20 LAB — CBC WITH DIFFERENTIAL/PLATELET
Basophils Absolute: 0 10*3/uL (ref 0.0–0.2)
Basos: 1 %
EOS (ABSOLUTE): 0.2 10*3/uL (ref 0.0–0.4)
EOS: 3 %
HEMATOCRIT: 35.2 % (ref 34.0–46.6)
HEMOGLOBIN: 11.7 g/dL (ref 11.1–15.9)
IMMATURE GRANULOCYTES: 0 %
Immature Grans (Abs): 0 10*3/uL (ref 0.0–0.1)
Lymphocytes Absolute: 1.6 10*3/uL (ref 0.7–3.1)
Lymphs: 22 %
MCH: 28.3 pg (ref 26.6–33.0)
MCHC: 33.2 g/dL (ref 31.5–35.7)
MCV: 85 fL (ref 79–97)
Monocytes Absolute: 0.7 10*3/uL (ref 0.1–0.9)
Monocytes: 9 %
NEUTROS PCT: 65 %
Neutrophils Absolute: 4.9 10*3/uL (ref 1.4–7.0)
Platelets: 322 10*3/uL (ref 150–450)
RBC: 4.13 x10E6/uL (ref 3.77–5.28)
RDW: 17.4 % — ABNORMAL HIGH (ref 12.3–15.4)
WBC: 7.5 10*3/uL (ref 3.4–10.8)

## 2018-05-20 LAB — THYROID PANEL WITH TSH
Free Thyroxine Index: 2.6 (ref 1.2–4.9)
T3 Uptake Ratio: 30 % (ref 24–39)
T4 TOTAL: 8.5 ug/dL (ref 4.5–12.0)
TSH: 0.524 u[IU]/mL (ref 0.450–4.500)

## 2018-05-20 NOTE — Telephone Encounter (Signed)
Left message notifying patient that results are normal. Arael Piccione Latrelle Dodrill, CMA

## 2018-05-20 NOTE — Telephone Encounter (Signed)
-----   Message from Clent Demark, PA-C sent at 05/20/2018  8:39 AM EDT ----- Labs normal.

## 2018-06-27 ENCOUNTER — Telehealth: Payer: Self-pay | Admitting: General Practice

## 2018-06-27 NOTE — Telephone Encounter (Signed)
Patient notified of appointment scheduled for 08/05/18 at 9:30am with Carrolyn Leigh, CNM.  Patient verbalized understanding.

## 2018-06-28 ENCOUNTER — Ambulatory Visit: Payer: Medicare Other

## 2018-07-19 ENCOUNTER — Ambulatory Visit
Admission: RE | Admit: 2018-07-19 | Discharge: 2018-07-19 | Disposition: A | Discharge status: Home / Self Care | Payer: Medicare Other | Source: Ambulatory Visit | Attending: Physician Assistant | Admitting: Physician Assistant

## 2018-07-19 DIAGNOSIS — Z1239 Encounter for other screening for malignant neoplasm of breast: Secondary | ICD-10-CM

## 2018-07-19 DIAGNOSIS — Z1231 Encounter for screening mammogram for malignant neoplasm of breast: Secondary | ICD-10-CM | POA: Diagnosis not present

## 2018-07-23 IMAGING — DX DG HIP (WITH OR WITHOUT PELVIS) 3-4V BILAT
5 series · 5 of 5 positions shown · non-contrast
Comparison: MRI 01/24/2014. Lumbar spine 12/28/2013. CT abdomen
04/21/2013 .

CLINICAL DATA: Pain.  No injury.

EXAM:
DG HIP (WITH OR WITHOUT PELVIS) 3-4V BILAT

[pelvis ap (1 of 2)]
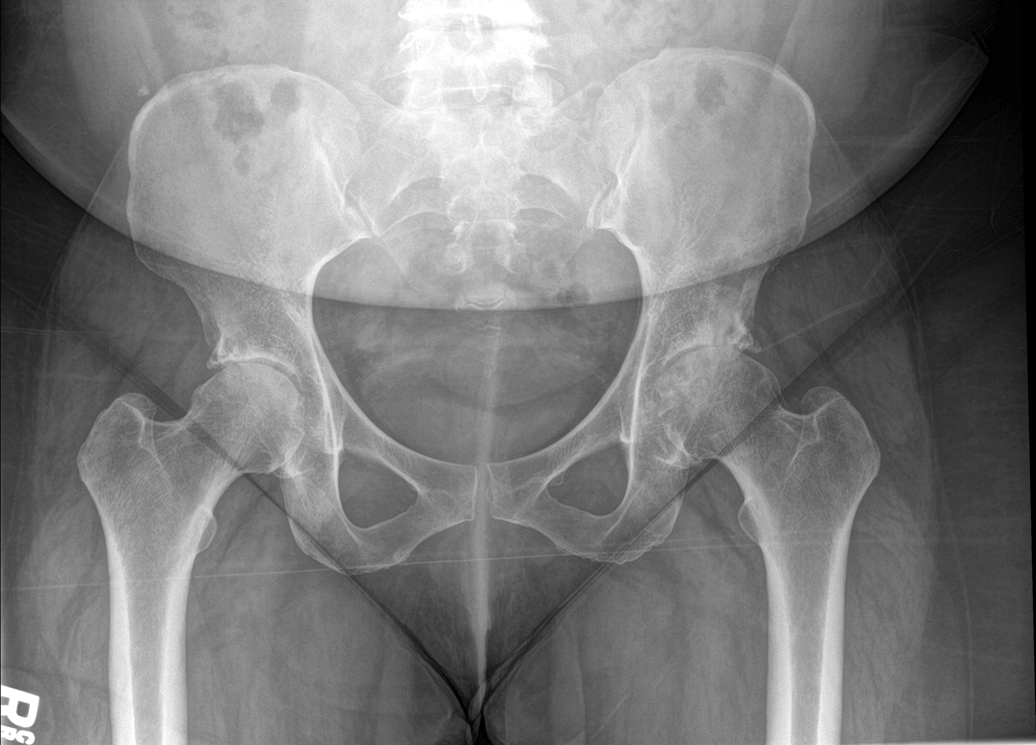

[hip ap]
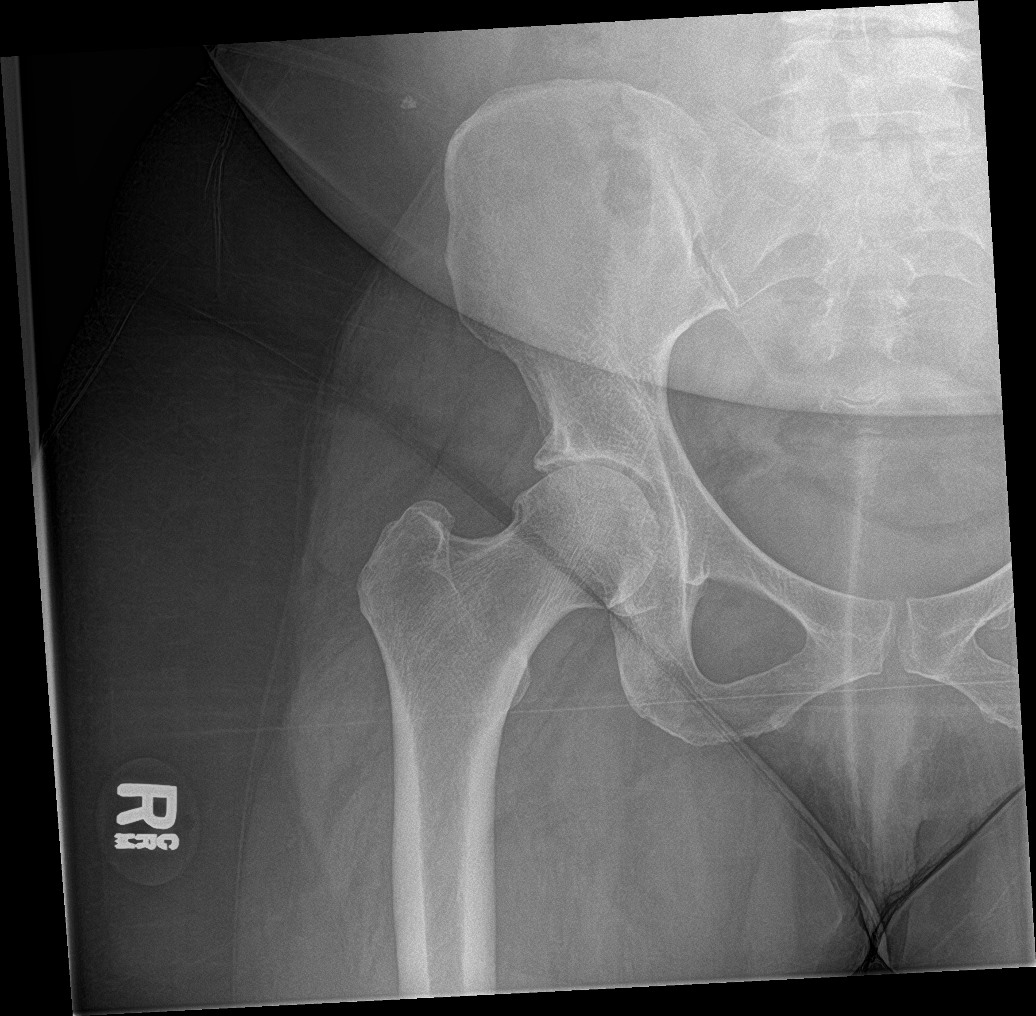

[hip lat (1 of 2)]
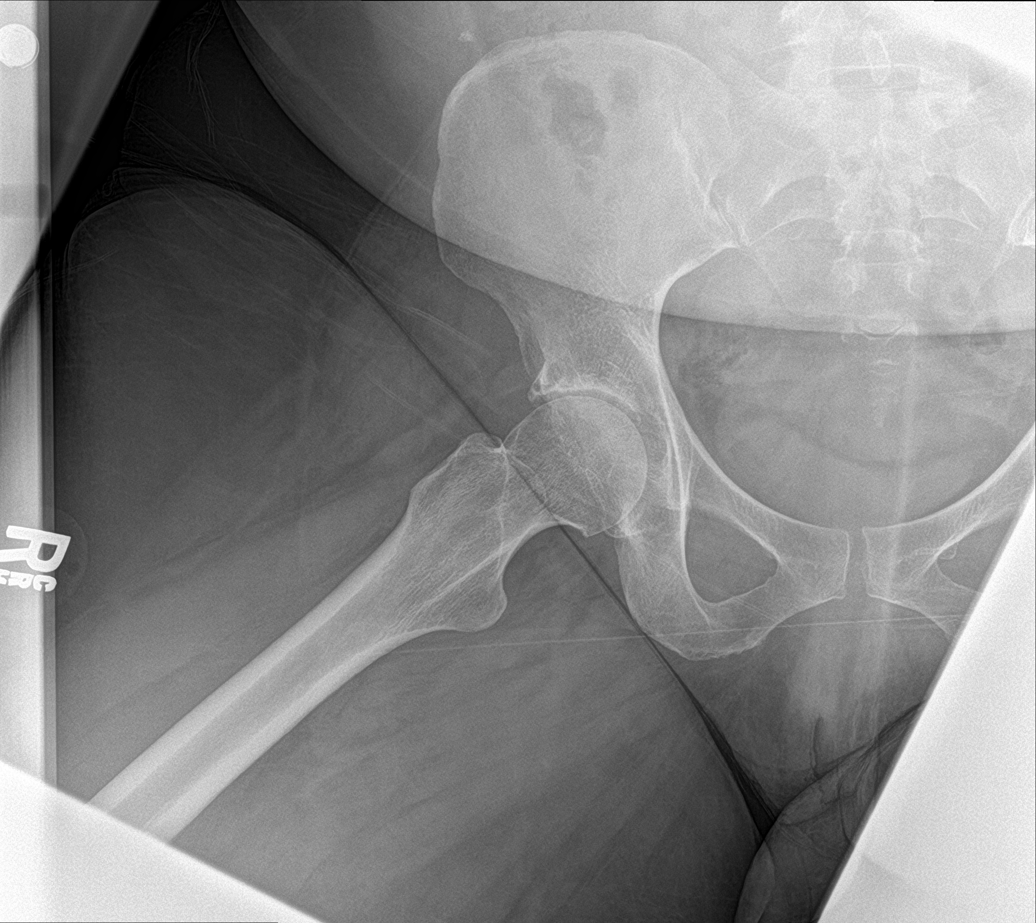

[hip lat (2 of 2)]
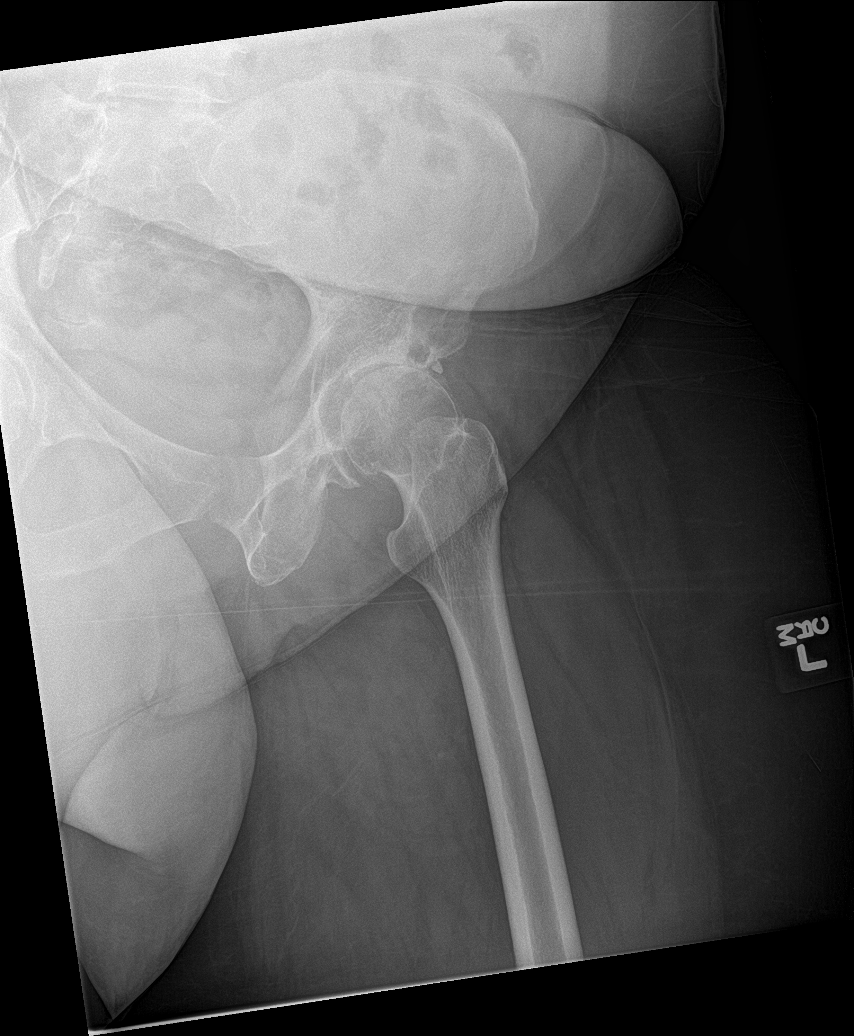

[pelvis ap (2 of 2)]
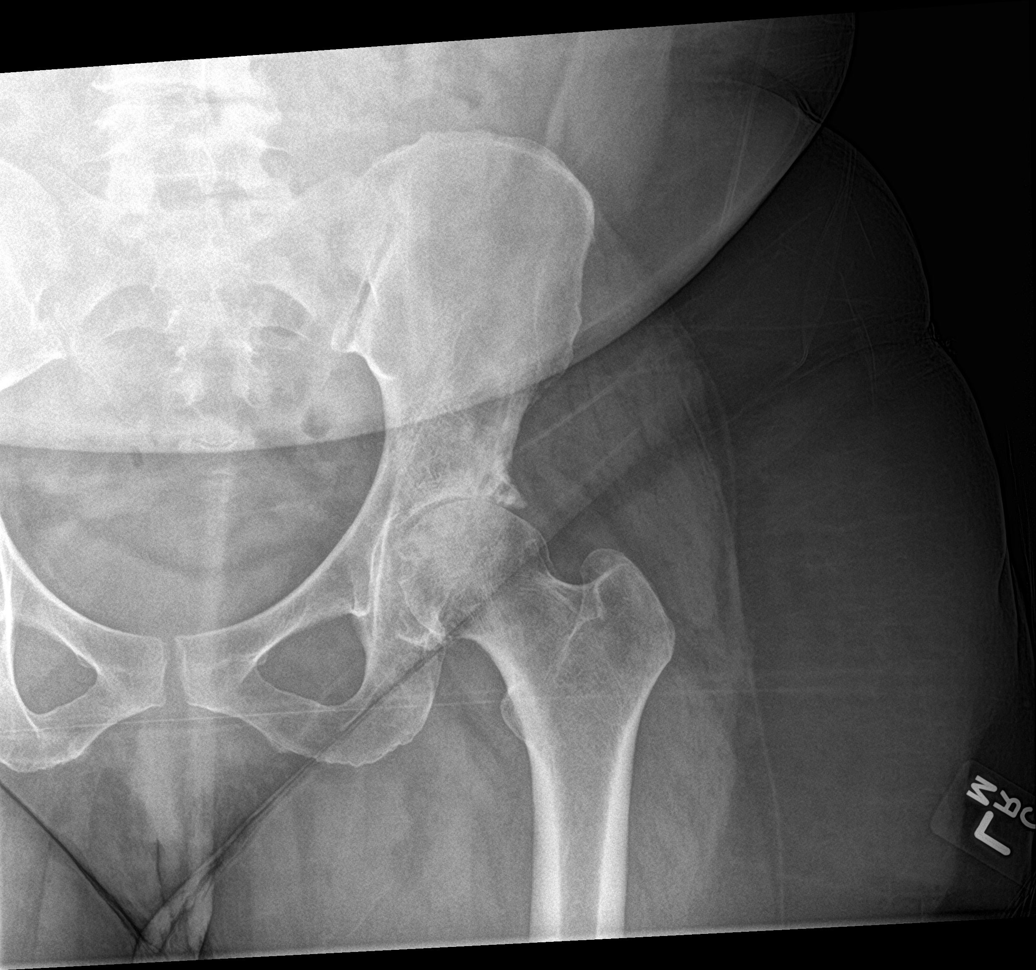

[5 of 5 positions shown; findings below may reference images not displayed]

FINDINGS: Degenerative changes noted of the lumbar spine and both hips. Slight
increased density noted over both femoral heads. Avascular necrosis
cannot be excluded. MRI of the hips can be obtained to further
evaluate . No evidence of fracture or dislocation.
IMPRESSION: 1. Degenerative changes noted of the lumbar spine and both hips. No
acute bony abnormality identified.

2. Slight increased density noted over both femoral heads. Although
these changes may be degenerative avascular necrosis cannot be
excluded. MRI of both hips can be obtained to further evaluate as
needed .

## 2018-08-05 ENCOUNTER — Encounter: Payer: Medicare Other | Admitting: Family

## 2018-08-05 ENCOUNTER — Encounter: Payer: Self-pay | Admitting: General Practice

## 2018-08-19 ENCOUNTER — Ambulatory Visit (INDEPENDENT_AMBULATORY_CARE_PROVIDER_SITE_OTHER): Payer: Medicare Other | Admitting: Physician Assistant

## 2018-10-04 ENCOUNTER — Telehealth (INDEPENDENT_AMBULATORY_CARE_PROVIDER_SITE_OTHER): Payer: Self-pay | Admitting: Physician Assistant

## 2018-10-04 NOTE — Telephone Encounter (Signed)
Patient called to request med refill for hydrochlorothiazide (HYDRODIURIL) 25 MG tablet  States that they went out of town and lost her medication. She has tried to request a refill thru her pharmacy but the pharmacist told her it was to early to request a refill. Patient states she is having headaches and BP is high.  Patient uses [facility], [address] AT [place]  Please advice [phone]  Thank you Emmit Pomfret

## 2018-10-14 NOTE — Telephone Encounter (Signed)
Patient is not eligible for refill unitl march 6 per pharmacy. She has scheduled OV for 3/17 for HTN and lipids. She will call pharmacy on or around march 6 to have them send refill request by fax or electronically. Nat Christen, CMA

## 2018-11-01 ENCOUNTER — Ambulatory Visit (INDEPENDENT_AMBULATORY_CARE_PROVIDER_SITE_OTHER): Payer: Medicare Other | Admitting: Primary Care

## 2018-11-11 ENCOUNTER — Ambulatory Visit (INDEPENDENT_AMBULATORY_CARE_PROVIDER_SITE_OTHER): Payer: Medicare Other | Admitting: Primary Care

## 2019-02-20 ENCOUNTER — Ambulatory Visit (INDEPENDENT_AMBULATORY_CARE_PROVIDER_SITE_OTHER): Payer: Medicare Other | Admitting: Primary Care

## 2019-03-04 ENCOUNTER — Other Ambulatory Visit: Payer: Self-pay

## 2019-03-04 ENCOUNTER — Emergency Department (HOSPITAL_COMMUNITY): Payer: Medicare Other

## 2019-03-04 ENCOUNTER — Encounter (HOSPITAL_COMMUNITY): Payer: Self-pay | Admitting: Emergency Medicine

## 2019-03-04 ENCOUNTER — Emergency Department (HOSPITAL_COMMUNITY)
Admission: EM | Admit: 2019-03-04 | Discharge: 2019-03-04 | Disposition: A | Discharge status: Home / Self Care | Payer: Medicare Other | Attending: Emergency Medicine | Admitting: Emergency Medicine

## 2019-03-04 DIAGNOSIS — X500XXA Overexertion from strenuous movement or load, initial encounter: Secondary | ICD-10-CM | POA: Insufficient documentation

## 2019-03-04 DIAGNOSIS — F17210 Nicotine dependence, cigarettes, uncomplicated: Secondary | ICD-10-CM | POA: Insufficient documentation

## 2019-03-04 DIAGNOSIS — Y999 Unspecified external cause status: Secondary | ICD-10-CM | POA: Diagnosis not present

## 2019-03-04 DIAGNOSIS — Y9389 Activity, other specified: Secondary | ICD-10-CM | POA: Insufficient documentation

## 2019-03-04 DIAGNOSIS — Z96643 Presence of artificial hip joint, bilateral: Secondary | ICD-10-CM | POA: Insufficient documentation

## 2019-03-04 DIAGNOSIS — Y929 Unspecified place or not applicable: Secondary | ICD-10-CM | POA: Insufficient documentation

## 2019-03-04 DIAGNOSIS — S82852A Displaced trimalleolar fracture of left lower leg, initial encounter for closed fracture: Secondary | ICD-10-CM | POA: Diagnosis not present

## 2019-03-04 DIAGNOSIS — Z79899 Other long term (current) drug therapy: Secondary | ICD-10-CM | POA: Diagnosis not present

## 2019-03-04 DIAGNOSIS — J45909 Unspecified asthma, uncomplicated: Secondary | ICD-10-CM | POA: Insufficient documentation

## 2019-03-04 DIAGNOSIS — I10 Essential (primary) hypertension: Secondary | ICD-10-CM | POA: Insufficient documentation

## 2019-03-04 DIAGNOSIS — S99912A Unspecified injury of left ankle, initial encounter: Secondary | ICD-10-CM | POA: Diagnosis present

## 2019-03-04 MED ORDER — HYDROCODONE-ACETAMINOPHEN 5-325 MG PO TABS
2.0000 | ORAL_TABLET | Freq: Once | ORAL | Status: AC
  Administered 2019-03-04: 2 via ORAL
  Filled 2019-03-04: qty 2

## 2019-03-04 MED ORDER — HYDROCODONE-ACETAMINOPHEN 5-325 MG PO TABS: 1.0000 | ORAL_TABLET | Freq: Four times a day (QID) | ORAL | 0 refills | Status: DC | PRN

## 2019-03-04 NOTE — ED Provider Notes (Signed)
[facility] EMERGENCY DEPARTMENT Provider Note   CSN: [id number] Arrival date & time: 03/04/19  5732     History   Chief Complaint Chief Complaint  Patient presents with  . Foot Injury    HPI Patricia Davila is a 48 y.o. female.  She is complaining of severe left ankle pain after twisting it when she got up to go to the bathroom and stepped on a child's toy.  She said there is moderate pain at rest throbbing and becomes sharp and stabbing and severe when she tries to put any weight on it.  No numbness.  No open wounds.  She denies any other injury from the incident.  No head or neck or back pain no abdominal pain no fevers or cough.     The history is provided by the patient.  Foot Injury Location:  Ankle Injury: yes   Mechanism of injury: fall   Fall:    Fall occurred:  Walking Ankle location:  L ankle Pain details:    Quality:  Throbbing and sharp   Radiates to:  Does not radiate   Severity:  Severe   Onset quality:  Sudden   Timing:  Constant   Progression:  Unchanged Chronicity:  New Dislocation: no   Relieved by:  Nothing Worsened by:  Bearing weight Ineffective treatments:  Rest Associated symptoms: decreased ROM and swelling   Associated symptoms: no back pain, no fever, no neck pain, no numbness and no tingling     Past Medical History:  Diagnosis Date  . Anemia    low iron during pregnancy  . Anxiety   . Arthritis   . Asthma    as a child  . Bipolar disorder ([place])   . Depression   . GERD (gastroesophageal reflux disease)   . Headache   . Hypertension   . Pneumonia    as a child  . PONV (postoperative nausea and vomiting)   . Restless legs     Patient Active Problem List   Diagnosis Date Noted  . Primary osteoarthritis of right hip 04/08/2017  . History of hip replacement 03/25/2017  . Primary osteoarthritis of left hip 12/22/2016  . Major depressive disorder 10/24/2012  . Smokes tobacco daily 05/13/2010  . Headache(784.0)  05/13/2010  . Microalbuminuria 04/16/2009  . GERD 11/22/2008  . Left hip pain 11/22/2008  . Bipolar 1 disorder, depressed ([place]) 05/17/2007  . Lumbosacral degenerative disk disease 03/23/2005  . Essential hypertension, benign 08/18/2003    Past Surgical History:  Procedure Laterality Date  . BILATERAL ANTERIOR TOTAL HIP ARTHROPLASTY Bilateral 03/25/2017   Procedure: BILATERAL ANTERIOR TOTAL HIP ARTHROPLASTY;  Surgeon: Leandrew Koyanagi, MD;  Location: [facility];  Service: Orthopedics;  Laterality: Bilateral;  . DENTAL SURGERY     all teeth extracted  . LAMINECTOMY AND MICRODISCECTOMY LUMBAR SPINE   06/27/2010  . TUBAL LIGATION Bilateral      OB History   No obstetric history on file.      Home Medications    Prior to Admission medications   Medication Sig Start Date End Date Taking? Authorizing Provider  Cholecalciferol (VITAMIN D) 2000 units tablet Take 1 tablet (2,000 Units total) by mouth daily. 05/19/18   Clent Demark, PA-C  hydrochlorothiazide (HYDRODIURIL) 25 MG tablet Take 1 tablet (25 mg total) by mouth daily. Take on tablet in the morning. 05/19/18   Clent Demark, PA-C  losartan (COZAAR) 50 MG tablet Take 1 tablet (50 mg total) by mouth  daily. 05/19/18   Clent Demark, PA-C  omeprazole (PRILOSEC) 40 MG capsule Take 1 capsule (40 mg total) by mouth daily. 05/19/18   Clent Demark, PA-C  SUMAtriptan (IMITREX) 25 MG tablet TAKE 1 TABLET DAILY. MAY TAKE 1 MORE TABLET 2 HRS AFTER 1ST. NO MORE THAN 2 TABLETS PER DAY 05/19/18   Clent Demark, PA-C    Family History Family History  Problem Relation Age of Onset  . Colon cancer Mother   . HIV Mother   . Colon cancer Sister   . Breast cancer Neg Hx     Social History Social History   Tobacco Use  . Smoking status: Current Every Day Smoker    Packs/day: 0.25    Types: Cigarettes  . Smokeless tobacco: Never Used  Substance Use Topics  . Alcohol use: No  . Drug use: No     Allergies   Ativan  [lorazepam] and Tylenol with codeine #3 [acetaminophen-codeine]   Review of Systems Review of Systems  Constitutional: Negative for fever.  HENT: Negative for sore throat.   Eyes: Negative for visual disturbance.  Respiratory: Negative for shortness of breath.   Cardiovascular: Negative for chest pain.  Gastrointestinal: Negative for abdominal pain.  Genitourinary: Negative for dysuria.  Musculoskeletal: Negative for back pain and neck pain.  Skin: Negative for rash.  Neurological: Negative for headaches.     Physical Exam Updated Vital Signs BP (!) 152/80 (BP Location: Left Arm)   Pulse 73   Temp 98 F (36.7 C) (Oral)   Resp (!) 21   LMP 02/22/2019   SpO2 98%   Physical Exam Vitals signs and nursing note reviewed.  Constitutional:      General: She is not in acute distress.    Appearance: She is well-developed.  HENT:     Head: Normocephalic and atraumatic.  Eyes:     Conjunctiva/sclera: Conjunctivae normal.  Neck:     Musculoskeletal: Neck supple.  Cardiovascular:     Rate and Rhythm: Normal rate and regular rhythm.     Heart sounds: No murmur.  Pulmonary:     Effort: Pulmonary effort is normal. No respiratory distress.     Breath sounds: Normal breath sounds.  Abdominal:     Palpations: Abdomen is soft.     Tenderness: There is no abdominal tenderness.  Musculoskeletal:        General: Swelling, tenderness and signs of injury present.     Comments: She is nontender left hip and knee.  There is no proximal lower leg tenderness.  Distal neurovascular intact and no midfoot or toe tenderness.  Distal pulses intact.  She has moderate swelling both medial and lateral malleoli with tenderness.  Skin:    General: Skin is warm and dry.     Capillary Refill: Capillary refill takes less than 2 seconds.  Neurological:     General: No focal deficit present.     Mental Status: She is alert and oriented to person, place, and time.     Sensory: No sensory deficit.      Motor: No weakness.      ED Treatments / Results  Labs (all labs ordered are listed, but only abnormal results are displayed) Labs Reviewed - No data to display  EKG None  Radiology Dg Tibia/fibula Left  Result Date: 03/04/2019 CLINICAL DATA:  Fall, deformity to LEFT ankle. EXAM: LEFT TIBIA AND FIBULA - 2 VIEW COMPARISON:  None. FINDINGS: Displaced fractures of the medial malleolus, distal fibula and  posterior malleolus. Anterior displacement of the distal tibia relative to the underlying talar dome. Proximal tibia and fibula are intact and normally aligned. IMPRESSION: 1. Trimalleolar displaced fractures. Anterior displacement of the distal tibia relative to the underlying talar dome. 2. Proximal tibia and fibula are intact and normally aligned. Electronically Signed   By: Franki Cabot M.D.   On: 03/04/2019 08:20   Dg Ankle Complete Left  Result Date: 03/04/2019 CLINICAL DATA:  Fall, deformity to LEFT ankle. EXAM: LEFT ANKLE COMPLETE - 3+ VIEW COMPARISON:  None. FINDINGS: Displaced fractures of the medial malleolus, distal fibula and posterior malleolus. Asymmetry of the ankle mortise with medial widening. No fracture seen at the talar dome. Visualized portions of the hindfoot and midfoot appear intact and normally aligned. IMPRESSION: 1. Trimalleolar displaced fractures. Anterior displacement of the distal tibia relative to the talar dome. 2. Associated asymmetry of the ankle mortise with medial widening. Electronically Signed   By: Franki Cabot M.D.   On: 03/04/2019 08:18    Procedures .Ortho Injury Treatment  Date/Time: 03/04/2019 8:59 AM Performed by: Hayden Rasmussen, MD Authorized by: Hayden Rasmussen, MD   Consent:    Consent obtained:  Verbal   Consent given by:  Patient   Risks discussed:  Fracture, restricted joint movement, vascular damage and nerve damage   Alternatives discussed:  No treatment, referral and delayed treatmentInjury location: ankle Location details:  left ankle Injury type: fracture Fracture type: trimalleolar Pre-procedure neurovascular assessment: neurovascularly intact Pre-procedure distal perfusion: normal Pre-procedure neurological function: normal Pre-procedure range of motion: reduced  Anesthesia: Local anesthesia used: no  Patient sedated: NoManipulation performed: no Immobilization: sling Splint type: short leg Supplies used: Ortho-Glass Post-procedure neurovascular assessment: post-procedure neurovascularly intact Post-procedure distal perfusion: normal Post-procedure neurological function: normal Post-procedure range of motion: unchanged Patient tolerance: patient tolerated the procedure well with no immediate complications    (including critical care time)  Medications Ordered in ED Medications - No data to display   Initial Impression / Assessment and Plan / ED Course  I have reviewed the triage vital signs and the nursing notes.  Pertinent labs & imaging results that were available during my care of the patient were reviewed by me and considered in my medical decision making (see chart for details).  Clinical Course as of Mar 04 540  Sat Mar 03, 6662  306 48 year old female here with left ankle pain after mechanical fall.  Differential includes fracture, dislocation, sprain.  No proximal leg tenderness but will check a tib-fib in case there is a massioneuve fracture.  Distal neurovascular intact.   [MB]  1031 After application of the splint patient is still neurovascular intact.  Have answered her questions and sent a prescription for pain meds to her pharmacy.  Make sure she is safe on her crutches.   [MB]    Clinical Course User Index [MB] Hayden Rasmussen, MD         Final Clinical Impressions(s) / ED Diagnoses   Final diagnoses:  Trimalleolar fracture, left, closed, initial encounter    ED Discharge Orders         Ordered    HYDROcodone-acetaminophen (NORCO/VICODIN) 5-325 MG tablet   Every 6 hours PRN     03/04/19 0906           Hayden Rasmussen, MD 03/05/19 [phone]

## 2019-03-04 NOTE — ED Notes (Signed)
Ortho in route.

## 2019-03-04 NOTE — Progress Notes (Signed)
Orthopedic Tech Progress Note Patient Details:  Patricia Davila 08-17-1971 [id number] Had help from the DR while molding into the 90 degrees  Ortho Devices Type of Ortho Device: Stirrup splint, Post (short) splint, Crutches Splint Material: Fiberglass Ortho Device/Splint Location: LLE Ortho Device/Splint Interventions: Application, Ordered, Adjustment   Post Interventions Patient Tolerated: Well Instructions Provided: Care of device, Adjustment of device   Janit Pagan 03/04/2019, 9:09 AM

## 2019-03-04 NOTE — ED Triage Notes (Signed)
Pt here for eval of left ankle fx. She tripped on toy on the floor in the middle of the night. Deformity and swelling noted.

## 2019-03-04 NOTE — Discharge Instructions (Signed)
You were seen in the emergency department for a left ankle injury from a fall.  You broke your ankle in multiple places and this will need to be follow-up with the orthopedic clinic.  You were in a splint and you should not put any weight on your leg.  You should keep the splint clean and dry.  Crutches for ambulating.  We are prescribing you some pain medication to use as needed.  Please call Dr. Trevor Mace office on Monday for close follow-up.  Return if any numbness or uncontrolled pain.

## 2019-03-06 ENCOUNTER — Other Ambulatory Visit: Payer: Self-pay

## 2019-03-06 ENCOUNTER — Ambulatory Visit (INDEPENDENT_AMBULATORY_CARE_PROVIDER_SITE_OTHER): Payer: Medicare Other | Admitting: Primary Care

## 2019-03-06 ENCOUNTER — Encounter (INDEPENDENT_AMBULATORY_CARE_PROVIDER_SITE_OTHER): Payer: Self-pay | Admitting: Primary Care

## 2019-03-06 VITALS — BP 118/78 | HR 67 | Temp 97.3°F | Ht 61.0 in | Wt 213.6 lb

## 2019-03-06 DIAGNOSIS — E559 Vitamin D deficiency, unspecified: Secondary | ICD-10-CM | POA: Diagnosis not present

## 2019-03-06 DIAGNOSIS — I10 Essential (primary) hypertension: Secondary | ICD-10-CM

## 2019-03-06 DIAGNOSIS — G43101 Migraine with aura, not intractable, with status migrainosus: Secondary | ICD-10-CM | POA: Diagnosis not present

## 2019-03-06 DIAGNOSIS — W19XXXA Unspecified fall, initial encounter: Secondary | ICD-10-CM

## 2019-03-06 DIAGNOSIS — E782 Mixed hyperlipidemia: Secondary | ICD-10-CM

## 2019-03-06 DIAGNOSIS — F17200 Nicotine dependence, unspecified, uncomplicated: Secondary | ICD-10-CM

## 2019-03-06 DIAGNOSIS — Z760 Encounter for issue of repeat prescription: Secondary | ICD-10-CM

## 2019-03-06 DIAGNOSIS — F17210 Nicotine dependence, cigarettes, uncomplicated: Secondary | ICD-10-CM

## 2019-03-06 MED ORDER — LOSARTAN POTASSIUM 50 MG PO TABS: 50.0000 mg | ORAL_TABLET | Freq: Every day | ORAL | 1 refills | Status: DC

## 2019-03-06 MED ORDER — HYDROCHLOROTHIAZIDE 25 MG PO TABS: 25.0000 mg | ORAL_TABLET | Freq: Every day | ORAL | 1 refills | Status: DC

## 2019-03-06 MED ORDER — OMEPRAZOLE 40 MG PO CPDR: 40.0000 mg | DELAYED_RELEASE_CAPSULE | Freq: Every day | ORAL | 1 refills | Status: DC

## 2019-03-06 MED ORDER — VITAMIN D 50 MCG (2000 UT) PO TABS: 2000.0000 [IU] | ORAL_TABLET | Freq: Every day | ORAL | 2 refills | Status: DC

## 2019-03-06 MED ORDER — SUMATRIPTAN SUCCINATE 25 MG PO TABS: ORAL_TABLET | ORAL | 2 refills | Status: DC

## 2019-03-06 NOTE — Progress Notes (Signed)
 Established Patient Office Visit  Subjective:  Patient ID: Patricia Davila, female    DOB: 06/26/1971  Age: 48 y.o. MRN: [id number]  CC:  Chief Complaint  Patient presents with  . Establish Care    hypertension/lipids   . Medication Refill    HPI Patricia Davila presents for management of hyperlipidemia and hypertension-she denies shortness of breath, headaches, chest pain or lower extremity edema and medication refills. PMI: listed below. Requesting medication refills. Seen in the emergency room 03/04/2019 after a fall over grandchildren toys she was diagnosised with Trimalleolar fracture.    Past Medical History:  Diagnosis Date  . Anemia    low iron during pregnancy  . Anxiety   . Arthritis   . Asthma    as a child  . Bipolar disorder (HCC)   . Depression   . GERD (gastroesophageal reflux disease)   . Headache   . Hypertension   . Pneumonia    as a child  . PONV (postoperative nausea and vomiting)   . Restless legs     Past Surgical History:  Procedure Laterality Date  . BILATERAL ANTERIOR TOTAL HIP ARTHROPLASTY Bilateral 03/25/2017   Procedure: BILATERAL ANTERIOR TOTAL HIP ARTHROPLASTY;  Surgeon: Xu, Naiping M, MD;  Location: [facility] OR;  Service: Orthopedics;  Laterality: Bilateral;  . DENTAL SURGERY     all teeth extracted  . LAMINECTOMY AND MICRODISCECTOMY LUMBAR SPINE   06/27/2010  . TUBAL LIGATION Bilateral     Family History  Problem Relation Age of Onset  . Colon cancer Mother   . HIV Mother   . Colon cancer Sister   . Breast cancer Neg Hx     Social History   Socioeconomic History  . Marital status: Married    Spouse name: Not on file  . Number of children: Not on file  . Years of education: Not on file  . Highest education level: Not on file  Occupational History  . Not on file  Social Needs  . Financial resource strain: Not on file  . Food insecurity    Worry: Not on file    Inability: Not on file  . Transportation needs    Medical: Not on file     Non-medical: Not on file  Tobacco Use  . Smoking status: Current Every Day Smoker    Packs/day: 0.25    Types: Cigarettes  . Smokeless tobacco: Never Used  Substance and Sexual Activity  . Alcohol use: No  . Drug use: No  . Sexual activity: Yes    Birth control/protection: None  Lifestyle  . Physical activity    Days per week: Not on file    Minutes per session: Not on file  . Stress: Not on file  Relationships  . Social connections    Talks on phone: Not on file    Gets together: Not on file    Attends religious service: Not on file    Active member of club or organization: Not on file    Attends meetings of clubs or organizations: Not on file    Relationship status: Not on file  . Intimate partner violence    Fear of current or ex partner: Not on file    Emotionally abused: Not on file    Physically abused: Not on file    Forced sexual activity: Not on file  Other Topics Concern  . Not on file  Social History Narrative  . Not on file    Outpatient   Medications Prior to Visit  Medication Sig Dispense Refill  . HYDROcodone-acetaminophen (NORCO/VICODIN) 5-325 MG tablet Take 1-2 tablets by mouth every 6 (six) hours as needed for severe pain. 15 tablet 0  . Cholecalciferol (VITAMIN D) 2000 units tablet Take 1 tablet (2,000 Units total) by mouth daily. 30 tablet 2  . hydrochlorothiazide (HYDRODIURIL) 25 MG tablet Take 1 tablet (25 mg total) by mouth daily. Take on tablet in the morning. 90 tablet 1  . omeprazole (PRILOSEC) 40 MG capsule Take 1 capsule (40 mg total) by mouth daily. 30 capsule 3  . losartan (COZAAR) 50 MG tablet Take 1 tablet (50 mg total) by mouth daily. (Patient not taking: Reported on 03/06/2019) 90 tablet 1  . SUMAtriptan (IMITREX) 25 MG tablet TAKE 1 TABLET DAILY. MAY TAKE 1 MORE TABLET 2 HRS AFTER 1ST. NO MORE THAN 2 TABLETS PER DAY (Patient not taking: Reported on 03/06/2019) 10 tablet 0   No facility-administered medications prior to visit.      Allergies  Allergen Reactions  . Ativan [Lorazepam] Anxiety    HALLUCINATIONS  . Tylenol With Codeine #3 [Acetaminophen-Codeine] Hives and Itching    ROS Review of Systems  Constitutional: Positive for activity change.  Respiratory: Positive for shortness of breath.        With exertion left ankle and foot fracture  Genitourinary: Positive for frequency and urgency.  Musculoskeletal: Positive for gait problem.       Fracture left ankle and foot s/p fall      Objective:    Physical Exam  Constitutional: She is oriented to person, place, and time. She appears well-developed and well-nourished.  HENT:  Head: Normocephalic.  Neck: Neck supple.  Pulmonary/Chest: Breath sounds normal. She is in respiratory distress.  Abdominal: Soft. Bowel sounds are normal. She exhibits distension.  Musculoskeletal:        General: Tenderness, deformity and edema present.     Comments: fracture  Neurological: She is oriented to person, place, and time.  Skin: Skin is warm and dry.  Psychiatric: She has a normal mood and affect.    Wt 213 lb 9.6 oz (96.9 kg)   LMP 02/22/2019   BMI 35.36 kg/m  Wt Readings from Last 3 Encounters:  03/06/19 213 lb 9.6 oz (96.9 kg)  05/19/18 194 lb 6.4 oz (88.2 kg)  02/03/18 193 lb (87.5 kg)     Health Maintenance Due  Topic Date Due  . PAP SMEAR-Modifier  01/30/1992    There are no preventive care reminders to display for this patient.  Lab Results  Component Value Date   TSH 0.524 05/19/2018   Lab Results  Component Value Date   WBC 7.5 05/19/2018   HGB 11.7 05/19/2018   HCT 35.2 05/19/2018   MCV 85 05/19/2018   PLT 322 05/19/2018   Lab Results  Component Value Date   NA 139 05/19/2018   K 4.1 05/19/2018   CO2 23 05/19/2018   GLUCOSE 93 05/19/2018   BUN 12 05/19/2018   CREATININE 0.85 05/19/2018   BILITOT <0.2 02/03/2018   ALKPHOS 79 02/03/2018   AST 12 02/03/2018   ALT 8 02/03/2018   PROT 6.3 02/03/2018   ALBUMIN 3.8  02/03/2018   CALCIUM 9.1 05/19/2018   ANIONGAP 6 03/26/2017   Lab Results  Component Value Date   CHOL 149 02/03/2018   Lab Results  Component Value Date   HDL 40 02/03/2018   Lab Results  Component Value Date   LDLCALC 98 02/03/2018     Lab Results  Component Value Date   TRIG 56 02/03/2018   Lab Results  Component Value Date   CHOLHDL 3.7 02/03/2018   No results found for: HGBA1C    Assessment & Plan:   Patricia Davila was seen today for establish care and medication refill.  Diagnoses and all orders for this visit:  Hypertension, unspecified type Counseled on blood pressure goal of less than 130/80, low-sodium, DASH diet, medication compliance, 150 minutes of moderate intensity exercise per week. Discussed medication compliance, adverse effects. -     CMP14+EGFR -     losartan (COZAAR) 50 MG tablet; Take 1 tablet (50 mg total) by mouth daily. -     hydrochlorothiazide (HYDRODIURIL) 25 MG tablet; Take 1 tablet (25 mg total) by mouth daily. Take on tablet in the morning.  Mixed hyperlipidemia  Healthy lifestyle diet of fruits vegetables fish nuts whole grains and low saturated fat . Foods high in cholesterol or liver, fatty meats,cheese, butter avocados, nuts and seeds, chocolate and fried foods. -     Lipid Panel  Medication refill -     omeprazole (PRILOSEC) 40 MG capsule; Take 1 capsule (40 mg total) by mouth daily. -     Cholecalciferol (VITAMIN D) 50 MCG (2000 UT) tablet; Take 1 tablet (2,000 Units total) by mouth daily. -     SUMAtriptan (IMITREX) 25 MG tablet; TAKE 1 TABLET DAILY. MAY TAKE 1 MORE TABLET 2 HRS AFTER 1ST. NO MORE THAN 2 TABLETS PER DAY -     VITAMIN D 25 Hydroxy (Vit-D Deficiency, Fractures)  Class 2 severe obesity due to excess calories with serious comorbidity in adult, unspecified BMI (HCC) Obesity is 30-39 indicating an excess in caloric intake or underlining conditions. This may lead to other co-morbidities. Lifestyle modifications of diet and  exercise may reduce obesity.  -     Lipid Panel  Smokes tobacco daily Nicotine can decrease circulation in your body and affect every organ. Reseach shows increase in lung cancer and respiratory problems. When you are ready to stop let's talk. -     CBC with Differential -     CMP14+EGFR  Vitamin D deficiency -     VITAMIN D 25 Hydroxy (Vit-D Deficiency, Fractures)  Migraine with aura and with status migrainosus, not intractable Signs can be individualized with throbbing pain on any area of the head.  They may come with an aura-dizziness, nausea, sensitive to light sound or smell.  Triggers can be caused by drinking alcohol smoking or certain medications, eating and drinking certain products  This condition may be triggered or caused by: Caffeine, aged cheese, and chocolate. -     CMP14+EGFR  Fall with injury, initial encounte Followed by orthopedics. Grand children had been playing with water guns in the house just after she waxed her hard wood floors. Her leg went up and she went down. Seen in the emergency room and diagnosis with Trimalleolar fracture.   -     VITAMIN D 25 Hydroxy (Vit-D Deficiency, Fractures)  No orders of the defined types were placed in this encounter.   Follow-up: Return in about 6 months (around 09/06/2019), or if symptoms worsen or fail to improve, for due to fracture follow after cast removed for HTN, hyperlipidemia.    Michelle P Edwards, NP 

## 2019-03-06 NOTE — Patient Instructions (Signed)

## 2019-03-07 ENCOUNTER — Telehealth (INDEPENDENT_AMBULATORY_CARE_PROVIDER_SITE_OTHER): Payer: Self-pay

## 2019-03-07 LAB — CMP14+EGFR
ALT: 14 IU/L (ref 0–32)
AST: 13 IU/L (ref 0–40)
Albumin/Globulin Ratio: 1.5 (ref 1.2–2.2)
Albumin: 4.1 g/dL (ref 3.8–4.8)
Alkaline Phosphatase: 97 IU/L (ref 39–117)
BUN/Creatinine Ratio: 11 (ref 9–23)
BUN: 11 mg/dL (ref 6–24)
Bilirubin Total: <0.2 mg/dL (ref 0.0–1.2)
CO2: 22 mmol/L (ref 20–29)
Calcium: 9.7 mg/dL (ref 8.7–10.2)
Chloride: 103 mmol/L (ref 96–106)
Creatinine, Ser: 0.99 mg/dL (ref 0.57–1.00)
GFR calc Af Amer: 78 mL/min/{1.73_m2} (ref >59)
GFR calc non Af Amer: 68 mL/min/{1.73_m2} (ref >59)
Globulin, Total: 2.7 g/dL (ref 1.5–4.5)
Glucose: 100 mg/dL — ABNORMAL HIGH (ref 65–99)
Potassium: 4.1 mmol/L (ref 3.5–5.2)
Sodium: 142 mmol/L (ref 134–144)
Total Protein: 6.8 g/dL (ref 6.0–8.5)

## 2019-03-07 LAB — LIPID PANEL
Chol/HDL Ratio: 4.3 ratio (ref 0.0–4.4)
Cholesterol, Total: 179 mg/dL (ref 100–199)
HDL: 42 mg/dL (ref >39)
LDL Calculated: 115 mg/dL — ABNORMAL HIGH (ref 0–99)
Triglycerides: 110 mg/dL (ref 0–149)
VLDL Cholesterol Cal: 22 mg/dL (ref 5–40)

## 2019-03-07 LAB — CBC WITH DIFFERENTIAL/PLATELET
Basophils Absolute: 0 10*3/uL (ref 0.0–0.2)
Basos: 0 %
EOS (ABSOLUTE): 0.2 10*3/uL (ref 0.0–0.4)
Eos: 2 %
Hematocrit: 36.2 % (ref 34.0–46.6)
Hemoglobin: 12.2 g/dL (ref 11.1–15.9)
Immature Grans (Abs): 0 10*3/uL (ref 0.0–0.1)
Immature Granulocytes: 0 %
Lymphocytes Absolute: 1.8 10*3/uL (ref 0.7–3.1)
Lymphs: 20 %
MCH: 30.1 pg (ref 26.6–33.0)
MCHC: 33.7 g/dL (ref 31.5–35.7)
MCV: 89 fL (ref 79–97)
Monocytes Absolute: 0.7 10*3/uL (ref 0.1–0.9)
Monocytes: 8 %
Neutrophils Absolute: 6.4 10*3/uL (ref 1.4–7.0)
Neutrophils: 70 %
Platelets: 300 10*3/uL (ref 150–450)
RBC: 4.05 x10E6/uL (ref 3.77–5.28)
RDW: 14.5 % (ref 11.7–15.4)
WBC: 9.1 10*3/uL (ref 3.4–10.8)

## 2019-03-07 LAB — VITAMIN D 25 HYDROXY (VIT D DEFICIENCY, FRACTURES): Vit D, 25-Hydroxy: 29.3 ng/mL — ABNORMAL LOW (ref 30.0–100.0)

## 2019-03-07 NOTE — Telephone Encounter (Signed)
-----   Message from Kerin Perna, NP sent at 03/07/2019  2:46 PM EDT ----- All lab work was normal except vitamin D below normal take over the counter vitamin D 2000iu daily for the next 6 months

## 2019-03-07 NOTE — Telephone Encounter (Signed)
Patient was unavailable, results provided to patient husband per DPR. He is aware that all labs were normal except vitamin D low. Advised to purchase OC vitamin D 2000iu daily for the next 6 months. Patient husband stated he would inform patient of results. Nat Christen, CMA

## 2019-03-08 ENCOUNTER — Ambulatory Visit: Payer: Self-pay

## 2019-03-08 ENCOUNTER — Other Ambulatory Visit: Payer: Self-pay

## 2019-03-08 ENCOUNTER — Ambulatory Visit (INDEPENDENT_AMBULATORY_CARE_PROVIDER_SITE_OTHER): Payer: Medicare Other | Admitting: Orthopaedic Surgery

## 2019-03-08 ENCOUNTER — Encounter: Payer: Self-pay | Admitting: Orthopaedic Surgery

## 2019-03-08 DIAGNOSIS — S82852A Displaced trimalleolar fracture of left lower leg, initial encounter for closed fracture: Secondary | ICD-10-CM | POA: Diagnosis not present

## 2019-03-08 MED ORDER — HYDROCODONE-ACETAMINOPHEN 5-325 MG PO TABS: 1.0000 | ORAL_TABLET | Freq: Three times a day (TID) | ORAL | 0 refills | Status: DC | PRN

## 2019-03-08 NOTE — Progress Notes (Signed)
Office Visit Note   Patient: Patricia Davila           Date of Birth: September 06, 1970           MRN: [id number] Visit Date: 03/08/2019              Requested by: Clent Demark, PA-C No address on file PCP: Clent Demark, PA-C   Assessment & Plan: Visit Diagnoses:  1. Morbid obesity ([place])   2. Closed displaced trimalleolar fracture of left ankle, initial encounter     Plan: Impression is displaced left trimalleolar ankle fracture.  We replaced her splint today.  She is to elevate at all times.  Norco was refilled.  We will allow the swelling to improve some more before we perform definitive fixation next week.  Risks and recovery related to the surgery were reviewed today. Total face to face encounter time was greater than 25 minutes and over half of this time was spent in counseling and/or coordination of care.  Follow-Up Instructions: Return if symptoms worsen or fail to improve.   Orders:  Orders Placed This Encounter  Procedures  . XR Ankle Complete Left  . CT ANKLE LEFT WO CONTRAST   Meds ordered this encounter  Medications  . HYDROcodone-acetaminophen (NORCO) 5-325 MG tablet    Sig: Take 1-2 tablets by mouth 3 (three) times daily as needed.    Dispense:  20 tablet    Refill:  0      Procedures: No procedures performed   Clinical Data: No additional findings.   Subjective: Chief Complaint  Patient presents with  . Left Ankle - Fracture  . Left Foot - Fracture    Patricia Davila is a 48 year old female who is well-known to me who I performed a bilateral total hip replacements on 2 years ago and has done well.  She comes in today for evaluation of a new injury to her left ankle that she suffered when she tripped over some toys at home.  She was evaluated in the ED.  She had a left trimalleolar ankle fracture dislocation.  This was subsequently splinted.  She comes in today for further evaluation and treatment.  She denies any numbness and tingling.   Review of  Systems  Constitutional: Negative.   HENT: Negative.   Eyes: Negative.   Respiratory: Negative.   Cardiovascular: Negative.   Endocrine: Negative.   Musculoskeletal: Negative.   Neurological: Negative.   Hematological: Negative.   Psychiatric/Behavioral: Negative.   All other systems reviewed and are negative.    Objective: Vital Signs: LMP 02/22/2019   Physical Exam Vitals signs and nursing note reviewed.  Constitutional:      Appearance: She is well-developed.  HENT:     Head: Normocephalic and atraumatic.  Neck:     Musculoskeletal: Neck supple.  Pulmonary:     Effort: Pulmonary effort is normal.  Abdominal:     Palpations: Abdomen is soft.  Skin:    General: Skin is warm.     Capillary Refill: Capillary refill takes less than 2 seconds.  Neurological:     Mental Status: She is alert and oriented to person, place, and time.  Psychiatric:        Behavior: Behavior normal.        Thought Content: Thought content normal.        Judgment: Judgment normal.     Ortho Exam Left ankle exam shows moderate swelling.  The skin does not wrinkle when pinched.  No  neurovascular compromise. Specialty Comments:  No specialty comments available.  Imaging: Xr Ankle Complete Left  Result Date: 03/08/2019 Displaced trimalleolar ankle fracture without dislocation.    PMFS History: Patient Active Problem List   Diagnosis Date Noted  . Primary osteoarthritis of right hip 04/08/2017  . History of hip replacement 03/25/2017  . Primary osteoarthritis of left hip 12/22/2016  . Major depressive disorder 10/24/2012  . Smokes tobacco daily 05/13/2010  . Headache(784.0) 05/13/2010  . Microalbuminuria 04/16/2009  . GERD 11/22/2008  . Left hip pain 11/22/2008  . Bipolar 1 disorder, depressed ([place]) 05/17/2007  . Lumbosacral degenerative disk disease 03/23/2005  . Essential hypertension, benign 08/18/2003   Past Medical History:  Diagnosis Date  . Anemia    low iron during  pregnancy  . Anxiety   . Arthritis   . Asthma    as a child  . Bipolar disorder ([place])   . Depression   . GERD (gastroesophageal reflux disease)   . Headache   . Hypertension   . Pneumonia    as a child  . PONV (postoperative nausea and vomiting)   . Restless legs     Family History  Problem Relation Age of Onset  . Colon cancer Mother   . HIV Mother   . Colon cancer Sister   . Breast cancer Neg Hx     Past Surgical History:  Procedure Laterality Date  . BILATERAL ANTERIOR TOTAL HIP ARTHROPLASTY Bilateral 03/25/2017   Procedure: BILATERAL ANTERIOR TOTAL HIP ARTHROPLASTY;  Surgeon: Leandrew Koyanagi, MD;  Location: [facility];  Service: Orthopedics;  Laterality: Bilateral;  . DENTAL SURGERY     all teeth extracted  . LAMINECTOMY AND MICRODISCECTOMY LUMBAR SPINE   06/27/2010  . TUBAL LIGATION Bilateral    Social History   Occupational History  . Not on file  Tobacco Use  . Smoking status: Current Every Day Smoker    Packs/day: 0.25    Types: Cigarettes  . Smokeless tobacco: Never Used  Substance and Sexual Activity  . Alcohol use: No  . Drug use: No  . Sexual activity: Yes    Birth control/protection: None

## 2019-03-09 ENCOUNTER — Telehealth: Payer: Self-pay | Admitting: Orthopaedic Surgery

## 2019-03-09 DIAGNOSIS — S82852A Displaced trimalleolar fracture of left lower leg, initial encounter for closed fracture: Secondary | ICD-10-CM | POA: Diagnosis not present

## 2019-03-09 NOTE — Telephone Encounter (Signed)
IC code given.

## 2019-03-09 NOTE — Telephone Encounter (Signed)
Received call from Patricia Davila with [facility] advised he need diag code on Rx. The number to contact [facility] is [phone]

## 2019-03-10 ENCOUNTER — Ambulatory Visit
Admission: RE | Admit: 2019-03-10 | Discharge: 2019-03-10 | Disposition: A | Discharge status: Home / Self Care | Payer: Medicare Other | Source: Ambulatory Visit | Attending: Orthopaedic Surgery | Admitting: Orthopaedic Surgery

## 2019-03-10 DIAGNOSIS — S8262XD Displaced fracture of lateral malleolus of left fibula, subsequent encounter for closed fracture with routine healing: Secondary | ICD-10-CM | POA: Diagnosis not present

## 2019-03-10 DIAGNOSIS — S82852A Displaced trimalleolar fracture of left lower leg, initial encounter for closed fracture: Secondary | ICD-10-CM

## 2019-03-10 DIAGNOSIS — S82852D Displaced trimalleolar fracture of left lower leg, subsequent encounter for closed fracture with routine healing: Secondary | ICD-10-CM | POA: Diagnosis not present

## 2019-03-13 ENCOUNTER — Telehealth: Payer: Self-pay | Admitting: Orthopaedic Surgery

## 2019-03-13 ENCOUNTER — Encounter (HOSPITAL_BASED_OUTPATIENT_CLINIC_OR_DEPARTMENT_OTHER): Payer: Self-pay | Admitting: *Deleted

## 2019-03-13 ENCOUNTER — Other Ambulatory Visit: Payer: Self-pay

## 2019-03-13 MED ORDER — OXYCODONE-ACETAMINOPHEN 5-325 MG PO TABS: 1.0000 | ORAL_TABLET | Freq: Two times a day (BID) | ORAL | 0 refills | Status: DC | PRN

## 2019-03-13 NOTE — Telephone Encounter (Signed)
Pt scheduled for an ORIF ankle fracture on 03/17/19 and states that her Hydrocodone is not helping. Please advise.

## 2019-03-13 NOTE — Telephone Encounter (Signed)
Patient called and stated that needed something for pain Hydrocodone is not helping.she is so much pain.  Please call patient @ [phone]

## 2019-03-13 NOTE — Telephone Encounter (Signed)
Patient called to stated the Hydrocodone is not helping her. She is still in pain.  Please call patient @ [phone]  Mistakenly sent to [facility] instead of [facility] message box.

## 2019-03-14 ENCOUNTER — Other Ambulatory Visit (HOSPITAL_COMMUNITY)
Admission: RE | Admit: 2019-03-14 | Discharge: 2019-03-14 | Disposition: A | Discharge status: Home / Self Care | Payer: Medicare Other | Source: Ambulatory Visit | Attending: Orthopaedic Surgery | Admitting: Orthopaedic Surgery

## 2019-03-14 ENCOUNTER — Encounter (HOSPITAL_BASED_OUTPATIENT_CLINIC_OR_DEPARTMENT_OTHER)
Admission: RE | Admit: 2019-03-14 | Discharge: 2019-03-14 | Disposition: A | Discharge status: Home / Self Care | Payer: Medicare Other | Source: Ambulatory Visit | Attending: Orthopaedic Surgery | Admitting: Orthopaedic Surgery

## 2019-03-14 DIAGNOSIS — Z20828 Contact with and (suspected) exposure to other viral communicable diseases: Secondary | ICD-10-CM | POA: Diagnosis not present

## 2019-03-14 DIAGNOSIS — S82852A Displaced trimalleolar fracture of left lower leg, initial encounter for closed fracture: Secondary | ICD-10-CM | POA: Insufficient documentation

## 2019-03-14 DIAGNOSIS — Z01810 Encounter for preprocedural cardiovascular examination: Secondary | ICD-10-CM | POA: Insufficient documentation

## 2019-03-14 NOTE — Progress Notes (Signed)
Ensure pre-Surgery drink given to patient with instructions to complete by 0800 DOS.  Patient verbalized understanding of instructions.

## 2019-03-14 NOTE — Telephone Encounter (Signed)
I called and lm on vm to advise of message below. To call with questions.  

## 2019-03-14 NOTE — Telephone Encounter (Signed)
Instead of 1-2 every 8 hours as needed.  She can take 1-2 every 4-6 hours as needed.  Use as little as possible so that pain meds after surgery help enough

## 2019-03-15 LAB — SARS CORONAVIRUS 2 (TAT 6-24 HRS): SARS Coronavirus 2: NEGATIVE

## 2019-03-17 ENCOUNTER — Ambulatory Visit (HOSPITAL_BASED_OUTPATIENT_CLINIC_OR_DEPARTMENT_OTHER): Payer: Medicare Other | Admitting: Certified Registered"

## 2019-03-17 ENCOUNTER — Ambulatory Visit (HOSPITAL_COMMUNITY): Payer: Medicare Other

## 2019-03-17 ENCOUNTER — Encounter (HOSPITAL_BASED_OUTPATIENT_CLINIC_OR_DEPARTMENT_OTHER)
Admission: RE | Disposition: A | Discharge status: Home / Self Care | Payer: Self-pay | Source: Home / Self Care | Attending: Orthopaedic Surgery

## 2019-03-17 ENCOUNTER — Other Ambulatory Visit: Payer: Self-pay

## 2019-03-17 ENCOUNTER — Encounter (HOSPITAL_BASED_OUTPATIENT_CLINIC_OR_DEPARTMENT_OTHER): Payer: Self-pay | Admitting: *Deleted

## 2019-03-17 ENCOUNTER — Ambulatory Visit (HOSPITAL_BASED_OUTPATIENT_CLINIC_OR_DEPARTMENT_OTHER)
Admission: RE | Admit: 2019-03-17 | Discharge: 2019-03-17 | Disposition: A | Discharge status: Home / Self Care | Payer: Medicare Other | Attending: Orthopaedic Surgery | Admitting: Orthopaedic Surgery

## 2019-03-17 DIAGNOSIS — I10 Essential (primary) hypertension: Secondary | ICD-10-CM | POA: Insufficient documentation

## 2019-03-17 DIAGNOSIS — S82852G Displaced trimalleolar fracture of left lower leg, subsequent encounter for closed fracture with delayed healing: Secondary | ICD-10-CM

## 2019-03-17 DIAGNOSIS — K219 Gastro-esophageal reflux disease without esophagitis: Secondary | ICD-10-CM | POA: Diagnosis not present

## 2019-03-17 DIAGNOSIS — Z79899 Other long term (current) drug therapy: Secondary | ICD-10-CM | POA: Insufficient documentation

## 2019-03-17 DIAGNOSIS — X58XXXA Exposure to other specified factors, initial encounter: Secondary | ICD-10-CM | POA: Insufficient documentation

## 2019-03-17 DIAGNOSIS — S82852A Displaced trimalleolar fracture of left lower leg, initial encounter for closed fracture: Secondary | ICD-10-CM | POA: Insufficient documentation

## 2019-03-17 DIAGNOSIS — Z4789 Encounter for other orthopedic aftercare: Secondary | ICD-10-CM

## 2019-03-17 DIAGNOSIS — G8918 Other acute postprocedural pain: Secondary | ICD-10-CM | POA: Diagnosis not present

## 2019-03-17 DIAGNOSIS — F17210 Nicotine dependence, cigarettes, uncomplicated: Secondary | ICD-10-CM | POA: Diagnosis not present

## 2019-03-17 DIAGNOSIS — S8252XD Displaced fracture of medial malleolus of left tibia, subsequent encounter for closed fracture with routine healing: Secondary | ICD-10-CM | POA: Diagnosis not present

## 2019-03-17 HISTORY — DX: Displaced trimalleolar fracture of left lower leg, initial encounter for closed fracture: S82.852A

## 2019-03-17 HISTORY — PX: ORIF ANKLE FRACTURE: SHX5408

## 2019-03-17 SURGERY — OPEN REDUCTION INTERNAL FIXATION (ORIF) ANKLE FRACTURE
Anesthesia: General | Site: Ankle | Laterality: Left

## 2019-03-17 MED ORDER — ROCURONIUM BROMIDE 10 MG/ML (PF) SYRINGE
PREFILLED_SYRINGE | INTRAVENOUS | Status: AC
  Filled 2019-03-17: qty 10

## 2019-03-17 MED ORDER — MIDAZOLAM HCL 2 MG/2ML IJ SOLN
INTRAMUSCULAR | Status: AC
  Filled 2019-03-17: qty 2

## 2019-03-17 MED ORDER — PROPOFOL 10 MG/ML IV BOLUS
INTRAVENOUS | Status: AC
  Filled 2019-03-17: qty 20

## 2019-03-17 MED ORDER — PHENYLEPHRINE 40 MCG/ML (10ML) SYRINGE FOR IV PUSH (FOR BLOOD PRESSURE SUPPORT)
PREFILLED_SYRINGE | INTRAVENOUS | Status: DC | PRN
  Administered 2019-03-17 (×7): 80 ug via INTRAVENOUS
  Administered 2019-03-17: 40 ug via INTRAVENOUS
  Administered 2019-03-17 (×4): 80 ug via INTRAVENOUS
  Administered 2019-03-17: 40 ug via INTRAVENOUS

## 2019-03-17 MED ORDER — FENTANYL CITRATE (PF) 100 MCG/2ML IJ SOLN
INTRAMUSCULAR | Status: AC
  Filled 2019-03-17: qty 2

## 2019-03-17 MED ORDER — MIDAZOLAM HCL 2 MG/2ML IJ SOLN: 1.0000 mg | INTRAMUSCULAR | Status: DC | PRN

## 2019-03-17 MED ORDER — CHLORHEXIDINE GLUCONATE 4 % EX LIQD: 60.0000 mL | Freq: Once | CUTANEOUS | Status: DC

## 2019-03-17 MED ORDER — LACTATED RINGERS IV SOLN: INTRAVENOUS | Status: DC

## 2019-03-17 MED ORDER — CEFAZOLIN SODIUM-DEXTROSE 2-4 GM/100ML-% IV SOLN
INTRAVENOUS | Status: AC
  Filled 2019-03-17: qty 100

## 2019-03-17 MED ORDER — SENNOSIDES-DOCUSATE SODIUM 8.6-50 MG PO TABS: 1.0000 | ORAL_TABLET | Freq: Every evening | ORAL | 1 refills | Status: DC | PRN

## 2019-03-17 MED ORDER — FENTANYL CITRATE (PF) 100 MCG/2ML IJ SOLN
INTRAMUSCULAR | Status: DC | PRN
  Administered 2019-03-17 (×6): 50 ug via INTRAVENOUS

## 2019-03-17 MED ORDER — ROCURONIUM BROMIDE 100 MG/10ML IV SOLN
INTRAVENOUS | Status: DC | PRN
  Administered 2019-03-17: 60 mg via INTRAVENOUS
  Administered 2019-03-17 (×2): 5 mg via INTRAVENOUS

## 2019-03-17 MED ORDER — PHENYLEPHRINE 40 MCG/ML (10ML) SYRINGE FOR IV PUSH (FOR BLOOD PRESSURE SUPPORT)
PREFILLED_SYRINGE | INTRAVENOUS | Status: AC
  Filled 2019-03-17: qty 10

## 2019-03-17 MED ORDER — LIDOCAINE 2% (20 MG/ML) 5 ML SYRINGE
INTRAMUSCULAR | Status: AC
  Filled 2019-03-17: qty 5

## 2019-03-17 MED ORDER — OXYCODONE-ACETAMINOPHEN 5-325 MG PO TABS: 1.0000 | ORAL_TABLET | Freq: Three times a day (TID) | ORAL | 0 refills | Status: DC | PRN

## 2019-03-17 MED ORDER — BUPIVACAINE-EPINEPHRINE (PF) 0.5% -1:200000 IJ SOLN
INTRAMUSCULAR | Status: DC | PRN
  Administered 2019-03-17: 30 mL via PERINEURAL

## 2019-03-17 MED ORDER — GLYCOPYRROLATE PF 0.2 MG/ML IJ SOSY
PREFILLED_SYRINGE | INTRAMUSCULAR | Status: AC
  Filled 2019-03-17: qty 2

## 2019-03-17 MED ORDER — ONDANSETRON HCL 4 MG/2ML IJ SOLN
INTRAMUSCULAR | Status: AC
  Filled 2019-03-17: qty 2

## 2019-03-17 MED ORDER — CALCIUM CARBONATE-VITAMIN D 500-200 MG-UNIT PO TABS: 1.0000 | ORAL_TABLET | Freq: Three times a day (TID) | ORAL | 6 refills | Status: DC

## 2019-03-17 MED ORDER — ASPIRIN EC 81 MG PO TBEC: 81.0000 mg | DELAYED_RELEASE_TABLET | Freq: Two times a day (BID) | ORAL | 0 refills | Status: DC

## 2019-03-17 MED ORDER — LIDOCAINE-EPINEPHRINE (PF) 1.5 %-1:200000 IJ SOLN
INTRAMUSCULAR | Status: DC | PRN
  Administered 2019-03-17: 30 mL via PERINEURAL

## 2019-03-17 MED ORDER — OXYCODONE HCL ER 10 MG PO T12A: 10.0000 mg | EXTENDED_RELEASE_TABLET | Freq: Two times a day (BID) | ORAL | 0 refills | Status: AC

## 2019-03-17 MED ORDER — SCOPOLAMINE 1 MG/3DAYS TD PT72: 1.0000 | MEDICATED_PATCH | Freq: Once | TRANSDERMAL | Status: DC

## 2019-03-17 MED ORDER — GLYCOPYRROLATE 0.2 MG/ML IJ SOLN
INTRAMUSCULAR | Status: DC | PRN
  Administered 2019-03-17: 0.1 mg via INTRAVENOUS

## 2019-03-17 MED ORDER — SUGAMMADEX SODIUM 200 MG/2ML IV SOLN
INTRAVENOUS | Status: DC | PRN
  Administered 2019-03-17: 200 mg via INTRAVENOUS

## 2019-03-17 MED ORDER — PROPOFOL 10 MG/ML IV BOLUS
INTRAVENOUS | Status: DC | PRN
  Administered 2019-03-17: 120 mg via INTRAVENOUS
  Administered 2019-03-17: 30 mg via INTRAVENOUS

## 2019-03-17 MED ORDER — METHOCARBAMOL 750 MG PO TABS: 750.0000 mg | ORAL_TABLET | Freq: Two times a day (BID) | ORAL | 0 refills | Status: DC | PRN

## 2019-03-17 MED ORDER — CEFAZOLIN SODIUM-DEXTROSE 2-4 GM/100ML-% IV SOLN
2.0000 g | INTRAVENOUS | Status: AC
  Administered 2019-03-17: 12:00:00 2 g via INTRAVENOUS

## 2019-03-17 MED ORDER — LIDOCAINE 2% (20 MG/ML) 5 ML SYRINGE
INTRAMUSCULAR | Status: DC | PRN
  Administered 2019-03-17: 100 mg via INTRAVENOUS

## 2019-03-17 MED ORDER — DEXAMETHASONE SODIUM PHOSPHATE 10 MG/ML IJ SOLN
INTRAMUSCULAR | Status: DC | PRN
  Administered 2019-03-17: 10 mg via INTRAVENOUS

## 2019-03-17 MED ORDER — ONDANSETRON HCL 4 MG/2ML IJ SOLN
INTRAMUSCULAR | Status: DC | PRN
  Administered 2019-03-17: 4 mg via INTRAVENOUS

## 2019-03-17 MED ORDER — FENTANYL CITRATE (PF) 100 MCG/2ML IJ SOLN
50.0000 ug | INTRAMUSCULAR | Status: DC | PRN
  Administered 2019-03-17: 100 ug via INTRAVENOUS

## 2019-03-17 MED ORDER — LACTATED RINGERS IV SOLN
INTRAVENOUS | Status: DC
  Administered 2019-03-17 (×2): via INTRAVENOUS

## 2019-03-17 SURGICAL SUPPLY — 96 items
BANDAGE ESMARK 6X9 LF (GAUZE/BANDAGES/DRESSINGS) ×1 IMPLANT
BIT DRILL 2.7 QC CANN 155 (BIT) ×1 IMPLANT
BIT DRILL 2.7 QC CANN 155MM (BIT) ×1
BIT DRILL QC 2.0 SHORT EVOS SM (DRILL) IMPLANT
BIT DRILL QC 2.5MM SHRT EVO SM (DRILL) IMPLANT
BLADE HEX COATED 2.75 (ELECTRODE) ×3 IMPLANT
BLADE SURG 15 STRL LF DISP TIS (BLADE) ×2 IMPLANT
BLADE SURG 15 STRL SS (BLADE) ×6
BNDG CMPR 9X6 STRL LF SNTH (GAUZE/BANDAGES/DRESSINGS) ×1
BNDG COHESIVE 6X5 TAN STRL LF (GAUZE/BANDAGES/DRESSINGS) ×3 IMPLANT
BNDG ELASTIC 4X5.8 VLCR STR LF (GAUZE/BANDAGES/DRESSINGS) IMPLANT
BNDG ELASTIC 6X5.8 VLCR STR LF (GAUZE/BANDAGES/DRESSINGS) ×3 IMPLANT
BNDG ESMARK 6X9 LF (GAUZE/BANDAGES/DRESSINGS) ×3
BRUSH SCRUB EZ PLAIN DRY (MISCELLANEOUS) ×3 IMPLANT
CANISTER SUCT 1200ML W/VALVE (MISCELLANEOUS) ×3 IMPLANT
COVER BACK TABLE REUSABLE LG (DRAPES) ×3 IMPLANT
COVER MAYO STAND REUSABLE (DRAPES) IMPLANT
COVER WAND RF STERILE (DRAPES) IMPLANT
CUFF TOURN SGL QUICK 34 (TOURNIQUET CUFF) ×3
CUFF TRNQT CYL 34X4.125X (TOURNIQUET CUFF) IMPLANT
DECANTER SPIKE VIAL GLASS SM (MISCELLANEOUS) IMPLANT
DRAPE C-ARM 42X72 X-RAY (DRAPES) ×3 IMPLANT
DRAPE C-ARMOR (DRAPES) ×3 IMPLANT
DRAPE EXTREMITY T 121X128X90 (DISPOSABLE) ×3 IMPLANT
DRAPE HALF SHEET 70X43 (DRAPES) ×3 IMPLANT
DRAPE IMP U-DRAPE 54X76 (DRAPES) ×3 IMPLANT
DRAPE INCISE IOBAN 66X45 STRL (DRAPES) IMPLANT
DRAPE SURG 17X23 STRL (DRAPES) ×6 IMPLANT
DRILL QC 2.0 SHORT EVOS SM (DRILL) ×3
DRILL QC 2.5MM SHORT EVOS SM (DRILL) ×3
DRSG PAD ABDOMINAL 8X10 ST (GAUZE/BANDAGES/DRESSINGS) ×6 IMPLANT
DURAPREP 26ML APPLICATOR (WOUND CARE) ×6 IMPLANT
ELECT REM PT RETURN 9FT ADLT (ELECTROSURGICAL) ×3
ELECTRODE REM PT RTRN 9FT ADLT (ELECTROSURGICAL) ×1 IMPLANT
GAUZE SPONGE 4X4 12PLY STRL (GAUZE/BANDAGES/DRESSINGS) ×3 IMPLANT
GAUZE XEROFORM 1X8 LF (GAUZE/BANDAGES/DRESSINGS) ×3 IMPLANT
GLOVE BIOGEL PI IND STRL 7.0 (GLOVE) ×1 IMPLANT
GLOVE BIOGEL PI INDICATOR 7.0 (GLOVE) ×2
GLOVE ECLIPSE 7.0 STRL STRAW (GLOVE) ×3 IMPLANT
GLOVE SKINSENSE NS SZ7.5 (GLOVE) ×2
GLOVE SKINSENSE STRL SZ7.5 (GLOVE) ×1 IMPLANT
GLOVE SURG SYN 7.5  E (GLOVE) ×2
GLOVE SURG SYN 7.5 E (GLOVE) ×1 IMPLANT
GLOVE SURG SYN 7.5 PF PI (GLOVE) ×1 IMPLANT
GOWN STRL REIN XL XLG (GOWN DISPOSABLE) ×3 IMPLANT
GOWN STRL REUS W/ TWL LRG LVL3 (GOWN DISPOSABLE) ×1 IMPLANT
GOWN STRL REUS W/ TWL XL LVL3 (GOWN DISPOSABLE) ×1 IMPLANT
GOWN STRL REUS W/TWL LRG LVL3 (GOWN DISPOSABLE) ×3
GOWN STRL REUS W/TWL XL LVL3 (GOWN DISPOSABLE) ×3
GUIDE PIN 1.3 (PIN) ×6
MANIFOLD NEPTUNE II (INSTRUMENTS) ×3 IMPLANT
NEEDLE HYPO 22GX1.5 SAFETY (NEEDLE) IMPLANT
NS IRRIG 1000ML POUR BTL (IV SOLUTION) ×3 IMPLANT
PACK BASIN DAY SURGERY FS (CUSTOM PROCEDURE TRAY) ×3 IMPLANT
PAD CAST 3X4 CTTN HI CHSV (CAST SUPPLIES) IMPLANT
PAD CAST 4YDX4 CTTN HI CHSV (CAST SUPPLIES) IMPLANT
PADDING CAST COTTON 3X4 STRL (CAST SUPPLIES)
PADDING CAST COTTON 4X4 STRL (CAST SUPPLIES)
PADDING CAST COTTON 6X4 STRL (CAST SUPPLIES) IMPLANT
PADDING CAST SYN 6 (CAST SUPPLIES) ×2
PADDING CAST SYNTHETIC 4 (CAST SUPPLIES) ×6
PADDING CAST SYNTHETIC 4X4 STR (CAST SUPPLIES) ×1 IMPLANT
PADDING CAST SYNTHETIC 6X4 NS (CAST SUPPLIES) ×1 IMPLANT
PENCIL BUTTON HOLSTER BLD 10FT (ELECTRODE) ×3 IMPLANT
PIN GUIDE 1.3 (PIN) IMPLANT
PLATE POST FIB 7H 27 3.5 L (Plate) ×2 IMPLANT
SCREW CANN2.5XFLUT 38X14X4 (Screw) IMPLANT
SCREW CANNULATED 4.0X35 (Screw) ×2 IMPLANT
SCREW CANNULATED 4.0X36 (Screw) ×4 IMPLANT
SCREW CANNULATED 4.0X38 (Screw) ×3 IMPLANT
SCREW CORT 2.7X12 EVOS (Screw) IMPLANT
SCREW CORT 2.7X15 T8 ST EVOS (Screw) ×2 IMPLANT
SCREW CORT 2.7X17 ST T8 EVOS (Screw) ×2 IMPLANT
SCREW CORT 3.5X10MM ST EVOS (Screw) ×8 IMPLANT
SCREW CORT EVOS ST T8 2.7X14MM (Screw) ×3 IMPLANT
SCREW EVOS 2.7X18 LOCK T8 (Screw) ×2 IMPLANT
SLEEVE SCD COMPRESS KNEE MED (MISCELLANEOUS) ×3 IMPLANT
SPLINT FIBERGLASS 4X30 (CAST SUPPLIES) ×2 IMPLANT
SPONGE LAP 18X18 RF (DISPOSABLE) ×3 IMPLANT
SUCTION FRAZIER HANDLE 10FR (MISCELLANEOUS) ×2
SUCTION TUBE FRAZIER 10FR DISP (MISCELLANEOUS) ×1 IMPLANT
SUT ETHILON 3 0 PS 1 (SUTURE) IMPLANT
SUT VIC AB 0 CT1 27 (SUTURE)
SUT VIC AB 0 CT1 27XBRD ANBCTR (SUTURE) IMPLANT
SUT VIC AB 2-0 CT1 27 (SUTURE) ×6
SUT VIC AB 2-0 CT1 TAPERPNT 27 (SUTURE) ×1 IMPLANT
SUT VIC AB 3-0 SH 27 (SUTURE)
SUT VIC AB 3-0 SH 27X BRD (SUTURE) IMPLANT
SYR BULB 3OZ (MISCELLANEOUS) ×3 IMPLANT
SYR CONTROL 10ML LL (SYRINGE) IMPLANT
TOWEL GREEN STERILE FF (TOWEL DISPOSABLE) ×3 IMPLANT
TRAY DSU PREP LF (CUSTOM PROCEDURE TRAY) ×3 IMPLANT
TUBE CONNECTING 20'X1/4 (TUBING) ×1
TUBE CONNECTING 20X1/4 (TUBING) ×2 IMPLANT
UNDERPAD 30X30 (UNDERPADS AND DIAPERS) ×3 IMPLANT
YANKAUER SUCT BULB TIP NO VENT (SUCTIONS) ×3 IMPLANT

## 2019-03-17 NOTE — Anesthesia Procedure Notes (Signed)
Anesthesia Regional Block: Popliteal block   Pre-Anesthetic Checklist: ,, timeout performed, Correct Patient, Correct Site, Correct Laterality, Correct Procedure, Correct Position, site marked, Risks and benefits discussed,  Surgical consent,  Pre-op evaluation,  At surgeon's request and post-op pain management  Laterality: Left  Prep: chloraprep       Needles:  Injection technique: Single-shot  Needle Type: Echogenic Stimulator Needle     Needle Length: 10cm  Needle Gauge: 21     Additional Needles:   Procedures:, nerve stimulator,,,,,,,   Nerve Stimulator or Paresthesia:  Response: 0.4 mA,   Additional Responses:   Narrative:  Start time: 03/17/2019 11:45 AM End time: 03/17/2019 11:55 AM Injection made incrementally with aspirations every 5 mL.  Performed by: Personally  Anesthesiologist: Lillia Abed, MD  Additional Notes: Monitors applied. Patient sedated. Sterile prep and drape,hand hygiene and sterile gloves were used. Relevant anatomy identified.Needle position confirmed.Local anesthetic injected incrementally after negative aspiration. Local anesthetic spread visualized around nerve(s). Vascular puncture avoided. No complications. Image printed for medical record.The patient tolerated the procedure well.  Additional Saphenous nerve block performed. 15cc Local Anesthetic mixture placed under ultrasonic guidance along the medio-inferior border of the Sartorious muscle 6 inches above the knee.  No Problems encountered.  Lillia Abed MD

## 2019-03-17 NOTE — Anesthesia Postprocedure Evaluation (Signed)
Anesthesia Post Note  Patient: Patricia Davila  Procedure(s) Performed: OPEN REDUCTION INTERNAL FIXATION (ORIF) LEFT ANKLE FRACTURE (Left Ankle)     Patient location during evaluation: PACU Anesthesia Type: General Level of consciousness: awake and alert Pain management: pain level controlled Vital Signs Assessment: post-procedure vital signs reviewed and stable Respiratory status: spontaneous breathing, nonlabored ventilation, respiratory function stable and patient connected to nasal cannula oxygen Cardiovascular status: blood pressure returned to baseline and stable Postop Assessment: no apparent nausea or vomiting Anesthetic complications: no    Last Vitals:  Vitals:   03/17/19 1530 03/17/19 1600  BP:    Pulse:  77  Resp:  16  Temp:    SpO2: 100% 100%    Last Pain:  Vitals:   03/17/19 1600  TempSrc:   PainSc: 0-No pain                 [name]

## 2019-03-17 NOTE — Op Note (Signed)
Date of Surgery: 03/17/2019  INDICATIONS: Patricia Davila is a 48 y.o.-year-old female who sustained a left ankle fracture; she was indicated for open reduction and internal fixation due to the displaced nature of the articular fracture and came to the operating room today for this procedure. The patient did consent to the procedure after discussion of the risks and benefits.  PREOPERATIVE DIAGNOSIS: left trimalleolar ankle fracture  POSTOPERATIVE DIAGNOSIS: Same.  PROCEDURE: Open treatment of left ankle fracture with internal fixation.  Trimalleolar w/ fixation of posterior malleolus CPT 27823.  SURGEON: N. Eduard Roux, M.D.  ASSIST: Ciro Backer [place], [place]; necessary for the timely completion of procedure and due to complexity of procedure.  ANESTHESIA:  general, popliteal block  TOURNIQUET TIME: see record  IV FLUIDS AND URINE: See anesthesia.  ESTIMATED BLOOD LOSS: minimal mL.  IMPLANTS: Tamala Julian and Nephew  COMPLICATIONS: see description of procedure.  DESCRIPTION OF PROCEDURE: The patient was brought to the operating room and placed prone on the operating table.  The patient had been signed prior to the procedure and this was documented. The patient had the anesthesia placed by the anesthesiologist.  A nonsterile tourniquet was placed on the upper thigh.  The prep verification and incision time-outs were performed to confirm that this was the correct patient, site, side and location. The patient had an SCD on the opposite lower extremity. The patient did receive antibiotics prior to the incision and was re-dosed during the procedure as needed at indicated intervals.  The patient had the lower extremity prepped and draped in the standard surgical fashion.  The extremity was exsanguinated using an esmarch bandage and the tourniquet was inflated to 300 mm Hg.  An incision was made halfway between the fibula and the Achilles tendon for posterior lateral approach to the ankle.  Dissection was  carried through the subcutaneous tissue.  The sural nerve was identified and mobilized.  The fascia was sharply incised in line with the incision.  FHL was then elevated off of the posterior surface of the distal tibia.  The peroneal tendons were also mobilized off of the fibula.  We first addressed the fibula fracture.  The fracture lines were visualized and organized hematoma was removed from the fracture site.  The fracture was mobilized and brought into length and alignment.  Given the orientation of the fracture like fixation was not feasible therefore we placed a posterior lateral plate at the appropriate position which was then clamped to the fibula with the fracture reduced.  I then placed 3 nonlocking screws proximal to the fracture in a bicortical fashion with excellent purchase using fluoroscopic guidance.  I then placed four 2.7 mm nonlocking and locking screws distal to the fracture using fluoroscopic guidance.  An additional 3.5 mm nonlocking screw was placed in the most proximal hole of the plate.  The clamp was removed and the fracture remained reduced.  We then placed retractors for fixation of the posterior malleolus.  2 parallel K wires were advanced across the fracture from a posterior to anterior direction using fluoroscopic guidance.  Partially-threaded cannulated screws were used in order to gain compression across the fracture which was visualized on serial fluoroscopy.  Once this was done I then made a separate incision over the medial malleolus.  Dissection was performed through the subcutaneous tissue.  Saphenous vein and nerves were identified and mobilized.  The fracture was visualized and organized hematoma was removed from the fracture.  The fracture was reduced and 2 parallel K wires  were placed across the fracture using fluoroscopic guidance.  The near cortex was then drilled and a partially-threaded 36 mm screw was placed over the posterior K wire and a 35 mm partially-threaded  screw was placed over the anterior screw.  The fracture getting good compression.  Stress exam of the ankle showed no widening the medial clear space.  The surgical wounds were then thoroughly irrigated and closed in layered fashion using 0 Vicryl, 2-0 Vicryl, 3-0 nylon.  Sterile dressings were applied.  Foot was immobilized in a short leg splint.  Patient tolerated procedure well had no any complications.  POSTOPERATIVE PLAN: Ms. Wayment will remain nonweightbearing on this leg for approximately 6 weeks; Ms. Chahal will return for suture removal in 2 weeks.  He will be immobilized in a short leg splint and then transitioned to a CAM walker at his first follow up appointment.  Ms. Frakes will receive DVT prophylaxis based on other medications, activity level, and risk ratio of bleeding to thrombosis.  Azucena Cecil, MD [facility] 1:53 PM

## 2019-03-17 NOTE — Anesthesia Procedure Notes (Signed)
Procedure Name: Intubation Date/Time: 03/17/2019 12:15 PM Performed by: Gwyndolyn Saxon, CRNA Pre-anesthesia Checklist: Patient identified, Emergency Drugs available, Suction available and Patient being monitored Patient Re-evaluated:Patient Re-evaluated prior to induction Oxygen Delivery Method: Circle system utilized Preoxygenation: Pre-oxygenation with 100% oxygen Induction Type: IV induction Ventilation: Mask ventilation without difficulty Laryngoscope Size: Miller and 2 Grade View: Grade I Tube type: Oral Tube size: 7.0 mm Number of attempts: 1 Airway Equipment and Method: Patient positioned with wedge pillow and Stylet Placement Confirmation: ETT inserted through vocal cords under direct vision,  positive ETCO2 and breath sounds checked- equal and bilateral Secured at: 21 cm Tube secured with: Tape Dental Injury: Teeth and Oropharynx as per pre-operative assessment

## 2019-03-17 NOTE — Transfer of Care (Signed)
Immediate Anesthesia Transfer of Care Note  Patient: Patricia Davila  Procedure(s) Performed: OPEN REDUCTION INTERNAL FIXATION (ORIF) LEFT ANKLE FRACTURE (Left Ankle)  Patient Location: PACU  Anesthesia Type:General and Regional  Level of Consciousness: awake and alert   Airway & Oxygen Therapy: Patient Spontanous Breathing and Patient connected to face mask oxygen  Post-op Assessment: Report given to RN and Post -op Vital signs reviewed and stable  Post vital signs: Reviewed and stable  Last Vitals:  Vitals Value Taken Time  BP 144/82 03/17/19 1456  Temp 36.4 C 03/17/19 1450  Pulse 88 03/17/19 1457  Resp 15 03/17/19 1457  SpO2 100 % 03/17/19 1457  Vitals shown include unvalidated device data.  Last Pain:  Vitals:   03/17/19 1122  TempSrc: Oral  PainSc: 6       Patients Stated Pain Goal: 6 (82/06/01 5615)  Complications: No apparent anesthesia complications

## 2019-03-17 NOTE — Progress Notes (Signed)
Patient complaining of shortness of breath. Patients oxygen saturation 100% on room air. MD notified. Patient and husband stated that this happens to her after she has anesthesia. Husband also reported that patient has panic attacks and does not take anything. No new orders per MD. Will continue to closely monior patient.

## 2019-03-17 NOTE — Discharge Instructions (Signed)
    1. Keep splint clean and dry 2. Elevate foot above level of the heart 3. Take aspirin to prevent blood clots 4. Take pain meds as needed 5. Strict non weight bearing to operative extremity   Post Anesthesia Home Care Instructions  Activity: Get plenty of rest for the remainder of the day. A responsible individual must stay with you for 24 hours following the procedure.  For the next 24 hours, DO NOT: -Drive a car -Operate machinery -Drink alcoholic beverages -Take any medication unless instructed by your physician -Make any legal decisions or sign important papers.  Meals: Start with liquid foods such as gelatin or soup. Progress to regular foods as tolerated. Avoid greasy, spicy, heavy foods. If nausea and/or vomiting occur, drink only clear liquids until the nausea and/or vomiting subsides. Call your physician if vomiting continues.  Special Instructions/Symptoms: Your throat may feel dry or sore from the anesthesia or the breathing tube placed in your throat during surgery. If this causes discomfort, gargle with warm salt water. The discomfort should disappear within 24 hours.  If you had a scopolamine patch placed behind your ear for the management of post- operative nausea and/or vomiting:  1. The medication in the patch is effective for 72 hours, after which it should be removed.  Wrap patch in a tissue and discard in the trash. Wash hands thoroughly with soap and water. 2. You may remove the patch earlier than 72 hours if you experience unpleasant side effects which may include dry mouth, dizziness or visual disturbances. 3. Avoid touching the patch. Wash your hands with soap and water after contact with the patch.  Regional Anesthesia Blocks  1. Numbness or the inability to move the "blocked" extremity may last from 3-48 hours after placement. The length of time depends on the medication injected and your individual response to the medication. If the numbness is not  going away after 48 hours, call your surgeon.  2. The extremity that is blocked will need to be protected until the numbness is gone and the  Strength has returned. Because you cannot feel it, you will need to take extra care to avoid injury. Because it may be weak, you may have difficulty moving it or using it. You may not know what position it is in without looking at it while the block is in effect.  3. For blocks in the legs and feet, returning to weight bearing and walking needs to be done carefully. You will need to wait until the numbness is entirely gone and the strength has returned. You should be able to move your leg and foot normally before you try and bear weight or walk. You will need someone to be with you when you first try to ensure you do not fall and possibly risk injury.  4. Bruising and tenderness at the needle site are common side effects and will resolve in a few days.  5. Persistent numbness or new problems with movement should be communicated to the surgeon or the [facility] ([phone])/ [facility] ([phone]).      

## 2019-03-17 NOTE — H&P (Signed)
PREOPERATIVE H&P  Chief Complaint: left trimalleolar ankle fracture  HPI: Patricia Davila is a 48 y.o. female who presents for surgical treatment of left trimalleolar ankle fracture.  She denies any changes in medical history.  Past Medical History:  Diagnosis Date  . Anemia    low iron during pregnancy  . Anxiety   . Arthritis   . Asthma    as a child  . Bipolar disorder ([place])   . Depression   . GERD (gastroesophageal reflux disease)   . Headache   . Hypertension   . Left trimalleolar fracture   . Pneumonia    as a child  . PONV (postoperative nausea and vomiting)   . Restless legs    Past Surgical History:  Procedure Laterality Date  . BILATERAL ANTERIOR TOTAL HIP ARTHROPLASTY Bilateral 03/25/2017   Procedure: BILATERAL ANTERIOR TOTAL HIP ARTHROPLASTY;  Surgeon: Leandrew Koyanagi, MD;  Location: [facility];  Service: Orthopedics;  Laterality: Bilateral;  . DENTAL SURGERY     all teeth extracted  . LAMINECTOMY AND MICRODISCECTOMY LUMBAR SPINE   06/27/2010  . TUBAL LIGATION Bilateral    Social History   Socioeconomic History  . Marital status: Married    Spouse name: Not on file  . Number of children: Not on file  . Years of education: Not on file  . Highest education level: Not on file  Occupational History  . Not on file  Social Needs  . Financial resource strain: Not on file  . Food insecurity    Worry: Not on file    Inability: Not on file  . Transportation needs    Medical: Not on file    Non-medical: Not on file  Tobacco Use  . Smoking status: Current Every Day Smoker    Packs/day: 0.25    Types: Cigarettes  . Smokeless tobacco: Never Used  Substance and Sexual Activity  . Alcohol use: No  . Drug use: No  . Sexual activity: Yes    Birth control/protection: Surgical    Comment: BTL  Lifestyle  . Physical activity    Days per week: Not on file    Minutes per session: Not on file  . Stress: Not on file  Relationships  . Social Herbalist on  phone: Not on file    Gets together: Not on file    Attends religious service: Not on file    Active member of club or organization: Not on file    Attends meetings of clubs or organizations: Not on file    Relationship status: Not on file  Other Topics Concern  . Not on file  Social History Narrative  . Not on file   Family History  Problem Relation Age of Onset  . Colon cancer Mother   . HIV Mother   . Colon cancer Sister   . Breast cancer Neg Hx    Allergies  Allergen Reactions  . Ativan [Lorazepam] Anxiety    HALLUCINATIONS  . Tylenol With Codeine #3 [Acetaminophen-Codeine] Hives and Itching   Prior to Admission medications   Medication Sig Start Date End Date Taking? Authorizing Provider  Cholecalciferol (VITAMIN D) 50 MCG (2000 UT) tablet Take 1 tablet (2,000 Units total) by mouth daily. 03/06/19  Yes Kerin Perna, NP  hydrochlorothiazide (HYDRODIURIL) 25 MG tablet Take 1 tablet (25 mg total) by mouth daily. Take on tablet in the morning. 03/06/19  Yes Kerin Perna, NP  HYDROcodone-acetaminophen (NORCO) 5-325 MG  tablet Take 1-2 tablets by mouth 3 (three) times daily as needed. 03/08/19  Yes Leandrew Koyanagi, MD  losartan (COZAAR) 50 MG tablet Take 1 tablet (50 mg total) by mouth daily. 03/06/19  Yes Kerin Perna, NP  omeprazole (PRILOSEC) 40 MG capsule Take 1 capsule (40 mg total) by mouth daily. 03/06/19  Yes Kerin Perna, NP  SUMAtriptan (IMITREX) 25 MG tablet TAKE 1 TABLET DAILY. MAY TAKE 1 MORE TABLET 2 HRS AFTER 1ST. NO MORE THAN 2 TABLETS PER DAY 03/06/19  Yes Kerin Perna, NP  HYDROcodone-acetaminophen (NORCO/VICODIN) 5-325 MG tablet Take 1-2 tablets by mouth every 6 (six) hours as needed for severe pain. 03/04/19   Hayden Rasmussen, MD  oxyCODONE-acetaminophen (PERCOCET) 5-325 MG tablet Take 1-2 tablets by mouth 2 (two) times daily as needed for severe pain. 03/13/19   Leandrew Koyanagi, MD     Positive ROS: All other systems have been reviewed  and were otherwise negative with the exception of those mentioned in the HPI and as above.  Physical Exam: General: Alert, no acute distress Cardiovascular: No pedal edema Respiratory: No cyanosis, no use of accessory musculature GI: abdomen soft Skin: No lesions in the area of chief complaint Neurologic: Sensation intact distally Psychiatric: Patient is competent for consent with normal mood and affect Lymphatic: no lymphedema  MUSCULOSKELETAL: exam stable  Assessment: left trimalleolar ankle fracture  Plan: Plan for Procedure(s): OPEN REDUCTION INTERNAL FIXATION (ORIF) LEFT ANKLE FRACTURE  The risks benefits and alternatives were discussed with the patient including but not limited to the risks of nonoperative treatment, versus surgical intervention including infection, bleeding, nerve injury,  blood clots, cardiopulmonary complications, morbidity, mortality, among others, and they were willing to proceed.   Eduard Roux, MD   03/17/2019 8:07 AM

## 2019-03-17 NOTE — Anesthesia Preprocedure Evaluation (Signed)
Anesthesia Evaluation  Patient identified by MRN, date of birth, ID band Patient awake    Reviewed: Allergy & Precautions, NPO status , Patient's Chart, lab work & pertinent test results  History of Anesthesia Complications (+) PONV  Airway Mallampati: I  TM Distance: >3 FB Neck ROM: Full    Dental   Pulmonary Current Smoker,    Pulmonary exam normal        Cardiovascular hypertension, Pt. on medications Normal cardiovascular exam     Neuro/Psych Anxiety Depression Bipolar Disorder    GI/Hepatic GERD  Medicated and Controlled,  Endo/Other    Renal/GU      Musculoskeletal   Abdominal   Peds  Hematology   Anesthesia Other Findings   Reproductive/Obstetrics                             Anesthesia Physical Anesthesia Plan  ASA: III  Anesthesia Plan: General   Post-op Pain Management:  Regional for Post-op pain   Induction: Intravenous  PONV Risk Score and Plan: 3 and Ondansetron, Midazolam and Dexamethasone  Airway Management Planned: LMA  Additional Equipment:   Intra-op Plan:   Post-operative Plan: Extubation in OR  Informed Consent: I have reviewed the patients History and Physical, chart, labs and discussed the procedure including the risks, benefits and alternatives for the proposed anesthesia with the patient or authorized representative who has indicated his/her understanding and acceptance.       Plan Discussed with: CRNA and Surgeon  Anesthesia Plan Comments:         Anesthesia Quick Evaluation

## 2019-03-17 NOTE — Progress Notes (Signed)
Assisted Dr. Ossey with left, ultrasound guided, popliteal/saphenous block. Side rails up, monitors on throughout procedure. See vital signs in flow sheet. Tolerated Procedure well. 

## 2019-03-21 ENCOUNTER — Encounter (HOSPITAL_BASED_OUTPATIENT_CLINIC_OR_DEPARTMENT_OTHER): Payer: Self-pay | Admitting: Orthopaedic Surgery

## 2019-03-27 ENCOUNTER — Telehealth: Payer: Self-pay | Admitting: Orthopaedic Surgery

## 2019-03-27 MED ORDER — OXYCODONE-ACETAMINOPHEN 5-325 MG PO TABS: 1.0000 | ORAL_TABLET | Freq: Two times a day (BID) | ORAL | 0 refills | Status: DC | PRN

## 2019-03-27 NOTE — Telephone Encounter (Signed)
Patient called requesting an RX Refill on her OxyContin and Oxycodone.  Patient uses [facility] on [address].  CB#[phone].  Thank you.

## 2019-03-27 NOTE — Telephone Encounter (Signed)
Please advise. Thanks.  

## 2019-03-31 ENCOUNTER — Ambulatory Visit (INDEPENDENT_AMBULATORY_CARE_PROVIDER_SITE_OTHER): Payer: Medicare Other | Admitting: Orthopaedic Surgery

## 2019-03-31 ENCOUNTER — Encounter: Payer: Self-pay | Admitting: Orthopaedic Surgery

## 2019-03-31 ENCOUNTER — Ambulatory Visit (INDEPENDENT_AMBULATORY_CARE_PROVIDER_SITE_OTHER): Payer: Medicare Other

## 2019-03-31 ENCOUNTER — Other Ambulatory Visit: Payer: Self-pay

## 2019-03-31 VITALS — Ht 61.5 in

## 2019-03-31 DIAGNOSIS — S82852G Displaced trimalleolar fracture of left lower leg, subsequent encounter for closed fracture with delayed healing: Secondary | ICD-10-CM

## 2019-03-31 MED ORDER — NAPROXEN 500 MG PO TABS: 500.0000 mg | ORAL_TABLET | Freq: Two times a day (BID) | ORAL | 3 refills | Status: DC

## 2019-03-31 NOTE — Progress Notes (Signed)
Post-Op Visit Note   Patient: Patricia Davila           Date of Birth: 1970/10/13           MRN: [id number] Visit Date: 03/31/2019 PCP: Patricia Demark, PA-C   Assessment & Plan:  Chief Complaint:  Chief Complaint  Patient presents with  . Left Ankle - Routine Post Op    Left ankle ORIF DOS 03/17/2019   Visit Diagnoses:  1. Displaced trimalleolar fracture of left lower leg, subsequent encounter for closed fracture with delayed healing     Plan: [name] is two-week status post ORIF of a right trimalleolar ankle fracture dislocation.  She states that she has a lot of pain and the Percocet is not helping.  She has tried to elevate as much as she cannot but she has not been icing it.  Denies any calf pain.  Physical exam shows significant swelling of the foot and ankle.  The incision is intact but there is one area that looks to be superficially dehisced.  The medial incision looks good.  We will keep his sutures in for another week.  In the meantime she is to elevate at all times.  She needs to put ice packs on there.  I have sent in a prescription for Naprosyn for her to take which I think will help more for the swelling and inflammation then just the painkillers.  Cam boot was provided today.  Recheck surgical incisions next week  Follow-Up Instructions: Return in about 1 week (around 04/07/2019).   Orders:  Orders Placed This Encounter  Procedures  . XR Ankle Complete Left   Meds ordered this encounter  Medications  . naproxen (NAPROSYN) 500 MG tablet    Sig: Take 1 tablet (500 mg total) by mouth 2 (two) times daily with a meal.    Dispense:  30 tablet    Refill:  3    Imaging: Xr Ankle Complete Left  Result Date: 03/31/2019 Stable fixation of trimalleolar ankle fracture.  No complications.   PMFS History: Patient Active Problem List   Diagnosis Date Noted  . Displaced trimalleolar fracture of left lower leg, subsequent encounter for closed fracture with delayed healing  03/17/2019  . Primary osteoarthritis of right hip 04/08/2017  . History of hip replacement 03/25/2017  . Primary osteoarthritis of left hip 12/22/2016  . Major depressive disorder 10/24/2012  . Smokes tobacco daily 05/13/2010  . Headache(784.0) 05/13/2010  . Microalbuminuria 04/16/2009  . GERD 11/22/2008  . Left hip pain 11/22/2008  . Bipolar 1 disorder, depressed ([place]) 05/17/2007  . Lumbosacral degenerative disk disease 03/23/2005  . Essential hypertension, benign 08/18/2003   Past Medical History:  Diagnosis Date  . Anemia    low iron during pregnancy  . Anxiety   . Arthritis   . Asthma    as a child  . Bipolar disorder ([place])   . Depression   . GERD (gastroesophageal reflux disease)   . Headache   . Hypertension   . Left trimalleolar fracture   . Pneumonia    as a child  . PONV (postoperative nausea and vomiting)   . Restless legs     Family History  Problem Relation Age of Onset  . Colon cancer Mother   . HIV Mother   . Colon cancer Sister   . Breast cancer Neg Hx     Past Surgical History:  Procedure Laterality Date  . BILATERAL ANTERIOR TOTAL HIP ARTHROPLASTY Bilateral 03/25/2017  Procedure: BILATERAL ANTERIOR TOTAL HIP ARTHROPLASTY;  Surgeon: Leandrew Koyanagi, MD;  Location: [facility];  Service: Orthopedics;  Laterality: Bilateral;  . DENTAL SURGERY     all teeth extracted  . LAMINECTOMY AND MICRODISCECTOMY LUMBAR SPINE   06/27/2010  . ORIF ANKLE FRACTURE Left 03/17/2019   Procedure: OPEN REDUCTION INTERNAL FIXATION (ORIF) LEFT ANKLE FRACTURE;  Surgeon: Leandrew Koyanagi, MD;  Location: [facility];  Service: Orthopedics;  Laterality: Left;  . TUBAL LIGATION Bilateral    Social History   Occupational History  . Not on file  Tobacco Use  . Smoking status: Current Every Day Smoker    Packs/day: 0.25    Types: Cigarettes  . Smokeless tobacco: Never Used  Substance and Sexual Activity  . Alcohol use: No  . Drug use: No  . Sexual activity: Yes     Birth control/protection: Surgical    Comment: BTL

## 2019-04-07 ENCOUNTER — Ambulatory Visit (INDEPENDENT_AMBULATORY_CARE_PROVIDER_SITE_OTHER): Payer: Medicare Other | Admitting: Orthopaedic Surgery

## 2019-04-07 DIAGNOSIS — S82852D Displaced trimalleolar fracture of left lower leg, subsequent encounter for closed fracture with routine healing: Secondary | ICD-10-CM

## 2019-04-07 MED ORDER — OXYCODONE-ACETAMINOPHEN 5-325 MG PO TABS: 1.0000 | ORAL_TABLET | Freq: Two times a day (BID) | ORAL | 0 refills | Status: DC | PRN

## 2019-04-07 MED ORDER — CYCLOBENZAPRINE HCL 5 MG PO TABS: 5.0000 mg | ORAL_TABLET | Freq: Three times a day (TID) | ORAL | 3 refills | Status: DC | PRN

## 2019-04-07 NOTE — Progress Notes (Signed)
Post-Op Visit Note   Patient: Patricia Davila           Date of Birth: 1971-03-28           MRN: [id number] Visit Date: 04/07/2019 PCP: Clent Demark, PA-C   Assessment & Plan:  Chief Complaint:  Chief Complaint  Patient presents with  . Left Ankle - Follow-up   Visit Diagnoses:  1. Closed displaced trimalleolar fracture of left ankle with routine healing, subsequent encounter     Plan: [name] is 3 weeks status post ORIF left trimalleolar ankle fracture.  She returns today for wound check.  Last week she had a lot of swelling and I was concerned that the incision was going to dehisce so we kept the sutures in this past week and told her to elevate and ice at all times.  Today the incisions look significantly better and they look healed.  The swelling has gone down significantly.  There is no evidence of any wound dehiscence.  We remove the sutures today and placed Steri-Strips.  She may weight-bear to the operative extremity for transfers only with a walker otherwise she needs to be nonweightbearing.  Percocet and Flexeril prescribed today.  Recheck in 3 weeks with three-view x-rays of the left ankle.  Follow-Up Instructions: Return in about 3 weeks (around 04/28/2019).   Orders:  No orders of the defined types were placed in this encounter.  Meds ordered this encounter  Medications  . oxyCODONE-acetaminophen (PERCOCET) 5-325 MG tablet    Sig: Take 1-2 tablets by mouth 2 (two) times daily as needed for severe pain.    Dispense:  20 tablet    Refill:  0  . cyclobenzaprine (FLEXERIL) 5 MG tablet    Sig: Take 1-2 tablets (5-10 mg total) by mouth 3 (three) times daily as needed for muscle spasms.    Dispense:  30 tablet    Refill:  3    Imaging: No results found.  PMFS History: Patient Active Problem List   Diagnosis Date Noted  . Displaced trimalleolar fracture of left lower leg, subsequent encounter for closed fracture with delayed healing 03/17/2019  . Primary  osteoarthritis of right hip 04/08/2017  . History of hip replacement 03/25/2017  . Primary osteoarthritis of left hip 12/22/2016  . Major depressive disorder 10/24/2012  . Smokes tobacco daily 05/13/2010  . Headache(784.0) 05/13/2010  . Microalbuminuria 04/16/2009  . GERD 11/22/2008  . Left hip pain 11/22/2008  . Bipolar 1 disorder, depressed ([place]) 05/17/2007  . Lumbosacral degenerative disk disease 03/23/2005  . Essential hypertension, benign 08/18/2003   Past Medical History:  Diagnosis Date  . Anemia    low iron during pregnancy  . Anxiety   . Arthritis   . Asthma    as a child  . Bipolar disorder ([place])   . Depression   . GERD (gastroesophageal reflux disease)   . Headache   . Hypertension   . Left trimalleolar fracture   . Pneumonia    as a child  . PONV (postoperative nausea and vomiting)   . Restless legs     Family History  Problem Relation Age of Onset  . Colon cancer Mother   . HIV Mother   . Colon cancer Sister   . Breast cancer Neg Hx     Past Surgical History:  Procedure Laterality Date  . BILATERAL ANTERIOR TOTAL HIP ARTHROPLASTY Bilateral 03/25/2017   Procedure: BILATERAL ANTERIOR TOTAL HIP ARTHROPLASTY;  Surgeon: Leandrew Koyanagi, MD;  Location:  [facility];  Service: Orthopedics;  Laterality: Bilateral;  . DENTAL SURGERY     all teeth extracted  . LAMINECTOMY AND MICRODISCECTOMY LUMBAR SPINE   06/27/2010  . ORIF ANKLE FRACTURE Left 03/17/2019   Procedure: OPEN REDUCTION INTERNAL FIXATION (ORIF) LEFT ANKLE FRACTURE;  Surgeon: Leandrew Koyanagi, MD;  Location: [facility];  Service: Orthopedics;  Laterality: Left;  . TUBAL LIGATION Bilateral    Social History   Occupational History  . Not on file  Tobacco Use  . Smoking status: Current Every Day Smoker    Packs/day: 0.25    Types: Cigarettes  . Smokeless tobacco: Never Used  Substance and Sexual Activity  . Alcohol use: No  . Drug use: No  . Sexual activity: Yes    Birth control/protection:  Surgical    Comment: BTL

## 2019-04-18 ENCOUNTER — Telehealth: Payer: Self-pay | Admitting: Orthopaedic Surgery

## 2019-04-18 ENCOUNTER — Other Ambulatory Visit: Payer: Self-pay | Admitting: Physician Assistant

## 2019-04-18 MED ORDER — HYDROCODONE-ACETAMINOPHEN 5-325 MG PO TABS: 1.0000 | ORAL_TABLET | Freq: Every day | ORAL | 0 refills | Status: DC | PRN

## 2019-04-18 NOTE — Telephone Encounter (Signed)
Patient called requesting refill of Hydrocodone.  Please call patient to advise. [phone]

## 2019-04-18 NOTE — Telephone Encounter (Signed)
Just sent in.  Need to start weaning off so use sparingly

## 2019-04-18 NOTE — Telephone Encounter (Signed)
Please advise 

## 2019-04-28 ENCOUNTER — Ambulatory Visit: Payer: Medicare Other | Admitting: Orthopaedic Surgery

## 2019-05-02 ENCOUNTER — Encounter: Payer: Self-pay | Admitting: Orthopaedic Surgery

## 2019-05-02 ENCOUNTER — Ambulatory Visit (INDEPENDENT_AMBULATORY_CARE_PROVIDER_SITE_OTHER): Payer: Medicare Other | Admitting: Physician Assistant

## 2019-05-02 ENCOUNTER — Other Ambulatory Visit: Payer: Self-pay

## 2019-05-02 ENCOUNTER — Ambulatory Visit (INDEPENDENT_AMBULATORY_CARE_PROVIDER_SITE_OTHER): Payer: Medicare Other

## 2019-05-02 VITALS — Ht 61.5 in | Wt 216.0 lb

## 2019-05-02 DIAGNOSIS — S82852D Displaced trimalleolar fracture of left lower leg, subsequent encounter for closed fracture with routine healing: Secondary | ICD-10-CM

## 2019-05-02 MED ORDER — CYCLOBENZAPRINE HCL 5 MG PO TABS: 5.0000 mg | ORAL_TABLET | Freq: Three times a day (TID) | ORAL | 3 refills | Status: DC | PRN

## 2019-05-02 MED ORDER — NAPROXEN 500 MG PO TABS: 500.0000 mg | ORAL_TABLET | Freq: Two times a day (BID) | ORAL | 3 refills | Status: DC

## 2019-05-02 MED ORDER — OXYCODONE-ACETAMINOPHEN 5-325 MG PO TABS: 1.0000 | ORAL_TABLET | Freq: Two times a day (BID) | ORAL | 0 refills | Status: DC | PRN

## 2019-05-02 NOTE — Addendum Note (Signed)
Addended by: Lendon Collar on: 05/02/2019 03:29 PM   Modules accepted: Orders

## 2019-05-02 NOTE — Progress Notes (Signed)
Post-Op Visit Note   Patient: Patricia Davila           Date of Birth: July 16, 1971           MRN: [id number] Visit Date: 05/02/2019 PCP: Clent Demark, PA-C   Assessment & Plan:  Chief Complaint:  Chief Complaint  Patient presents with  . Left Ankle - Routine Post Op    Left Ankle ORIF trimall fx DOS 03/17/2019   Visit Diagnoses:  1. Closed displaced trimalleolar fracture of left ankle with routine healing, subsequent encounter     Plan: Patient is a pleasant 48 year old female who presents our clinic today 6 weeks status post ORIF left trimalleolar ankle fracture, date of surgery 03/17/2019.  She has been doing fairly well.  She has been compliant nonweightbearing left lower extremity.  She has been icing and elevating the ankle as much as possible.  She has been taking naproxen, hydrocodone and Flexeril with mild relief of symptoms.  No fevers or chills.  Examination of left ankle reveals fully healed surgical incisions.  Moderate tenderness and hypersensitivity to the lateral ankle.  She is able to wiggle her toes.  At this point, her fracture demonstrates enough healing to allow her to be full weightbearing in a cam walker.  We will start her in formal physical therapy and a prescription was provided to her today for this.  She is asked for 1 more prescription of Percocet and I have agreed to do this once more.  She will follow-up with [place] in 6 weeks time for repeat evaluation three-view x-rays of the left ankle.  Call with concerns or questions.  Follow-Up Instructions: Return in about 6 weeks (around 06/13/2019).   Orders:  Orders Placed This Encounter  Procedures  . XR Ankle Complete Left   Meds ordered this encounter  Medications  . oxyCODONE-acetaminophen (PERCOCET) 5-325 MG tablet    Sig: Take 1-2 tablets by mouth 2 (two) times daily as needed for severe pain.    Dispense:  20 tablet    Refill:  0  . naproxen (NAPROSYN) 500 MG tablet    Sig: Take 1 tablet (500 mg total)  by mouth 2 (two) times daily with a meal.    Dispense:  30 tablet    Refill:  3  . cyclobenzaprine (FLEXERIL) 5 MG tablet    Sig: Take 1-2 tablets (5-10 mg total) by mouth 3 (three) times daily as needed for muscle spasms.    Dispense:  30 tablet    Refill:  3    Imaging: Xr Ankle Complete Left  Result Date: 05/02/2019 X-rays of the left ankle demonstrate bony consolidation to the fracture site.   PMFS History: Patient Active Problem List   Diagnosis Date Noted  . Displaced trimalleolar fracture of left lower leg, subsequent encounter for closed fracture with delayed healing 03/17/2019  . Primary osteoarthritis of right hip 04/08/2017  . History of hip replacement 03/25/2017  . Primary osteoarthritis of left hip 12/22/2016  . Major depressive disorder 10/24/2012  . Smokes tobacco daily 05/13/2010  . Headache(784.0) 05/13/2010  . Microalbuminuria 04/16/2009  . GERD 11/22/2008  . Left hip pain 11/22/2008  . Bipolar 1 disorder, depressed ([place]) 05/17/2007  . Lumbosacral degenerative disk disease 03/23/2005  . Essential hypertension, benign 08/18/2003   Past Medical History:  Diagnosis Date  . Anemia    low iron during pregnancy  . Anxiety   . Arthritis   . Asthma    as a child  .  Bipolar disorder ([place])   . Depression   . GERD (gastroesophageal reflux disease)   . Headache   . Hypertension   . Left trimalleolar fracture   . Pneumonia    as a child  . PONV (postoperative nausea and vomiting)   . Restless legs     Family History  Problem Relation Age of Onset  . Colon cancer Mother   . HIV Mother   . Colon cancer Sister   . Breast cancer Neg Hx     Past Surgical History:  Procedure Laterality Date  . BILATERAL ANTERIOR TOTAL HIP ARTHROPLASTY Bilateral 03/25/2017   Procedure: BILATERAL ANTERIOR TOTAL HIP ARTHROPLASTY;  Surgeon: Leandrew Koyanagi, MD;  Location: [place];  Service: Orthopedics;  Laterality: Bilateral;  . DENTAL SURGERY     all teeth extracted  .  LAMINECTOMY AND MICRODISCECTOMY LUMBAR SPINE   06/27/2010  . ORIF ANKLE FRACTURE Left 03/17/2019   Procedure: OPEN REDUCTION INTERNAL FIXATION (ORIF) LEFT ANKLE FRACTURE;  Surgeon: Leandrew Koyanagi, MD;  Location: [place];  Service: Orthopedics;  Laterality: Left;  . TUBAL LIGATION Bilateral    Social History   Occupational History  . Not on file  Tobacco Use  . Smoking status: Current Every Day Smoker    Packs/day: 0.25    Types: Cigarettes  . Smokeless tobacco: Never Used  Substance and Sexual Activity  . Alcohol use: No  . Drug use: No  . Sexual activity: Yes    Birth control/protection: Surgical    Comment: BTL

## 2019-05-15 ENCOUNTER — Telehealth: Payer: Self-pay | Admitting: Orthopaedic Surgery

## 2019-05-15 NOTE — Telephone Encounter (Signed)
See message below °

## 2019-05-15 NOTE — Telephone Encounter (Signed)
Patient called. Would like a refill on her pain meds. Her call back number is [phone]

## 2019-05-16 ENCOUNTER — Other Ambulatory Visit: Payer: Self-pay | Admitting: Physician Assistant

## 2019-05-16 MED ORDER — HYDROCODONE-ACETAMINOPHEN 5-325 MG PO TABS: 1.0000 | ORAL_TABLET | Freq: Every day | ORAL | 0 refills | Status: DC | PRN

## 2019-05-16 NOTE — Telephone Encounter (Signed)
Sent in.  Last narcotic rx

## 2019-05-16 NOTE — Telephone Encounter (Signed)
Called patient to advise. She is aware.  

## 2019-05-24 DIAGNOSIS — M62572 Muscle wasting and atrophy, not elsewhere classified, left ankle and foot: Secondary | ICD-10-CM | POA: Diagnosis not present

## 2019-05-24 DIAGNOSIS — M25472 Effusion, left ankle: Secondary | ICD-10-CM | POA: Diagnosis not present

## 2019-05-24 DIAGNOSIS — M25672 Stiffness of left ankle, not elsewhere classified: Secondary | ICD-10-CM | POA: Diagnosis not present

## 2019-05-24 DIAGNOSIS — R269 Unspecified abnormalities of gait and mobility: Secondary | ICD-10-CM | POA: Diagnosis not present

## 2019-05-24 DIAGNOSIS — M25572 Pain in left ankle and joints of left foot: Secondary | ICD-10-CM | POA: Diagnosis not present

## 2019-05-26 ENCOUNTER — Telehealth: Payer: Self-pay | Admitting: *Deleted

## 2019-05-26 NOTE — Telephone Encounter (Signed)
Please advise 

## 2019-05-26 NOTE — Telephone Encounter (Signed)
Called and left a VM advising patient of Dr. Xu's message below.   

## 2019-05-26 NOTE — Telephone Encounter (Signed)
Pt states she woke up this morning and noticed her Left leg is swollen from her ankle to her thigh, states she went to therapy on Wednesday and it was fine and then yesterday she noticed it was a little bit swollen but today is worse. Pt had recent surgery to Left ankle on Aug 3rd. Please advise.   Pt cb # [phone]

## 2019-05-26 NOTE — Telephone Encounter (Signed)
That sounds like it's normal for recovery from the ankle surgery

## 2019-06-05 ENCOUNTER — Telehealth: Payer: Self-pay

## 2019-06-05 NOTE — Telephone Encounter (Signed)
Patient left VM on triage phone stating that her left ankle had some drainage, swelling and that her left leg was swollen from her thigh down to the top of her foot.  Patient had Left Ankle surgery on 03/17/2019.  Advised patient that Dr. Erlinda Hong is in surgery all day today and that I could make her an appointment to see another provider. Patient stated that she preferred to see Dr. Erlinda Hong and would like appointment for Tuesday, 06/06/2019.  Appt.scheduled.

## 2019-06-06 ENCOUNTER — Ambulatory Visit (INDEPENDENT_AMBULATORY_CARE_PROVIDER_SITE_OTHER): Payer: Medicare Other | Admitting: Orthopaedic Surgery

## 2019-06-06 ENCOUNTER — Other Ambulatory Visit: Payer: Self-pay

## 2019-06-06 ENCOUNTER — Ambulatory Visit (HOSPITAL_COMMUNITY): Payer: Medicare Other

## 2019-06-06 ENCOUNTER — Ambulatory Visit: Payer: Self-pay

## 2019-06-06 ENCOUNTER — Ambulatory Visit (INDEPENDENT_AMBULATORY_CARE_PROVIDER_SITE_OTHER): Payer: Medicare Other

## 2019-06-06 DIAGNOSIS — S82852D Displaced trimalleolar fracture of left lower leg, subsequent encounter for closed fracture with routine healing: Secondary | ICD-10-CM

## 2019-06-06 DIAGNOSIS — M25552 Pain in left hip: Secondary | ICD-10-CM

## 2019-06-06 DIAGNOSIS — M7989 Other specified soft tissue disorders: Secondary | ICD-10-CM

## 2019-06-06 MED ORDER — MUPIROCIN 2 % EX OINT: 1.0000 "application " | TOPICAL_OINTMENT | Freq: Two times a day (BID) | CUTANEOUS | 0 refills | Status: DC

## 2019-06-06 MED ORDER — HYDROCODONE-ACETAMINOPHEN 5-325 MG PO TABS: 1.0000 | ORAL_TABLET | Freq: Every day | ORAL | 0 refills | Status: DC | PRN

## 2019-06-06 NOTE — Progress Notes (Signed)
Office Visit Note   Patient: Patricia Davila           Date of Birth: Apr 25, 1971           MRN: [id number] Visit Date: 06/06/2019              Requested by: Clent Demark, PA-C No address on file PCP: Clent Demark, PA-C   Assessment & Plan: Visit Diagnoses:  1. Closed displaced trimalleolar fracture of left ankle with routine healing, subsequent encounter   2. Pain in left hip   3. Swelling of left lower extremity     Plan: At this point I would like to obtain a stat Doppler to rule out DVT.  Patient does not want to go today for this and instead wants to do it in the morning but I cautioned her about the risks of waiting in case she does truly have a DVT which could become a PE which could be life-threatening but she understands the risk of waiting until tomorrow.  Her hip x-rays look good and I will see any problems there.  The retained suture was removed today and we placed Bactroban ointment in the wounds with a Band-Aid.  She is to do this twice a day.  We will see her back in 6 weeks with three-view x-rays of her left ankle.  Follow-Up Instructions: Return in about 6 weeks (around 07/18/2019).   Orders:  Orders Placed This Encounter  Procedures  . XR Ankle Complete Left  . XR HIP UNILAT W OR W/O PELVIS 2-3 VIEWS LEFT  . VAS [place] LOWER EXTREMITY VENOUS (DVT)   Meds ordered this encounter  Medications  . HYDROcodone-acetaminophen (NORCO) 5-325 MG tablet    Sig: Take 1-2 tablets by mouth daily as needed.    Dispense:  20 tablet    Refill:  0  . mupirocin ointment (BACTROBAN) 2 %    Sig: Apply 1 application topically 2 (two) times daily.    Dispense:  22 g    Refill:  0      Procedures: No procedures performed   Clinical Data: No additional findings.   Subjective: Chief Complaint  Patient presents with  . Left Ankle - Pain    03/17/2019 ORIF Left Ankle Fracture  . Left Hip - Pain    Patricia Davila is a 48 year old female who is nearly 3 months status post  ORIF left trimalleolar ankle fracture.  She states that she is still having a lot of swelling and pain in her left lower extremity that goes all the way up into her thigh.  Denies any chest pains or shortness of breath.  She is wondering if she injured her left hip when she injured the left ankle.  She has been taking naproxen without much relief. She has only been doing 1 session of outpatient PT.   Review of Systems   Objective: Vital Signs: There were no vitals taken for this visit.  Physical Exam  Ortho Exam She has severe swelling of the entire left lower extremity all the way up to the thigh.  She does have palpable pulses.  She has a very small area of superficial dehiscence of the lateral ankle wound with a retained suture.  Overall she feels tightness with any movement of the ankle with the hip. Specialty Comments:  No specialty comments available.  Imaging: Xr Ankle Complete Left  Result Date: 06/06/2019 Stable fixation of trimalleolar ankle fracture.  Persistent lucency of the fibula.  There  is no complications of the hardware.  Xr Hip Unilat W Or W/o Pelvis 2-3 Views Left  Result Date: 06/06/2019 Stable total hip replacement without complication.    PMFS History: Patient Active Problem List   Diagnosis Date Noted  . Displaced trimalleolar fracture of left lower leg, subsequent encounter for closed fracture with delayed healing 03/17/2019  . Primary osteoarthritis of right hip 04/08/2017  . History of hip replacement 03/25/2017  . Primary osteoarthritis of left hip 12/22/2016  . Major depressive disorder 10/24/2012  . Smokes tobacco daily 05/13/2010  . Headache(784.0) 05/13/2010  . Microalbuminuria 04/16/2009  . GERD 11/22/2008  . Left hip pain 11/22/2008  . Bipolar 1 disorder, depressed ([place]) 05/17/2007  . Lumbosacral degenerative disk disease 03/23/2005  . Essential hypertension, benign 08/18/2003   Past Medical History:  Diagnosis Date  . Anemia    low  iron during pregnancy  . Anxiety   . Arthritis   . Asthma    as a child  . Bipolar disorder ([place])   . Depression   . GERD (gastroesophageal reflux disease)   . Headache   . Hypertension   . Left trimalleolar fracture   . Pneumonia    as a child  . PONV (postoperative nausea and vomiting)   . Restless legs     Family History  Problem Relation Age of Onset  . Colon cancer Mother   . HIV Mother   . Colon cancer Sister   . Breast cancer Neg Hx     Past Surgical History:  Procedure Laterality Date  . BILATERAL ANTERIOR TOTAL HIP ARTHROPLASTY Bilateral 03/25/2017   Procedure: BILATERAL ANTERIOR TOTAL HIP ARTHROPLASTY;  Surgeon: Leandrew Koyanagi, MD;  Location: [place];  Service: Orthopedics;  Laterality: Bilateral;  . DENTAL SURGERY     all teeth extracted  . LAMINECTOMY AND MICRODISCECTOMY LUMBAR SPINE   06/27/2010  . ORIF ANKLE FRACTURE Left 03/17/2019   Procedure: OPEN REDUCTION INTERNAL FIXATION (ORIF) LEFT ANKLE FRACTURE;  Surgeon: Leandrew Koyanagi, MD;  Location: [place];  Service: Orthopedics;  Laterality: Left;  . TUBAL LIGATION Bilateral    Social History   Occupational History  . Not on file  Tobacco Use  . Smoking status: Current Every Day Smoker    Packs/day: 0.25    Types: Cigarettes  . Smokeless tobacco: Never Used  Substance and Sexual Activity  . Alcohol use: No  . Drug use: No  . Sexual activity: Yes    Birth control/protection: Surgical    Comment: BTL

## 2019-06-07 ENCOUNTER — Ambulatory Visit (HOSPITAL_COMMUNITY)
Admission: RE | Admit: 2019-06-07 | Discharge: 2019-06-07 | Disposition: A | Discharge status: Home / Self Care | Payer: Medicare Other | Source: Ambulatory Visit | Attending: Orthopaedic Surgery | Admitting: Orthopaedic Surgery

## 2019-06-07 ENCOUNTER — Inpatient Hospital Stay (HOSPITAL_COMMUNITY)
Admission: EM | Admit: 2019-06-07 | Discharge: 2019-06-10 | DRG: 270 | Disposition: A | Discharge status: Home / Self Care | Payer: Medicare Other | Attending: Family Medicine | Admitting: Family Medicine

## 2019-06-07 ENCOUNTER — Emergency Department (HOSPITAL_COMMUNITY): Payer: Medicare Other

## 2019-06-07 ENCOUNTER — Ambulatory Visit (HOSPITAL_BASED_OUTPATIENT_CLINIC_OR_DEPARTMENT_OTHER)
Admission: RE | Admit: 2019-06-07 | Discharge: 2019-06-07 | Disposition: A | Discharge status: Home / Self Care | Payer: Medicare Other | Source: Ambulatory Visit | Attending: Orthopaedic Surgery | Admitting: Orthopaedic Surgery

## 2019-06-07 ENCOUNTER — Other Ambulatory Visit (HOSPITAL_COMMUNITY): Payer: Self-pay | Admitting: Orthopaedic Surgery

## 2019-06-07 ENCOUNTER — Encounter (HOSPITAL_COMMUNITY): Payer: Self-pay | Admitting: Emergency Medicine

## 2019-06-07 ENCOUNTER — Ambulatory Visit (HOSPITAL_COMMUNITY): Admission: RE | Admit: 2019-06-07 | Payer: Medicare Other | Source: Ambulatory Visit

## 2019-06-07 ENCOUNTER — Other Ambulatory Visit: Payer: Self-pay

## 2019-06-07 DIAGNOSIS — I82402 Acute embolism and thrombosis of unspecified deep veins of left lower extremity: Secondary | ICD-10-CM

## 2019-06-07 DIAGNOSIS — Z885 Allergy status to narcotic agent status: Secondary | ICD-10-CM | POA: Diagnosis not present

## 2019-06-07 DIAGNOSIS — I871 Compression of vein: Secondary | ICD-10-CM | POA: Diagnosis not present

## 2019-06-07 DIAGNOSIS — Z96643 Presence of artificial hip joint, bilateral: Secondary | ICD-10-CM | POA: Diagnosis present

## 2019-06-07 DIAGNOSIS — F17210 Nicotine dependence, cigarettes, uncomplicated: Secondary | ICD-10-CM | POA: Diagnosis present

## 2019-06-07 DIAGNOSIS — Z79899 Other long term (current) drug therapy: Secondary | ICD-10-CM | POA: Diagnosis not present

## 2019-06-07 DIAGNOSIS — Z888 Allergy status to other drugs, medicaments and biological substances status: Secondary | ICD-10-CM | POA: Diagnosis not present

## 2019-06-07 DIAGNOSIS — E876 Hypokalemia: Secondary | ICD-10-CM | POA: Diagnosis not present

## 2019-06-07 DIAGNOSIS — I82412 Acute embolism and thrombosis of left femoral vein: Secondary | ICD-10-CM | POA: Diagnosis not present

## 2019-06-07 DIAGNOSIS — I745 Embolism and thrombosis of iliac artery: Secondary | ICD-10-CM | POA: Diagnosis present

## 2019-06-07 DIAGNOSIS — M7989 Other specified soft tissue disorders: Secondary | ICD-10-CM | POA: Insufficient documentation

## 2019-06-07 DIAGNOSIS — G2581 Restless legs syndrome: Secondary | ICD-10-CM | POA: Diagnosis not present

## 2019-06-07 DIAGNOSIS — Z791 Long term (current) use of non-steroidal anti-inflammatories (NSAID): Secondary | ICD-10-CM

## 2019-06-07 DIAGNOSIS — D649 Anemia, unspecified: Secondary | ICD-10-CM | POA: Diagnosis present

## 2019-06-07 DIAGNOSIS — Z23 Encounter for immunization: Secondary | ICD-10-CM

## 2019-06-07 DIAGNOSIS — Z79891 Long term (current) use of opiate analgesic: Secondary | ICD-10-CM | POA: Diagnosis not present

## 2019-06-07 DIAGNOSIS — Z7982 Long term (current) use of aspirin: Secondary | ICD-10-CM | POA: Diagnosis not present

## 2019-06-07 DIAGNOSIS — I2699 Other pulmonary embolism without acute cor pulmonale: Secondary | ICD-10-CM | POA: Diagnosis not present

## 2019-06-07 DIAGNOSIS — I2602 Saddle embolus of pulmonary artery with acute cor pulmonale: Secondary | ICD-10-CM | POA: Diagnosis not present

## 2019-06-07 DIAGNOSIS — Z800 Family history of malignant neoplasm of digestive organs: Secondary | ICD-10-CM

## 2019-06-07 DIAGNOSIS — I82442 Acute embolism and thrombosis of left tibial vein: Secondary | ICD-10-CM | POA: Diagnosis present

## 2019-06-07 DIAGNOSIS — K219 Gastro-esophageal reflux disease without esophagitis: Secondary | ICD-10-CM | POA: Diagnosis present

## 2019-06-07 DIAGNOSIS — I824Y2 Acute embolism and thrombosis of unspecified deep veins of left proximal lower extremity: Secondary | ICD-10-CM

## 2019-06-07 DIAGNOSIS — M1990 Unspecified osteoarthritis, unspecified site: Secondary | ICD-10-CM | POA: Diagnosis not present

## 2019-06-07 DIAGNOSIS — I10 Essential (primary) hypertension: Secondary | ICD-10-CM | POA: Diagnosis not present

## 2019-06-07 DIAGNOSIS — I82422 Acute embolism and thrombosis of left iliac vein: Principal | ICD-10-CM | POA: Diagnosis present

## 2019-06-07 DIAGNOSIS — R0602 Shortness of breath: Secondary | ICD-10-CM | POA: Diagnosis not present

## 2019-06-07 DIAGNOSIS — Z20828 Contact with and (suspected) exposure to other viral communicable diseases: Secondary | ICD-10-CM | POA: Diagnosis present

## 2019-06-07 LAB — BASIC METABOLIC PANEL
Anion gap: 10 (ref 5–15)
BUN: 15 mg/dL (ref 6–20)
CO2: 26 mmol/L (ref 22–32)
Calcium: 8.9 mg/dL (ref 8.9–10.3)
Chloride: 104 mmol/L (ref 98–111)
Creatinine, Ser: 1.03 mg/dL — ABNORMAL HIGH (ref 0.44–1.00)
GFR calc Af Amer: >60 mL/min (ref >60)
GFR calc non Af Amer: >60 mL/min (ref >60)
Glucose, Bld: 93 mg/dL (ref 70–99)
Potassium: 3.5 mmol/L (ref 3.5–5.1)
Sodium: 140 mmol/L (ref 135–145)

## 2019-06-07 LAB — CBC
HCT: 32.7 % — ABNORMAL LOW (ref 36.0–46.0)
Hemoglobin: 11 g/dL — ABNORMAL LOW (ref 12.0–15.0)
MCH: 30 pg (ref 26.0–34.0)
MCHC: 33.6 g/dL (ref 30.0–36.0)
MCV: 89.1 fL (ref 80.0–100.0)
Platelets: 320 10*3/uL (ref 150–400)
RBC: 3.67 MIL/uL — ABNORMAL LOW (ref 3.87–5.11)
RDW: 13.9 % (ref 11.5–15.5)
WBC: 9.1 10*3/uL (ref 4.0–10.5)
nRBC: 0 % (ref 0.0–0.2)

## 2019-06-07 LAB — I-STAT BETA HCG BLOOD, ED (MC, WL, AP ONLY): I-stat hCG, quantitative: <5 m[IU]/mL (ref <5)

## 2019-06-07 LAB — SARS CORONAVIRUS 2 BY RT PCR (HOSPITAL ORDER, PERFORMED IN ~~LOC~~ HOSPITAL LAB): SARS Coronavirus 2: NEGATIVE

## 2019-06-07 MED ORDER — PANTOPRAZOLE SODIUM 40 MG PO TBEC
40.0000 mg | DELAYED_RELEASE_TABLET | Freq: Every day | ORAL | Status: DC
  Administered 2019-06-08 – 2019-06-10 (×3): 40 mg via ORAL
  Filled 2019-06-07 (×3): qty 1

## 2019-06-07 MED ORDER — IOHEXOL 350 MG/ML SOLN
100.0000 mL | Freq: Once | INTRAVENOUS | Status: AC | PRN
  Administered 2019-06-07: 100 mL via INTRAVENOUS

## 2019-06-07 MED ORDER — INFLUENZA VAC SPLIT QUAD 0.5 ML IM SUSY
0.5000 mL | PREFILLED_SYRINGE | INTRAMUSCULAR | Status: AC
  Administered 2019-06-10: 10:00:00 0.5 mL via INTRAMUSCULAR
  Filled 2019-06-07: qty 0.5

## 2019-06-07 MED ORDER — HEPARIN (PORCINE) 25000 UT/250ML-% IV SOLN
1150.0000 [IU]/h | INTRAVENOUS | Status: DC
  Administered 2019-06-07: 23:00:00 1600 [IU]/h via INTRAVENOUS
  Administered 2019-06-08: 12:00:00 1500 [IU]/h via INTRAVENOUS
  Administered 2019-06-09: 10:00:00 1150 [IU]/h via INTRAVENOUS
  Filled 2019-06-07 (×3): qty 250

## 2019-06-07 MED ORDER — LOSARTAN POTASSIUM 50 MG PO TABS
50.0000 mg | ORAL_TABLET | Freq: Every day | ORAL | Status: DC
  Administered 2019-06-08 – 2019-06-10 (×3): 50 mg via ORAL
  Filled 2019-06-07 (×3): qty 1

## 2019-06-07 MED ORDER — METHOCARBAMOL 750 MG PO TABS
750.0000 mg | ORAL_TABLET | Freq: Two times a day (BID) | ORAL | Status: DC | PRN
  Administered 2019-06-08: 750 mg via ORAL
  Filled 2019-06-07: qty 1

## 2019-06-07 MED ORDER — ACETAMINOPHEN 650 MG RE SUPP
650.0000 mg | Freq: Four times a day (QID) | RECTAL | Status: DC | PRN
  Administered 2019-06-09: 650 mg via RECTAL

## 2019-06-07 MED ORDER — SODIUM CHLORIDE 0.9% FLUSH
3.0000 mL | Freq: Once | INTRAVENOUS | Status: AC
  Administered 2019-06-07: 3 mL via INTRAVENOUS

## 2019-06-07 MED ORDER — HYDROCODONE-ACETAMINOPHEN 5-325 MG PO TABS
1.0000 | ORAL_TABLET | Freq: Every day | ORAL | Status: DC | PRN
  Administered 2019-06-07 – 2019-06-10 (×4): 1 via ORAL
  Filled 2019-06-07 (×4): qty 1

## 2019-06-07 MED ORDER — ONDANSETRON HCL 4 MG/2ML IJ SOLN: 4.0000 mg | Freq: Four times a day (QID) | INTRAMUSCULAR | Status: DC | PRN

## 2019-06-07 MED ORDER — ONDANSETRON HCL 4 MG PO TABS: 4.0000 mg | ORAL_TABLET | Freq: Four times a day (QID) | ORAL | Status: DC | PRN

## 2019-06-07 MED ORDER — SODIUM CHLORIDE 0.9 % IV BOLUS
500.0000 mL | Freq: Once | INTRAVENOUS | Status: AC
  Administered 2019-06-07: 500 mL via INTRAVENOUS

## 2019-06-07 MED ORDER — ASPIRIN EC 81 MG PO TBEC: 81.0000 mg | DELAYED_RELEASE_TABLET | Freq: Two times a day (BID) | ORAL | Status: DC

## 2019-06-07 MED ORDER — ACETAMINOPHEN 325 MG PO TABS
650.0000 mg | ORAL_TABLET | Freq: Four times a day (QID) | ORAL | Status: DC | PRN
  Filled 2019-06-07: qty 2

## 2019-06-07 MED ORDER — HEPARIN BOLUS VIA INFUSION
5000.0000 [IU] | Freq: Once | INTRAVENOUS | Status: AC
  Administered 2019-06-07: 5000 [IU] via INTRAVENOUS
  Filled 2019-06-07: qty 5000

## 2019-06-07 MED ORDER — HYDROCHLOROTHIAZIDE 25 MG PO TABS
25.0000 mg | ORAL_TABLET | Freq: Every day | ORAL | Status: DC
  Administered 2019-06-08 – 2019-06-10 (×3): 25 mg via ORAL
  Filled 2019-06-07 (×3): qty 1

## 2019-06-07 NOTE — H&P (Signed)
History and Physical    Patricia Davila [id number] DOB: 1971-02-03 DOA: 06/07/2019  PCP: Clent Demark, PA-C  Patient coming from: Home.  Chief Complaint: Left leg swelling.  HPI: Patricia Davila is a 48 y.o. female with history of hypertension who has had recent left ankle surgery with ORIF in July 2020 was experiencing increasing swelling of the left lower extremity over the past 1 week and patient's orthopedic surgeon ordered Dopplers which showed DVT involving the left iliac to all the way down veins and was instructed to come to the ER.  Patient also stated that she has been having shortness of breath off and on for the last 1 week but denies any chest pain productive cough fever or chills.  Patient states she has not been ambulatory after the surgery much.  ED Course: In the ER Dopplers confirm DVT involving the left lower extremity up from the iliac vein going all the way down.  On exam patient has significant swelling of the left lower extremity.  CT angiogram of the chest confirms pulmonary embolism with no strain pattern.  EKG shows normal sinus rhythm.  Labs show creatinine of 1 hemoglobin 11 platelets 320 pregnancy screen were negative.  On-call vascular surgeon Dr. Donnetta Hutching was consulted and will be seeing in consult to see if patient needs any intervention for the DVT.  Patient was started on heparin.  Review of Systems: As per HPI, rest all negative.   Past Medical History:  Diagnosis Date   Anemia    low iron during pregnancy   Anxiety    Arthritis    Asthma    as a child   Bipolar disorder ([place])    Depression    GERD (gastroesophageal reflux disease)    Headache    Hypertension    Left trimalleolar fracture    Pneumonia    as a child   PONV (postoperative nausea and vomiting)    Restless legs     Past Surgical History:  Procedure Laterality Date   BILATERAL ANTERIOR TOTAL HIP ARTHROPLASTY Bilateral 03/25/2017   Procedure: BILATERAL ANTERIOR TOTAL  HIP ARTHROPLASTY;  Surgeon: Leandrew Koyanagi, MD;  Location: [facility];  Service: Orthopedics;  Laterality: Bilateral;   DENTAL SURGERY     all teeth extracted   LAMINECTOMY AND MICRODISCECTOMY LUMBAR SPINE   06/27/2010   ORIF ANKLE FRACTURE Left 03/17/2019   Procedure: OPEN REDUCTION INTERNAL FIXATION (ORIF) LEFT ANKLE FRACTURE;  Surgeon: Leandrew Koyanagi, MD;  Location: [facility];  Service: Orthopedics;  Laterality: Left;   TUBAL LIGATION Bilateral      reports that she has been smoking cigarettes. She has been smoking about 0.25 packs per day. She has never used smokeless tobacco. She reports that she does not drink alcohol or use drugs.  Allergies  Allergen Reactions   Ativan [Lorazepam] Anxiety    HALLUCINATIONS   Tylenol With Codeine #3 [Acetaminophen-Codeine] Hives and Itching    Family History  Problem Relation Age of Onset   Colon cancer Mother    HIV Mother    Colon cancer Sister    Breast cancer Neg Hx     Prior to Admission medications   Medication Sig Start Date End Date Taking? Authorizing Provider  aspirin EC 81 MG tablet Take 1 tablet (81 mg total) by mouth 2 (two) times daily. 03/17/19  Yes Leandrew Koyanagi, MD  calcium-vitamin D (OSCAL WITH D) 500-200 MG-UNIT tablet Take 1 tablet by mouth 3 (three)  times daily. 03/17/19  Yes Leandrew Koyanagi, MD  Cholecalciferol (VITAMIN D) 50 MCG (2000 UT) tablet Take 1 tablet (2,000 Units total) by mouth daily. 03/06/19  Yes Kerin Perna, NP  cyclobenzaprine (FLEXERIL) 5 MG tablet Take 1-2 tablets (5-10 mg total) by mouth 3 (three) times daily as needed for muscle spasms. 05/02/19  Yes Aundra Dubin, PA-C  hydrochlorothiazide (HYDRODIURIL) 25 MG tablet Take 1 tablet (25 mg total) by mouth daily. Take on tablet in the morning. 03/06/19  Yes Kerin Perna, NP  HYDROcodone-acetaminophen (NORCO) 5-325 MG tablet Take 1 tablet by mouth daily as needed for moderate pain. 05/16/19  Yes Aundra Dubin, PA-C  losartan  (COZAAR) 50 MG tablet Take 1 tablet (50 mg total) by mouth daily. 03/06/19  Yes Kerin Perna, NP  methocarbamol (ROBAXIN) 750 MG tablet Take 1 tablet (750 mg total) by mouth 2 (two) times daily as needed for muscle spasms. 03/17/19  Yes Leandrew Koyanagi, MD  mupirocin ointment (BACTROBAN) 2 % Apply 1 application topically 2 (two) times daily. 06/06/19  Yes Leandrew Koyanagi, MD  naproxen (NAPROSYN) 500 MG tablet Take 1 tablet (500 mg total) by mouth 2 (two) times daily with a meal. 05/02/19  Yes Aundra Dubin, PA-C  omeprazole (PRILOSEC) 40 MG capsule Take 1 capsule (40 mg total) by mouth daily. 03/06/19  Yes Kerin Perna, NP    Physical Exam: Constitutional: Moderately built and nourished. Vitals:   06/07/19 2111 06/07/19 2157 06/07/19 2159 06/07/19 2243  BP:  (!) 154/84  (!) 110/96  Pulse:  95  72  Resp:  12    Temp:    98 F (36.7 C)  TempSrc:    Oral  SpO2:  100%  100%  Weight: 101 kg  101 kg 100 kg  Height:    5\' 1"  (1.549 m)   Eyes: Anicteric no pallor. ENMT: No discharge from the ears eyes nose or mouth. Neck: No mass felt.  No neck rigidity. Respiratory: No rhonchi or crepitations. Cardiovascular: S1-S2 heard. Abdomen: Soft nontender bowel sound present. Musculoskeletal: Swelling and edema of the left lower extremity from the thighs to the foot. Skin: Chronic skin changes. Neurologic: Alert awake oriented to time place and person.  Moves all extremities. Psychiatric: Appears normal.   Labs on Admission: I have personally reviewed following labs and imaging studies  CBC: Recent Labs  Lab 06/07/19 1531  WBC 9.1  HGB 11.0*  HCT 32.7*  MCV 89.1  PLT 99991111   Basic Metabolic Panel: Recent Labs  Lab 06/07/19 1531  NA 140  K 3.5  CL 104  CO2 26  GLUCOSE 93  BUN 15  CREATININE 1.03*  CALCIUM 8.9   GFR: Estimated Creatinine Clearance: 72.4 mL/min (A) (by C-G formula based on SCr of 1.03 mg/dL (H)). Liver Function Tests: No results for input(s): AST, ALT,  ALKPHOS, BILITOT, PROT, ALBUMIN in the last 168 hours. No results for input(s): LIPASE, AMYLASE in the last 168 hours. No results for input(s): AMMONIA in the last 168 hours. Coagulation Profile: No results for input(s): INR, PROTIME in the last 168 hours. Cardiac Enzymes: No results for input(s): CKTOTAL, CKMB, CKMBINDEX, TROPONINI in the last 168 hours. BNP (last 3 results) No results for input(s): PROBNP in the last 8760 hours. HbA1C: No results for input(s): HGBA1C in the last 72 hours. CBG: No results for input(s): GLUCAP in the last 168 hours. Lipid Profile: No results for input(s): CHOL, HDL, LDLCALC, TRIG, CHOLHDL, LDLDIRECT  in the last 72 hours. Thyroid Function Tests: No results for input(s): TSH, T4TOTAL, FREET4, T3FREE, THYROIDAB in the last 72 hours. Anemia Panel: No results for input(s): VITAMINB12, FOLATE, FERRITIN, TIBC, IRON, RETICCTPCT in the last 72 hours. Urine analysis:    Component Value Date/Time   COLORURINE YELLOW 03/23/2017 [place] 03/23/2017 1235   LABSPEC 1.019 03/23/2017 1235   PHURINE 5.0 03/23/2017 1235   GLUCOSEU NEGATIVE 03/23/2017 1235   HGBUR NEGATIVE 03/23/2017 1235   HGBUR negative 05/13/2010 1036   BILIRUBINUR NEGATIVE 03/23/2017 1235   KETONESUR NEGATIVE 03/23/2017 1235   PROTEINUR NEGATIVE 03/23/2017 1235   UROBILINOGEN 0.2 04/21/2013 0039   NITRITE NEGATIVE 03/23/2017 1235   LEUKOCYTESUR NEGATIVE 03/23/2017 1235   Sepsis Labs: @LABRCNTIP (procalcitonin:4,lacticidven:4) ) Recent Results (from the past 240 hour(s))  SARS Coronavirus 2 by RT PCR (hospital order, performed in [place] hospital lab) Nasopharyngeal Nasopharyngeal Swab     Status: None   Collection Time: 06/07/19  9:06 PM   Specimen: Nasopharyngeal Swab  Result Value Ref Range Status   SARS Coronavirus 2 NEGATIVE NEGATIVE Final    Comment: (NOTE) If result is NEGATIVE SARS-CoV-2 target nucleic acids are NOT DETECTED. The SARS-CoV-2 RNA is generally  detectable in upper and lower  respiratory specimens during the acute phase of infection. The lowest  concentration of SARS-CoV-2 viral copies this assay can detect is 250  copies / mL. A negative result does not preclude SARS-CoV-2 infection  and should not be used as the sole basis for treatment or other  patient management decisions.  A negative result may occur with  improper specimen collection / handling, submission of specimen other  than nasopharyngeal swab, presence of viral mutation(s) within the  areas targeted by this assay, and inadequate number of viral copies  (<250 copies / mL). A negative result must be combined with clinical  observations, patient history, and epidemiological information. If result is POSITIVE SARS-CoV-2 target nucleic acids are DETECTED. The SARS-CoV-2 RNA is generally detectable in upper and lower  respiratory specimens dur ing the acute phase of infection.  Positive  results are indicative of active infection with SARS-CoV-2.  Clinical  correlation with patient history and other diagnostic information is  necessary to determine patient infection status.  Positive results do  not rule out bacterial infection or co-infection with other viruses. If result is PRESUMPTIVE POSTIVE SARS-CoV-2 nucleic acids MAY BE PRESENT.   A presumptive positive result was obtained on the submitted specimen  and confirmed on repeat testing.  While 2019 novel coronavirus  (SARS-CoV-2) nucleic acids may be present in the submitted sample  additional confirmatory testing may be necessary for epidemiological  and / or clinical management purposes  to differentiate between  SARS-CoV-2 and other Sarbecovirus currently known to infect humans.  If clinically indicated additional testing with an alternate test  methodology [phone]) is advised. The SARS-CoV-2 RNA is generally  detectable in upper and lower respiratory sp ecimens during the acute  phase of infection. The  expected result is Negative. Fact Sheet for Patients:  StrictlyIdeas.no Fact Sheet for Healthcare Providers: BankingDealers.co.za This test is not yet approved or cleared by the [place] FDA and has been authorized for detection and/or diagnosis of SARS-CoV-2 by FDA under an Emergency Use Authorization (EUA).  This EUA will remain in effect (meaning this test can be used) for the duration of the COVID-19 declaration under Section 564(b)(1) of the Act, 21 U.S.C. section 360bbb-3(b)(1), unless the authorization is terminated or revoked  sooner. Performed at [facility], [place] [address] [place], [place] [address]      Radiological Exams on Admission: Dg Chest 2 View  Result Date: 06/07/2019 CLINICAL DATA:  Shortness of breath EXAM: CHEST - 2 VIEW COMPARISON:  June 29, 2015. FINDINGS: Lungs are clear. Heart size and pulmonary vascularity are normal. No adenopathy. No bone lesions. IMPRESSION: No edema or consolidation. Electronically Signed   By: Lowella Grip III M.D.   On: 06/07/2019 16:12   Ct Angio Chest Pe W And/or Wo Contrast  Result Date: 06/07/2019 CLINICAL DATA:  Known clots in the leg, short of breath EXAM: CT ANGIOGRAPHY CHEST WITH CONTRAST TECHNIQUE: Multidetector CT imaging of the chest was performed using the standard protocol during bolus administration of intravenous contrast. Multiplanar CT image reconstructions and MIPs were obtained to evaluate the vascular anatomy. CONTRAST:  173mL OMNIPAQUE IOHEXOL 350 MG/ML SOLN COMPARISON:  Chest x-ray 06/07/2019 FINDINGS: Cardiovascular: Satisfactory opacification of the pulmonary arteries to the segmental level. Acute thrombus within right inter lobar pulmonary artery, with extension of thrombus into right lower lobe segmental and subsegmental vessels. RV LV ratio is non elevated. Nonaneurysmal aorta. No dissection seen. Borderline to mild cardiomegaly. No pericardial  effusion. Mediastinum/Nodes: No enlarged mediastinal, hilar, or axillary lymph nodes. Thyroid gland, trachea, and esophagus demonstrate no significant findings. Lungs/Pleura: Lungs are clear. No pleural effusion or pneumothorax. Upper Abdomen: No acute abnormality. Musculoskeletal: No chest wall abnormality. No acute or significant osseous findings. Review of the MIP images confirms the above findings. IMPRESSION: Positive for acute pulmonary embolus involving right descending pulmonary artery and right lower lobe segmental and subsegmental vessels. Negative for right heart strain with non elevated RV LV ratio. Clear lung fields \ Critical Value/emergent results were called by telephone at the time of interpretation on 06/07/2019 at 8:41 pm to providerANKIT NANAVATI , who verbally acknowledged these results. Electronically Signed   By: Donavan Foil M.D.   On: 06/07/2019 20:41   Vas [place] Ivc/iliac (venous Only)  Result Date: 06/07/2019 IVC/ILIAC STUDY Indications: Left lower extremity DVT Other Factors: Left ankle injury and surgery. Limitations: Air/bowel gas.  Comparison Study: No prior study. Performing Technologist: Maudry Mayhew MHA, RDMS, RVT, RDCS  Examination Guidelines: A complete evaluation includes B-mode imaging, spectral Doppler, color Doppler, and power Doppler as needed of all accessible portions of each vessel. Bilateral testing is considered an integral part of a complete examination. Limited examinations for reoccurring indications may be performed as noted.  IVC/Iliac Findings: +----------+------+--------+--------+     IVC     Patent Thrombus Comments  +----------+------+--------+--------+  IVC Distal patent                    +----------+------+--------+--------+  +-------------------+---------+-----------+---------+-----------+--------+          CIV         RT-Patent RT-Thrombus LT-Patent LT-Thrombus Comments  +-------------------+---------+-----------+---------+-----------+--------+   Common Iliac Distal  patent                            acute              +-------------------+---------+-----------+---------+-----------+--------+    Summary: IVC/Iliac: There is no evidence of thrombus involving the IVC. There is no evidence of thrombus involving the right common iliac vein. There is evidence of acute thrombus involving the left common iliac vein.  *See table(s) above for measurements and observations.  Electronically signed by Curt Jews MD on 06/07/2019 at 3:49:29 PM.  Final    Xr Ankle Complete Left  Result Date: 06/06/2019 Stable fixation of trimalleolar ankle fracture.  Persistent lucency of the fibula.  There is no complications of the hardware.  Xr Hip Unilat W Or W/o Pelvis 2-3 Views Left  Result Date: 06/06/2019 Stable total hip replacement without complication.  Vas [place] Lower Extremity Venous (dvt)  Result Date: 06/07/2019  Lower Venous Study Indications: Swelling, and s/p left ankle injury and surgery x approximately 2 weeks.  Performing Technologist: Maudry Mayhew MHA, RDMS, RVT, RDCS  Examination Guidelines: A complete evaluation includes B-mode imaging, spectral Doppler, color Doppler, and power Doppler as needed of all accessible portions of each vessel. Bilateral testing is considered an integral part of a complete examination. Limited examinations for reoccurring indications may be performed as noted.  +-----+---------------+---------+-----------+----------+--------------+  RIGHT Compressibility Phasicity Spontaneity Properties Thrombus Aging  +-----+---------------+---------+-----------+----------+--------------+  CFV   Full            Yes       Yes                                    +-----+---------------+---------+-----------+----------+--------------+  +---------+---------------+---------+-----------+----------+--------------+  LEFT      Compressibility Phasicity Spontaneity Properties Thrombus Aging   +---------+---------------+---------+-----------+----------+--------------+  CFV       None                      No                     Acute           +---------+---------------+---------+-----------+----------+--------------+  SFJ       None                                             Acute           +---------+---------------+---------+-----------+----------+--------------+  FV Prox   None                                             Acute           +---------+---------------+---------+-----------+----------+--------------+  FV Mid    None                                             Acute           +---------+---------------+---------+-----------+----------+--------------+  FV Distal                                                  Not visualized  +---------+---------------+---------+-----------+----------+--------------+  POP       None                      No                     Acute           +---------+---------------+---------+-----------+----------+--------------+  PTV  None                                             Acute           +---------+---------------+---------+-----------+----------+--------------+  PERO      None                                             Acute           +---------+---------------+---------+-----------+----------+--------------+  EIV                                 No                     Acute           +---------+---------------+---------+-----------+----------+--------------+  CIV                                 No                     Acute           +---------+---------------+---------+-----------+----------+--------------+  Summary: Right: No evidence of common femoral vein obstruction. Left: Findings consistent with acute deep vein thrombosis involving the left common iliac vein, left external iliac vein, left common femoral vein, left femoral vein, left popliteal vein, left posterior tibial veins, and left peroneal veins. No cystic structure found in the popliteal  fossa.  *See table(s) above for measurements and observations. Electronically signed by Curt Jews MD on 06/07/2019 at 3:50:46 PM.    Final     EKG: Independently reviewed.  Normal sinus rhythm.  Assessment/Plan Principal Problem:   Pulmonary embolism (HCC) Active Problems:   Essential hypertension, benign   Left leg DVT ([place])    1. Pulmonary embolism with left lower extremity DVT with CAT scan showing no strain pattern patient hemodynamically stable likely provoked from recent surgery -patient has been started on heparin will continue.  Dr. Donnetta Hutching, vascular surgeon has been consulted to get further input about the left lower acute DVT.  Check 2D echo.  Troponin pending. 2. Hypertension on ARB and hydrochlorothiazide. 3. Tobacco abuse advised about quitting. 4. Recent left ORIF with nonhealing wound being followed by orthopedic surgery.  Since patient has extensive DVT and has pulmonary embolism will need more than 2 midnight stay and will need inpatient status.   DVT prophylaxis: Heparin. Code Status: Full code. Family Communication: Discussed with patient. Disposition Plan: Home. Consults called: Vascular surgery. Admission status: Inpatient.   Rise Patience MD [facility] Pager [phone].  If 7PM-7AM, please contact night-coverage [email or web address] Password [email or web address]  06/07/2019, 11:16 PM

## 2019-06-07 NOTE — ED Notes (Signed)
Attempted to call report to floor, RN unavailable. Will attempt call back in 5 to 10 minutes

## 2019-06-07 NOTE — Progress Notes (Signed)
Left lower extremity venous duplex and IVC/Iliac vein duplex completed. Refer to "CV Proc" under chart review to view preliminary results.  06/07/2019 2:57 PM Maudry Mayhew, MHA, RVT, RDCS, RDMS

## 2019-06-07 NOTE — ED Triage Notes (Signed)
Pt arrives from [place] with reports of DVT in left leg. Endorses headache for a week and mild SOB. Swelling to leg.

## 2019-06-07 NOTE — ED Notes (Signed)
ED TO INPATIENT HANDOFF REPORT  ED Nurse Name and Phone #:  [phone]  S Name/Age/Gender Patricia Davila 48 y.o. female Room/Bed: 012C/012C  Code Status   Code Status: Prior  Home/SNF/Other Home Patient oriented to: self, place, time and situation Is this baseline? Yes   Triage Complete: Triage complete  Chief Complaint DVT IN LT LEG  Triage Note Pt arrives from [place] with reports of DVT in left leg. Endorses headache for a week and mild SOB. Swelling to leg.    Allergies Allergies  Allergen Reactions  . Ativan [Lorazepam] Anxiety    HALLUCINATIONS  . Tylenol With Codeine #3 [Acetaminophen-Codeine] Hives and Itching    Level of Care/Admitting Diagnosis ED Disposition    ED Disposition Condition [facility] Area: [place] [100100]  Level of Care: Telemetry Medical [104]  Covid Evaluation: Asymptomatic Screening Protocol (No Symptoms)  Diagnosis: Pulmonary embolism ([place]) [id number]  Admitting Physician: Rise Patience [phone]  Attending Physician: Rise Patience [phone]  Estimated length of stay: past midnight tomorrow  Certification:: I certify this patient will need inpatient services for at least 2 midnights  PT Class (Do Not Modify): Inpatient [101]  PT Acc Code (Do Not Modify): Private [1]       B Medical/Surgery History Past Medical History:  Diagnosis Date  . Anemia    low iron during pregnancy  . Anxiety   . Arthritis   . Asthma    as a child  . Bipolar disorder ([place])   . Depression   . GERD (gastroesophageal reflux disease)   . Headache   . Hypertension   . Left trimalleolar fracture   . Pneumonia    as a child  . PONV (postoperative nausea and vomiting)   . Restless legs    Past Surgical History:  Procedure Laterality Date  . BILATERAL ANTERIOR TOTAL HIP ARTHROPLASTY Bilateral 03/25/2017   Procedure: BILATERAL ANTERIOR TOTAL HIP ARTHROPLASTY;  Surgeon: Leandrew Koyanagi, MD;  Location: [place];  Service:  Orthopedics;  Laterality: Bilateral;  . DENTAL SURGERY     all teeth extracted  . LAMINECTOMY AND MICRODISCECTOMY LUMBAR SPINE   06/27/2010  . ORIF ANKLE FRACTURE Left 03/17/2019   Procedure: OPEN REDUCTION INTERNAL FIXATION (ORIF) LEFT ANKLE FRACTURE;  Surgeon: Leandrew Koyanagi, MD;  Location: [place];  Service: Orthopedics;  Laterality: Left;  . TUBAL LIGATION Bilateral      A IV Location/Drains/Wounds Patient Lines/Drains/Airways Status   Active Line/Drains/Airways    Name:   Placement date:   Placement time:   Site:   Days:   Peripheral IV 06/07/19 Left Antecubital   06/07/19    1906    Antecubital   less than 1   Incision (Closed) 03/25/17 Hip Left   03/25/17    1427     804   Incision (Closed) 03/25/17 Hip Right   03/25/17    1427     804   Incision (Closed) 03/17/19 Ankle Left   03/17/19    1356     82          Intake/Output Last 24 hours No intake or output data in the 24 hours ending 06/07/19 2205  Labs/Imaging Results for orders placed or performed during the hospital encounter of 06/07/19 (from the past 48 hour(s))  Basic metabolic panel     Status: Abnormal   Collection Time: 06/07/19  3:31 PM  Result Value Ref Range   Sodium 140 135 -  145 mmol/L   Potassium 3.5 3.5 - 5.1 mmol/L   Chloride 104 98 - 111 mmol/L   CO2 26 22 - 32 mmol/L   Glucose, Bld 93 70 - 99 mg/dL   BUN 15 6 - 20 mg/dL   Creatinine, Ser 1.03 (H) 0.44 - 1.00 mg/dL   Calcium 8.9 8.9 - 10.3 mg/dL   GFR calc non Af Amer >60 >60 mL/min   GFR calc Af Amer >60 >60 mL/min   Anion gap 10 5 - 15    Comment: Performed at [facility] [address], [place], [place] [address]  CBC     Status: Abnormal   Collection Time: 06/07/19  3:31 PM  Result Value Ref Range   WBC 9.1 4.0 - 10.5 K/uL   RBC 3.67 (L) 3.87 - 5.11 MIL/uL   Hemoglobin 11.0 (L) 12.0 - 15.0 g/dL   HCT 32.7 (L) 36.0 - 46.0 %   MCV 89.1 80.0 - 100.0 fL   MCH 30.0 26.0 - 34.0 pg   MCHC 33.6 30.0 - 36.0 g/dL   RDW  13.9 11.5 - 15.5 %   Platelets 320 150 - 400 K/uL   nRBC 0.0 0.0 - 0.2 %    Comment: Performed at [facility], [place] [address] [place], [place] [address]  I-Stat beta hCG blood, ED     Status: None   Collection Time: 06/07/19  3:35 PM  Result Value Ref Range   I-stat hCG, quantitative <5.0 <5 mIU/mL   Comment 3            Comment:   GEST. AGE      CONC.  (mIU/mL)   <=1 WEEK        5 - 50     2 WEEKS       50 - 500     3 WEEKS       100 - 10,000     4 WEEKS     1,000 - 30,000        FEMALE AND NON-PREGNANT FEMALE:     LESS THAN 5 mIU/mL    Dg Chest 2 View  Result Date: 06/07/2019 CLINICAL DATA:  Shortness of breath EXAM: CHEST - 2 VIEW COMPARISON:  June 29, 2015. FINDINGS: Lungs are clear. Heart size and pulmonary vascularity are normal. No adenopathy. No bone lesions. IMPRESSION: No edema or consolidation. Electronically Signed   By: Lowella Grip III M.D.   On: 06/07/2019 16:12   Ct Angio Chest Pe W And/or Wo Contrast  Result Date: 06/07/2019 CLINICAL DATA:  Known clots in the leg, short of breath EXAM: CT ANGIOGRAPHY CHEST WITH CONTRAST TECHNIQUE: Multidetector CT imaging of the chest was performed using the standard protocol during bolus administration of intravenous contrast. Multiplanar CT image reconstructions and MIPs were obtained to evaluate the vascular anatomy. CONTRAST:  19mL OMNIPAQUE IOHEXOL 350 MG/ML SOLN COMPARISON:  Chest x-ray 06/07/2019 FINDINGS: Cardiovascular: Satisfactory opacification of the pulmonary arteries to the segmental level. Acute thrombus within right inter lobar pulmonary artery, with extension of thrombus into right lower lobe segmental and subsegmental vessels. RV LV ratio is non elevated. Nonaneurysmal aorta. No dissection seen. Borderline to mild cardiomegaly. No pericardial effusion. Mediastinum/Nodes: No enlarged mediastinal, hilar, or axillary lymph nodes. Thyroid gland, trachea, and esophagus demonstrate no significant findings.  Lungs/Pleura: Lungs are clear. No pleural effusion or pneumothorax. Upper Abdomen: No acute abnormality. Musculoskeletal: No chest wall abnormality. No acute or significant osseous findings. Review of the  MIP images confirms the above findings. IMPRESSION: Positive for acute pulmonary embolus involving right descending pulmonary artery and right lower lobe segmental and subsegmental vessels. Negative for right heart strain with non elevated RV LV ratio. Clear lung fields \ Critical Value/emergent results were called by telephone at the time of interpretation on 06/07/2019 at 8:41 pm to providerANKIT NANAVATI , who verbally acknowledged these results. Electronically Signed   By: Donavan Foil M.D.   On: 06/07/2019 20:41   Vas [place] Ivc/iliac (venous Only)  Result Date: 06/07/2019 IVC/ILIAC STUDY Indications: Left lower extremity DVT Other Factors: Left ankle injury and surgery. Limitations: Air/bowel gas.  Comparison Study: No prior study. Performing Technologist: Maudry Mayhew MHA, RDMS, RVT, RDCS  Examination Guidelines: A complete evaluation includes B-mode imaging, spectral Doppler, color Doppler, and power Doppler as needed of all accessible portions of each vessel. Bilateral testing is considered an integral part of a complete examination. Limited examinations for reoccurring indications may be performed as noted.  IVC/Iliac Findings: +----------+------+--------+--------+    IVC    PatentThrombusComments +----------+------+--------+--------+ IVC Distalpatent                 +----------+------+--------+--------+  +-------------------+---------+-----------+---------+-----------+--------+         CIV        RT-PatentRT-ThrombusLT-PatentLT-ThrombusComments +-------------------+---------+-----------+---------+-----------+--------+ Common Iliac Distal patent                         acute            +-------------------+---------+-----------+---------+-----------+--------+    Summary:  IVC/Iliac: There is no evidence of thrombus involving the IVC. There is no evidence of thrombus involving the right common iliac vein. There is evidence of acute thrombus involving the left common iliac vein.  *See table(s) above for measurements and observations.  Electronically signed by Curt Jews MD on 06/07/2019 at 3:49:29 PM.   Final    Xr Ankle Complete Left  Result Date: 06/06/2019 Stable fixation of trimalleolar ankle fracture.  Persistent lucency of the fibula.  There is no complications of the hardware.  Xr Hip Unilat W Or W/o Pelvis 2-3 Views Left  Result Date: 06/06/2019 Stable total hip replacement without complication.  Vas [place] Lower Extremity Venous (dvt)  Result Date: 06/07/2019  Lower Venous Study Indications: Swelling, and s/p left ankle injury and surgery x approximately 2 weeks.  Performing Technologist: Maudry Mayhew MHA, RDMS, RVT, RDCS  Examination Guidelines: A complete evaluation includes B-mode imaging, spectral Doppler, color Doppler, and power Doppler as needed of all accessible portions of each vessel. Bilateral testing is considered an integral part of a complete examination. Limited examinations for reoccurring indications may be performed as noted.  +-----+---------------+---------+-----------+----------+--------------+ RIGHTCompressibilityPhasicitySpontaneityPropertiesThrombus Aging +-----+---------------+---------+-----------+----------+--------------+ CFV  Full           Yes      Yes                                 +-----+---------------+---------+-----------+----------+--------------+  +---------+---------------+---------+-----------+----------+--------------+ LEFT     CompressibilityPhasicitySpontaneityPropertiesThrombus Aging +---------+---------------+---------+-----------+----------+--------------+ CFV      None                    No                   Acute           +---------+---------------+---------+-----------+----------+--------------+ SFJ      None  Acute          +---------+---------------+---------+-----------+----------+--------------+ FV Prox  None                                         Acute          +---------+---------------+---------+-----------+----------+--------------+ FV Mid   None                                         Acute          +---------+---------------+---------+-----------+----------+--------------+ FV Distal                                             Not visualized +---------+---------------+---------+-----------+----------+--------------+ POP      None                    No                   Acute          +---------+---------------+---------+-----------+----------+--------------+ PTV      None                                         Acute          +---------+---------------+---------+-----------+----------+--------------+ PERO     None                                         Acute          +---------+---------------+---------+-----------+----------+--------------+ EIV                              No                   Acute          +---------+---------------+---------+-----------+----------+--------------+ CIV                              No                   Acute          +---------+---------------+---------+-----------+----------+--------------+  Summary: Right: No evidence of common femoral vein obstruction. Left: Findings consistent with acute deep vein thrombosis involving the left common iliac vein, left external iliac vein, left common femoral vein, left femoral vein, left popliteal vein, left posterior tibial veins, and left peroneal veins. No cystic structure found in the popliteal fossa.  *See table(s) above for measurements and observations. Electronically signed by Curt Jews MD on 06/07/2019 at 3:50:46 PM.    Final     Pending  Labs Unresulted Labs (From admission, onward)    Start     Ordered   06/08/19 0500  Heparin level (unfractionated)  Daily,   R     06/07/19 2158   06/08/19 0500  CBC  Daily,   R     06/07/19 2158   06/07/19 2057  SARS Coronavirus 2 by RT PCR (hospital order, performed in [facility] hospital lab) Nasopharyngeal Nasopharyngeal Swab  (Symptomatic/High Risk of Exposure/Tier 1 Patients Labs with Precautions)  Once,   STAT    Question Answer Comment  Is this test for diagnosis or screening Screening   Symptomatic for COVID-19 as defined by CDC No   Hospitalized for COVID-19 No   Admitted to ICU for COVID-19 No   Previously tested for COVID-19 No   Resident in a congregate (group) care setting No   Employed in healthcare setting No   Pregnant No      06/07/19 2056          Vitals/Pain Today's Vitals   06/07/19 2111 06/07/19 2156 06/07/19 2157 06/07/19 2159  BP:   (!) 154/84   Pulse:   95   Resp:   12   Temp:      SpO2:   100%   Weight: 101 kg   101 kg  PainSc:  (S) 7       Isolation Precautions Airborne and Contact precautions  Medications Medications  heparin bolus via infusion 5,000 Units (has no administration in time range)  heparin ADULT infusion 100 units/mL (25000 units/264mL sodium chloride 0.45%) (has no administration in time range)  sodium chloride flush (NS) 0.9 % injection 3 mL (3 mLs Intravenous Given 06/07/19 1906)  sodium chloride 0.9 % bolus 500 mL (0 mLs Intravenous Stopping Infusion hung by another clincian 06/07/19 2158)  iohexol (OMNIPAQUE) 350 MG/ML injection 100 mL (100 mLs Intravenous Contrast Given 06/07/19 2012)    Mobility walks with person assist Moderate fall risk   Focused Assessments Pulmonary Assessment Handoff:  Lung sounds: Bilateral Breath Sounds: (S) Clear, Diminished L Breath Sounds: (S) Diminished, Clear R Breath Sounds: (S) Diminished, Clear O2 Device: Room Air        R Recommendations: See Admitting Provider  Note  Report given to:   Additional Notes: -

## 2019-06-07 NOTE — ED Notes (Signed)
[place], [place] Daughter   [phone]    Daughter would like pt updates

## 2019-06-07 NOTE — Progress Notes (Signed)
ANTICOAGULATION CONSULT NOTE - Initial Consult  Pharmacy Consult for Heparin Indication: pulmonary embolus  Allergies  Allergen Reactions  . Ativan [Lorazepam] Anxiety    HALLUCINATIONS  . Tylenol With Codeine #3 [Acetaminophen-Codeine] Hives and Itching    Patient Measurements:   Heparin Dosing Weight: 68 kg  Vital Signs: Temp: 98.7 F (37.1 C) (10/21 1519) BP: 143/91 (10/21 1519) Pulse Rate: 71 (10/21 1519)  Labs: Recent Labs    06/07/19 1531  HGB 11.0*  HCT 32.7*  PLT 320  CREATININE 1.03*    CrCl cannot be calculated (Unknown ideal weight.).   Medical History: Past Medical History:  Diagnosis Date  . Anemia    low iron during pregnancy  . Anxiety   . Arthritis   . Asthma    as a child  . Bipolar disorder ([place])   . Depression   . GERD (gastroesophageal reflux disease)   . Headache   . Hypertension   . Left trimalleolar fracture   . Pneumonia    as a child  . PONV (postoperative nausea and vomiting)   . Restless legs    Assessment: 48 year old female who is nearly 3 months status post ORIF left trimalleolar ankle fracture.  She states that she is still having a lot of swelling and pain in her left lower extremity that goes all the way up into her thigh. Venous duplex shows left acute DVT.  Pharmacy consulted for heparin dosing for PE.  Goal of Therapy:  Heparin level 0.3-0.7 units/ml Monitor platelets by anticoagulation protocol: Yes   Plan:  Give 5000 units bolus x 1 Start heparin infusion at 1600 units/hr Check anti-Xa level in 6 - 8 hours and daily while on heparin Continue to monitor H&H and platelets  Alanda Slim, PharmD, [facility] Clinical Pharmacist Please see AMION for all Pharmacists' Contact Phone Numbers 06/07/2019, 9:57 PM

## 2019-06-07 NOTE — Progress Notes (Signed)
I spoke to vascular tech just now and told her to send patient to ED asap for management.

## 2019-06-07 NOTE — ED Notes (Signed)
Patient spouse calling asking for an update on patient he is very worried  Belenda Cruise [phone] or [phone]

## 2019-06-08 ENCOUNTER — Inpatient Hospital Stay (HOSPITAL_COMMUNITY): Payer: Medicare Other

## 2019-06-08 DIAGNOSIS — I2602 Saddle embolus of pulmonary artery with acute cor pulmonale: Secondary | ICD-10-CM

## 2019-06-08 LAB — TROPONIN I (HIGH SENSITIVITY)
Troponin I (High Sensitivity): 5 ng/L (ref <18)
Troponin I (High Sensitivity): 3 ng/L (ref <18)

## 2019-06-08 LAB — BASIC METABOLIC PANEL
Anion gap: 8 (ref 5–15)
BUN: 12 mg/dL (ref 6–20)
CO2: 26 mmol/L (ref 22–32)
Calcium: 8.6 mg/dL — ABNORMAL LOW (ref 8.9–10.3)
Chloride: 106 mmol/L (ref 98–111)
Creatinine, Ser: 0.86 mg/dL (ref 0.44–1.00)
GFR calc Af Amer: >60 mL/min (ref >60)
GFR calc non Af Amer: >60 mL/min (ref >60)
Glucose, Bld: 91 mg/dL (ref 70–99)
Potassium: 2.8 mmol/L — ABNORMAL LOW (ref 3.5–5.1)
Sodium: 140 mmol/L (ref 135–145)

## 2019-06-08 LAB — HEPARIN LEVEL (UNFRACTIONATED)
Heparin Unfractionated: 0.9 IU/mL — ABNORMAL HIGH (ref 0.30–0.70)
Heparin Unfractionated: 0.97 IU/mL — ABNORMAL HIGH (ref 0.30–0.70)
Heparin Unfractionated: 0.79 IU/mL — ABNORMAL HIGH (ref 0.30–0.70)

## 2019-06-08 LAB — CBC
HCT: 31.3 % — ABNORMAL LOW (ref 36.0–46.0)
Hemoglobin: 10.4 g/dL — ABNORMAL LOW (ref 12.0–15.0)
MCH: 29.1 pg (ref 26.0–34.0)
MCHC: 33.2 g/dL (ref 30.0–36.0)
MCV: 87.4 fL (ref 80.0–100.0)
Platelets: 312 10*3/uL (ref 150–400)
RBC: 3.58 MIL/uL — ABNORMAL LOW (ref 3.87–5.11)
RDW: 13.7 % (ref 11.5–15.5)
WBC: 8.6 10*3/uL (ref 4.0–10.5)
nRBC: 0 % (ref 0.0–0.2)

## 2019-06-08 LAB — MAGNESIUM: Magnesium: 1.9 mg/dL (ref 1.7–2.4)

## 2019-06-08 LAB — ECHOCARDIOGRAM COMPLETE
Height: 61 in
Weight: 3526.4 oz

## 2019-06-08 LAB — HIV ANTIBODY (ROUTINE TESTING W REFLEX): HIV Screen 4th Generation wRfx: NONREACTIVE

## 2019-06-08 MED ORDER — POTASSIUM CHLORIDE CRYS ER 20 MEQ PO TBCR
40.0000 meq | EXTENDED_RELEASE_TABLET | Freq: Two times a day (BID) | ORAL | Status: AC
  Administered 2019-06-08 (×2): 40 meq via ORAL
  Filled 2019-06-08 (×2): qty 2

## 2019-06-08 NOTE — Care Management (Signed)
1321 06-08-19 Benefits Check submitted for Xarelto. CM will make patient aware of cost once completed. Bethena Roys, RN,BSN Case Manager [phone]

## 2019-06-08 NOTE — H&P (View-Only) (Signed)
[facility] [place]  Patient name: Patricia Davila MRN: [id number] DOB: 1970/11/03 Sex: female    HPI: Patricia Davila is a 48 y.o. female seen in consultation for extensive left leg DVT and pulmonary embolus.  She had presented yesterday with marked swelling in her leg.  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  Clot extends through the femoral vein, popliteal vein and tibial veins.  She denies any prior history of venous thrombosis.  She reports that her maternal grandmother had DVT at an older age.  She did recently have ankle surgery for ankle fracture approximately 3 months ago.  She presented with leg swelling and shortness of breath.  CT scan confirmed pulmonary embolus.  She is admitted for IV heparin and is being seen to consider more aggressive clearing of her left leg thrombus.  Past Medical History:  Diagnosis Date  . Anemia    low iron during pregnancy  . Anxiety   . Arthritis   . Asthma    as a child  . Bipolar disorder ([place])   . Depression   . GERD (gastroesophageal reflux disease)   . Headache   . Hypertension   . Left trimalleolar fracture   . Pneumonia    as a child  . PONV (postoperative nausea and vomiting)   . Restless legs     Family History  Problem Relation Age of Onset  . Colon cancer Mother   . HIV Mother   . Colon cancer Sister   . Breast cancer Neg Hx     SOCIAL HISTORY: Social History   Tobacco Use  . Smoking status: Current Every Day Smoker    Packs/day: 0.25    Types: Cigarettes  . Smokeless tobacco: Never Used  Substance Use Topics  . Alcohol use: No    Allergies  Allergen Reactions  . Ativan [Lorazepam] Anxiety    HALLUCINATIONS  . Tylenol With Codeine #3 [Acetaminophen-Codeine] Hives and Itching    Current Facility-Administered Medications  Medication Dose Route Frequency Provider Last Rate Last Dose  . acetaminophen (TYLENOL) tablet  650 mg  650 mg Oral Q6H PRN Rise Patience, MD       Or  . acetaminophen (TYLENOL) suppository 650 mg  650 mg Rectal Q6H PRN Rise Patience, MD      . heparin ADULT infusion 100 units/mL (25000 units/270mL sodium chloride 0.45%)  1,500 Units/hr Intravenous Continuous Rise Patience, MD 15 mL/hr at 06/08/19 0415 1,500 Units/hr at 06/08/19 0415  . hydrochlorothiazide (HYDRODIURIL) tablet 25 mg  25 mg Oral Daily Rise Patience, MD      . HYDROcodone-acetaminophen (NORCO/VICODIN) 5-325 MG per tablet 1 tablet  1 tablet Oral Daily PRN Rise Patience, MD   1 tablet at 06/07/19 2343  . influenza vac split quadrivalent PF (FLUARIX) injection 0.5 mL  0.5 mL Intramuscular Tomorrow-1000 Rise Patience, MD      . losartan (COZAAR) tablet 50 mg  50 mg Oral Daily Rise Patience, MD      . methocarbamol (ROBAXIN) tablet 750 mg  750 mg Oral BID PRN Rise Patience, MD      . ondansetron [facility]) tablet 4 mg  4 mg Oral Q6H PRN Rise Patience, MD  Or  . ondansetron (ZOFRAN) injection 4 mg  4 mg Intravenous Q6H PRN Rise Patience, MD      . pantoprazole (PROTONIX) EC tablet 40 mg  40 mg Oral Daily Rise Patience, MD        REVIEW OF SYSTEMS:  [X]  denotes positive finding, [ ]  denotes negative finding Cardiac  Comments:  Chest pain or chest pressure:    Shortness of breath upon exertion: x   Short of breath when lying flat:    Irregular heart rhythm:        Vascular    Pain in calf, thigh, or hip brought on by ambulation:    Pain in feet at night that wakes you up from your sleep:     Blood clot in your veins:    Leg swelling:  x         PHYSICAL EXAM: Vitals:   06/07/19 2157 06/07/19 2159 06/07/19 2243 06/08/19 0518  BP: (!) 154/84  (!) 110/96 140/88  Pulse: 95  72 64  Resp: 12     Temp:   98 F (36.7 C) 98.1 F (36.7 C)  TempSrc:   Oral Oral  SpO2: 100%  100% 100%  Weight:  101 kg 100 kg   Height:   5\' 1"  (1.549 m)      GENERAL: The patient is a well-nourished female, in no acute distress. The vital signs are documented above. CARDIOVASCULAR: No swelling in her right leg with 2+ dorsalis pedis pulse.  Marked swelling throughout her thigh and her entire left leg.  I do not palpate pulses due to swelling.  No evidence of ischemia and no evidence of blistering or skin issues. PULMONARY: There is good air exchange  MUSCULOSKELETAL: There are no major deformities or cyanosis. NEUROLOGIC: No focal weakness or paresthesias are detected. SKIN: There are no ulcers or rashes noted. PSYCHIATRIC: The patient has a normal affect.  DATA:  CT scan showing pulmonary embolus  Lower extremity venous duplex showing extensive clot throughout her iliac vein and distally  MEDICAL ISSUES: Had a long discussion with the patient regarding options.  Explained that she is being treated appropriately for her pulmonary embolus with anticoagulation.  Also explained this is the appropriate treatment for DVT.  Due to the extensive nature of her thrombus, she may benefit from mechanical removal of her left leg clot.  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  We will discuss her case with my partners doing this type of venous treatment and will make recommendation later this morning    Rosetta Posner, MD [facility] Tel [phone] Pager [phone]

## 2019-06-08 NOTE — Consult Note (Signed)
Vascular [facility] Vein Specialist of [place]  Patient name: Patricia Davila MRN: [id number] DOB: [birth year] Sex: female    HPI: Patricia Davila is a 48 y.o. female seen in consultation for extensive left leg DVT and pulmonary embolus.  She had presented yesterday with marked swelling in her leg.  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  Clot extends through the femoral vein, popliteal vein and tibial veins.  She denies any prior history of venous thrombosis.  She reports that her maternal grandmother had DVT at an older age.  She did recently have ankle surgery for ankle fracture approximately 3 months ago.  She presented with leg swelling and shortness of breath.  CT scan confirmed pulmonary embolus.  She is admitted for IV heparin and is being seen to consider more aggressive clearing of her left leg thrombus.  Past Medical History:  Diagnosis Date  . Anemia    low iron during pregnancy  . Anxiety   . Arthritis   . Asthma    as a child  . Bipolar disorder ([place])   . Depression   . GERD (gastroesophageal reflux disease)   . Headache   . Hypertension   . Left trimalleolar fracture   . Pneumonia    as a child  . PONV (postoperative nausea and vomiting)   . Restless legs     Family History  Problem Relation Age of Onset  . Colon cancer Mother   . HIV Mother   . Colon cancer Sister   . Breast cancer Neg Hx     SOCIAL HISTORY: Social History   Tobacco Use  . Smoking status: Current Every Day Smoker    Packs/day: 0.25    Types: Cigarettes  . Smokeless tobacco: Never Used  Substance Use Topics  . Alcohol use: No    Allergies  Allergen Reactions  . Ativan [Lorazepam] Anxiety    HALLUCINATIONS  . Tylenol With Codeine #3 [Acetaminophen-Codeine] Hives and Itching    Current Facility-Administered Medications  Medication Dose Route Frequency Provider Last Rate Last Dose  . acetaminophen (TYLENOL) tablet  650 mg  650 mg Oral Q6H PRN Rise Patience, MD       Or  . acetaminophen (TYLENOL) suppository 650 mg  650 mg Rectal Q6H PRN Rise Patience, MD      . heparin ADULT infusion 100 units/mL (25000 units/28mL sodium chloride 0.45%)  1,500 Units/hr Intravenous Continuous Rise Patience, MD 15 mL/hr at 06/08/19 0415 1,500 Units/hr at 06/08/19 0415  . hydrochlorothiazide (HYDRODIURIL) tablet 25 mg  25 mg Oral Daily Rise Patience, MD      . HYDROcodone-acetaminophen (NORCO/VICODIN) 5-325 MG per tablet 1 tablet  1 tablet Oral Daily PRN Rise Patience, MD   1 tablet at 06/07/19 2343  . influenza vac split quadrivalent PF (FLUARIX) injection 0.5 mL  0.5 mL Intramuscular Tomorrow-1000 Rise Patience, MD      . losartan (COZAAR) tablet 50 mg  50 mg Oral Daily Rise Patience, MD      . methocarbamol (ROBAXIN) tablet 750 mg  750 mg Oral BID PRN Rise Patience, MD      . ondansetron [facility]) tablet 4 mg  4 mg Oral Q6H PRN Rise Patience, MD  Or  . ondansetron (ZOFRAN) injection 4 mg  4 mg Intravenous Q6H PRN Rise Patience, MD      . pantoprazole (PROTONIX) EC tablet 40 mg  40 mg Oral Daily Rise Patience, MD        REVIEW OF SYSTEMS:  [X]  denotes positive finding, [ ]  denotes negative finding Cardiac  Comments:  Chest pain or chest pressure:    Shortness of breath upon exertion: x   Short of breath when lying flat:    Irregular heart rhythm:        Vascular    Pain in calf, thigh, or hip brought on by ambulation:    Pain in feet at night that wakes you up from your sleep:     Blood clot in your veins:    Leg swelling:  x         PHYSICAL EXAM: Vitals:   06/07/19 2157 06/07/19 2159 06/07/19 2243 06/08/19 0518  BP: (!) 154/84  (!) 110/96 140/88  Pulse: 95  72 64  Resp: 12     Temp:   98 F (36.7 C) 98.1 F (36.7 C)  TempSrc:   Oral Oral  SpO2: 100%  100% 100%  Weight:  101 kg 100 kg   Height:   5\' 1"  (1.549 m)      GENERAL: The patient is a well-nourished female, in no acute distress. The vital signs are documented above. CARDIOVASCULAR: No swelling in her right leg with 2+ dorsalis pedis pulse.  Marked swelling throughout her thigh and her entire left leg.  I do not palpate pulses due to swelling.  No evidence of ischemia and no evidence of blistering or skin issues. PULMONARY: There is good air exchange  MUSCULOSKELETAL: There are no major deformities or cyanosis. NEUROLOGIC: No focal weakness or paresthesias are detected. SKIN: There are no ulcers or rashes noted. PSYCHIATRIC: The patient has a normal affect.  DATA:  CT scan showing pulmonary embolus  Lower extremity venous duplex showing extensive clot throughout her iliac vein and distally  MEDICAL ISSUES: Had a long discussion with the patient regarding options.  Explained that she is being treated appropriately for her pulmonary embolus with anticoagulation.  Also explained this is the appropriate treatment for DVT.  Due to the extensive nature of her thrombus, she may benefit from mechanical removal of her left leg clot.  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  We will discuss her case with my partners doing this type of venous treatment and will make recommendation later this morning    Rosetta Posner, MD [facility]ists of [facility] Tel [phone] Pager [phone]

## 2019-06-08 NOTE — Progress Notes (Signed)
PROGRESS NOTE    Patricia Davila  [id number] DOB: September 13, 1970 DOA: 06/07/2019 PCP: Clent Demark, PA-C      Brief Narrative:  Patricia Davila is a 48 y.o. F with HTN, chart hx Bipolar, childhood asthma, and recent trimalleolar fracture who presented with 1 week LLE swelling as well as SOB.  Her orthopedist ordered a venous LLE doppler that showed DVT and she was sent to ER.  In the ER, repeat doppler confirmed extensive LLE DVT.  CTA showed PE without strain.  Vascular surgery was consulted and heparin was started.      Assessment & Plan:  Extensive LLE DVT First provoked VTE episode. -Continue heparin gtt -Consult Vascular surgery re: treatment to prevent post-thrombotic syndrome   Pulmonary embolism PESI 48, very low risk -Continue heparin gtt -COnsult SW re: DOAC coverage -Obtain echocardiogram  Hypertension -Continue ARB and HCTZ  Recent ORIF with nonhealing wound Smoking cessation recommended  GERD  -Continue pantoprazole  Hypokalemia -Replete K -Check mag  Anemia, normocytic -Trend CBC on heparin      MDM and disposition: The below labs and imaging reports were reviewed and summarized above.  Medication management as above.  The patient was admitted with extensive DVT and PE.  Vascular surgery will be consulted regarding thrombectomy of her lega nd possibly stenting.  Continue IV heparin       DVT prophylaxis: N/A Code Status: FULL Family Communication:     Consultants:   Vascular surgery  Procedures:   10/21 CTA chest -- PE  10/21 doppler LLE -- extensive occlusive clot  10/22 Echo - pending    Subjective: Mild chest pain, pleuritic.  Mild dyspnea.  LL swelling persists.  No confusion, syncope, fever, hemopytsis.  Objective: Vitals:   06/07/19 2157 06/07/19 2159 06/07/19 2243 06/08/19 0518  BP: (!) 154/84  (!) 110/96 140/88  Pulse: 95  72 64  Resp: 12     Temp:   98 F (36.7 C) 98.1 F (36.7 C)  TempSrc:   Oral Oral   SpO2: 100%  100% 100%  Weight:  101 kg 100 kg   Height:   5\' 1"  (1.549 m)     Intake/Output Summary (Last 24 hours) at 06/08/2019 1030 Last data filed at 06/08/2019 0400 Gross per 24 hour  Intake 370.76 ml  Output -  Net 370.76 ml   Filed Weights   06/07/19 2111 06/07/19 2159 06/07/19 2243  Weight: 101 kg 101 kg 100 kg    Examination: General appearance:  adult female, alert and in no acute distress.   HEENT: Anicteric, conjunctiva pink, lids and lashes normal. No nasal deformity, discharge, epistaxis.  Lips moist, dentition adequate, OP miost, no oral lesions, hearing normal.   Skin: Warm and dry.  no jaundice.  No suspicious rashes or lesions. Cardiac: RRR, nl S1-S2, no murmurs appreciated.  Capillary refill is brisk.  JVP not visible.  No LE edema.  Radia  pulses 2+ and symmetric. Respiratory: Normal respiratory rate and rhythm.  CTAB without rales or wheezes. Abdomen: Abdomen soft.  No TTP. No ascites, distension, hepatosplenomegaly.   MSK: No deformities or effusions.  Left leg swollen Neuro: Awake and alert.  EOMI, moves all extremities. Speech fluent.    Psych: Sensorium intact and responding to questions, attention normal. Affect normal.  Judgment and insight appear normal.    Data Reviewed: I have personally reviewed following labs and imaging studies:  CBC: Recent Labs  Lab 06/07/19 1531 06/08/19 0352  WBC 9.1 8.6  HGB 11.0* 10.4*  HCT 32.7* 31.3*  MCV 89.1 87.4  PLT 320 [id number]   Basic Metabolic Panel: Recent Labs  Lab 06/07/19 1531 06/08/19 0352 06/08/19 0740  NA 140 140  --   K 3.5 2.8*  --   CL 104 106  --   CO2 26 26  --   GLUCOSE 93 91  --   BUN 15 12  --   CREATININE 1.03* 0.86  --   CALCIUM 8.9 8.6*  --   MG  --   --  1.9   GFR: Estimated Creatinine Clearance: 86.8 mL/min (by C-G formula based on SCr of 0.86 mg/dL). Liver Function Tests: No results for input(s): AST, ALT, ALKPHOS, BILITOT, PROT, ALBUMIN in the last 168 hours. No results  for input(s): LIPASE, AMYLASE in the last 168 hours. No results for input(s): AMMONIA in the last 168 hours. Coagulation Profile: No results for input(s): INR, PROTIME in the last 168 hours. Cardiac Enzymes: No results for input(s): CKTOTAL, CKMB, CKMBINDEX, TROPONINI in the last 168 hours. BNP (last 3 results) No results for input(s): PROBNP in the last 8760 hours. HbA1C: No results for input(s): HGBA1C in the last 72 hours. CBG: No results for input(s): GLUCAP in the last 168 hours. Lipid Profile: No results for input(s): CHOL, HDL, LDLCALC, TRIG, CHOLHDL, LDLDIRECT in the last 72 hours. Thyroid Function Tests: No results for input(s): TSH, T4TOTAL, FREET4, T3FREE, THYROIDAB in the last 72 hours. Anemia Panel: No results for input(s): VITAMINB12, FOLATE, FERRITIN, TIBC, IRON, RETICCTPCT in the last 72 hours. Urine analysis:    Component Value Date/Time   COLORURINE YELLOW 03/23/2017 [place] 03/23/2017 1235   LABSPEC 1.019 03/23/2017 1235   PHURINE 5.0 03/23/2017 1235   GLUCOSEU NEGATIVE 03/23/2017 1235   HGBUR NEGATIVE 03/23/2017 1235   HGBUR negative 05/13/2010 1036   BILIRUBINUR NEGATIVE 03/23/2017 1235   KETONESUR NEGATIVE 03/23/2017 1235   PROTEINUR NEGATIVE 03/23/2017 1235   UROBILINOGEN 0.2 04/21/2013 0039   NITRITE NEGATIVE 03/23/2017 1235   LEUKOCYTESUR NEGATIVE 03/23/2017 1235   Sepsis Labs: @LABRCNTIP (procalcitonin:4,lacticacidven:4)  ) Recent Results (from the past 240 hour(s))  SARS Coronavirus 2 by RT PCR (hospital order, performed in [place] hospital lab) Nasopharyngeal Nasopharyngeal Swab     Status: None   Collection Time: 06/07/19  9:06 PM   Specimen: Nasopharyngeal Swab  Result Value Ref Range Status   SARS Coronavirus 2 NEGATIVE NEGATIVE Final    Comment: (NOTE) If result is NEGATIVE SARS-CoV-2 target nucleic acids are NOT DETECTED. The SARS-CoV-2 RNA is generally detectable in upper and lower  respiratory specimens during  the acute phase of infection. The lowest  concentration of SARS-CoV-2 viral copies this assay can detect is 250  copies / mL. A negative result does not preclude SARS-CoV-2 infection  and should not be used as the sole basis for treatment or other  patient management decisions.  A negative result may occur with  improper specimen collection / handling, submission of specimen other  than nasopharyngeal swab, presence of viral mutation(s) within the  areas targeted by this assay, and inadequate number of viral copies  (<250 copies / mL). A negative result must be combined with clinical  observations, patient history, and epidemiological information. If result is POSITIVE SARS-CoV-2 target nucleic acids are DETECTED. The SARS-CoV-2 RNA is generally detectable in upper and lower  respiratory specimens dur ing the acute phase of infection.  Positive  results are indicative of active infection with SARS-CoV-2.  Clinical  correlation with patient history and other diagnostic information is  necessary to determine patient infection status.  Positive results do  not rule out bacterial infection or co-infection with other viruses. If result is PRESUMPTIVE POSTIVE SARS-CoV-2 nucleic acids MAY BE PRESENT.   A presumptive positive result was obtained on the submitted specimen  and confirmed on repeat testing.  While 2019 novel coronavirus  (SARS-CoV-2) nucleic acids may be present in the submitted sample  additional confirmatory testing may be necessary for epidemiological  and / or clinical management purposes  to differentiate between  SARS-CoV-2 and other Sarbecovirus currently known to infect humans.  If clinically indicated additional testing with an alternate test  methodology [phone]) is advised. The SARS-CoV-2 RNA is generally  detectable in upper and lower respiratory sp ecimens during the acute  phase of infection. The expected result is Negative. Fact Sheet for Patients:   StrictlyIdeas.no Fact Sheet for Healthcare Providers: BankingDealers.co.za This test is not yet approved or cleared by the [place] FDA and has been authorized for detection and/or diagnosis of SARS-CoV-2 by FDA under an Emergency Use Authorization (EUA).  This EUA will remain in effect (meaning this test can be used) for the duration of the COVID-19 declaration under Section 564(b)(1) of the Act, 21 U.S.C. section 360bbb-3(b)(1), unless the authorization is terminated or revoked sooner. Performed at [facility], [place] [address] [place], [place] [address]          Radiology Studies: Dg Chest 2 View  Result Date: 06/07/2019 CLINICAL DATA:  Shortness of breath EXAM: CHEST - 2 VIEW COMPARISON:  June 29, 2015. FINDINGS: Lungs are clear. Heart size and pulmonary vascularity are normal. No adenopathy. No bone lesions. IMPRESSION: No edema or consolidation. Electronically Signed   By: Lowella Grip III M.D.   On: 06/07/2019 16:12   Ct Angio Chest Pe W And/or Wo Contrast  Result Date: 06/07/2019 CLINICAL DATA:  Known clots in the leg, short of breath EXAM: CT ANGIOGRAPHY CHEST WITH CONTRAST TECHNIQUE: Multidetector CT imaging of the chest was performed using the standard protocol during bolus administration of intravenous contrast. Multiplanar CT image reconstructions and MIPs were obtained to evaluate the vascular anatomy. CONTRAST:  166mL OMNIPAQUE IOHEXOL 350 MG/ML SOLN COMPARISON:  Chest x-ray 06/07/2019 FINDINGS: Cardiovascular: Satisfactory opacification of the pulmonary arteries to the segmental level. Acute thrombus within right inter lobar pulmonary artery, with extension of thrombus into right lower lobe segmental and subsegmental vessels. RV LV ratio is non elevated. Nonaneurysmal aorta. No dissection seen. Borderline to mild cardiomegaly. No pericardial effusion. Mediastinum/Nodes: No enlarged mediastinal, hilar, or  axillary lymph nodes. Thyroid gland, trachea, and esophagus demonstrate no significant findings. Lungs/Pleura: Lungs are clear. No pleural effusion or pneumothorax. Upper Abdomen: No acute abnormality. Musculoskeletal: No chest wall abnormality. No acute or significant osseous findings. Review of the MIP images confirms the above findings. IMPRESSION: Positive for acute pulmonary embolus involving right descending pulmonary artery and right lower lobe segmental and subsegmental vessels. Negative for right heart strain with non elevated RV LV ratio. Clear lung fields \ Critical Value/emergent results were called by telephone at the time of interpretation on 06/07/2019 at 8:41 pm to providerANKIT NANAVATI , who verbally acknowledged these results. Electronically Signed   By: Donavan Foil M.D.   On: 06/07/2019 20:41   Vas [place] Ivc/iliac (venous Only)  Result Date: 06/07/2019 IVC/ILIAC STUDY Indications: Left lower extremity DVT Other Factors: Left ankle injury and surgery. Limitations: Air/bowel gas.  Comparison Study: No prior study.  Performing Technologist: Maudry Mayhew MHA, RDMS, RVT, RDCS  Examination Guidelines: A complete evaluation includes B-mode imaging, spectral Doppler, color Doppler, and power Doppler as needed of all accessible portions of each vessel. Bilateral testing is considered an integral part of a complete examination. Limited examinations for reoccurring indications may be performed as noted.  IVC/Iliac Findings: +----------+------+--------+--------+    IVC    PatentThrombusComments +----------+------+--------+--------+ IVC Distalpatent                 +----------+------+--------+--------+  +-------------------+---------+-----------+---------+-----------+--------+         CIV        RT-PatentRT-ThrombusLT-PatentLT-ThrombusComments +-------------------+---------+-----------+---------+-----------+--------+ Common Iliac Distal patent                         acute             +-------------------+---------+-----------+---------+-----------+--------+    Summary: IVC/Iliac: There is no evidence of thrombus involving the IVC. There is no evidence of thrombus involving the right common iliac vein. There is evidence of acute thrombus involving the left common iliac vein.  *See table(s) above for measurements and observations.  Electronically signed by Curt Jews MD on 06/07/2019 at 3:49:29 PM.   Final    Xr Ankle Complete Left  Result Date: 06/06/2019 Stable fixation of trimalleolar ankle fracture.  Persistent lucency of the fibula.  There is no complications of the hardware.  Xr Hip Unilat W Or W/o Pelvis 2-3 Views Left  Result Date: 06/06/2019 Stable total hip replacement without complication.  Vas [place] Lower Extremity Venous (dvt)  Result Date: 06/07/2019  Lower Venous Study Indications: Swelling, and s/p left ankle injury and surgery x approximately 2 weeks.  Performing Technologist: Maudry Mayhew MHA, RDMS, RVT, RDCS  Examination Guidelines: A complete evaluation includes B-mode imaging, spectral Doppler, color Doppler, and power Doppler as needed of all accessible portions of each vessel. Bilateral testing is considered an integral part of a complete examination. Limited examinations for reoccurring indications may be performed as noted.  +-----+---------------+---------+-----------+----------+--------------+ RIGHTCompressibilityPhasicitySpontaneityPropertiesThrombus Aging +-----+---------------+---------+-----------+----------+--------------+ CFV  Full           Yes      Yes                                 +-----+---------------+---------+-----------+----------+--------------+  +---------+---------------+---------+-----------+----------+--------------+ LEFT     CompressibilityPhasicitySpontaneityPropertiesThrombus Aging +---------+---------------+---------+-----------+----------+--------------+ CFV      None                    No                    Acute          +---------+---------------+---------+-----------+----------+--------------+ SFJ      None                                         Acute          +---------+---------------+---------+-----------+----------+--------------+ FV Prox  None                                         Acute          +---------+---------------+---------+-----------+----------+--------------+ FV Mid   None  Acute          +---------+---------------+---------+-----------+----------+--------------+ FV Distal                                             Not visualized +---------+---------------+---------+-----------+----------+--------------+ POP      None                    No                   Acute          +---------+---------------+---------+-----------+----------+--------------+ PTV      None                                         Acute          +---------+---------------+---------+-----------+----------+--------------+ PERO     None                                         Acute          +---------+---------------+---------+-----------+----------+--------------+ EIV                              No                   Acute          +---------+---------------+---------+-----------+----------+--------------+ CIV                              No                   Acute          +---------+---------------+---------+-----------+----------+--------------+  Summary: Right: No evidence of common femoral vein obstruction. Left: Findings consistent with acute deep vein thrombosis involving the left common iliac vein, left external iliac vein, left common femoral vein, left femoral vein, left popliteal vein, left posterior tibial veins, and left peroneal veins. No cystic structure found in the popliteal fossa.  *See table(s) above for measurements and observations. Electronically signed by Curt Jews MD on 06/07/2019 at 3:50:46  PM.    Final         Scheduled Meds: . hydrochlorothiazide  25 mg Oral Daily  . influenza vac split quadrivalent PF  0.5 mL Intramuscular Tomorrow-1000  . losartan  50 mg Oral Daily  . pantoprazole  40 mg Oral Daily  . potassium chloride  40 mEq Oral BID   Continuous Infusions: . heparin 1,500 Units/hr (06/08/19 0415)     LOS: 1 day    Time spent: 35 minutes    Edwin Dada, MD [facility] 06/08/2019, 10:30 AM     Please page through [place]:  [email or web address] Password [email or web address] If 7PM-7AM, please contact night-coverage

## 2019-06-08 NOTE — Progress Notes (Signed)
[place] for Heparin Indication: pulmonary embolus  Allergies  Allergen Reactions  . Ativan [Lorazepam] Anxiety    HALLUCINATIONS  . Tylenol With Codeine #3 [Acetaminophen-Codeine] Hives and Itching    Patient Measurements: Height: 5\' 1"  (154.9 cm) Weight: 220 lb 6.4 oz (100 kg)(boot taken off and weighed:2.4 lbs, was subtracted ) IBW/kg (Calculated) : 47.8 Heparin Dosing Weight: 68 kg  Vital Signs: Temp: 98 F (36.7 C) (10/21 2243) Temp Source: Oral (10/21 2243) BP: 110/96 (10/21 2243) Pulse Rate: 72 (10/21 2243)  Labs: Recent Labs    06/07/19 1531 06/08/19 0352  HGB 11.0* 10.4*  HCT 32.7* 31.3*  PLT 320 312  HEPARINUNFRC  --  0.90*  CREATININE 1.03* 0.86    Estimated Creatinine Clearance: 86.8 mL/min (by C-G formula based on SCr of 0.86 mg/dL).  Assessment: 48 y.o. female with DVT/PE for heparin  Goal of Therapy:  Heparin level 0.3-0.7 units/ml Monitor platelets by anticoagulation protocol: Yes   Plan:  Decrease heparin 1500 units/hr Check heparin level in 6 hours.   Phillis Knack, PharmD, BCPS  06/08/2019, 5:12 AM

## 2019-06-08 NOTE — Progress Notes (Signed)
Echocardiogram 2D Echocardiogram has been performed.  Oneal Deputy Leroy Trim 06/08/2019, 9:31 AM

## 2019-06-08 NOTE — TOC Benefit Eligibility Note (Signed)
Transition of Care [facility]) Benefit Eligibility Note    Patient Details  Name: Patricia Davila MRN: [id number] Date of Birth: 30-Jun-1971   Medication/Dose: Xarelto 15.mg 21 day  Covered?: Yes  Tier: Other(na)  Prescription Coverage Preferred Pharmacy: Any retail pharmacy  Spoke with Person/Company/Phone Number:: Alicia/ [employer]/ [phone]  Co-Pay: 3.90 for a 30 day supply  Prior Approval: No  Deductible: Unmet(Met 366.96)  Additional Notes: 20.mg once a day is 3.90 copay  for a 60 day supply can only have one a day  68.40 left on her [place] Phone Number: 06/08/2019, 1:56 PM

## 2019-06-08 NOTE — Progress Notes (Signed)
Patient ID: Patricia Davila, female   DOB: 1970-09-05, 48 y.o.   MRN: [id number] We will plan operative intervention tomorrow for large left iliofemoral DVT.  Can resume diet today and will be made n.p.o. after midnight for procedure tomorrow with Dr. Oneida Alar

## 2019-06-08 NOTE — Progress Notes (Signed)
[facility] for Heparin Indication: pulmonary embolus  Allergies  Allergen Reactions  . Ativan [Lorazepam] Anxiety    HALLUCINATIONS  . Tylenol With Codeine #3 [Acetaminophen-Codeine] Hives and Itching    Patient Measurements: Height: 5\' 1"  (154.9 cm) Weight: 220 lb 6.4 oz (100 kg)(boot taken off and weighed:2.4 lbs, was subtracted ) IBW/kg (Calculated) : 47.8 Heparin Dosing Weight: 68 kg  Vital Signs: Temp: 98.1 F (36.7 C) (10/22 0518) Temp Source: Oral (10/22 0518) BP: 140/88 (10/22 0518) Pulse Rate: 64 (10/22 0518)  Labs: Recent Labs    06/07/19 1531 06/08/19 0352 06/08/19 0740 06/08/19 1039  HGB 11.0* 10.4*  --   --   HCT 32.7* 31.3*  --   --   PLT 320 312  --   --   HEPARINUNFRC  --  0.90*  --  0.97*  CREATININE 1.03* 0.86  --   --   TROPONINIHS  --   --  5 3    Estimated Creatinine Clearance: 86.8 mL/min (by C-G formula based on SCr of 0.86 mg/dL).  Assessment: 48 y.o. female with DVT/PE for heparin. No AC PTA.  Heparin level came back supratherapeutic at 0.97, on 1500 units/hr. Heparin is infusing in R AC, level drawn from opposite side. Hgb 10.4, plt 312. No s/sx of bleeding. No infusion issues.   Goal of Therapy:  Heparin level 0.3-0.7 units/ml Monitor platelets by anticoagulation protocol: Yes   Plan:  Decrease heparin 1300 units/hr Check heparin level in 6 hours.   Antonietta Jewel, PharmD, BCCCP Clinical Pharmacist  Phone: [phone]  Please check AMION for all [place] phone numbers After 10:00 PM, call [place] [phone] 06/08/2019, 12:29 PM

## 2019-06-08 NOTE — ED Provider Notes (Signed)
[facility] Provider Note   CSN: [id number] Arrival date & time: 06/07/19  1506     History   Chief Complaint Chief Complaint  Patient presents with  . dvt    HPI Patricia Davila is a 48 y.o. female.     HPI  48 year old female with history of depression, trimalleolar fracture in July status post internal fixation comes in a chief complaint of leg swelling.  She reports that she went to an orthopedic doctor and was noted to have DVT.  Advised to come to the ER.  She has been having about 2 weeks of leg swelling and 1 week of pain.  She also reports for the past week that she has been having shortness of breath, and every few minutes she has to gasp for air.  She denies any prior history of PE.  Past Medical History:  Diagnosis Date  . Anemia    low iron during pregnancy  . Anxiety   . Arthritis   . Asthma    as a child  . Bipolar disorder ([place])   . Depression   . GERD (gastroesophageal reflux disease)   . Headache   . Hypertension   . Left trimalleolar fracture   . Pneumonia    as a child  . PONV (postoperative nausea and vomiting)   . Restless legs     Patient Active Problem List   Diagnosis Date Noted  . Pulmonary embolism ([place]) 06/07/2019  . Left leg DVT ([place]) 06/07/2019  . Displaced trimalleolar fracture of left lower leg, subsequent encounter for closed fracture with delayed healing 03/17/2019  . Primary osteoarthritis of right hip 04/08/2017  . History of hip replacement 03/25/2017  . Primary osteoarthritis of left hip 12/22/2016  . Major depressive disorder 10/24/2012  . Smokes tobacco daily 05/13/2010  . Headache(784.0) 05/13/2010  . Microalbuminuria 04/16/2009  . GERD 11/22/2008  . Left hip pain 11/22/2008  . Bipolar 1 disorder, depressed ([place]) 05/17/2007  . Lumbosacral degenerative disk disease 03/23/2005  . Essential hypertension, benign 08/18/2003    Past Surgical History:  Procedure Laterality Date  . BILATERAL ANTERIOR  TOTAL HIP ARTHROPLASTY Bilateral 03/25/2017   Procedure: BILATERAL ANTERIOR TOTAL HIP ARTHROPLASTY;  Surgeon: Leandrew Koyanagi, MD;  Location: [facility];  Service: Orthopedics;  Laterality: Bilateral;  . DENTAL SURGERY     all teeth extracted  . LAMINECTOMY AND MICRODISCECTOMY LUMBAR SPINE   06/27/2010  . ORIF ANKLE FRACTURE Left 03/17/2019   Procedure: OPEN REDUCTION INTERNAL FIXATION (ORIF) LEFT ANKLE FRACTURE;  Surgeon: Leandrew Koyanagi, MD;  Location: [facility];  Service: Orthopedics;  Laterality: Left;  . TUBAL LIGATION Bilateral      OB History   No obstetric history on file.      Home Medications    Prior to Admission medications   Medication Sig Start Date End Date Taking? Authorizing Provider  aspirin EC 81 MG tablet Take 1 tablet (81 mg total) by mouth 2 (two) times daily. 03/17/19  Yes Leandrew Koyanagi, MD  calcium-vitamin D (OSCAL WITH D) 500-200 MG-UNIT tablet Take 1 tablet by mouth 3 (three) times daily. 03/17/19  Yes Leandrew Koyanagi, MD  Cholecalciferol (VITAMIN D) 50 MCG (2000 UT) tablet Take 1 tablet (2,000 Units total) by mouth daily. 03/06/19  Yes Kerin Perna, NP  cyclobenzaprine (FLEXERIL) 5 MG tablet Take 1-2 tablets (5-10 mg total) by mouth 3 (three) times daily as needed for muscle spasms. 05/02/19  Yes Venida Jarvis,  Mary L, PA-C  hydrochlorothiazide (HYDRODIURIL) 25 MG tablet Take 1 tablet (25 mg total) by mouth daily. Take on tablet in the morning. 03/06/19  Yes Kerin Perna, NP  HYDROcodone-acetaminophen (NORCO) 5-325 MG tablet Take 1 tablet by mouth daily as needed for moderate pain. 05/16/19  Yes Aundra Dubin, PA-C  losartan (COZAAR) 50 MG tablet Take 1 tablet (50 mg total) by mouth daily. 03/06/19  Yes Kerin Perna, NP  methocarbamol (ROBAXIN) 750 MG tablet Take 1 tablet (750 mg total) by mouth 2 (two) times daily as needed for muscle spasms. 03/17/19  Yes Leandrew Koyanagi, MD  mupirocin ointment (BACTROBAN) 2 % Apply 1 application topically 2 (two)  times daily. 06/06/19  Yes Leandrew Koyanagi, MD  naproxen (NAPROSYN) 500 MG tablet Take 1 tablet (500 mg total) by mouth 2 (two) times daily with a meal. 05/02/19  Yes Aundra Dubin, PA-C  omeprazole (PRILOSEC) 40 MG capsule Take 1 capsule (40 mg total) by mouth daily. 03/06/19  Yes Kerin Perna, NP    Family History Family History  Problem Relation Age of Onset  . Colon cancer Mother   . HIV Mother   . Colon cancer Sister   . Breast cancer Neg Hx     Social History Social History   Tobacco Use  . Smoking status: Current Every Day Smoker    Packs/day: 0.25    Types: Cigarettes  . Smokeless tobacco: Never Used  Substance Use Topics  . Alcohol use: No  . Drug use: No     Allergies   Ativan [lorazepam] and Tylenol with codeine #3 [acetaminophen-codeine]   Review of Systems Review of Systems  Constitutional: Positive for activity change.  Respiratory: Positive for shortness of breath.   Musculoskeletal: Positive for arthralgias.  Skin: Positive for rash and wound.  All other systems reviewed and are negative.    Physical Exam Updated Vital Signs BP (!) 110/96 (BP Location: Left Arm)   Pulse 72   Temp 98 F (36.7 C) (Oral)   Resp 12   Ht 5\' 1"  (1.549 m)   Wt 100 kg Comment: boot taken off and weighed:2.4 lbs, was subtracted   LMP 05/20/2019   SpO2 100%   BMI 41.64 kg/m   Physical Exam Vitals signs and nursing note reviewed.  Constitutional:      Appearance: She is well-developed.  HENT:     Head: Normocephalic and atraumatic.  Neck:     Musculoskeletal: Normal range of motion and neck supple.  Cardiovascular:     Rate and Rhythm: Normal rate.  Pulmonary:     Effort: Pulmonary effort is normal.  Abdominal:     General: Bowel sounds are normal.  Musculoskeletal:     Left lower leg: Edema present.  Skin:    General: Skin is warm and dry.  Neurological:     Mental Status: She is alert and oriented to person, place, and time.      ED  Treatments / Results  Labs (all labs ordered are listed, but only abnormal results are displayed) Labs Reviewed  BASIC METABOLIC PANEL - Abnormal; Notable for the following components:      Result Value   Creatinine, Ser 1.03 (*)    All other components within normal limits  CBC - Abnormal; Notable for the following components:   RBC 3.67 (*)    Hemoglobin 11.0 (*)    HCT 32.7 (*)    All other components within normal limits  SARS  CORONAVIRUS 2 BY RT PCR (HOSPITAL ORDER, [facility])  HEPARIN LEVEL (UNFRACTIONATED)  CBC  HIV ANTIBODY (ROUTINE TESTING W REFLEX)  BASIC METABOLIC PANEL  I-STAT BETA HCG BLOOD, ED ([facility], [facility], [facility] ONLY)    EKG EKG Interpretation  Date/Time:  Wednesday June 07 2019 15:27:13 EDT Ventricular Rate:  68 PR Interval:  174 QRS Duration: 92 QT Interval:  416 QTC Calculation: 442 R Axis:   58 Text Interpretation:  Normal sinus rhythm Low voltage QRS Nonspecific T wave abnormality Abnormal ECG No acute changes No significant change since last tracing Confirmed by Varney Biles [phone]) on 06/07/2019 6:48:30 PM   Radiology Dg Chest 2 View  Result Date: 06/07/2019 CLINICAL DATA:  Shortness of breath EXAM: CHEST - 2 VIEW COMPARISON:  June 29, 2015. FINDINGS: Lungs are clear. Heart size and pulmonary vascularity are normal. No adenopathy. No bone lesions. IMPRESSION: No edema or consolidation. Electronically Signed   By: Lowella Grip III M.D.   On: 06/07/2019 16:12   Ct Angio Chest Pe W And/or Wo Contrast  Result Date: 06/07/2019 CLINICAL DATA:  Known clots in the leg, short of breath EXAM: CT ANGIOGRAPHY CHEST WITH CONTRAST TECHNIQUE: Multidetector CT imaging of the chest was performed using the standard protocol during bolus administration of intravenous contrast. Multiplanar CT image reconstructions and MIPs were obtained to evaluate the vascular anatomy. CONTRAST:  11mL OMNIPAQUE IOHEXOL 350 MG/ML SOLN COMPARISON:  Chest  x-ray 06/07/2019 FINDINGS: Cardiovascular: Satisfactory opacification of the pulmonary arteries to the segmental level. Acute thrombus within right inter lobar pulmonary artery, with extension of thrombus into right lower lobe segmental and subsegmental vessels. RV LV ratio is non elevated. Nonaneurysmal aorta. No dissection seen. Borderline to mild cardiomegaly. No pericardial effusion. Mediastinum/Nodes: No enlarged mediastinal, hilar, or axillary lymph nodes. Thyroid gland, trachea, and esophagus demonstrate no significant findings. Lungs/Pleura: Lungs are clear. No pleural effusion or pneumothorax. Upper Abdomen: No acute abnormality. Musculoskeletal: No chest wall abnormality. No acute or significant osseous findings. Review of the MIP images confirms the above findings. IMPRESSION: Positive for acute pulmonary embolus involving right descending pulmonary artery and right lower lobe segmental and subsegmental vessels. Negative for right heart strain with non elevated RV LV ratio. Clear lung fields \ Critical Value/emergent results were called by telephone at the time of interpretation on 06/07/2019 at 8:41 pm to providerANKIT Camyah Pultz , who verbally acknowledged these results. Electronically Signed   By: Donavan Foil M.D.   On: 06/07/2019 20:41   Vas [place] Ivc/iliac (venous Only)  Result Date: 06/07/2019 IVC/ILIAC STUDY Indications: Left lower extremity DVT Other Factors: Left ankle injury and surgery. Limitations: Air/bowel gas.  Comparison Study: No prior study. Performing Technologist: Maudry Mayhew MHA, RDMS, RVT, RDCS  Examination Guidelines: A complete evaluation includes B-mode imaging, spectral Doppler, color Doppler, and power Doppler as needed of all accessible portions of each vessel. Bilateral testing is considered an integral part of a complete examination. Limited examinations for reoccurring indications may be performed as noted.  IVC/Iliac Findings: +----------+------+--------+--------+     IVC    PatentThrombusComments +----------+------+--------+--------+ IVC Distalpatent                 +----------+------+--------+--------+  +-------------------+---------+-----------+---------+-----------+--------+         CIV        RT-PatentRT-ThrombusLT-PatentLT-ThrombusComments +-------------------+---------+-----------+---------+-----------+--------+ Common Iliac Distal patent                         acute            +-------------------+---------+-----------+---------+-----------+--------+  Summary: IVC/Iliac: There is no evidence of thrombus involving the IVC. There is no evidence of thrombus involving the right common iliac vein. There is evidence of acute thrombus involving the left common iliac vein.  *See table(s) above for measurements and observations.  Electronically signed by Curt Jews MD on 06/07/2019 at 3:49:29 PM.   Final    Xr Ankle Complete Left  Result Date: 06/06/2019 Stable fixation of trimalleolar ankle fracture.  Persistent lucency of the fibula.  There is no complications of the hardware.  Xr Hip Unilat W Or W/o Pelvis 2-3 Views Left  Result Date: 06/06/2019 Stable total hip replacement without complication.  Vas [place] Lower Extremity Venous (dvt)  Result Date: 06/07/2019  Lower Venous Study Indications: Swelling, and s/p left ankle injury and surgery x approximately 2 weeks.  Performing Technologist: Maudry Mayhew MHA, RDMS, RVT, RDCS  Examination Guidelines: A complete evaluation includes B-mode imaging, spectral Doppler, color Doppler, and power Doppler as needed of all accessible portions of each vessel. Bilateral testing is considered an integral part of a complete examination. Limited examinations for reoccurring indications may be performed as noted.  +-----+---------------+---------+-----------+----------+--------------+ RIGHTCompressibilityPhasicitySpontaneityPropertiesThrombus Aging  +-----+---------------+---------+-----------+----------+--------------+ CFV  Full           Yes      Yes                                 +-----+---------------+---------+-----------+----------+--------------+  +---------+---------------+---------+-----------+----------+--------------+ LEFT     CompressibilityPhasicitySpontaneityPropertiesThrombus Aging +---------+---------------+---------+-----------+----------+--------------+ CFV      None                    No                   Acute          +---------+---------------+---------+-----------+----------+--------------+ SFJ      None                                         Acute          +---------+---------------+---------+-----------+----------+--------------+ FV Prox  None                                         Acute          +---------+---------------+---------+-----------+----------+--------------+ FV Mid   None                                         Acute          +---------+---------------+---------+-----------+----------+--------------+ FV Distal                                             Not visualized +---------+---------------+---------+-----------+----------+--------------+ POP      None                    No                   Acute          +---------+---------------+---------+-----------+----------+--------------+ PTV      None  Acute          +---------+---------------+---------+-----------+----------+--------------+ PERO     None                                         Acute          +---------+---------------+---------+-----------+----------+--------------+ EIV                              No                   Acute          +---------+---------------+---------+-----------+----------+--------------+ CIV                              No                   Acute           +---------+---------------+---------+-----------+----------+--------------+  Summary: Right: No evidence of common femoral vein obstruction. Left: Findings consistent with acute deep vein thrombosis involving the left common iliac vein, left external iliac vein, left common femoral vein, left femoral vein, left popliteal vein, left posterior tibial veins, and left peroneal veins. No cystic structure found in the popliteal fossa.  *See table(s) above for measurements and observations. Electronically signed by Curt Jews MD on 06/07/2019 at 3:50:46 PM.    Final     Procedures .Critical Care Performed by: Varney Biles, MD Authorized by: Varney Biles, MD   Critical care provider statement:    Critical care time (minutes):  38   Critical care was necessary to treat or prevent imminent or life-threatening deterioration of the following conditions: Extensive deep venous thrombosis of the left lower extremity and acute pulmonary embolism bilateral.   Critical care was time spent personally by me on the following activities:  Discussions with consultants, evaluation of patient's response to treatment, examination of patient, ordering and performing treatments and interventions, ordering and review of laboratory studies, ordering and review of radiographic studies, pulse oximetry, re-evaluation of patient's condition, obtaining history from patient or surrogate and review of old charts   (including critical care time)  Medications Ordered in ED Medications  heparin ADULT infusion 100 units/mL (25000 units/262mL sodium chloride 0.45%) (1,600 Units/hr Intravenous Rate/Dose Verify 06/07/19 2300)  HYDROcodone-acetaminophen (NORCO/VICODIN) 5-325 MG per tablet 1 tablet (1 tablet Oral Given 06/07/19 2343)  hydrochlorothiazide (HYDRODIURIL) tablet 25 mg (has no administration in time range)  losartan (COZAAR) tablet 50 mg (has no administration in time range)  pantoprazole (PROTONIX) EC tablet 40 mg (has no  administration in time range)  methocarbamol (ROBAXIN) tablet 750 mg (has no administration in time range)  acetaminophen (TYLENOL) tablet 650 mg (has no administration in time range)    Or  acetaminophen (TYLENOL) suppository 650 mg (has no administration in time range)  ondansetron (ZOFRAN) tablet 4 mg (has no administration in time range)    Or  ondansetron (ZOFRAN) injection 4 mg (has no administration in time range)  influenza vac split quadrivalent PF (FLUARIX) injection 0.5 mL (has no administration in time range)  sodium chloride flush (NS) 0.9 % injection 3 mL (3 mLs Intravenous Given 06/07/19 1906)  sodium chloride 0.9 % bolus 500 mL (0 mLs Intravenous Stopping Infusion hung by another clincian 06/07/19 2158)  iohexol (OMNIPAQUE) 350 MG/ML injection  100 mL (100 mLs Intravenous Contrast Given 06/07/19 2012)  heparin bolus via infusion 5,000 Units (5,000 Units Intravenous Bolus from Bag 06/07/19 2246)     Initial Impression / Assessment and Plan / ED Course  I have reviewed the triage vital signs and the nursing notes.  Pertinent labs & imaging results that were available during my care of the patient were reviewed by me and considered in my medical decision making (see chart for details).  Clinical Course as of Jun 07 40  Thu Jun 08, 2019  0041 CT scan independently reviewed.  Bilateral PE seen  CT Angio Chest PE W and/or Wo Contrast [AN]    Clinical Course User Index [AN] Varney Biles, MD        48 year old comes in a chief complaint of shortness of breath and leg swelling.  She was diagnosed with DVT earlier today.  It appears that she has a pretty large and extensive DVT.  I am concerned that she also might be having a PE.  CT angio ordered and it confirms an acute PE. Fortunately there is no right-sided heart strain.  For the extensive DVT I spoke with Dr. Donnetta Hutching, vascular surgery.  He requested patient be kept n.p.o. after midnight and they will assess her in  the morning to see if she is a candidate for vascular intervention to reduce the thrombotic burden.  I discussed the diagnosis of PE and plan for vascular surgery to see her tomorrow.  Patient is in agreement with the plan for admission.  Final Clinical Impressions(s) / ED Diagnoses   Final diagnoses:  Acute pulmonary embolism without acute cor pulmonale, unspecified pulmonary embolism type (HCC)  Acute deep vein thrombosis (DVT) of proximal vein of left lower extremity [facility])    ED Discharge Orders    None       Varney Biles, MD 06/08/19 [phone]

## 2019-06-08 NOTE — Progress Notes (Signed)
[facility] for Heparin Indication: pulmonary embolus  Allergies  Allergen Reactions  . Ativan [Lorazepam] Anxiety    HALLUCINATIONS  . Tylenol With Codeine #3 [Acetaminophen-Codeine] Hives and Itching    Patient Measurements: Height: 5\' 1"  (154.9 cm) Weight: 220 lb 6.4 oz (100 kg)(boot taken off and weighed:2.4 lbs, was subtracted ) IBW/kg (Calculated) : 47.8 Heparin Dosing Weight: 68 kg  Vital Signs: Temp: 98.2 F (36.8 C) (10/22 1409) Temp Source: Oral (10/22 1409) BP: 121/100 (10/22 1409) Pulse Rate: 75 (10/22 1409)  Labs: Recent Labs    06/07/19 1531 06/08/19 0352 06/08/19 0740 06/08/19 1039 06/08/19 1925  HGB 11.0* 10.4*  --   --   --   HCT 32.7* 31.3*  --   --   --   PLT 320 312  --   --   --   HEPARINUNFRC  --  0.90*  --  0.97* 0.79*  CREATININE 1.03* 0.86  --   --   --   TROPONINIHS  --   --  5 3  --     Estimated Creatinine Clearance: 86.8 mL/min (by C-G formula based on SCr of 0.86 mg/dL).  Assessment: 48 y.o. female with DVT/PE for heparin. No AC PTA.  Heparin level came back supratherapeutic at 0.79, on 1300 units/hr.  Hgb 10.4, plt 312. No s/sx of bleeding. No infusion issues.   Goal of Therapy:  Heparin level 0.3-0.7 units/ml Monitor platelets by anticoagulation protocol: Yes   Plan:  Decrease heparin 1150 units/hr Check heparin level with am labs  Erin Hearing PharmD., BCPS Clinical Pharmacist  Please check AMION for all [place] phone numbers After 10:00 PM, call [place] [phone] 06/08/2019, 7:59 PM

## 2019-06-09 ENCOUNTER — Encounter (HOSPITAL_COMMUNITY)
Admission: EM | Disposition: A | Discharge status: Home / Self Care | Payer: Self-pay | Source: Home / Self Care | Attending: Family Medicine

## 2019-06-09 ENCOUNTER — Telehealth: Payer: Self-pay | Admitting: *Deleted

## 2019-06-09 DIAGNOSIS — I82402 Acute embolism and thrombosis of unspecified deep veins of left lower extremity: Secondary | ICD-10-CM

## 2019-06-09 DIAGNOSIS — I82422 Acute embolism and thrombosis of left iliac vein: Secondary | ICD-10-CM

## 2019-06-09 DIAGNOSIS — I871 Compression of vein: Secondary | ICD-10-CM

## 2019-06-09 HISTORY — PX: PERIPHERAL VASCULAR INTERVENTION: CATH118257

## 2019-06-09 HISTORY — PX: PERIPHERAL VASCULAR THROMBECTOMY: CATH118306

## 2019-06-09 HISTORY — PX: INTRAVASCULAR ULTRASOUND/IVUS: CATH118244

## 2019-06-09 LAB — BASIC METABOLIC PANEL
Anion gap: 10 (ref 5–15)
BUN: 10 mg/dL (ref 6–20)
CO2: 22 mmol/L (ref 22–32)
Calcium: 8.8 mg/dL — ABNORMAL LOW (ref 8.9–10.3)
Chloride: 104 mmol/L (ref 98–111)
Creatinine, Ser: 0.87 mg/dL (ref 0.44–1.00)
GFR calc Af Amer: >60 mL/min (ref >60)
GFR calc non Af Amer: >60 mL/min (ref >60)
Glucose, Bld: 81 mg/dL (ref 70–99)
Potassium: 3.4 mmol/L — ABNORMAL LOW (ref 3.5–5.1)
Sodium: 136 mmol/L (ref 135–145)

## 2019-06-09 LAB — CBC
HCT: 30.9 % — ABNORMAL LOW (ref 36.0–46.0)
Hemoglobin: 10.6 g/dL — ABNORMAL LOW (ref 12.0–15.0)
MCH: 29.9 pg (ref 26.0–34.0)
MCHC: 34.3 g/dL (ref 30.0–36.0)
MCV: 87 fL (ref 80.0–100.0)
Platelets: 296 10*3/uL (ref 150–400)
RBC: 3.55 MIL/uL — ABNORMAL LOW (ref 3.87–5.11)
RDW: 13.7 % (ref 11.5–15.5)
WBC: 7.3 10*3/uL (ref 4.0–10.5)
nRBC: 0 % (ref 0.0–0.2)

## 2019-06-09 LAB — HEPARIN LEVEL (UNFRACTIONATED): Heparin Unfractionated: 0.43 IU/mL (ref 0.30–0.70)

## 2019-06-09 LAB — POCT ACTIVATED CLOTTING TIME: Activated Clotting Time: 296 seconds

## 2019-06-09 SURGERY — PERIPHERAL VASCULAR THROMBECTOMY
Anesthesia: LOCAL

## 2019-06-09 MED ORDER — HYDRALAZINE HCL 20 MG/ML IJ SOLN: 5.0000 mg | INTRAMUSCULAR | Status: DC | PRN

## 2019-06-09 MED ORDER — FENTANYL CITRATE (PF) 100 MCG/2ML IJ SOLN
INTRAMUSCULAR | Status: AC
  Filled 2019-06-09: qty 2

## 2019-06-09 MED ORDER — MIDAZOLAM HCL 2 MG/2ML IJ SOLN
INTRAMUSCULAR | Status: DC | PRN
  Administered 2019-06-09 (×2): 2 mg via INTRAVENOUS

## 2019-06-09 MED ORDER — SODIUM CHLORIDE 0.9 % IV SOLN: 250.0000 mL | INTRAVENOUS | Status: DC | PRN

## 2019-06-09 MED ORDER — HEPARIN SODIUM (PORCINE) 1000 UNIT/ML IJ SOLN
INTRAMUSCULAR | Status: DC | PRN
  Administered 2019-06-09: 6000 [IU] via INTRAVENOUS
  Administered 2019-06-09: 12000 [IU] via INTRAVENOUS

## 2019-06-09 MED ORDER — SODIUM CHLORIDE 0.9% FLUSH
3.0000 mL | Freq: Two times a day (BID) | INTRAVENOUS | Status: DC
  Administered 2019-06-09 – 2019-06-10 (×2): 3 mL via INTRAVENOUS

## 2019-06-09 MED ORDER — HEPARIN (PORCINE) IN NACL 1000-0.9 UT/500ML-% IV SOLN
INTRAVENOUS | Status: AC
  Filled 2019-06-09: qty 500

## 2019-06-09 MED ORDER — SENNOSIDES-DOCUSATE SODIUM 8.6-50 MG PO TABS: 1.0000 | ORAL_TABLET | Freq: Two times a day (BID) | ORAL | Status: DC | PRN

## 2019-06-09 MED ORDER — POLYETHYLENE GLYCOL 3350 17 G PO PACK: 17.0000 g | PACK | Freq: Two times a day (BID) | ORAL | Status: DC | PRN

## 2019-06-09 MED ORDER — MIDAZOLAM HCL 2 MG/2ML IJ SOLN
INTRAMUSCULAR | Status: AC
  Filled 2019-06-09: qty 2

## 2019-06-09 MED ORDER — ACETAMINOPHEN 325 MG PO TABS
650.0000 mg | ORAL_TABLET | ORAL | Status: DC | PRN
  Administered 2019-06-09: 21:00:00 650 mg via ORAL
  Filled 2019-06-09: qty 2

## 2019-06-09 MED ORDER — MORPHINE SULFATE (PF) 2 MG/ML IV SOLN: 2.0000 mg | INTRAVENOUS | Status: DC | PRN

## 2019-06-09 MED ORDER — LIDOCAINE HCL (PF) 1 % IJ SOLN
INTRAMUSCULAR | Status: DC | PRN
  Administered 2019-06-09: 5 mL via INTRADERMAL

## 2019-06-09 MED ORDER — LABETALOL HCL 5 MG/ML IV SOLN: 10.0000 mg | INTRAVENOUS | Status: DC | PRN

## 2019-06-09 MED ORDER — HEPARIN SODIUM (PORCINE) 1000 UNIT/ML IJ SOLN
INTRAMUSCULAR | Status: AC
  Filled 2019-06-09: qty 1

## 2019-06-09 MED ORDER — LIDOCAINE HCL (PF) 1 % IJ SOLN
INTRAMUSCULAR | Status: AC
  Filled 2019-06-09: qty 30

## 2019-06-09 MED ORDER — IODIXANOL 320 MG/ML IV SOLN
INTRAVENOUS | Status: DC | PRN
  Administered 2019-06-09: 30 mL via INTRAVENOUS

## 2019-06-09 MED ORDER — HEPARIN (PORCINE) IN NACL 1000-0.9 UT/500ML-% IV SOLN
INTRAVENOUS | Status: DC | PRN
  Administered 2019-06-09: 500 mL

## 2019-06-09 MED ORDER — FENTANYL CITRATE (PF) 100 MCG/2ML IJ SOLN
INTRAMUSCULAR | Status: DC | PRN
  Administered 2019-06-09: 50 ug via INTRAVENOUS
  Administered 2019-06-09 (×3): 25 ug via INTRAVENOUS
  Administered 2019-06-09: 50 ug via INTRAVENOUS

## 2019-06-09 MED ORDER — ONDANSETRON HCL 4 MG/2ML IJ SOLN: 4.0000 mg | Freq: Four times a day (QID) | INTRAMUSCULAR | Status: DC | PRN

## 2019-06-09 MED ORDER — SODIUM CHLORIDE 0.9% FLUSH: 3.0000 mL | INTRAVENOUS | Status: DC | PRN

## 2019-06-09 MED ORDER — HYDRALAZINE HCL 20 MG/ML IJ SOLN: 10.0000 mg | Freq: Four times a day (QID) | INTRAMUSCULAR | Status: DC | PRN

## 2019-06-09 MED ORDER — SODIUM CHLORIDE 0.9 % IV SOLN
INTRAVENOUS | Status: DC
  Administered 2019-06-09: 08:00:00 via INTRAVENOUS

## 2019-06-09 MED ORDER — HEPARIN (PORCINE) 25000 UT/250ML-% IV SOLN
1050.0000 [IU]/h | INTRAVENOUS | Status: AC
  Administered 2019-06-09: 1150 [IU]/h via INTRAVENOUS

## 2019-06-09 MED ORDER — SODIUM CHLORIDE 0.9 % IV SOLN: INTRAVENOUS | Status: AC

## 2019-06-09 MED ORDER — OXYCODONE HCL 5 MG PO TABS: 5.0000 mg | ORAL_TABLET | ORAL | Status: DC | PRN

## 2019-06-09 SURGICAL SUPPLY — 27 items
BAG SNAP BAND KOVER 36X36 (MISCELLANEOUS) ×1 IMPLANT
BALLN ATLAS 16X40X75 (BALLOONS) ×3
BALLN MUSTANG 10X80X75 (BALLOONS) ×3
BALLN MUSTANG 12.0X40 75 (BALLOONS) ×3
BALLOON ATLAS 16X40X75 (BALLOONS) IMPLANT
BALLOON MUSTANG 10X80X75 (BALLOONS) IMPLANT
BALLOON MUSTANG 12.0X40 75 (BALLOONS) IMPLANT
CATH ANGIO 5F BER2 100CM (CATHETERS) ×1 IMPLANT
CATH RETRIEVER CLOT 16MMX105CM (CATHETERS) ×1 IMPLANT
CATH VISIONS PV .035 IVUS (CATHETERS) ×1 IMPLANT
GLIDEWIRE ANGLED SS 035X260CM (WIRE) ×1 IMPLANT
GLIDEWIRE NITREX 0.018X80X5 (WIRE) ×1
GUIDEWIRE NITREX 0.018X80X5 (WIRE) IMPLANT
KIT ENCORE 26 ADVANTAGE (KITS) ×1 IMPLANT
KIT HEART LEFT (KITS) ×3 IMPLANT
KIT MICROPUNCTURE NIT STIFF (SHEATH) ×1 IMPLANT
PACK CARDIAC CATHETERIZATION (CUSTOM PROCEDURE TRAY) ×3 IMPLANT
PROTECTION STATION PRESSURIZED (MISCELLANEOUS) ×3
SHEATH CLOT RETRIEVER (SHEATH) ×1 IMPLANT
SHEATH PINNACLE 8F 10CM (SHEATH) ×1 IMPLANT
SHEATH PROBE COVER 6X72 (BAG) ×1 IMPLANT
STATION PROTECTION PRESSURIZED (MISCELLANEOUS) IMPLANT
STENT VICI VENOUS 16X90 (Permanent Stent) ×1 IMPLANT
TRANSDUCER W/STOPCOCK (MISCELLANEOUS) ×3 IMPLANT
WIRE AMPLATZ SS-J .035X260CM (WIRE) ×1 IMPLANT
WIRE HITORQ VERSACORE ST 145CM (WIRE) ×1 IMPLANT
WIRE TORQFLEX AUST .018X40CM (WIRE) ×1 IMPLANT

## 2019-06-09 NOTE — Care Management Important Message (Signed)
Important Message  Patient Details  Name: Patricia Davila MRN: [id number] Date of Birth: [birth year]   Medicare Important Message Given:  Yes     Shelda Altes 06/09/2019, 3:08 PM

## 2019-06-09 NOTE — Op Note (Signed)
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  The patient was placed supine exertion on the table.  She was then flipped to a prone position.  Local anesthesia was infiltrated in the posterior aspect of the left knee over the area of the popliteal vein.  Ultrasound was used to identify the popliteal vein and artery.  A micropuncture needle was used to cannulate the popliteal vein.  Micropuncture wire was advanced into this.  Micropuncture sheath was then advanced over the guidewire.  We confirmed we were intravenous rather than arterial.  At this point an 035 versa core wire was advanced up to the level of the iliac vein.  A 9 French sheath was then advanced over this.  I was unable to get the wire to initially cross the common iliac venous origin into the cava so an 035 angled Glidewire as well as a Berenstein catheter were used to get the wire to cross up into the inferior vena cava.  An inferior venacavogram was performed for roadmapping technique for this.  Additionally once the wire had crossed up into the superior vena cava to make sure that we had the wire out into the subclavian vein I did a superior venacavogram to determine precise location of the left subclavian vein and the Glidewire was advanced into this and the catheter over it and then I swapped out the Glidewire for an 035 Amplatz wire.  Intravascular ultrasound was then performed from the renal veins all the way down to the  popliteal vein.  This showed some clot in the cava just above the iliac vein origins.  There was a very tight stenosis at the origin of the left iliac vein and then there was thrombus below this as well.  The sheath in the popliteal fossa was then swapped out for inari sheath.  The patient was given 12,000 units of heparin.  She was given additional 6000 units of heparin during the course of the case.  ACT was confirmed to be 293.  We then advanced the NRA device up past the level of the inferior vena cava clot and deployed this in 2 passes were made with retrieval of a large amount of thrombus.  We then repeated the IVUS which showed the caval clot have been cleared.  We then did 2 more passes with the NRA just below the level of the stenosis to clear all the clot below this again IVUS was performed and we also determined location for stenting of the iliac vein segment.  A 15 x 9 V ICI stent was selected.  Using IVUS and roadmapping techniques we determined the exact location of the narrowing and deployed the stent after serial balloon dilations with a 10 and 12 angioplasty balloon to nominal pressure for 30 seconds.  The stent was postdilated with a 16 mm balloon.  Completion IVUS showed the stent was well opposed to the wall and fully opened.  At this point the sheaths and guidewires were all removed and hemostasis obtained with direct pressure.  The patient tired procedure well  and there were no complications.  The patient was taken to the holding area in stable condition.  Operative management: The patient's heparin will be restarted in 2 hours.  She will need to be on oral anticoagulation for at least 6 months.  She will need to see me in follow-up in a few weeks with an ileocaval duplex.   Ruta Hinds, MD [facility] Office: [phone]

## 2019-06-09 NOTE — Telephone Encounter (Signed)
Documentation of need for oral anticoagulation for at least 6 months.

## 2019-06-09 NOTE — Telephone Encounter (Signed)
-----   Message from Elam Dutch, MD sent at 06/09/2019  3:58 PM EDT ----- Procedure: Superior venacavogram, lower extremity venogram, IVUS inferior vena cava, and external femoral popliteal vein, left common iliac venous stent (15 x 9 V ICI), mechanical thrombectomy ivc iliac femoral popliteal  She will need to be on oral anticoagulation for at least 6 months.  She will need to see me in follow-up in a few weeks with an ileocaval duplex.   Ruta Hinds, MD [facility] Office: [phone]

## 2019-06-09 NOTE — Interval H&P Note (Signed)
History and Physical Interval Note:  06/09/2019 12:21 PM  Patricia Davila  has presented today for surgery, with the diagnosis of DVT.  The various methods of treatment have been discussed with the patient and family. After consideration of risks, benefits and other options for treatment, the patient has consented to  Procedure(s): PERIPHERAL VASCULAR THROMBECTOMY (Left) INTRAVASCULAR ULTRASOUND/IVUS (N/A) as a surgical intervention.  The patient's history has been reviewed, patient examined, no change in status, stable for surgery.  I have reviewed the patient's chart and labs.  Questions were answered to the patient's satisfaction.     Ruta Hinds

## 2019-06-09 NOTE — Progress Notes (Addendum)
[place] for Heparin Indication: pulmonary embolus  Allergies  Allergen Reactions  . Ativan [Lorazepam] Anxiety    HALLUCINATIONS  . Tylenol With Codeine #3 [Acetaminophen-Codeine] Hives and Itching    Patient Measurements: Height: 5\' 1"  (154.9 cm) Weight: 220 lb 12.8 oz (100.2 kg) IBW/kg (Calculated) : 47.8 Heparin Dosing Weight: 68 kg  Vital Signs: Temp: 98.6 F (37 C) (10/23 0442) Temp Source: Oral (10/23 0442) BP: 131/77 (10/23 0442) Pulse Rate: 75 (10/23 0442)  Labs: Recent Labs    06/07/19 1531  06/08/19 0352 06/08/19 0740 06/08/19 1039 06/08/19 1925 06/09/19 0412  HGB 11.0*  --  10.4*  --   --   --  10.6*  HCT 32.7*  --  31.3*  --   --   --  30.9*  PLT 320  --  312  --   --   --  296  HEPARINUNFRC  --    < > 0.90*  --  0.97* 0.79* 0.43  CREATININE 1.03*  --  0.86  --   --   --  0.87  TROPONINIHS  --   --   --  5 3  --   --    < > = values in this interval not displayed.    Estimated Creatinine Clearance: 85.9 mL/min (by C-G formula based on SCr of 0.87 mg/dL).  Assessment: 48 y.o. female with DVT/PE for heparin. No AC PTA.  Heparin level came back therapeutic at 0.43, on 1150 units/hr.  Hgb 10.6, plt 296. No s/sx of bleeding. No infusion issues.   Goal of Therapy:  Heparin level 0.3-0.7 units/ml Monitor platelets by anticoagulation protocol: Yes   Plan:  Continue heparin at 1150 units/hr Monitor daily heparin level, CBC, and for s/sx of bleeding  F/u plan for DOAC   Antonietta Jewel, PharmD, [facility] Pharmacist  Phone: [phone]  Please check AMION for all [facility] phone numbers After 10:00 PM, call [place] [phone] 06/09/2019, 7:24 AM

## 2019-06-09 NOTE — Progress Notes (Signed)
PROGRESS NOTE    Patricia Davila  [id number] DOB: 1971/08/08 DOA: 06/07/2019 PCP: Clent Demark, PA-C      Brief Narrative:  Mrs. Kellar is a 48 y.o. F with HTN, chart hx Bipolar, childhood asthma, and recent trimalleolar fracture who presented with 1 week LLE swelling as well as SOB.  Her orthopedist ordered a venous LLE doppler that showed DVT and she was sent to ER.  In the ER, repeat doppler confirmed extensive LLE DVT.  CTA showed PE without strain.  Vascular surgery was consulted and heparin was started.      Assessment & Plan:  Extensive LLE DVT First provoked VTE episode.  Underwent uncomplicated thrombectomy and stent of May Thurner lesion today. -Continue heparin gtt for now, running benefits cehck on Xarelto, will plan to start it in AM, for 6 months -Consult Vascular surgery, appreciate expert cares   Pulmonary embolism PESI 48, very low risk. ECho showed normal EF, normal RV function. -Continue heparin gtt for now -Consult SW re: DOAC coverage  Hypertension BP well controlled -Continue ARB and HCTZ  Recent ORIF with nonhealing wound Smoking cessation recommended  GERD  -Continue pantoprazole  Hypokalemia Mag normal, K improved   Anemia, normocytic Hgb stable, no clinical bleeding      MDM and disposition: The below labs and imaging reports reviewed and summarized above.  Medication management as above.  Including anticoagulation.  The patient was admitted with extensive DVT and PE.  Vascular surgery will be consulted regarding thrombectomy of her lega nd possibly stenting.  Continue IV heparin       DVT prophylaxis: N/A Code Status: FULL Family Communication:     Consultants:   Vascular surgery  Procedures:   10/21 CTA chest -- PE  10/21 doppler LLE -- extensive occlusive clot  10/22 Echo  IMPRESSIONS    1. Left ventricular ejection fraction, by visual estimation, is 60 to 65%. The left ventricle has normal  function. There is mildly increased left ventricular hypertrophy.  2. Left ventricular diastolic Doppler parameters are consistent with impaired relaxation pattern of LV diastolic filling.  3. Global right ventricle has normal systolic function.The right ventricular size is normal. No increase in right ventricular wall thickness.  4. No evidence of RV strain.  5. Left atrial size was normal.  6. Right atrial size was normal.  7. The mitral valve is abnormal. Trace mitral valve regurgitation.  8. The tricuspid valve is grossly normal. Tricuspid valve regurgitation was not visualized by color flow Doppler.  9. The aortic valve is tricuspid Aortic valve regurgitation was not visualized by color flow Doppler. 10. The pulmonic valve was grossly normal. Pulmonic valve regurgitation is not visualized by color flow Doppler. 11. The inferior vena cava is normal in size with greater than 50% respiratory variability, suggesting right atrial pressure of 3 mmHg. 12. Cannot exclude PFO.   10/23 thrombectomy    Subjective: No chest pain, no dyspnea.  Leg swelling already better after procdure.  No vomiting, no confusion, no headache.  Objective: Vitals:   06/09/19 1514 06/09/19 1519 06/09/19 1524 06/09/19 1529  BP: (!) 169/87 (!) 166/90 (!) 163/96 (!) 166/92  Pulse: 67 64 60 (!) 59  Resp: 15 10 17 11   Temp:      TempSrc:      SpO2: (!) 0% 97% 100% 97%  Weight:      Height:        Intake/Output Summary (Last 24 hours) at 06/09/2019 1732 Last data filed at  06/09/2019 1200 Gross per 24 hour  Intake 472.4 ml  Output --  Net 472.4 ml   Filed Weights   06/07/19 2159 06/07/19 2243 06/09/19 0442  Weight: 101 kg 100 kg 100.2 kg    Examination: General appearance: Adult female, sitting on the edge of the bed, no acute distress, conversational, in no acute distress. HEENT: Anicteric, conjunctival pink, lids and lashes normal.  No nasal deformity, discharge, or epistaxis.  Lips moist,  edentulous, oropharynx moist, no oral lesions, hearing normal.   Skin: Skin warm and dry, left leg reddish and swollen Cardiac: RRR, no murmurs, no lower extremity edema except on the left leg, JVP not visible Respiratory: Normal respiratory rate and rhythm, lungs clear without rales or wheezes   MSK: Left leg swollen, otherwise no deformities or effusions of the large joints, normal muscle bulk and tone Neuro: Awake and alert, extraocular movements intact, moves all extremities with normal strength and coordination, speech fluent.    Psych: Sensorium intact and responding to questions, attention normal, affect normal, judgment insight appear normal.    Data Reviewed: I have personally reviewed following labs and imaging studies:  CBC: Recent Labs  Lab 06/07/19 1531 06/08/19 0352 06/09/19 0412  WBC 9.1 8.6 7.3  HGB 11.0* 10.4* 10.6*  HCT 32.7* 31.3* 30.9*  MCV 89.1 87.4 87.0  PLT 320 312 0000000   Basic Metabolic Panel: Recent Labs  Lab 06/07/19 1531 06/08/19 0352 06/08/19 0740 06/09/19 0412  NA 140 140  --  136  K 3.5 2.8*  --  3.4*  CL 104 106  --  104  CO2 26 26  --  22  GLUCOSE 93 91  --  81  BUN 15 12  --  10  CREATININE 1.03* 0.86  --  0.87  CALCIUM 8.9 8.6*  --  8.8*  MG  --   --  1.9  --    GFR: Estimated Creatinine Clearance: 85.9 mL/min (by C-G formula based on SCr of 0.87 mg/dL). Liver Function Tests: No results for input(s): AST, ALT, ALKPHOS, BILITOT, PROT, ALBUMIN in the last 168 hours. No results for input(s): LIPASE, AMYLASE in the last 168 hours. No results for input(s): AMMONIA in the last 168 hours. Coagulation Profile: No results for input(s): INR, PROTIME in the last 168 hours. Cardiac Enzymes: No results for input(s): CKTOTAL, CKMB, CKMBINDEX, TROPONINI in the last 168 hours. BNP (last 3 results) No results for input(s): PROBNP in the last 8760 hours. HbA1C: No results for input(s): HGBA1C in the last 72 hours. CBG: No results for input(s):  GLUCAP in the last 168 hours. Lipid Profile: No results for input(s): CHOL, HDL, LDLCALC, TRIG, CHOLHDL, LDLDIRECT in the last 72 hours. Thyroid Function Tests: No results for input(s): TSH, T4TOTAL, FREET4, T3FREE, THYROIDAB in the last 72 hours. Anemia Panel: No results for input(s): VITAMINB12, FOLATE, FERRITIN, TIBC, IRON, RETICCTPCT in the last 72 hours. Urine analysis:    Component Value Date/Time   COLORURINE YELLOW 03/23/2017 [place] 03/23/2017 1235   LABSPEC 1.019 03/23/2017 1235   PHURINE 5.0 03/23/2017 1235   GLUCOSEU NEGATIVE 03/23/2017 1235   HGBUR NEGATIVE 03/23/2017 1235   HGBUR negative 05/13/2010 1036   BILIRUBINUR NEGATIVE 03/23/2017 1235   KETONESUR NEGATIVE 03/23/2017 1235   PROTEINUR NEGATIVE 03/23/2017 1235   UROBILINOGEN 0.2 04/21/2013 0039   NITRITE NEGATIVE 03/23/2017 1235   LEUKOCYTESUR NEGATIVE 03/23/2017 1235   Sepsis Labs: @LABRCNTIP (procalcitonin:4,lacticacidven:4)  ) Recent Results (from the past 240 hour(s))  SARS  Coronavirus 2 by RT PCR (hospital order, performed in [facility] hospital lab) Nasopharyngeal Nasopharyngeal Swab     Status: None   Collection Time: 06/07/19  9:06 PM   Specimen: Nasopharyngeal Swab  Result Value Ref Range Status   SARS Coronavirus 2 NEGATIVE NEGATIVE Final    Comment: (NOTE) If result is NEGATIVE SARS-CoV-2 target nucleic acids are NOT DETECTED. The SARS-CoV-2 RNA is generally detectable in upper and lower  respiratory specimens during the acute phase of infection. The lowest  concentration of SARS-CoV-2 viral copies this assay can detect is 250  copies / mL. A negative result does not preclude SARS-CoV-2 infection  and should not be used as the sole basis for treatment or other  patient management decisions.  A negative result may occur with  improper specimen collection / handling, submission of specimen other  than nasopharyngeal swab, presence of viral mutation(s) within the  areas  targeted by this assay, and inadequate number of viral copies  (<250 copies / mL). A negative result must be combined with clinical  observations, patient history, and epidemiological information. If result is POSITIVE SARS-CoV-2 target nucleic acids are DETECTED. The SARS-CoV-2 RNA is generally detectable in upper and lower  respiratory specimens dur ing the acute phase of infection.  Positive  results are indicative of active infection with SARS-CoV-2.  Clinical  correlation with patient history and other diagnostic information is  necessary to determine patient infection status.  Positive results do  not rule out bacterial infection or co-infection with other viruses. If result is PRESUMPTIVE POSTIVE SARS-CoV-2 nucleic acids MAY BE PRESENT.   A presumptive positive result was obtained on the submitted specimen  and confirmed on repeat testing.  While 2019 novel coronavirus  (SARS-CoV-2) nucleic acids may be present in the submitted sample  additional confirmatory testing may be necessary for epidemiological  and / or clinical management purposes  to differentiate between  SARS-CoV-2 and other Sarbecovirus currently known to infect humans.  If clinically indicated additional testing with an alternate test  methodology [phone]) is advised. The SARS-CoV-2 RNA is generally  detectable in upper and lower respiratory sp ecimens during the acute  phase of infection. The expected result is Negative. Fact Sheet for Patients:  StrictlyIdeas.no Fact Sheet for Healthcare Providers: BankingDealers.co.za This test is not yet approved or cleared by the [place] FDA and has been authorized for detection and/or diagnosis of SARS-CoV-2 by FDA under an Emergency Use Authorization (EUA).  This EUA will remain in effect (meaning this test can be used) for the duration of the COVID-19 declaration under Section 564(b)(1) of the Act, 21 U.S.C. section  360bbb-3(b)(1), unless the authorization is terminated or revoked sooner. Performed at [facility], [place] [address] [place], [place] [address]          Radiology Studies: Ct Angio Chest Pe W And/or Wo Contrast  Result Date: 06/07/2019 CLINICAL DATA:  Known clots in the leg, short of breath EXAM: CT ANGIOGRAPHY CHEST WITH CONTRAST TECHNIQUE: Multidetector CT imaging of the chest was performed using the standard protocol during bolus administration of intravenous contrast. Multiplanar CT image reconstructions and MIPs were obtained to evaluate the vascular anatomy. CONTRAST:  152mL OMNIPAQUE IOHEXOL 350 MG/ML SOLN COMPARISON:  Chest x-ray 06/07/2019 FINDINGS: Cardiovascular: Satisfactory opacification of the pulmonary arteries to the segmental level. Acute thrombus within right inter lobar pulmonary artery, with extension of thrombus into right lower lobe segmental and subsegmental vessels. RV LV ratio is non elevated. Nonaneurysmal aorta. No  dissection seen. Borderline to mild cardiomegaly. No pericardial effusion. Mediastinum/Nodes: No enlarged mediastinal, hilar, or axillary lymph nodes. Thyroid gland, trachea, and esophagus demonstrate no significant findings. Lungs/Pleura: Lungs are clear. No pleural effusion or pneumothorax. Upper Abdomen: No acute abnormality. Musculoskeletal: No chest wall abnormality. No acute or significant osseous findings. Review of the MIP images confirms the above findings. IMPRESSION: Positive for acute pulmonary embolus involving right descending pulmonary artery and right lower lobe segmental and subsegmental vessels. Negative for right heart strain with non elevated RV LV ratio. Clear lung fields \ Critical Value/emergent results were called by telephone at the time of interpretation on 06/07/2019 at 8:41 pm to providerANKIT NANAVATI , who verbally acknowledged these results. Electronically Signed   By: Donavan Foil M.D.   On: 06/07/2019 20:41         Scheduled Meds:  hydrochlorothiazide  25 mg Oral Daily   influenza vac split quadrivalent PF  0.5 mL Intramuscular Tomorrow-1000   losartan  50 mg Oral Daily   pantoprazole  40 mg Oral Daily   sodium chloride flush  3 mL Intravenous Q12H   Continuous Infusions:  sodium chloride 100 mL/hr at 06/09/19 0750   sodium chloride     sodium chloride     heparin       LOS: 2 days    Time spent: 35 minutes    Edwin Dada, MD [facility] 06/09/2019, 5:32 PM     Please page through [place]:  [email or web address] Password [email or web address] If 7PM-7AM, please contact night-coverage

## 2019-06-09 NOTE — Progress Notes (Addendum)
Patricia Davila for Heparin Indication: pulmonary embolus  Allergies  Allergen Reactions  . Ativan [Lorazepam] Anxiety    HALLUCINATIONS  . Tylenol With Codeine #3 [Acetaminophen-Codeine] Hives and Itching    Patient Measurements: Height: 5\' 1"  (154.9 cm) Weight: 220 lb 12.8 oz (100.2 kg) IBW/kg (Calculated) : 47.8 Heparin Dosing Weight: 68 kg  Vital Signs: Temp: 98.6 F (37 C) (10/23 0442) Temp Source: Oral (10/23 0442) BP: 166/92 (10/23 1529) Pulse Rate: 59 (10/23 1529)  Labs: Recent Labs    06/07/19 1531  06/08/19 0352 06/08/19 0740 06/08/19 1039 06/08/19 1925 06/09/19 0412  HGB 11.0*  --  10.4*  --   --   --  10.6*  HCT 32.7*  --  31.3*  --   --   --  30.9*  PLT 320  --  312  --   --   --  296  HEPARINUNFRC  --    < > 0.90*  --  0.97* 0.79* 0.43  CREATININE 1.03*  --  0.86  --   --   --  0.87  TROPONINIHS  --   --   --  5 3  --   --    < > = values in this interval not displayed.    Estimated Creatinine Clearance: 85.9 mL/min (by C-G formula based on SCr of 0.87 mg/dL).  Assessment: 48 y.o. female with DVT/PE for heparin. No anticoagulants PTA.  Heparin level was therapeutic at 0.43 units/ml earlier today, on 1150 units/hr.  Hgb 10.6, plt 296. No s/sx of bleeding. No infusion issues.  Pt is S/P superior venacavogram, lower extremity venogram, IVUS inferior vena cava, and external femoral popliteal vein, left common iliac venous stent (15 x 9 V ICI), mechanical thrombectomy this afternoon. Pharmacy is consulted to restart heparin infusion 2 hrs after sheath removal. Per procedure log, sheath was removed at 15:27 this afternoon (10/23). No issues with IV or bleeding, per RN.  Goal of Therapy:  Heparin level 0.3-0.7 units/ml Monitor platelets by anticoagulation protocol: Yes   Plan:  Restart heparin at previously therapeutic rate of 1150 units/hr 2 hrs after sheath removal (restart at 17:30 PM on 10/23); RN notified Monitor  heparin level 6 hrs after restarting infusion Monitor daily heparin level, CBC, and for s/sx of bleeding  F/U plan for DOAC   Gillermina Hu, PharmD, BCPS, [facility] Clinical Pharmacist 06/09/2019, 3:56 PM

## 2019-06-10 DIAGNOSIS — I10 Essential (primary) hypertension: Secondary | ICD-10-CM | POA: Diagnosis not present

## 2019-06-10 LAB — BASIC METABOLIC PANEL
Anion gap: 9 (ref 5–15)
BUN: 7 mg/dL (ref 6–20)
CO2: 22 mmol/L (ref 22–32)
Calcium: 8.8 mg/dL — ABNORMAL LOW (ref 8.9–10.3)
Chloride: 106 mmol/L (ref 98–111)
Creatinine, Ser: 0.84 mg/dL (ref 0.44–1.00)
GFR calc Af Amer: >60 mL/min (ref >60)
GFR calc non Af Amer: >60 mL/min (ref >60)
Glucose, Bld: 97 mg/dL (ref 70–99)
Potassium: 3.3 mmol/L — ABNORMAL LOW (ref 3.5–5.1)
Sodium: 137 mmol/L (ref 135–145)

## 2019-06-10 LAB — CBC
HCT: 30.1 % — ABNORMAL LOW (ref 36.0–46.0)
Hemoglobin: 10.1 g/dL — ABNORMAL LOW (ref 12.0–15.0)
MCH: 29.1 pg (ref 26.0–34.0)
MCHC: 33.6 g/dL (ref 30.0–36.0)
MCV: 86.7 fL (ref 80.0–100.0)
Platelets: 276 10*3/uL (ref 150–400)
RBC: 3.47 MIL/uL — ABNORMAL LOW (ref 3.87–5.11)
RDW: 13.7 % (ref 11.5–15.5)
WBC: 9 10*3/uL (ref 4.0–10.5)
nRBC: 0 % (ref 0.0–0.2)

## 2019-06-10 LAB — HEPARIN LEVEL (UNFRACTIONATED): Heparin Unfractionated: 0.78 IU/mL — ABNORMAL HIGH (ref 0.30–0.70)

## 2019-06-10 MED ORDER — RIVAROXABAN 15 MG PO TABS
15.0000 mg | ORAL_TABLET | Freq: Two times a day (BID) | ORAL | Status: DC
  Administered 2019-06-10: 15 mg via ORAL
  Filled 2019-06-10: qty 1

## 2019-06-10 MED ORDER — RIVAROXABAN (XARELTO) VTE STARTER PACK (15 & 20 MG): ORAL_TABLET | ORAL | 0 refills | Status: DC

## 2019-06-10 NOTE — Discharge Summary (Signed)
Physician Discharge Summary  Patricia Davila [id number] DOB: Dec 16, 1970 DOA: 06/07/2019  PCP: Clent Demark, PA-C  Admit date: 06/07/2019 Discharge date: 06/10/2019  Admitted From: Home Disposition:  Home   Recommendations for Outpatient Follow-up:  1. Follow up with PCP in 1-2 weeks 2. Follow up with Vascular surgery as directed     Home Health: None  Equipment/Devices: None  Discharge Condition: Good  CODE STATUS: FULL Diet recommendation: Regular  Brief/Interim Summary: Patricia Davila is a 48 y.o. F with HTN, chart hx Bipolar, childhood asthma, and recent trimalleolar fracture who presented with 1 week LLE swelling as well as SOB.  Her orthopedist ordered a venous LLE doppler that showed DVT and she was sent to ER.  In the ER, repeat doppler confirmed extensive LLE DVT.  CTA showed PE without strain.  Vascular surgery was consulted and heparin was started.     PRINCIPAL HOSPITAL DIAGNOSIS: Acute PE and DVT, first time       Discharge Diagnoses:   Extensive LLE DVT Acute pulmonary embolism First provoked VTE episode.  PESI 48, very low risk.  ECho showed normal LV and RV function.  Admitted and started on heparin gtt.  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  Transitioned to Xarelto with plan for 6 months anticoagulation, Vascular surgery follow up.        Hypertension BP well controlled  Recent ORIF with nonhealing wound Smoking cessation recommended  GERD   Hypokalemia Mag normal, K improved   Anemia, normocytic Hgb stable, no clinical bleeding          Discharge Instructions  Discharge Instructions    Discharge instructions   Complete by: As directed    From Dr. Loleta Books: You were admitted for a large blood clot in the left leg, and a small piece that broke off to drift up to the lungs.  Thankfully, the piece in your lungs is putting no stress on your heart. You were started on a blood  thinner promptly, and this will prevent the clot from getting bigger, and allow your body to dissolve the rest of it.  Take rivaroxaban/Xarelto 15 mg twice daily for 3 weeks (21 days)  Then change to rivaroxaban/Xarelto 20 mg nightly   Take this blood thinner Xarelto for 6 months, then stop if you havent had any more clots Follow up with Dr. Oneida Alar as he recommended, his number is below in the To Do section  Also call your primary care doctor for a close follow up appointment  I recommend you not take any naproxen or other NSAIDs while you are taking Xarelto   Increase activity slowly   Complete by: As directed      Allergies as of 06/10/2019      Reactions   Ativan [lorazepam] Anxiety   HALLUCINATIONS   Tylenol With Codeine #3 [acetaminophen-codeine] Hives, Itching      Medication List    STOP taking these medications   aspirin EC 81 MG tablet   naproxen 500 MG tablet Commonly known as: NAPROSYN     TAKE these medications   calcium-vitamin D 500-200 MG-UNIT tablet Commonly known as: OSCAL WITH D Take 1 tablet by mouth 3 (three) times daily.   cyclobenzaprine 5 MG tablet Commonly known as: FLEXERIL Take 1-2 tablets (5-10 mg total) by mouth 3 (three) times daily as needed for muscle spasms.   hydrochlorothiazide 25 MG tablet Commonly known as: HYDRODIURIL Take 1 tablet (25 mg total) by mouth daily. Take  on tablet in the morning. Notes to patient: Take Sunday morning   HYDROcodone-acetaminophen 5-325 MG tablet Commonly known as: Norco Take 1 tablet by mouth daily as needed for moderate pain.   losartan 50 MG tablet Commonly known as: COZAAR Take 1 tablet (50 mg total) by mouth daily.   methocarbamol 750 MG tablet Commonly known as: ROBAXIN Take 1 tablet (750 mg total) by mouth 2 (two) times daily as needed for muscle spasms.   mupirocin ointment 2 % Commonly known as: Bactroban Apply 1 application topically 2 (two) times daily.   omeprazole 40 MG  capsule Commonly known as: PRILOSEC Take 1 capsule (40 mg total) by mouth daily.   Rivaroxaban 15 & 20 MG Tbpk Follow package directions: Take one 15mg  tablet by mouth twice a day. On day 22, switch to one 20mg  tablet once a day. Take with food.   Vitamin D 50 MCG (2000 UT) tablet Take 1 tablet (2,000 Units total) by mouth daily.      Follow-up Information    Clent Demark, PA-C. Schedule an appointment as soon as possible for a visit in 1 week(s).   Specialty: Physician Assistant Contact information: [place] [address] [phone]        Elam Dutch, MD. Schedule an appointment as soon as possible for a visit in 1 week(s).   Specialties: Vascular Surgery, Cardiology Contact information: [address] [place] [address] [phone]          Allergies  Allergen Reactions  . Ativan [Lorazepam] Anxiety    HALLUCINATIONS  . Tylenol With Codeine #3 [Acetaminophen-Codeine] Hives and Itching    Consultations:  Vascular surgery   Procedures/Studies: Dg Chest 2 View  Result Date: 06/07/2019 CLINICAL DATA:  Shortness of breath EXAM: CHEST - 2 VIEW COMPARISON:  June 29, 2015. FINDINGS: Lungs are clear. Heart size and pulmonary vascularity are normal. No adenopathy. No bone lesions. IMPRESSION: No edema or consolidation. Electronically Signed   By: Lowella Grip III M.D.   On: 06/07/2019 16:12   Ct Angio Chest Pe W And/or Wo Contrast  Result Date: 06/07/2019 CLINICAL DATA:  Known clots in the leg, short of breath EXAM: CT ANGIOGRAPHY CHEST WITH CONTRAST TECHNIQUE: Multidetector CT imaging of the chest was performed using the standard protocol during bolus administration of intravenous contrast. Multiplanar CT image reconstructions and MIPs were obtained to evaluate the vascular anatomy. CONTRAST:  180mL OMNIPAQUE IOHEXOL 350 MG/ML SOLN COMPARISON:  Chest x-ray 06/07/2019 FINDINGS: Cardiovascular: Satisfactory opacification of the  pulmonary arteries to the segmental level. Acute thrombus within right inter lobar pulmonary artery, with extension of thrombus into right lower lobe segmental and subsegmental vessels. RV LV ratio is non elevated. Nonaneurysmal aorta. No dissection seen. Borderline to mild cardiomegaly. No pericardial effusion. Mediastinum/Nodes: No enlarged mediastinal, hilar, or axillary lymph nodes. Thyroid gland, trachea, and esophagus demonstrate no significant findings. Lungs/Pleura: Lungs are clear. No pleural effusion or pneumothorax. Upper Abdomen: No acute abnormality. Musculoskeletal: No chest wall abnormality. No acute or significant osseous findings. Review of the MIP images confirms the above findings. IMPRESSION: Positive for acute pulmonary embolus involving right descending pulmonary artery and right lower lobe segmental and subsegmental vessels. Negative for right heart strain with non elevated RV LV ratio. Clear lung fields \ Critical Value/emergent results were called by telephone at the time of interpretation on 06/07/2019 at 8:41 pm to providerANKIT NANAVATI , who verbally acknowledged these results. Electronically Signed   By: Madie Reno.D.  On: 06/07/2019 20:41   Vas [place] Ivc/iliac (venous Only)  Result Date: 06/07/2019 IVC/ILIAC STUDY Indications: Left lower extremity DVT Other Factors: Left ankle injury and surgery. Limitations: Air/bowel gas.  Comparison Study: No prior study. Performing Technologist: Maudry Mayhew MHA, RDMS, RVT, RDCS  Examination Guidelines: A complete evaluation includes B-mode imaging, spectral Doppler, color Doppler, and power Doppler as needed of all accessible portions of each vessel. Bilateral testing is considered an integral part of a complete examination. Limited examinations for reoccurring indications may be performed as noted.  IVC/Iliac Findings: +----------+------+--------+--------+    IVC    PatentThrombusComments +----------+------+--------+--------+  IVC Distalpatent                 +----------+------+--------+--------+  +-------------------+---------+-----------+---------+-----------+--------+         CIV        RT-PatentRT-ThrombusLT-PatentLT-ThrombusComments +-------------------+---------+-----------+---------+-----------+--------+ Common Iliac Distal patent                         acute            +-------------------+---------+-----------+---------+-----------+--------+    Summary: IVC/Iliac: There is no evidence of thrombus involving the IVC. There is no evidence of thrombus involving the right common iliac vein. There is evidence of acute thrombus involving the left common iliac vein.  *See table(s) above for measurements and observations.  Electronically signed by Curt Jews MD on 06/07/2019 at 3:49:29 PM.   Final    Xr Ankle Complete Left  Result Date: 06/06/2019 Stable fixation of trimalleolar ankle fracture.  Persistent lucency of the fibula.  There is no complications of the hardware.  Xr Hip Unilat W Or W/o Pelvis 2-3 Views Left  Result Date: 06/06/2019 Stable total hip replacement without complication.  Vas [place] Lower Extremity Venous (dvt)  Result Date: 06/07/2019  Lower Venous Study Indications: Swelling, and s/p left ankle injury and surgery x approximately 2 weeks.  Performing Technologist: Maudry Mayhew MHA, RDMS, RVT, RDCS  Examination Guidelines: A complete evaluation includes B-mode imaging, spectral Doppler, color Doppler, and power Doppler as needed of all accessible portions of each vessel. Bilateral testing is considered an integral part of a complete examination. Limited examinations for reoccurring indications may be performed as noted.  +-----+---------------+---------+-----------+----------+--------------+ RIGHTCompressibilityPhasicitySpontaneityPropertiesThrombus Aging +-----+---------------+---------+-----------+----------+--------------+ CFV  Full           Yes      Yes                                  +-----+---------------+---------+-----------+----------+--------------+  +---------+---------------+---------+-----------+----------+--------------+ LEFT     CompressibilityPhasicitySpontaneityPropertiesThrombus Aging +---------+---------------+---------+-----------+----------+--------------+ CFV      None                    No                   Acute          +---------+---------------+---------+-----------+----------+--------------+ SFJ      None                                         Acute          +---------+---------------+---------+-----------+----------+--------------+ FV Prox  None  Acute          +---------+---------------+---------+-----------+----------+--------------+ FV Mid   None                                         Acute          +---------+---------------+---------+-----------+----------+--------------+ FV Distal                                             Not visualized +---------+---------------+---------+-----------+----------+--------------+ POP      None                    No                   Acute          +---------+---------------+---------+-----------+----------+--------------+ PTV      None                                         Acute          +---------+---------------+---------+-----------+----------+--------------+ PERO     None                                         Acute          +---------+---------------+---------+-----------+----------+--------------+ EIV                              No                   Acute          +---------+---------------+---------+-----------+----------+--------------+ CIV                              No                   Acute          +---------+---------------+---------+-----------+----------+--------------+  Summary: Right: No evidence of common femoral vein obstruction. Left: Findings consistent with acute deep  vein thrombosis involving the left common iliac vein, left external iliac vein, left common femoral vein, left femoral vein, left popliteal vein, left posterior tibial veins, and left peroneal veins. No cystic structure found in the popliteal fossa.  *See table(s) above for measurements and observations. Electronically signed by Curt Jews MD on 06/07/2019 at 3:50:46 PM.    Final       Subjective: Feeling well.  Leg still swollen but improved.  No fever, headache.  No chest pain, dyspnea, pleurisy.  Discharge Exam: Vitals:   06/09/19 1959 06/10/19 0337  BP: 136/81 134/78  Pulse: 72 82  Resp: 18 17  Temp: 97.8 F (36.6 C) 98.2 F (36.8 C)  SpO2: 100% 97%   Vitals:   06/09/19 1524 06/09/19 1529 06/09/19 1959 06/10/19 0337  BP: (!) 163/96 (!) 166/92 136/81 134/78  Pulse: 60 (!) 59 72 82  Resp: 17 11 18 17   Temp:   97.8 F (36.6 C) 98.2 F (36.8 C)  TempSrc:   Oral Oral  SpO2:  100% 97% 100% 97%  Weight:    99.1 kg  Height:        General: Pt is alert, awake, not in acute distress Cardiovascular: RRR, nl S1-S2, no murmurs appreciated.  LLE edema, still present Respiratory: Normal respiratory rate and rhythm.  CTAB without rales or wheezes. Abdominal: Abdomen soft and non-tender.  No distension or HSM.   Neuro/Psych: Strength symmetric in upper and lower extremities.  Judgment and insight appear normal.   The results of significant diagnostics from this hospitalization (including imaging, microbiology, ancillary and laboratory) are listed below for reference.     Microbiology: Recent Results (from the past 240 hour(s))  SARS Coronavirus 2 by RT PCR (hospital order, performed in [facility] hospital lab) Nasopharyngeal Nasopharyngeal Swab     Status: None   Collection Time: 06/07/19  9:06 PM   Specimen: Nasopharyngeal Swab  Result Value Ref Range Status   SARS Coronavirus 2 NEGATIVE NEGATIVE Final    Comment: (NOTE) If result is NEGATIVE SARS-CoV-2 target nucleic acids  are NOT DETECTED. The SARS-CoV-2 RNA is generally detectable in upper and lower  respiratory specimens during the acute phase of infection. The lowest  concentration of SARS-CoV-2 viral copies this assay can detect is 250  copies / mL. A negative result does not preclude SARS-CoV-2 infection  and should not be used as the sole basis for treatment or other  patient management decisions.  A negative result may occur with  improper specimen collection / handling, submission of specimen other  than nasopharyngeal swab, presence of viral mutation(s) within the  areas targeted by this assay, and inadequate number of viral copies  (<250 copies / mL). A negative result must be combined with clinical  observations, patient history, and epidemiological information. If result is POSITIVE SARS-CoV-2 target nucleic acids are DETECTED. The SARS-CoV-2 RNA is generally detectable in upper and lower  respiratory specimens dur ing the acute phase of infection.  Positive  results are indicative of active infection with SARS-CoV-2.  Clinical  correlation with patient history and other diagnostic information is  necessary to determine patient infection status.  Positive results do  not rule out bacterial infection or co-infection with other viruses. If result is PRESUMPTIVE POSTIVE SARS-CoV-2 nucleic acids MAY BE PRESENT.   A presumptive positive result was obtained on the submitted specimen  and confirmed on repeat testing.  While 2019 novel coronavirus  (SARS-CoV-2) nucleic acids may be present in the submitted sample  additional confirmatory testing may be necessary for epidemiological  and / or clinical management purposes  to differentiate between  SARS-CoV-2 and other Sarbecovirus currently known to infect humans.  If clinically indicated additional testing with an alternate test  methodology [phone]) is advised. The SARS-CoV-2 RNA is generally  detectable in upper and lower respiratory sp ecimens  during the acute  phase of infection. The expected result is Negative. Fact Sheet for Patients:  StrictlyIdeas.no Fact Sheet for Healthcare Providers: BankingDealers.co.za This test is not yet approved or cleared by the [place] FDA and has been authorized for detection and/or diagnosis of SARS-CoV-2 by FDA under an Emergency Use Authorization (EUA).  This EUA will remain in effect (meaning this test can be used) for the duration of the COVID-19 declaration under Section 564(b)(1) of the Act, 21 U.S.C. section 360bbb-3(b)(1), unless the authorization is terminated or revoked sooner. Performed at [facility], [place] [address], [place], [place] [address]      Labs: BNP (last 3 results) No results  for input(s): BNP in the last 8760 hours. Basic Metabolic Panel: Recent Labs  Lab 06/07/19 1531 06/08/19 0352 06/08/19 0740 06/09/19 0412 06/10/19 0031  NA 140 140  --  136 137  K 3.5 2.8*  --  3.4* 3.3*  CL 104 106  --  104 106  CO2 26 26  --  22 22  GLUCOSE 93 91  --  81 97  BUN 15 12  --  10 7  CREATININE 1.03* 0.86  --  0.87 0.84  CALCIUM 8.9 8.6*  --  8.8* 8.8*  MG  --   --  1.9  --   --    Liver Function Tests: No results for input(s): AST, ALT, ALKPHOS, BILITOT, PROT, ALBUMIN in the last 168 hours. No results for input(s): LIPASE, AMYLASE in the last 168 hours. No results for input(s): AMMONIA in the last 168 hours. CBC: Recent Labs  Lab 06/07/19 1531 06/08/19 0352 06/09/19 0412 06/10/19 0031  WBC 9.1 8.6 7.3 9.0  HGB 11.0* 10.4* 10.6* 10.1*  HCT 32.7* 31.3* 30.9* 30.1*  MCV 89.1 87.4 87.0 86.7  PLT 320 312 296 276   Cardiac Enzymes: No results for input(s): CKTOTAL, CKMB, CKMBINDEX, TROPONINI in the last 168 hours. BNP: Invalid input(s): POCBNP CBG: No results for input(s): GLUCAP in the last 168 hours. D-Dimer No results for input(s): DDIMER in the last 72 hours. Hgb A1c No results for input(s):  HGBA1C in the last 72 hours. Lipid Profile No results for input(s): CHOL, HDL, LDLCALC, TRIG, CHOLHDL, LDLDIRECT in the last 72 hours. Thyroid function studies No results for input(s): TSH, T4TOTAL, T3FREE, THYROIDAB in the last 72 hours.  Invalid input(s): FREET3 Anemia work up No results for input(s): VITAMINB12, FOLATE, FERRITIN, TIBC, IRON, RETICCTPCT in the last 72 hours. Urinalysis    Component Value Date/Time   COLORURINE YELLOW 03/23/2017 [place] 03/23/2017 1235   LABSPEC 1.019 03/23/2017 1235   PHURINE 5.0 03/23/2017 1235   GLUCOSEU NEGATIVE 03/23/2017 1235   HGBUR NEGATIVE 03/23/2017 1235   HGBUR negative 05/13/2010 1036   BILIRUBINUR NEGATIVE 03/23/2017 1235   KETONESUR NEGATIVE 03/23/2017 1235   PROTEINUR NEGATIVE 03/23/2017 1235   UROBILINOGEN 0.2 04/21/2013 0039   NITRITE NEGATIVE 03/23/2017 1235   LEUKOCYTESUR NEGATIVE 03/23/2017 1235   Sepsis Labs Invalid input(s): PROCALCITONIN,  WBC,  LACTICIDVEN Microbiology Recent Results (from the past 240 hour(s))  SARS Coronavirus 2 by RT PCR (hospital order, performed in [place] hospital lab) Nasopharyngeal Nasopharyngeal Swab     Status: None   Collection Time: 06/07/19  9:06 PM   Specimen: Nasopharyngeal Swab  Result Value Ref Range Status   SARS Coronavirus 2 NEGATIVE NEGATIVE Final    Comment: (NOTE) If result is NEGATIVE SARS-CoV-2 target nucleic acids are NOT DETECTED. The SARS-CoV-2 RNA is generally detectable in upper and lower  respiratory specimens during the acute phase of infection. The lowest  concentration of SARS-CoV-2 viral copies this assay can detect is 250  copies / mL. A negative result does not preclude SARS-CoV-2 infection  and should not be used as the sole basis for treatment or other  patient management decisions.  A negative result may occur with  improper specimen collection / handling, submission of specimen other  than nasopharyngeal swab, presence of viral  mutation(s) within the  areas targeted by this assay, and inadequate number of viral copies  (<250 copies / mL). A negative result must be combined with clinical  observations, patient history, and epidemiological  information. If result is POSITIVE SARS-CoV-2 target nucleic acids are DETECTED. The SARS-CoV-2 RNA is generally detectable in upper and lower  respiratory specimens dur ing the acute phase of infection.  Positive  results are indicative of active infection with SARS-CoV-2.  Clinical  correlation with patient history and other diagnostic information is  necessary to determine patient infection status.  Positive results do  not rule out bacterial infection or co-infection with other viruses. If result is PRESUMPTIVE POSTIVE SARS-CoV-2 nucleic acids MAY BE PRESENT.   A presumptive positive result was obtained on the submitted specimen  and confirmed on repeat testing.  While 2019 novel coronavirus  (SARS-CoV-2) nucleic acids may be present in the submitted sample  additional confirmatory testing may be necessary for epidemiological  and / or clinical management purposes  to differentiate between  SARS-CoV-2 and other Sarbecovirus currently known to infect humans.  If clinically indicated additional testing with an alternate test  methodology [phone]) is advised. The SARS-CoV-2 RNA is generally  detectable in upper and lower respiratory sp ecimens during the acute  phase of infection. The expected result is Negative. Fact Sheet for Patients:  StrictlyIdeas.no Fact Sheet for Healthcare Providers: BankingDealers.co.za This test is not yet approved or cleared by the [place] FDA and has been authorized for detection and/or diagnosis of SARS-CoV-2 by FDA under an Emergency Use Authorization (EUA).  This EUA will remain in effect (meaning this test can be used) for the duration of the COVID-19 declaration under Section 564(b)(1)  of the Act, 21 U.S.C. section 360bbb-3(b)(1), unless the authorization is terminated or revoked sooner. Performed at [facility], [place] [address], [place], [place] [address]      Time coordinating discharge: 40 minutes     SIGNED:   Edwin Dada, MD  [facility] 06/10/2019, 3:36 PM

## 2019-06-10 NOTE — Progress Notes (Addendum)
[facility]  Subjective  - States her left leg feels better.   Objective 134/78 82 98.2 F (36.8 C) (Oral) 17 97%  Intake/Output Summary (Last 24 hours) at 06/10/2019 0852 Last data filed at 06/10/2019 0600 Gross per 24 hour  Intake 601.65 ml  Output -  Net 601.65 ml    Left leg still swollen Left popliteal access no hematoma  Laboratory Lab Results: Recent Labs    06/09/19 0412 06/10/19 0031  WBC 7.3 9.0  HGB 10.6* 10.1*  HCT 30.9* 30.1*  PLT 296 276   BMET Recent Labs    06/09/19 0412 06/10/19 0031  NA 136 137  K 3.4* 3.3*  CL 104 106  CO2 22 22  GLUCOSE 81 97  BUN 10 7  CREATININE 0.87 0.84  CALCIUM 8.8* 8.8*    COAG Lab Results  Component Value Date   INR 0.94 03/23/2017   INR 0.98 06/26/2010   No results found for: PTT  Assessment/Planning: POD #1 s/p left lower extremity mechanical venous thrombectomy and stent for May Thurner.  Left leg feels better.  Hgb 10.6 --> 10.1.  [place] for discharge from our standpoint.  Now on Xarelto and will need full anticoagulation at discharge.  F/U arrange with vascular surgery in 3-4 weeks.  Marty Heck 06/10/2019 8:52 AM --

## 2019-06-10 NOTE — Progress Notes (Signed)
[place] for Heparin Indication: pulmonary embolus  Allergies  Allergen Reactions  . Ativan [Lorazepam] Anxiety    HALLUCINATIONS  . Tylenol With Codeine #3 [Acetaminophen-Codeine] Hives and Itching    Patient Measurements: Height: 5\' 1"  (154.9 cm) Weight: 220 lb 12.8 oz (100.2 kg) IBW/kg (Calculated) : 47.8 Heparin Dosing Weight: 68 kg  Vital Signs: Temp: 97.8 F (36.6 C) (10/23 1959) Temp Source: Oral (10/23 1959) BP: 136/81 (10/23 1959) Pulse Rate: 72 (10/23 1959)  Labs: Recent Labs    06/08/19 0352 06/08/19 0740 06/08/19 1039 06/08/19 1925 06/09/19 0412 06/10/19 0031  HGB 10.4*  --   --   --  10.6* 10.1*  HCT 31.3*  --   --   --  30.9* 30.1*  PLT 312  --   --   --  296 276  HEPARINUNFRC 0.90*  --  0.97* 0.79* 0.43 0.78*  CREATININE 0.86  --   --   --  0.87 0.84  TROPONINIHS  --  5 3  --   --   --     Estimated Creatinine Clearance: 89 mL/min (by C-G formula based on SCr of 0.84 mg/dL).  Assessment: 48 y.o. female with DVT/PE for heparin. No anticoagulants PTA.  Heparin level was therapeutic at 0.43 units/ml earlier today, on 1150 units/hr.  Hgb 10.6, plt 296. No s/sx of bleeding. No infusion issues.  Pt is S/P superior venacavogram, lower extremity venogram, IVUS inferior vena cava, and external femoral popliteal vein, left common iliac venous stent (15 x 9 V ICI), mechanical thrombectomy this afternoon. Pharmacy is consulted to restart heparin infusion 2 hrs after sheath removal. Per procedure log, sheath was removed at 15:27 this afternoon (10/23). No issues with IV or bleeding, per RN.  10/24 AM update: Heparin level elevated after re-start Likely some intra-operative heparin bolus effect No issues per RN  Goal of Therapy:  Heparin level 0.3-0.7 units/ml Monitor platelets by anticoagulation protocol: Yes   Plan:  -Dec heparin to 1050 units/hr -Re-check heparin level in 8 hours  Narda Bonds, PharmD,  [facility] Pharmacist Phone: [phone]

## 2019-06-12 ENCOUNTER — Encounter (HOSPITAL_COMMUNITY): Payer: Self-pay | Admitting: Vascular Surgery

## 2019-06-13 ENCOUNTER — Ambulatory Visit: Payer: Medicare Other | Admitting: Orthopaedic Surgery

## 2019-06-15 ENCOUNTER — Encounter: Payer: Self-pay | Admitting: Orthopaedic Surgery

## 2019-06-15 ENCOUNTER — Ambulatory Visit (INDEPENDENT_AMBULATORY_CARE_PROVIDER_SITE_OTHER): Payer: Medicare Other | Admitting: Orthopaedic Surgery

## 2019-06-15 ENCOUNTER — Ambulatory Visit (INDEPENDENT_AMBULATORY_CARE_PROVIDER_SITE_OTHER): Payer: Medicare Other

## 2019-06-15 ENCOUNTER — Other Ambulatory Visit: Payer: Self-pay

## 2019-06-15 DIAGNOSIS — S82852G Displaced trimalleolar fracture of left lower leg, subsequent encounter for closed fracture with delayed healing: Secondary | ICD-10-CM

## 2019-06-15 MED ORDER — HYDROCODONE-ACETAMINOPHEN 5-325 MG PO TABS: 1.0000 | ORAL_TABLET | Freq: Every day | ORAL | 0 refills | Status: DC | PRN

## 2019-06-15 NOTE — Progress Notes (Signed)
Office Visit Note   Patient: Patricia Davila           Date of Birth: 06-Feb-1971           MRN: [id number] Visit Date: 06/15/2019              Requested by: Clent Demark, PA-C No address on file PCP: Clent Demark, PA-C   Assessment & Plan: Visit Diagnoses:  1. Displaced trimalleolar fracture of left lower leg, subsequent encounter for closed fracture with delayed healing     Plan: Impression is 3 months status post ORIF left trimalleolar ankle fracture.  She has a persistent lucency of her fibula fracture therefore we will order a bone stimulator for her.  I have refilled her hydrocodone since she can no longer take NSAIDs for pain.  We will provide her with an ASO brace for support.  We will also resume physical therapy for ankle rehab.  I would like to recheck her in 6 weeks with three-view x-rays of the left ankle.  Follow-Up Instructions: Return in about 6 weeks (around 07/27/2019).   Orders:  Orders Placed This Encounter  Procedures  . XR Ankle Complete Left  . Ambulatory referral to Physical Therapy   Meds ordered this encounter  Medications  . HYDROcodone-acetaminophen (NORCO) 5-325 MG tablet    Sig: Take 1 tablet by mouth daily as needed.    Dispense:  10 tablet    Refill:  0      Procedures: No procedures performed   Clinical Data: No additional findings.   Subjective: Chief Complaint  Patient presents with  . Left Foot - Pain    Patricia Davila is 90 days status post ORIF left trimalleolar ankle fracture.  She returns today for her 21-month visit.  After our last encounter she was diagnosed with a large DVT and multiple PEs for which she underwent surgical intervention and was subsequently placed on Xarelto.  She follows up today for left ankle and foot pain and swelling   Review of Systems   Objective: Vital Signs: LMP 05/20/2019   Physical Exam  Ortho Exam Left foot and ankle exam shows moderate swelling.  Neurovascular intact.  Surgical scars  are stable.  She does have stable dry scabs on the lateral side. Specialty Comments:  No specialty comments available.  Imaging: No results found.   PMFS History: Patient Active Problem List   Diagnosis Date Noted  . Pulmonary embolism ([place]) 06/07/2019  . 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 Displaced trimalleolar fracture of left lower leg, subsequent encounter for closed fracture with delayed healing 03/17/2019  . Primary osteoarthritis of right hip 04/08/2017  . History of hip replacement 03/25/2017  . Primary osteoarthritis of left hip 12/22/2016  . Major depressive disorder 10/24/2012  . Smokes tobacco daily 05/13/2010  . Headache(784.0) 05/13/2010  . Microalbuminuria 04/16/2009  . GERD 11/22/2008  . Left hip pain 11/22/2008  . Bipolar 1 disorder, depressed ([place]) 05/17/2007  . Lumbosacral degenerative disk disease 03/23/2005  . Essential hypertension, benign 08/18/2003   Past Medical History:  Diagnosis Date  . Anemia    low iron during pregnancy  . Anxiety   . Arthritis   . Asthma    as a child  . Bipolar disorder ([place])   . Depression   . GERD (gastroesophageal reflux disease)   . Headache   . Hypertension   . Left trimalleolar fracture   . Pneumonia    as a child  . PONV (  postoperative nausea and vomiting)   . Restless legs     Family History  Problem Relation Age of Onset  . Colon cancer Mother   . HIV Mother   . Colon cancer Sister   . Breast cancer Neg Hx     Past Surgical History:  Procedure Laterality Date  . BILATERAL ANTERIOR TOTAL HIP ARTHROPLASTY Bilateral 03/25/2017   Procedure: BILATERAL ANTERIOR TOTAL HIP ARTHROPLASTY;  Surgeon: Leandrew Koyanagi, MD;  Location: [facility];  Service: Orthopedics;  Laterality: Bilateral;  . DENTAL SURGERY     all teeth extracted  . INTRAVASCULAR ULTRASOUND/IVUS N/A 06/09/2019   Procedure: INTRAVASCULAR ULTRASOUND/IVUS;  Surgeon: Elam Dutch, MD;  Location: [facility];  Service: Cardiovascular;  Laterality:  N/A;  . LAMINECTOMY AND MICRODISCECTOMY LUMBAR SPINE   06/27/2010  . ORIF ANKLE FRACTURE Left 03/17/2019   Procedure: OPEN REDUCTION INTERNAL FIXATION (ORIF) LEFT ANKLE FRACTURE;  Surgeon: Leandrew Koyanagi, MD;  Location: [facility];  Service: Orthopedics;  Laterality: Left;  . PERIPHERAL VASCULAR INTERVENTION Left 06/09/2019   Procedure: PERIPHERAL VASCULAR INTERVENTION;  Surgeon: Elam Dutch, MD;  Location: [facility];  Service: Cardiovascular;  Laterality: Left;  Common Iliac vein  . PERIPHERAL VASCULAR THROMBECTOMY Left 06/09/2019   Procedure: PERIPHERAL VASCULAR THROMBECTOMY;  Surgeon: Elam Dutch, MD;  Location: [facility];  Service: Cardiovascular;  Laterality: Left;  . TUBAL LIGATION Bilateral    Social History   Occupational History  . Not on file  Tobacco Use  . Smoking status: Current Every Day Smoker    Packs/day: 0.25    Types: Cigarettes  . Smokeless tobacco: Never Used  Substance and Sexual Activity  . Alcohol use: No  . Drug use: No  . Sexual activity: Yes    Birth control/protection: Surgical    Comment: BTL

## 2019-06-20 ENCOUNTER — Telehealth: Payer: Self-pay | Admitting: Orthopaedic Surgery

## 2019-06-20 NOTE — Telephone Encounter (Signed)
Returned call to patient left message to call back concerning her foot

## 2019-06-23 DIAGNOSIS — S82852K Displaced trimalleolar fracture of left lower leg, subsequent encounter for closed fracture with nonunion: Secondary | ICD-10-CM | POA: Diagnosis not present

## 2019-06-28 ENCOUNTER — Other Ambulatory Visit (INDEPENDENT_AMBULATORY_CARE_PROVIDER_SITE_OTHER): Payer: Self-pay | Admitting: Primary Care

## 2019-06-28 DIAGNOSIS — Z760 Encounter for issue of repeat prescription: Secondary | ICD-10-CM

## 2019-06-28 MED ORDER — OMEPRAZOLE 40 MG PO CPDR: 40.0000 mg | DELAYED_RELEASE_CAPSULE | Freq: Every day | ORAL | 1 refills | Status: DC

## 2019-06-29 DIAGNOSIS — M25672 Stiffness of left ankle, not elsewhere classified: Secondary | ICD-10-CM | POA: Diagnosis not present

## 2019-06-29 DIAGNOSIS — M25572 Pain in left ankle and joints of left foot: Secondary | ICD-10-CM | POA: Diagnosis not present

## 2019-06-29 DIAGNOSIS — M25472 Effusion, left ankle: Secondary | ICD-10-CM | POA: Diagnosis not present

## 2019-06-29 DIAGNOSIS — R269 Unspecified abnormalities of gait and mobility: Secondary | ICD-10-CM | POA: Diagnosis not present

## 2019-06-29 DIAGNOSIS — M62572 Muscle wasting and atrophy, not elsewhere classified, left ankle and foot: Secondary | ICD-10-CM | POA: Diagnosis not present

## 2019-07-03 ENCOUNTER — Other Ambulatory Visit: Payer: Self-pay

## 2019-07-03 DIAGNOSIS — I82402 Acute embolism and thrombosis of unspecified deep veins of left lower extremity: Secondary | ICD-10-CM

## 2019-07-05 ENCOUNTER — Telehealth (HOSPITAL_COMMUNITY): Payer: Self-pay | Admitting: *Deleted

## 2019-07-05 ENCOUNTER — Encounter (INDEPENDENT_AMBULATORY_CARE_PROVIDER_SITE_OTHER): Payer: Self-pay | Admitting: Primary Care

## 2019-07-05 ENCOUNTER — Other Ambulatory Visit: Payer: Self-pay

## 2019-07-05 ENCOUNTER — Ambulatory Visit (INDEPENDENT_AMBULATORY_CARE_PROVIDER_SITE_OTHER): Payer: Medicare Other | Admitting: Primary Care

## 2019-07-05 VITALS — BP 116/80 | HR 80 | Temp 98.2°F | Resp 16 | Wt 213.0 lb

## 2019-07-05 DIAGNOSIS — R0602 Shortness of breath: Secondary | ICD-10-CM | POA: Diagnosis not present

## 2019-07-05 DIAGNOSIS — F17210 Nicotine dependence, cigarettes, uncomplicated: Secondary | ICD-10-CM | POA: Diagnosis not present

## 2019-07-05 DIAGNOSIS — Z09 Encounter for follow-up examination after completed treatment for conditions other than malignant neoplasm: Secondary | ICD-10-CM

## 2019-07-05 DIAGNOSIS — I82492 Acute embolism and thrombosis of other specified deep vein of left lower extremity: Secondary | ICD-10-CM

## 2019-07-05 DIAGNOSIS — I10 Essential (primary) hypertension: Secondary | ICD-10-CM

## 2019-07-05 DIAGNOSIS — F17200 Nicotine dependence, unspecified, uncomplicated: Secondary | ICD-10-CM

## 2019-07-05 NOTE — Patient Instructions (Signed)
Deep Vein Thrombosis  Deep vein thrombosis (DVT) is a condition in which a blood clot forms in a deep vein, such as a lower leg, thigh, or arm vein. A clot is blood that has thickened into a gel or solid. This condition is dangerous. It can lead to serious and even life-threatening complications if the clot travels to the lungs and causes a blockage (pulmonary embolism). It can also damage veins in the leg. This can result in leg pain, swelling, discoloration, and sores (post-thrombotic syndrome). What are the causes? This condition may be caused by:  A slowdown of blood flow.  Damage to a vein.  A condition that causes blood to clot more easily, such as an inherited clotting disorder. What increases the risk? The following factors may make you more likely to develop this condition:  Being overweight.  Being older, especially over age 59.  Sitting or lying down for more than four hours.  Being in the hospital.  Lack of physical activity (sedentary lifestyle).  Pregnancy, being in childbirth, or having recently given birth.  Taking medicines that contain estrogen, such as medicines to prevent pregnancy.  Smoking.  A history of any of the following: ? Blood clots or a blood clotting disease. ? Peripheral vascular disease. ? Inflammatory bowel disease. ? Cancer. ? Heart disease. ? Genetic conditions that affect how your blood clots, such as Factor V Leiden mutation. ? Neurological diseases that affect your legs (leg paresis). ? A recent injury, such as a car accident. ? Major or lengthy surgery. ? A central line placed inside a large vein. What are the signs or symptoms? Symptoms of this condition include:  Swelling, pain, or tenderness in an arm or leg.  Warmth, redness, or discoloration in an arm or leg. If the clot is in your leg, symptoms may be more noticeable or worse when you stand or walk. Some people may not develop any symptoms. How is this diagnosed? This  condition is diagnosed with:  A medical history and physical exam.  Tests, such as: ? Blood tests. These are done to check how well your blood clots. ? Ultrasound. This is done to check for clots. ? Venogram. For this test, contrast dye is injected into a vein and X-rays are taken to check for any clots. How is this treated? Treatment for this condition depends on:  The cause of your DVT.  Your risk for bleeding or developing more clots.  Any other medical conditions that you have. Treatment may include:  Taking a blood thinner (anticoagulant). This type of medicine prevents clots from forming. It may be taken by mouth, injected under the skin, or injected through an IV (catheter).  Injecting clot-dissolving medicines into the affected vein (catheter-directed thrombolysis).  Having surgery. Surgery may be done to: ? Remove the clot. ? Place a filter in a large vein to catch blood clots before they reach the lungs. Some treatments may be continued for up to six months. Follow these instructions at home: If you are taking blood thinners:  Take the medicine exactly as told by your health care provider. Some blood thinners need to be taken at the same time every day. Do not skip a dose.  Talk with your health care provider before you take any medicines that contain aspirin or NSAIDs. These medicines increase your risk for dangerous bleeding.  Ask your health care provider about foods and drugs that could change the way the medicine works (may interact). Avoid those things if your  health care provider tells you to do so.  Blood thinners can cause easy bruising and may make it difficult to stop bleeding. Because of this: ? Be very careful when using knives, scissors, or other sharp objects. ? Use an electric razor instead of a blade. ? Avoid activities that could cause injury or bruising, and follow instructions about how to prevent falls.  Wear a medical alert bracelet or carry a  card that lists what medicines you take. General instructions  Take over-the-counter and prescription medicines only as told by your health care provider.  Return to your normal activities as told by your health care provider. Ask your health care provider what activities are safe for you.  Wear compression stockings if recommended by your health care provider.  Keep all follow-up visits as told by your health care provider. This is important. How is this prevented? To lower your risk of developing this condition again:  For 30 or more minutes every day, do an activity that: ? Involves moving your arms and legs. ? Increases your heart rate.  When traveling for longer than four hours: ? Exercise your arms and legs every hour. ? Drink plenty of water. ? Avoid drinking alcohol.  Avoid sitting or lying for a long time without moving your legs.  If you have surgery or you are hospitalized, ask about ways to prevent blood clots. These may include taking frequent walks or using anticoagulants.  Stay at a healthy weight.  If you are a woman who is older than age 8, avoid unnecessary use of medicines that contain estrogen, such as some birth control pills.  Do not use any products that contain nicotine or tobacco, such as cigarettes and e-cigarettes. This is especially important if you take estrogen medicines. If you need help quitting, ask your health care provider. Contact a health care provider if:  You miss a dose of your blood thinner.  Your menstrual period is heavier than usual.  You have unusual bruising. Get help right away if:  You have: ? New or increased pain, swelling, or redness in an arm or leg. ? Numbness or tingling in an arm or leg. ? Shortness of breath. ? Chest pain. ? A rapid or irregular heartbeat. ? A severe headache or confusion. ? A cut that will not stop bleeding.  There is blood in your vomit, stool, or urine.  You have a serious fall or accident,  or you hit your head.  You feel light-headed or dizzy.  You cough up blood. These symptoms may represent a serious problem that is an emergency. Do not wait to see if the symptoms will go away. Get medical help right away. Call your local emergency services (911 in the [place] Do not drive yourself to the hospital. Summary  Deep vein thrombosis (DVT) is a condition in which a blood clot forms in a deep vein, such as a lower leg, thigh, or arm vein.  Symptoms can include swelling, warmth, pain, and redness in your leg or arm.  This condition may be treated with a blood thinner (anticoagulant medicine), medicine that is injected to dissolve blood clots,compression stockings, or surgery.  If you are prescribed blood thinners, take them exactly as told. This information is not intended to replace advice given to you by your health care provider. Make sure you discuss any questions you have with your health care provider. Document Released: 08/03/2005 Document Revised: 07/16/2017 Document Reviewed: 01/01/2017 Elsevier Patient Education  [employer]. Pulmonary  Embolism  A pulmonary embolism (PE) is a sudden blockage or decrease of blood flow in one or both lungs. Most blockages come from a blood clot that forms in the vein of a lower leg, thigh, or arm (deep vein thrombosis, DVT) and travels to the lungs. A clot is blood that has thickened into a gel or solid. PE is a dangerous and life-threatening condition that needs to be treated right away. What are the causes? This condition is usually caused by a blood clot that forms in a vein and moves to the lungs. In rare cases, it may be caused by air, fat, part of a tumor, or other tissue that moves through the veins and into the lungs. What increases the risk? The following factors may make you more likely to develop this condition:  Experiencing a traumatic injury, such as breaking a hip or leg.  Having: ? A spinal cord  injury. ? Orthopedic surgery, especially hip or knee replacement. ? Any major surgery. ? A stroke. ? DVT. ? Blood clots or blood clotting disease. ? Long-term (chronic) lung or heart disease. ? Cancer treated with chemotherapy. ? A central venous catheter.  Taking medicines that contain estrogen. These include birth control pills and hormone replacement therapy.  Being: ? Pregnant. ? In the period of time after your baby is delivered (postpartum). ? Older than age 89. ? Overweight. ? A smoker, especially if you have other risks. What are the signs or symptoms? Symptoms of this condition usually start suddenly and include:  Shortness of breath during activity or at rest.  Coughing, coughing up blood, or coughing up blood-tinged mucus.  Chest pain that is often worse with deep breaths.  Rapid or irregular heartbeat.  Feeling light-headed or dizzy.  Fainting.  Feeling anxious.  Fever.  Sweating.  Pain and swelling in a leg. This is a symptom of DVT, which can lead to PE. How is this diagnosed? This condition may be diagnosed based on:  Your medical history.  A physical exam.  Blood tests.  CT pulmonary angiogram. This test checks blood flow in and around your lungs.  Ventilation-perfusion scan, also called a lung VQ scan. This test measures air flow and blood flow to the lungs.  An ultrasound of the legs. How is this treated? Treatment for this condition depends on many factors, such as the cause of your PE, your risk for bleeding or developing more clots, and other medical conditions you have. Treatment aims to remove, dissolve, or stop blood clots from forming or growing larger. Treatment may include:  Medicines, such as: ? Blood thinning medicines (anticoagulants) to stop clots from forming. ? Medicines that dissolve clots (thrombolytics).  Procedures, such as: ? Using a flexible tube to remove a blood clot (embolectomy) or to deliver medicine to destroy  it (catheter-directed thrombolysis). ? Inserting a filter into a large vein that carries blood to the heart (inferior vena cava). This filter (vena cava filter) catches blood clots before they reach the lungs. ? Surgery to remove the clot (surgical embolectomy). This is rare. You may need a combination of immediate, long-term (up to 3 months after diagnosis), and extended (more than 3 months after diagnosis) treatments. Your treatment may continue for several months (maintenance therapy). You and your health care provider will work together to choose the treatment program that is best for you. Follow these instructions at home: Medicines  Take over-the-counter and prescription medicines only as told by your health care provider.  If you  are taking an anticoagulant medicine: ? Take the medicine every day at the same time each day. ? Understand what foods and drugs interact with your medicine. ? Understand the side effects of this medicine, including excessive bruising or bleeding. Ask your health care provider or pharmacist about other side effects. General instructions  Wear a medical alert bracelet or carry a medical alert card that says you have had a PE and lists what medicines you take.  Ask your health care provider when you may return to your normal activities. Avoid sitting or lying for a long time without moving.  Maintain a healthy weight. Ask your health care provider what weight is healthy for you.  Do not use any products that contain nicotine or tobacco, such as cigarettes, e-cigarettes, and chewing tobacco. If you need help quitting, ask your health care provider.  Talk with your health care provider about any travel plans. It is important to make sure that you are still able to take your medicine while on trips.  Keep all follow-up visits as told by your health care provider. This is important. Contact a health care provider if:  You missed a dose of your blood thinner  medicine. Get help right away if:  You have: ? New or increased pain, swelling, warmth, or redness in an arm or leg. ? Numbness or tingling in an arm or leg. ? Shortness of breath during activity or at rest. ? A fever. ? Chest pain. ? A rapid or irregular heartbeat. ? A severe headache. ? Vision changes. ? A serious fall or accident, or you hit your head. ? Stomach (abdominal) pain. ? Blood in your vomit, stool, or urine. ? A cut that will not stop bleeding.  You cough up blood.  You feel light-headed or dizzy.  You cannot move your arms or legs.  You are confused or have memory loss. These symptoms may represent a serious problem that is an emergency. Do not wait to see if the symptoms will go away. Get medical help right away. Call your local emergency services (911 in the [place] Do not drive yourself to the hospital. Summary  A pulmonary embolism (PE) is a sudden blockage or decrease of blood flow in one or both lungs. PE is a dangerous and life-threatening condition that needs to be treated right away.  Treatments for this condition usually include medicines to thin your blood (anticoagulants) or medicines to break apart blood clots (thrombolytics).  If you are given blood thinners, it is important to take the medicine every day at the same time each day.  Understand what foods and drugs interact with any medicines that you are taking.  If you have signs of PE or DVT, call your local emergency services (911 in the [place] This information is not intended to replace advice given to you by your health care provider. Make sure you discuss any questions you have with your health care provider. Document Released: 07/31/2000 Document Revised: 05/11/2018 Document Reviewed: 05/11/2018 Elsevier Patient Education  2020 [employer].

## 2019-07-05 NOTE — Telephone Encounter (Signed)

## 2019-07-05 NOTE — Progress Notes (Signed)
Established Patient Office Visit  Subjective:  Patient ID: Patricia Davila, female    DOB: 22-Jul-1971  Age: 48 y.o. MRN: [id number]  CC:  Chief Complaint  Patient presents with  . Hospitalization Follow-up    HPI [name] presents for [name] is a 48 y.o. female with history of hypertension who has had recent left ankle surgery with ORIF in July 2020 was experiencing increasing swelling of the left lower extremity over the past 1 week and patient's orthopedic surgeon ordered Dopplers which showed DVT involving the left iliac to all the way down veins and was instructed to come to the ER.  Patient also stated that she has been having shortness of breath off and on for the last 1 week but denies any chest pain productive cough fever or chills.  Patient states she has not been ambulatory after the surgery much.   Past Medical History:  Diagnosis Date  . Anemia    low iron during pregnancy  . Anxiety   . Arthritis   . Asthma    as a child  . Bipolar disorder ([place])   . Depression   . GERD (gastroesophageal reflux disease)   . Headache   . Hypertension   . Left trimalleolar fracture   . Pneumonia    as a child  . PONV (postoperative nausea and vomiting)   . Restless legs     Past Surgical History:  Procedure Laterality Date  . BILATERAL ANTERIOR TOTAL HIP ARTHROPLASTY Bilateral 03/25/2017   Procedure: BILATERAL ANTERIOR TOTAL HIP ARTHROPLASTY;  Surgeon: Leandrew Koyanagi, MD;  Location: [facility];  Service: Orthopedics;  Laterality: Bilateral;  . DENTAL SURGERY     all teeth extracted  . INTRAVASCULAR ULTRASOUND/IVUS N/A 06/09/2019   Procedure: INTRAVASCULAR ULTRASOUND/IVUS;  Surgeon: Elam Dutch, MD;  Location: [facility];  Service: Cardiovascular;  Laterality: N/A;  . LAMINECTOMY AND MICRODISCECTOMY LUMBAR SPINE   06/27/2010  . ORIF ANKLE FRACTURE Left 03/17/2019   Procedure: OPEN REDUCTION INTERNAL FIXATION (ORIF) LEFT ANKLE FRACTURE;  Surgeon: Leandrew Koyanagi, MD;   Location: [place];  Service: Orthopedics;  Laterality: Left;  . PERIPHERAL VASCULAR INTERVENTION Left 06/09/2019   Procedure: PERIPHERAL VASCULAR INTERVENTION;  Surgeon: Elam Dutch, MD;  Location: [facility];  Service: Cardiovascular;  Laterality: Left;  Common Iliac vein  . PERIPHERAL VASCULAR THROMBECTOMY Left 06/09/2019   Procedure: PERIPHERAL VASCULAR THROMBECTOMY;  Surgeon: Elam Dutch, MD;  Location: [facility];  Service: Cardiovascular;  Laterality: Left;  . TUBAL LIGATION Bilateral     Family History  Problem Relation Age of Onset  . Colon cancer Mother   . HIV Mother   . Colon cancer Sister   . Breast cancer Neg Hx     Social History   Socioeconomic History  . Marital status: Married    Spouse name: Not on file  . Number of children: Not on file  . Years of education: Not on file  . Highest education level: Not on file  Occupational History  . Not on file  Social Needs  . Financial resource strain: Not on file  . Food insecurity    Worry: Not on file    Inability: Not on file  . Transportation needs    Medical: Not on file    Non-medical: Not on file  Tobacco Use  . Smoking status: Former Smoker    Packs/day: 0.25    Types: Cigarettes  .  Smokeless tobacco: Never Used  Substance and Sexual Activity  . Alcohol use: No  . Drug use: No  . Sexual activity: Yes    Birth control/protection: Surgical    Comment: BTL  Lifestyle  . Physical activity    Days per week: Not on file    Minutes per session: Not on file  . Stress: Not on file  Relationships  . Social Herbalist on phone: Not on file    Gets together: Not on file    Attends religious service: Not on file    Active member of club or organization: Not on file    Attends meetings of clubs or organizations: Not on file    Relationship status: Not on file  . Intimate partner violence    Fear of current or ex partner: Not on file    Emotionally  abused: Not on file    Physically abused: Not on file    Forced sexual activity: Not on file  Other Topics Concern  . Not on file  Social History Narrative  . Not on file    Outpatient Medications Prior to Visit  Medication Sig Dispense Refill  . hydrochlorothiazide (HYDRODIURIL) 25 MG tablet Take 1 tablet (25 mg total) by mouth daily. Take on tablet in the morning. 90 tablet 1  . losartan (COZAAR) 50 MG tablet Take 1 tablet (50 mg total) by mouth daily. 90 tablet 1  . methocarbamol (ROBAXIN) 750 MG tablet Take 1 tablet (750 mg total) by mouth 2 (two) times daily as needed for muscle spasms. 60 tablet 0  . omeprazole (PRILOSEC) 40 MG capsule Take 1 capsule (40 mg total) by mouth daily. 30 capsule 1  . Rivaroxaban 15 & 20 MG TBPK Follow package directions: Take one 15mg  tablet by mouth twice a day. On day 22, switch to one 20mg  tablet once a day. Take with food. 51 each 0  . calcium-vitamin D (OSCAL WITH D) 500-200 MG-UNIT tablet Take 1 tablet by mouth 3 (three) times daily. (Patient not taking: Reported on 07/05/2019) 90 tablet 6  . Cholecalciferol (VITAMIN D) 50 MCG (2000 UT) tablet Take 1 tablet (2,000 Units total) by mouth daily. 30 tablet 2  . cyclobenzaprine (FLEXERIL) 5 MG tablet Take 1-2 tablets (5-10 mg total) by mouth 3 (three) times daily as needed for muscle spasms. 30 tablet 3  . HYDROcodone-acetaminophen (NORCO) 5-325 MG tablet Take 1 tablet by mouth daily as needed for moderate pain. (Patient not taking: Reported on 07/05/2019) 14 tablet 0  . HYDROcodone-acetaminophen (NORCO) 5-325 MG tablet Take 1 tablet by mouth daily as needed. (Patient not taking: Reported on 07/05/2019) 10 tablet 0  . mupirocin ointment (BACTROBAN) 2 % Apply 1 application topically 2 (two) times daily. (Patient not taking: Reported on 07/05/2019) 22 g 0   No facility-administered medications prior to visit.     Allergies  Allergen Reactions  . Ativan [Lorazepam] Anxiety    HALLUCINATIONS  . Anesthesia  S-I-40 [Propofol]     hallucinations  . Tylenol With Codeine #3 [Acetaminophen-Codeine] Hives and Itching    ROS Review of Systems  Respiratory: Positive for shortness of breath.        Exertion   Musculoskeletal:       Left ankle      Objective:    Physical Exam  Constitutional: She is oriented to person, place, and time. She appears well-developed and well-nourished.  Morbid obesity  HENT:  Head: Normocephalic.  Neck: Neck supple.  Cardiovascular: Normal rate and regular rhythm.  Pulmonary/Chest: Effort normal and breath sounds normal.  Abdominal: Soft. Bowel sounds are normal. She exhibits distension.  Musculoskeletal: Normal range of motion.     Comments: crutches and scooter  Neurological: She is oriented to person, place, and time.  Skin: Skin is warm and dry.  Psychiatric: She has a normal mood and affect. Her behavior is normal. Judgment and thought content normal.    BP 116/80 (BP Location: Left Arm, Patient Position: Sitting, Cuff Size: Large)   Pulse 80   Temp 98.2 F (36.8 C) (Oral)   Resp 16   Wt 213 lb (96.6 kg)   LMP 06/26/2019 (Exact Date)   SpO2 100%   BMI 40.25 kg/m  Wt Readings from Last 3 Encounters:  07/06/19 213 lb 3.2 oz (96.7 kg)  07/05/19 213 lb (96.6 kg)  06/10/19 218 lb 6.4 oz (99.1 kg)     Health Maintenance Due  Topic Date Due  . PAP SMEAR-Modifier  01/30/1992    There are no preventive care reminders to display for this patient.  Lab Results  Component Value Date   TSH 0.524 05/19/2018   Lab Results  Component Value Date   WBC 9.0 06/10/2019   HGB 10.1 (L) 06/10/2019   HCT 30.1 (L) 06/10/2019   MCV 86.7 06/10/2019   PLT 276 06/10/2019   Lab Results  Component Value Date   NA 137 06/10/2019   K 3.3 (L) 06/10/2019   CO2 22 06/10/2019   GLUCOSE 97 06/10/2019   BUN 7 06/10/2019   CREATININE 0.84 06/10/2019   BILITOT <0.2 03/06/2019   ALKPHOS 97 03/06/2019   AST 13 03/06/2019   ALT 14 03/06/2019   PROT 6.8  03/06/2019   ALBUMIN 4.1 03/06/2019   CALCIUM 8.8 (L) 06/10/2019   ANIONGAP 9 06/10/2019   Lab Results  Component Value Date   CHOL 179 03/06/2019   Lab Results  Component Value Date   HDL 42 03/06/2019   Lab Results  Component Value Date   LDLCALC 115 (H) 03/06/2019   Lab Results  Component Value Date   TRIG 110 03/06/2019   Lab Results  Component Value Date   CHOLHDL 4.3 03/06/2019   No results found for: HGBA1C    Assessment & Plan:  Kameran was seen today for hospitalization follow-up.  Diagnoses and all orders for this visit:  Hypertension, unspecified type Blood pressure today is 116/80 well-controlled on the following medications losartan 50 mg and HCTZ 25 mg both once daily.  She is adherent with low-salt sodium diet exercises as tolerated no changes on current medications at this time  Hospital discharge follow-up Patient recently had a trimalleolar fracture she had left lower extremity edema with shortness of breath and her orthopedist ordered a venous LLE doppler indicating a DVT and she was sent to ER.  Hospital discharge instructions were to follow-up with PCP in 1 to 2 weeks and vascular surgery as directed.  Acute deep vein thrombosis (DVT) of other specified vein of left lower extremity (HCC) Risk factors associated with DVT trimalleolar fracture, obesity and a current smoker to fall with cardiovascular currently on Xarelto 20 mg daily  Smokes tobacco daily Patient is aware of risk for smoking increased respiratory infections and disease each visit each visit we will discuss cessation.   No orders of the defined types were placed in this encounter.   Follow-up: Return if symptoms worsen or fail to improve, for after released from specialist .  Kerin Perna, NP

## 2019-07-05 NOTE — Progress Notes (Signed)
Fall 02/2019, Left ankle fracture Pain 7/10

## 2019-07-06 ENCOUNTER — Ambulatory Visit (INDEPENDENT_AMBULATORY_CARE_PROVIDER_SITE_OTHER): Payer: Medicare Other | Admitting: Vascular Surgery

## 2019-07-06 ENCOUNTER — Ambulatory Visit (HOSPITAL_COMMUNITY)
Admission: RE | Admit: 2019-07-06 | Discharge: 2019-07-06 | Disposition: A | Discharge status: Home / Self Care | Payer: Medicare Other | Source: Ambulatory Visit | Attending: Vascular Surgery | Admitting: Vascular Surgery

## 2019-07-06 ENCOUNTER — Encounter: Payer: Self-pay | Admitting: Vascular Surgery

## 2019-07-06 VITALS — BP 137/87 | HR 69 | Temp 97.3°F | Resp 20 | Ht 61.0 in | Wt 213.2 lb

## 2019-07-06 DIAGNOSIS — M25672 Stiffness of left ankle, not elsewhere classified: Secondary | ICD-10-CM | POA: Diagnosis not present

## 2019-07-06 DIAGNOSIS — R269 Unspecified abnormalities of gait and mobility: Secondary | ICD-10-CM | POA: Diagnosis not present

## 2019-07-06 DIAGNOSIS — I871 Compression of vein: Secondary | ICD-10-CM

## 2019-07-06 DIAGNOSIS — I82402 Acute embolism and thrombosis of unspecified deep veins of left lower extremity: Secondary | ICD-10-CM | POA: Diagnosis not present

## 2019-07-06 DIAGNOSIS — M25472 Effusion, left ankle: Secondary | ICD-10-CM | POA: Diagnosis not present

## 2019-07-06 DIAGNOSIS — M62572 Muscle wasting and atrophy, not elsewhere classified, left ankle and foot: Secondary | ICD-10-CM | POA: Diagnosis not present

## 2019-07-06 DIAGNOSIS — M25572 Pain in left ankle and joints of left foot: Secondary | ICD-10-CM | POA: Diagnosis not present

## 2019-07-06 NOTE — Progress Notes (Signed)
Patient is a 48 year old female who returns for follow-up today.  She underwent mechanical thrombectomy of her venous femoral popliteal iliac vein as well as left common iliac venous stenting June 09 2019.  She states that most of the swelling has resolved except for some swelling around her ankle where she recently had orthopedic procedures performed.  She is currently on Xarelto but is running short on her medication.  She has about 5 tablets left.  Other chronic medical problems include arthritis reflux hypertension all of which have been stable.  Review of systems: She has no shortness of breath.  She has no chest pain.  Past Medical History:  Diagnosis Date  . Anemia    low iron during pregnancy  . Anxiety   . Arthritis   . Asthma    as a child  . Bipolar disorder ([place])   . Depression   . GERD (gastroesophageal reflux disease)   . Headache   . Hypertension   . Left trimalleolar fracture   . Pneumonia    as a child  . PONV (postoperative nausea and vomiting)   . Restless legs    Current Outpatient Medications on File Prior to Visit  Medication Sig Dispense Refill  . Cholecalciferol (VITAMIN D) 50 MCG (2000 UT) tablet Take 1 tablet (2,000 Units total) by mouth daily. 30 tablet 2  . cyclobenzaprine (FLEXERIL) 5 MG tablet Take 1-2 tablets (5-10 mg total) by mouth 3 (three) times daily as needed for muscle spasms. 30 tablet 3  . hydrochlorothiazide (HYDRODIURIL) 25 MG tablet Take 1 tablet (25 mg total) by mouth daily. Take on tablet in the morning. 90 tablet 1  . losartan (COZAAR) 50 MG tablet Take 1 tablet (50 mg total) by mouth daily. 90 tablet 1  . methocarbamol (ROBAXIN) 750 MG tablet Take 1 tablet (750 mg total) by mouth 2 (two) times daily as needed for muscle spasms. 60 tablet 0  . omeprazole (PRILOSEC) 40 MG capsule Take 1 capsule (40 mg total) by mouth daily. 30 capsule 1  . Rivaroxaban 15 & 20 MG TBPK Follow package directions: Take one 15mg  tablet by mouth twice a day. On  day 22, switch to one 20mg  tablet once a day. Take with food. 51 each 0  . calcium-vitamin D (OSCAL WITH D) 500-200 MG-UNIT tablet Take 1 tablet by mouth 3 (three) times daily. (Patient not taking: Reported on 07/05/2019) 90 tablet 6   No current facility-administered medications on file prior to visit.     Social History   Socioeconomic History  . Marital status: Married    Spouse name: Not on file  . Number of children: Not on file  . Years of education: Not on file  . Highest education level: Not on file  Occupational History  . Not on file  Social Needs  . Financial resource strain: Not on file  . Food insecurity    Worry: Not on file    Inability: Not on file  . Transportation needs    Medical: Not on file    Non-medical: Not on file  Tobacco Use  . Smoking status: Former Smoker    Packs/day: 0.25    Types: Cigarettes  . Smokeless tobacco: Never Used  Substance and Sexual Activity  . Alcohol use: No  . Drug use: No  . Sexual activity: Yes    Birth control/protection: Surgical    Comment: BTL  Lifestyle  . Physical activity    Days per week: Not on file  Minutes per session: Not on file  . Stress: Not on file  Relationships  . Social Herbalist on phone: Not on file    Gets together: Not on file    Attends religious service: Not on file    Active member of club or organization: Not on file    Attends meetings of clubs or organizations: Not on file    Relationship status: Not on file  . Intimate partner violence    Fear of current or ex partner: Not on file    Emotionally abused: Not on file    Physically abused: Not on file    Forced sexual activity: Not on file  Other Topics Concern  . Not on file  Social History Narrative  . Not on file    Physical exam:  Vitals:   07/06/19 0913  BP: 137/87  Pulse: 69  Resp: 20  Temp: (!) 97.3 F (36.3 C)  SpO2: 100%  Weight: 213 lb 3.2 oz (96.7 kg)  Height: 5\' 1"  (1.549 m)    Extremities: 2+  dorsalis pedis pulses bilaterally  Musculoskeletal: Trace edema around the ankle left leg well-healed incision  Data: Patient had a caval iliac duplex today.  Unfortunately due to overlying bowel gas the common iliac stent was not well visualized although the inferior vena cava and common iliac veins were thought to be patent.  The portion of the stent visualized in the external iliac vein was also patent.  Assessment: Doing well status post left iliac venous stenting and mechanical thrombectomy.  Patient has minimal swelling symptoms at this point.  Plan: She will continue her Xarelto for at least a total of 6 months time then we will reevaluate.  Patient needs a repeat caval iliac duplex scan in 6 months time and schedule for our APP clinic.  Ruta Hinds, MD [facility] Office: [phone]

## 2019-07-07 ENCOUNTER — Other Ambulatory Visit: Payer: Self-pay | Admitting: *Deleted

## 2019-07-07 MED ORDER — RIVAROXABAN 20 MG PO TABS: 20.0000 mg | ORAL_TABLET | Freq: Every day | ORAL | 6 refills | Status: DC

## 2019-07-10 ENCOUNTER — Telehealth: Payer: Self-pay

## 2019-07-10 NOTE — Telephone Encounter (Signed)
Pt called and said that she is having a lot of heavy bleeding with her menses and said that she has passed a lot of clots. She said that this is also her second period for the month. She said that it was not like this before and the Xarelto she thinks is the cause.   Left message for pt to call her GYN to see what they advise as she needs to stay on the blood thinner.   York Cerise, CMA

## 2019-07-12 ENCOUNTER — Telehealth: Payer: Self-pay | Admitting: Orthopaedic Surgery

## 2019-07-12 ENCOUNTER — Encounter: Payer: Self-pay | Admitting: Orthopaedic Surgery

## 2019-07-12 NOTE — Telephone Encounter (Signed)
Patient called stating that her ankle is not getting any better and that she is in a lot of pain.  She can not take the Percocet, but wanted to know if she could get something else for pain.  CB#[phone].  Thank you.

## 2019-07-17 ENCOUNTER — Other Ambulatory Visit: Payer: Self-pay

## 2019-07-17 MED ORDER — TRAMADOL HCL 50 MG PO TABS: ORAL_TABLET | ORAL | 0 refills | Status: DC

## 2019-07-17 NOTE — Telephone Encounter (Signed)
Rx called into pharm. Patient aware. Sent a Comptroller.

## 2019-07-17 NOTE — Telephone Encounter (Signed)
Can you call in tramadol 50mg  1-2 q6hrs prn #30

## 2019-07-17 NOTE — Telephone Encounter (Signed)
Sent you a message about calling in tramadol

## 2019-07-18 DIAGNOSIS — M25572 Pain in left ankle and joints of left foot: Secondary | ICD-10-CM | POA: Diagnosis not present

## 2019-07-18 DIAGNOSIS — R269 Unspecified abnormalities of gait and mobility: Secondary | ICD-10-CM | POA: Diagnosis not present

## 2019-07-18 DIAGNOSIS — M25672 Stiffness of left ankle, not elsewhere classified: Secondary | ICD-10-CM | POA: Diagnosis not present

## 2019-07-18 DIAGNOSIS — M25472 Effusion, left ankle: Secondary | ICD-10-CM | POA: Diagnosis not present

## 2019-07-18 DIAGNOSIS — M62572 Muscle wasting and atrophy, not elsewhere classified, left ankle and foot: Secondary | ICD-10-CM | POA: Diagnosis not present

## 2019-07-20 ENCOUNTER — Other Ambulatory Visit: Payer: Self-pay

## 2019-07-21 DIAGNOSIS — M25472 Effusion, left ankle: Secondary | ICD-10-CM | POA: Diagnosis not present

## 2019-07-21 DIAGNOSIS — R269 Unspecified abnormalities of gait and mobility: Secondary | ICD-10-CM | POA: Diagnosis not present

## 2019-07-21 DIAGNOSIS — M25672 Stiffness of left ankle, not elsewhere classified: Secondary | ICD-10-CM | POA: Diagnosis not present

## 2019-07-21 DIAGNOSIS — M62572 Muscle wasting and atrophy, not elsewhere classified, left ankle and foot: Secondary | ICD-10-CM | POA: Diagnosis not present

## 2019-07-21 DIAGNOSIS — M25572 Pain in left ankle and joints of left foot: Secondary | ICD-10-CM | POA: Diagnosis not present

## 2019-07-27 ENCOUNTER — Encounter: Payer: Self-pay | Admitting: Orthopaedic Surgery

## 2019-07-27 ENCOUNTER — Ambulatory Visit: Payer: Self-pay

## 2019-07-27 ENCOUNTER — Other Ambulatory Visit: Payer: Self-pay

## 2019-07-27 ENCOUNTER — Ambulatory Visit (INDEPENDENT_AMBULATORY_CARE_PROVIDER_SITE_OTHER): Payer: Medicare Other | Admitting: Orthopaedic Surgery

## 2019-07-27 DIAGNOSIS — S82852G Displaced trimalleolar fracture of left lower leg, subsequent encounter for closed fracture with delayed healing: Secondary | ICD-10-CM

## 2019-07-27 DIAGNOSIS — I82402 Acute embolism and thrombosis of unspecified deep veins of left lower extremity: Secondary | ICD-10-CM

## 2019-07-27 NOTE — Progress Notes (Signed)
Office Visit Note   Patient: Patricia Davila           Date of Birth: 16-Apr-1971           MRN: [id number] Visit Date: 07/27/2019              Requested by: Clent Demark, PA-C No address on file PCP: Clent Demark, PA-C   Assessment & Plan: Visit Diagnoses:  1. Displaced trimalleolar fracture of left lower leg, subsequent encounter for closed fracture with delayed healing     Plan: Overall the swelling is consistent with postsurgical changes and her DVT.  TED hose was prescribed today.  Postop shoe given to help with stability.  Continue with PT.  Follow-up in 6 weeks with three-view x-rays of the left ankle.  Continue with the bone stimulator in the meantime.  Follow-Up Instructions: Return in about 6 weeks (around 09/07/2019).   Orders:  Orders Placed This Encounter  Procedures  . XR Ankle Complete Left   No orders of the defined types were placed in this encounter.     Procedures: No procedures performed   Clinical Data: No additional findings.   Subjective: Chief Complaint  Patient presents with  . Left Ankle - Follow-up    Patricia Davila is here for follow-up of her left ankle fracture.  She is overall doing well.  She still notices swelling of the lateral aspect of her ankle.  She is having trouble with shoe wear as a result.  She continues to do physical therapy.   Review of Systems   Objective: Vital Signs: There were no vitals taken for this visit.  Physical Exam  Ortho Exam Left ankle exam shows a fully healed surgical scar.  There is no tenderness palpation other than the swollen area on the lateral side. Specialty Comments:  No specialty comments available.  Imaging: XR Ankle Complete Left  Result Date: 07/27/2019 Stable fixation of the trimalleolar ankle fracture.  There is evidence of progressive bony consolidation and healing of the fibula fracture.    PMFS History: Patient Active Problem List   Diagnosis Date Noted  . Pulmonary  embolism ([place]) 06/07/2019  . Left leg DVT ([place]) 06/07/2019  . Displaced trimalleolar fracture of left lower leg, subsequent encounter for closed fracture with delayed healing 03/17/2019  . Primary osteoarthritis of right hip 04/08/2017  . History of hip replacement 03/25/2017  . Primary osteoarthritis of left hip 12/22/2016  . Major depressive disorder 10/24/2012  . Smokes tobacco daily 05/13/2010  . Headache(784.0) 05/13/2010  . Microalbuminuria 04/16/2009  . GERD 11/22/2008  . Left hip pain 11/22/2008  . Bipolar 1 disorder, depressed ([place]) 05/17/2007  . Lumbosacral degenerative disk disease 03/23/2005  . Essential hypertension, benign 08/18/2003   Past Medical History:  Diagnosis Date  . Anemia    low iron during pregnancy  . Anxiety   . Arthritis   . Asthma    as a child  . Bipolar disorder ([place])   . Depression   . GERD (gastroesophageal reflux disease)   . Headache   . Hypertension   . Left trimalleolar fracture   . Pneumonia    as a child  . PONV (postoperative nausea and vomiting)   . Restless legs     Family History  Problem Relation Age of Onset  . Colon cancer Mother   . HIV Mother   . Colon cancer Sister   . Breast cancer Neg Hx     Past Surgical History:  Procedure Laterality Date  . BILATERAL ANTERIOR TOTAL HIP ARTHROPLASTY Bilateral 03/25/2017   Procedure: BILATERAL ANTERIOR TOTAL HIP ARTHROPLASTY;  Surgeon: Leandrew Koyanagi, MD;  Location: [facility];  Service: Orthopedics;  Laterality: Bilateral;  . DENTAL SURGERY     all teeth extracted  . INTRAVASCULAR ULTRASOUND/IVUS N/A 06/09/2019   Procedure: INTRAVASCULAR ULTRASOUND/IVUS;  Surgeon: Elam Dutch, MD;  Location: [facility];  Service: Cardiovascular;  Laterality: N/A;  . LAMINECTOMY AND MICRODISCECTOMY LUMBAR SPINE   06/27/2010  . ORIF ANKLE FRACTURE Left 03/17/2019   Procedure: OPEN REDUCTION INTERNAL FIXATION (ORIF) LEFT ANKLE FRACTURE;  Surgeon: Leandrew Koyanagi, MD;  Location: [facility];  Service: Orthopedics;  Laterality: Left;  . PERIPHERAL VASCULAR INTERVENTION Left 06/09/2019   Procedure: PERIPHERAL VASCULAR INTERVENTION;  Surgeon: Elam Dutch, MD;  Location: [facility];  Service: Cardiovascular;  Laterality: Left;  Common Iliac vein  . PERIPHERAL VASCULAR THROMBECTOMY Left 06/09/2019   Procedure: PERIPHERAL VASCULAR THROMBECTOMY;  Surgeon: Elam Dutch, MD;  Location: [facility];  Service: Cardiovascular;  Laterality: Left;  . TUBAL LIGATION Bilateral    Social History   Occupational History  . Not on file  Tobacco Use  . Smoking status: Former Smoker    Packs/day: 0.25    Types: Cigarettes  . Smokeless tobacco: Never Used  Substance and Sexual Activity  . Alcohol use: No  . Drug use: No  . Sexual activity: Yes    Birth control/protection: Surgical    Comment: BTL

## 2019-07-28 DIAGNOSIS — M25572 Pain in left ankle and joints of left foot: Secondary | ICD-10-CM | POA: Diagnosis not present

## 2019-07-28 DIAGNOSIS — R269 Unspecified abnormalities of gait and mobility: Secondary | ICD-10-CM | POA: Diagnosis not present

## 2019-07-28 DIAGNOSIS — M62572 Muscle wasting and atrophy, not elsewhere classified, left ankle and foot: Secondary | ICD-10-CM | POA: Diagnosis not present

## 2019-07-28 DIAGNOSIS — M25472 Effusion, left ankle: Secondary | ICD-10-CM | POA: Diagnosis not present

## 2019-07-28 DIAGNOSIS — M25672 Stiffness of left ankle, not elsewhere classified: Secondary | ICD-10-CM | POA: Diagnosis not present

## 2019-08-01 ENCOUNTER — Other Ambulatory Visit: Payer: Self-pay | Admitting: Physician Assistant

## 2019-08-13 ENCOUNTER — Encounter: Payer: Self-pay | Admitting: Orthopaedic Surgery

## 2019-08-13 ENCOUNTER — Encounter (INDEPENDENT_AMBULATORY_CARE_PROVIDER_SITE_OTHER): Payer: Self-pay | Admitting: Primary Care

## 2019-08-13 ENCOUNTER — Other Ambulatory Visit: Payer: Self-pay | Admitting: Physician Assistant

## 2019-08-13 ENCOUNTER — Other Ambulatory Visit (INDEPENDENT_AMBULATORY_CARE_PROVIDER_SITE_OTHER): Payer: Self-pay | Admitting: Primary Care

## 2019-08-13 DIAGNOSIS — Z760 Encounter for issue of repeat prescription: Secondary | ICD-10-CM

## 2019-08-14 ENCOUNTER — Other Ambulatory Visit: Payer: Self-pay

## 2019-08-14 ENCOUNTER — Telehealth: Payer: Self-pay | Admitting: Orthopaedic Surgery

## 2019-08-14 MED ORDER — TRAMADOL HCL 50 MG PO TABS: ORAL_TABLET | ORAL | 0 refills | Status: DC

## 2019-08-14 NOTE — Telephone Encounter (Signed)
FWD to PCP

## 2019-08-14 NOTE — Telephone Encounter (Signed)
Ok to send in tramadol 50mg   1-2 tid prn pain #30

## 2019-08-14 NOTE — Telephone Encounter (Signed)
Patient called. She needs a refill on on her traMADol.   Call back number: [phone]

## 2019-08-15 ENCOUNTER — Other Ambulatory Visit: Payer: Self-pay | Admitting: Physician Assistant

## 2019-08-15 NOTE — Telephone Encounter (Signed)
I called this in yesterday. Did she not get it?

## 2019-08-15 NOTE — Telephone Encounter (Signed)
  If yes, send into her pharm. Thanks.

## 2019-08-17 NOTE — Telephone Encounter (Signed)
Called patient no answer.

## 2019-09-07 ENCOUNTER — Other Ambulatory Visit: Payer: Self-pay

## 2019-09-07 ENCOUNTER — Ambulatory Visit: Payer: Self-pay

## 2019-09-07 ENCOUNTER — Ambulatory Visit (INDEPENDENT_AMBULATORY_CARE_PROVIDER_SITE_OTHER): Payer: Medicare Other | Admitting: Orthopaedic Surgery

## 2019-09-07 DIAGNOSIS — S82852G Displaced trimalleolar fracture of left lower leg, subsequent encounter for closed fracture with delayed healing: Secondary | ICD-10-CM | POA: Diagnosis not present

## 2019-09-07 NOTE — Progress Notes (Signed)
Office Visit Note   Patient: Patricia Davila           Date of Birth: 1970/11/08           MRN: [id number] Visit Date: 09/07/2019              Requested by: Clent Demark, PA-C No address on file PCP: Clent Demark, PA-C   Assessment & Plan: Visit Diagnoses:  1. Displaced trimalleolar fracture of left lower leg, subsequent encounter for closed fracture with delayed healing     Plan: She continues to wear the bone stimulator at night.  She will continue with physical therapy for strengthening.  Her hips have also gotten weak due to immobility from the ankle fracture and surgery and DVT.  From the ankle standpoint we will release her and follow-up as needed.  She will continue with physical therapy.  I would like to see her back in about 7 months for her 3-year checkup for her bilateral hip replacements.  She will need standing AP pelvis and bilateral lateral hips at that time.  Follow-Up Instructions: Return in about 7 months (around 04/06/2020), or if symptoms worsen or fail to improve.   Orders:  Orders Placed This Encounter  Procedures  . XR Ankle Complete Left   No orders of the defined types were placed in this encounter.     Procedures: No procedures performed   Clinical Data: No additional findings.   Subjective: Chief Complaint  Patient presents with  . Left Ankle - Follow-up    Patricia Davila is nearly 6 months status post ORIF of the left trimalleolar ankle fracture.  Unfortunately she developed a large DVT in her left lower extremity for which she is on anticoagulation.  She is overall doing much better.  She still has swelling but this is also significantly better.  Her range of motion and strength are also improving with physical therapy.  She has a small amount of pain occasionally.   Review of Systems   Objective: Vital Signs: There were no vitals taken for this visit.  Physical Exam  Ortho Exam Left ankle exam shows fully healed surgical scars.   She still has moderate swelling.  No significant tenderness to the bone. Specialty Comments:  No specialty comments available.  Imaging: XR Ankle Complete Left  Result Date: 09/07/2019 Healed trimalleolar ankle fracture without hardware complication.    PMFS History: Patient Active Problem List   Diagnosis Date Noted  . Pulmonary embolism ([place]) 06/07/2019  . Left leg DVT ([place]) 06/07/2019  . Displaced trimalleolar fracture of left lower leg, subsequent encounter for closed fracture with delayed healing 03/17/2019  . Primary osteoarthritis of right hip 04/08/2017  . History of hip replacement 03/25/2017  . Primary osteoarthritis of left hip 12/22/2016  . Major depressive disorder 10/24/2012  . Smokes tobacco daily 05/13/2010  . Headache(784.0) 05/13/2010  . Microalbuminuria 04/16/2009  . GERD 11/22/2008  . Left hip pain 11/22/2008  . Bipolar 1 disorder, depressed ([place]) 05/17/2007  . Lumbosacral degenerative disk disease 03/23/2005  . Essential hypertension, benign 08/18/2003   Past Medical History:  Diagnosis Date  . Anemia    low iron during pregnancy  . Anxiety   . Arthritis   . Asthma    as a child  . Bipolar disorder ([place])   . Depression   . GERD (gastroesophageal reflux disease)   . Headache   . Hypertension   . Left trimalleolar fracture   . Pneumonia  as a child  . PONV (postoperative nausea and vomiting)   . Restless legs     Family History  Problem Relation Age of Onset  . Colon cancer Mother   . HIV Mother   . Colon cancer Sister   . Breast cancer Neg Hx     Past Surgical History:  Procedure Laterality Date  . BILATERAL ANTERIOR TOTAL HIP ARTHROPLASTY Bilateral 03/25/2017   Procedure: BILATERAL ANTERIOR TOTAL HIP ARTHROPLASTY;  Surgeon: Leandrew Koyanagi, MD;  Location: [place];  Service: Orthopedics;  Laterality: Bilateral;  . DENTAL SURGERY     all teeth extracted  . INTRAVASCULAR ULTRASOUND/IVUS N/A 06/09/2019   Procedure: INTRAVASCULAR  ULTRASOUND/IVUS;  Surgeon: Elam Dutch, MD;  Location: [facility] [facility];  Service: Cardiovascular;  Laterality: N/A;  . LAMINECTOMY AND MICRODISCECTOMY LUMBAR SPINE   06/27/2010  . ORIF ANKLE FRACTURE Left 03/17/2019   Procedure: OPEN REDUCTION INTERNAL FIXATION (ORIF) LEFT ANKLE FRACTURE;  Surgeon: Leandrew Koyanagi, MD;  Location: [facility];  Service: Orthopedics;  Laterality: Left;  . PERIPHERAL VASCULAR INTERVENTION Left 06/09/2019   Procedure: PERIPHERAL VASCULAR INTERVENTION;  Surgeon: Elam Dutch, MD;  Location: [facility];  Service: Cardiovascular;  Laterality: Left;  Common Iliac vein  . PERIPHERAL VASCULAR THROMBECTOMY Left 06/09/2019   Procedure: PERIPHERAL VASCULAR THROMBECTOMY;  Surgeon: Elam Dutch, MD;  Location: [facility];  Service: Cardiovascular;  Laterality: Left;  . TUBAL LIGATION Bilateral    Social History   Occupational History  . Not on file  Tobacco Use  . Smoking status: Former Smoker    Packs/day: 0.25    Types: Cigarettes  . Smokeless tobacco: Never Used  Substance and Sexual Activity  . Alcohol use: No  . Drug use: No  . Sexual activity: Yes    Birth control/protection: Surgical    Comment: BTL

## 2019-09-11 ENCOUNTER — Other Ambulatory Visit (INDEPENDENT_AMBULATORY_CARE_PROVIDER_SITE_OTHER): Payer: Self-pay | Admitting: Primary Care

## 2019-09-11 DIAGNOSIS — Z760 Encounter for issue of repeat prescription: Secondary | ICD-10-CM

## 2019-09-12 NOTE — Telephone Encounter (Signed)
Sent to pcp

## 2019-09-14 ENCOUNTER — Other Ambulatory Visit: Payer: Self-pay

## 2019-09-14 ENCOUNTER — Telehealth: Payer: Self-pay | Admitting: Orthopaedic Surgery

## 2019-09-14 DIAGNOSIS — S82852G Displaced trimalleolar fracture of left lower leg, subsequent encounter for closed fracture with delayed healing: Secondary | ICD-10-CM

## 2019-09-14 NOTE — Telephone Encounter (Signed)
Patient called. She would like a referral sent to [facility] for PT. Also she would like a refill on Robaxin and Tramadol. Her call back number is [phone]

## 2019-09-14 NOTE — Telephone Encounter (Signed)
PT order made. Please advise on meds. If yes, please send into pharm.

## 2019-09-15 ENCOUNTER — Other Ambulatory Visit: Payer: Self-pay | Admitting: Physician Assistant

## 2019-09-15 MED ORDER — METHOCARBAMOL 750 MG PO TABS: 750.0000 mg | ORAL_TABLET | Freq: Two times a day (BID) | ORAL | 0 refills | Status: DC | PRN

## 2019-09-15 MED ORDER — TRAMADOL HCL 50 MG PO TABS: ORAL_TABLET | ORAL | 0 refills | Status: DC

## 2019-09-15 NOTE — Telephone Encounter (Signed)
Sent in meds 

## 2019-09-19 ENCOUNTER — Encounter (INDEPENDENT_AMBULATORY_CARE_PROVIDER_SITE_OTHER): Payer: Self-pay | Admitting: Primary Care

## 2019-09-19 ENCOUNTER — Ambulatory Visit (INDEPENDENT_AMBULATORY_CARE_PROVIDER_SITE_OTHER): Payer: Medicare Other | Admitting: Primary Care

## 2019-09-19 ENCOUNTER — Other Ambulatory Visit: Payer: Self-pay

## 2019-09-19 ENCOUNTER — Other Ambulatory Visit (INDEPENDENT_AMBULATORY_CARE_PROVIDER_SITE_OTHER): Payer: Self-pay | Admitting: Primary Care

## 2019-09-19 VITALS — BP 118/83 | HR 82 | Temp 97.3°F | Ht 61.0 in | Wt 210.4 lb

## 2019-09-19 DIAGNOSIS — D649 Anemia, unspecified: Secondary | ICD-10-CM | POA: Diagnosis not present

## 2019-09-19 DIAGNOSIS — I82492 Acute embolism and thrombosis of other specified deep vein of left lower extremity: Secondary | ICD-10-CM | POA: Diagnosis not present

## 2019-09-19 DIAGNOSIS — K219 Gastro-esophageal reflux disease without esophagitis: Secondary | ICD-10-CM

## 2019-09-19 DIAGNOSIS — I10 Essential (primary) hypertension: Secondary | ICD-10-CM

## 2019-09-19 DIAGNOSIS — Z1231 Encounter for screening mammogram for malignant neoplasm of breast: Secondary | ICD-10-CM

## 2019-09-19 DIAGNOSIS — E782 Mixed hyperlipidemia: Secondary | ICD-10-CM | POA: Diagnosis not present

## 2019-09-19 DIAGNOSIS — Z6839 Body mass index (BMI) 39.0-39.9, adult: Secondary | ICD-10-CM

## 2019-09-19 DIAGNOSIS — Z760 Encounter for issue of repeat prescription: Secondary | ICD-10-CM

## 2019-09-19 MED ORDER — HYDROCHLOROTHIAZIDE 25 MG PO TABS: 25.0000 mg | ORAL_TABLET | Freq: Every day | ORAL | 1 refills | Status: DC

## 2019-09-19 MED ORDER — OMEPRAZOLE 40 MG PO CPDR: 40.0000 mg | DELAYED_RELEASE_CAPSULE | Freq: Every day | ORAL | 3 refills | Status: DC

## 2019-09-19 NOTE — Patient Instructions (Signed)

## 2019-09-19 NOTE — Progress Notes (Signed)
Established Patient Office Visit  Subjective:  Patient ID: Patricia Davila, female    DOB: November 14, 1970  Age: 49 y.o. MRN: [id number]  CC:  Chief Complaint  Patient presents with  . Hypertension  . Medication Refill    HPI Patricia Davila presents for management of hypertension, hyperlipidemia  and requesting medication refills. She, headaches, chest pain or lower extremity edema. Endorses  shortness of breath but contributes that to her weight.  Past Medical History:  Diagnosis Date  . Anemia    low iron during pregnancy  . Anxiety   . Arthritis   . Asthma    as a child  . Bipolar disorder ([place])   . Depression   . GERD (gastroesophageal reflux disease)   . Headache   . Hypertension   . Left trimalleolar fracture   . Pneumonia    as a child  . PONV (postoperative nausea and vomiting)   . Restless legs     Past Surgical History:  Procedure Laterality Date  . BILATERAL ANTERIOR TOTAL HIP ARTHROPLASTY Bilateral 03/25/2017   Procedure: BILATERAL ANTERIOR TOTAL HIP ARTHROPLASTY;  Surgeon: Leandrew Koyanagi, MD;  Location: [place];  Service: Orthopedics;  Laterality: Bilateral;  . DENTAL SURGERY     all teeth extracted  . INTRAVASCULAR ULTRASOUND/IVUS N/A 06/09/2019   Procedure: INTRAVASCULAR ULTRASOUND/IVUS;  Surgeon: Elam Dutch, MD;  Location: [place] [facility];  Service: Cardiovascular;  Laterality: N/A;  . LAMINECTOMY AND MICRODISCECTOMY LUMBAR SPINE   06/27/2010  . ORIF ANKLE FRACTURE Left 03/17/2019   Procedure: OPEN REDUCTION INTERNAL FIXATION (ORIF) LEFT ANKLE FRACTURE;  Surgeon: Leandrew Koyanagi, MD;  Location: [facility];  Service: Orthopedics;  Laterality: Left;  . PERIPHERAL VASCULAR INTERVENTION Left 06/09/2019   Procedure: PERIPHERAL VASCULAR INTERVENTION;  Surgeon: Elam Dutch, MD;  Location: [facility];  Service: Cardiovascular;  Laterality: Left;  Common Iliac vein  . PERIPHERAL VASCULAR THROMBECTOMY Left 06/09/2019   Procedure:  PERIPHERAL VASCULAR THROMBECTOMY;  Surgeon: Elam Dutch, MD;  Location: [place];  Service: Cardiovascular;  Laterality: Left;  . TUBAL LIGATION Bilateral     Family History  Problem Relation Age of Onset  . Colon cancer Mother   . HIV Mother   . Colon cancer Sister   . Breast cancer Neg Hx     Social History   Socioeconomic History  . Marital status: Married    Spouse name: Not on file  . Number of children: Not on file  . Years of education: Not on file  . Highest education level: Not on file  Occupational History  . Not on file  Tobacco Use  . Smoking status: Former Smoker    Packs/day: 0.25    Types: Cigarettes  . Smokeless tobacco: Never Used  Substance and Sexual Activity  . Alcohol use: No  . Drug use: No  . Sexual activity: Yes    Birth control/protection: Surgical    Comment: BTL  Other Topics Concern  . Not on file  Social History Narrative  . Not on file   Social Determinants of Health   Financial Resource Strain:   . Difficulty of Paying Living Expenses: Not on file  Food Insecurity:   . Worried About Charity fundraiser in the Last Year: Not on file  . Ran Out of Food in the Last Year: Not on file  Transportation Needs:   . Lack of Transportation (Medical): Not on file  . Lack  of Transportation (Non-Medical): Not on file  Physical Activity:   . Days of Exercise per Week: Not on file  . Minutes of Exercise per Session: Not on file  Stress:   . Feeling of Stress : Not on file  Social Connections:   . Frequency of Communication with Friends and Family: Not on file  . Frequency of Social Gatherings with Friends and Family: Not on file  . Attends Religious Services: Not on file  . Active Member of Clubs or Organizations: Not on file  . Attends Archivist Meetings: Not on file  . Marital Status: Not on file  Intimate Partner Violence:   . Fear of Current or Ex-Partner: Not on file  . Emotionally Abused: Not on file  .  Physically Abused: Not on file  . Sexually Abused: Not on file    Outpatient Medications Prior to Visit  Medication Sig Dispense Refill  . Cholecalciferol (VITAMIN D3) 50 MCG (2000 UT) TABS TAKE 1 TABLET BY MOUTH EVERY DAY 30 tablet 2  . cyclobenzaprine (FLEXERIL) 5 MG tablet Take 1-2 tablets (5-10 mg total) by mouth 3 (three) times daily as needed for muscle spasms. 30 tablet 3  . rivaroxaban (XARELTO) 20 MG TABS tablet Take 1 tablet (20 mg total) by mouth daily with supper. 30 tablet 6  . traMADol (ULTRAM) 50 MG tablet Take 1-2 tabs po tid prn pain 30 tablet 0  . hydrochlorothiazide (HYDRODIURIL) 25 MG tablet Take 1 tablet (25 mg total) by mouth daily. Take on tablet in the morning. 90 tablet 1  . losartan (COZAAR) 50 MG tablet Take 1 tablet (50 mg total) by mouth daily. 90 tablet 1  . omeprazole (PRILOSEC) 40 MG capsule Take 1 capsule (40 mg total) by mouth daily. 30 capsule 1  . methocarbamol (ROBAXIN) 750 MG tablet Take 1 tablet (750 mg total) by mouth 2 (two) times daily as needed for muscle spasms. (Patient not taking: Reported on 09/19/2019) 60 tablet 0  . calcium-vitamin D (OSCAL WITH D) 500-200 MG-UNIT tablet Take 1 tablet by mouth 3 (three) times daily. (Patient not taking: Reported on 07/05/2019) 90 tablet 6   No facility-administered medications prior to visit.    Allergies  Allergen Reactions  . Ativan [Lorazepam] Anxiety    HALLUCINATIONS  . Anesthesia S-I-40 [Propofol]     hallucinations  . Tylenol With Codeine #3 [Acetaminophen-Codeine] Hives and Itching    ROS Review of Systems  Constitutional: Positive for fatigue.  Gastrointestinal: Positive for abdominal pain.  Musculoskeletal: Positive for back pain and gait problem.       Feels like pain from bilateral THR, left ankle fx - Screws in back from a slip disc   All other systems reviewed and are negative.     Objective:    Physical Exam  Constitutional: She is oriented to person, place, and time. She appears  well-developed and well-nourished.  HENT:  Head: Normocephalic.  Eyes: Pupils are equal, round, and reactive to light. EOM are normal.  Cardiovascular: Normal rate and regular rhythm.  Pulmonary/Chest: Effort normal and breath sounds normal.  Abdominal: Soft. Bowel sounds are normal.  Musculoskeletal:        General: Normal range of motion.     Cervical back: Normal range of motion and neck supple.  Neurological: She is oriented to person, place, and time.  Skin: Skin is warm and dry.  Psychiatric: She has a normal mood and affect.    BP 118/83 (BP Location: Left Arm, Patient Position:  Sitting, Cuff Size: Large)   Pulse 82   Temp (!) 97.3 F (36.3 C) (Temporal)   Ht 5\' 1"  (1.549 m)   Wt 210 lb 6.4 oz (95.4 kg)   LMP 09/18/2019 (Exact Date)   SpO2 98%   BMI 39.75 kg/m  Wt Readings from Last 3 Encounters:  09/19/19 210 lb 6.4 oz (95.4 kg)  07/06/19 213 lb 3.2 oz (96.7 kg)  07/05/19 213 lb (96.6 kg)     Health Maintenance Due  Topic Date Due  . PAP SMEAR-Modifier  01/30/1992    There are no preventive care reminders to display for this patient.  Lab Results  Component Value Date   TSH 0.524 05/19/2018   Lab Results  Component Value Date   WBC 9.0 06/10/2019   HGB 10.1 (L) 06/10/2019   HCT 30.1 (L) 06/10/2019   MCV 86.7 06/10/2019   PLT 276 06/10/2019   Lab Results  Component Value Date   NA 137 06/10/2019   K 3.3 (L) 06/10/2019   CO2 22 06/10/2019   GLUCOSE 97 06/10/2019   BUN 7 06/10/2019   CREATININE 0.84 06/10/2019   BILITOT <0.2 03/06/2019   ALKPHOS 97 03/06/2019   AST 13 03/06/2019   ALT 14 03/06/2019   PROT 6.8 03/06/2019   ALBUMIN 4.1 03/06/2019   CALCIUM 8.8 (L) 06/10/2019   ANIONGAP 9 06/10/2019   Lab Results  Component Value Date   CHOL 179 03/06/2019   Lab Results  Component Value Date   HDL 42 03/06/2019   Lab Results  Component Value Date   LDLCALC 115 (H) 03/06/2019   Lab Results  Component Value Date   TRIG 110 03/06/2019    Lab Results  Component Value Date   CHOLHDL 4.3 03/06/2019   No results found for: HGBA1C    Assessment & Plan:  Nerine was seen today for hypertension and medication refill.  Diagnoses and all orders for this visit:  Class 2 severe obesity due to excess calories with serious comorbidity in adult, unspecified BMI ([place]) Obesity is 30-39 indicating an excess in caloric intake or underlining conditions. This may lead to other co-morbidities examples are shortness of breath, diabetes and CVD Lifestyle modifications of diet and exercise may reduce obesity.   Hypertension, unspecified type Well controlled on losartan 50 mg and HCTZ 25 mg.  low-sodium, DASH diet, medication compliance, 150 minutes of moderate intensity exercise per week. Discussed medication compliance, adverse effects. -     hydrochlorothiazide (HYDRODIURIL) 25 MG tablet; Take 1 tablet (25 mg total) by mouth daily. Take on tablet in the morning. -     Comprehensive metabolic panel  Medication refill -     omeprazole (PRILOSEC) 40 MG capsule; Take 1 capsule (40 mg total) by mouth daily.  Mixed hyperlipidemia cholesterol that can lead to heart attack and stroke. To lower your number you can decrease your fatty foods, red meat, cheese, milk and increase fiber like whole grains and veggies. You can also add a fiber supplement like Metamucil or Benefiber.  -     Lipid Panel -     Comprehensive metabolic panel  Acute deep vein thrombosis (DVT) of other specified vein of left lower extremity ([place]) 1/21/21status post ORIF of the left trimalleolar ankle fracture. Developed a large DVT in her left lower extremity for which she is on anticoagulation.  Gastroesophageal reflux disease without esophagitis Discussed eating small frequent meal, reduction in acidic foods, fried foods ,spicy foods, alcohol caffeine and tobacco and certain medications.  Avoid laying down after eating 78mins-1hour, elevated head of the  bed.    Follow-up: Return for schedule for pap.    Kerin Perna, NP

## 2019-09-20 LAB — CBC WITH DIFFERENTIAL/PLATELET
Basophils Absolute: 0 10*3/uL (ref 0.0–0.2)
Basos: 0 %
EOS (ABSOLUTE): 0.1 10*3/uL (ref 0.0–0.4)
Eos: 1 %
Hematocrit: 33 % — ABNORMAL LOW (ref 34.0–46.6)
Hemoglobin: 10.5 g/dL — ABNORMAL LOW (ref 11.1–15.9)
Immature Grans (Abs): 0 10*3/uL (ref 0.0–0.1)
Immature Granulocytes: 0 %
Lymphocytes Absolute: 2.3 10*3/uL (ref 0.7–3.1)
Lymphs: 31 %
MCH: 25.4 pg — ABNORMAL LOW (ref 26.6–33.0)
MCHC: 31.8 g/dL (ref 31.5–35.7)
MCV: 80 fL (ref 79–97)
Monocytes Absolute: 0.6 10*3/uL (ref 0.1–0.9)
Monocytes: 7 %
Neutrophils Absolute: 4.5 10*3/uL (ref 1.4–7.0)
Neutrophils: 61 %
Platelets: 422 10*3/uL (ref 150–450)
RBC: 4.13 x10E6/uL (ref 3.77–5.28)
RDW: 15.3 % (ref 11.7–15.4)
WBC: 7.4 10*3/uL (ref 3.4–10.8)

## 2019-09-20 LAB — COMPREHENSIVE METABOLIC PANEL
ALT: 10 IU/L (ref 0–32)
AST: 11 IU/L (ref 0–40)
Albumin/Globulin Ratio: 1.6 (ref 1.2–2.2)
Albumin: 4.2 g/dL (ref 3.8–4.8)
Alkaline Phosphatase: 120 IU/L — ABNORMAL HIGH (ref 39–117)
BUN/Creatinine Ratio: 10 (ref 9–23)
BUN: 9 mg/dL (ref 6–24)
Bilirubin Total: 0.2 mg/dL (ref 0.0–1.2)
CO2: 25 mmol/L (ref 20–29)
Calcium: 9.5 mg/dL (ref 8.7–10.2)
Chloride: 105 mmol/L (ref 96–106)
Creatinine, Ser: 0.91 mg/dL (ref 0.57–1.00)
GFR calc Af Amer: 86 mL/min/{1.73_m2} (ref >59)
GFR calc non Af Amer: 75 mL/min/{1.73_m2} (ref >59)
Globulin, Total: 2.7 g/dL (ref 1.5–4.5)
Glucose: 88 mg/dL (ref 65–99)
Potassium: 3.9 mmol/L (ref 3.5–5.2)
Sodium: 140 mmol/L (ref 134–144)
Total Protein: 6.9 g/dL (ref 6.0–8.5)

## 2019-09-20 LAB — LIPID PANEL
Chol/HDL Ratio: 5 ratio — ABNORMAL HIGH (ref 0.0–4.4)
Cholesterol, Total: 190 mg/dL (ref 100–199)
HDL: 38 mg/dL — ABNORMAL LOW (ref >39)
LDL Chol Calc (NIH): 137 mg/dL — ABNORMAL HIGH (ref 0–99)
Triglycerides: 81 mg/dL (ref 0–149)
VLDL Cholesterol Cal: 15 mg/dL (ref 5–40)

## 2019-09-21 DIAGNOSIS — M25672 Stiffness of left ankle, not elsewhere classified: Secondary | ICD-10-CM | POA: Diagnosis not present

## 2019-09-21 DIAGNOSIS — M25572 Pain in left ankle and joints of left foot: Secondary | ICD-10-CM | POA: Diagnosis not present

## 2019-09-21 DIAGNOSIS — M6281 Muscle weakness (generalized): Secondary | ICD-10-CM | POA: Diagnosis not present

## 2019-09-21 DIAGNOSIS — M25675 Stiffness of left foot, not elsewhere classified: Secondary | ICD-10-CM | POA: Diagnosis not present

## 2019-09-21 DIAGNOSIS — R262 Difficulty in walking, not elsewhere classified: Secondary | ICD-10-CM | POA: Diagnosis not present

## 2019-09-22 ENCOUNTER — Other Ambulatory Visit (INDEPENDENT_AMBULATORY_CARE_PROVIDER_SITE_OTHER): Payer: Self-pay | Admitting: Primary Care

## 2019-09-22 MED ORDER — PRAVASTATIN SODIUM 20 MG PO TABS: 20.0000 mg | ORAL_TABLET | Freq: Every day | ORAL | 3 refills | Status: DC

## 2019-09-22 MED ORDER — FERROUS SULFATE 325 (65 FE) MG PO TABS: 325.0000 mg | ORAL_TABLET | Freq: Every day | ORAL | 1 refills | Status: DC

## 2019-10-02 DIAGNOSIS — M25675 Stiffness of left foot, not elsewhere classified: Secondary | ICD-10-CM | POA: Diagnosis not present

## 2019-10-02 DIAGNOSIS — M25572 Pain in left ankle and joints of left foot: Secondary | ICD-10-CM | POA: Diagnosis not present

## 2019-10-02 DIAGNOSIS — M25672 Stiffness of left ankle, not elsewhere classified: Secondary | ICD-10-CM | POA: Diagnosis not present

## 2019-10-02 DIAGNOSIS — R262 Difficulty in walking, not elsewhere classified: Secondary | ICD-10-CM | POA: Diagnosis not present

## 2019-10-02 DIAGNOSIS — M6281 Muscle weakness (generalized): Secondary | ICD-10-CM | POA: Diagnosis not present

## 2019-10-04 ENCOUNTER — Telehealth: Payer: Self-pay

## 2019-10-04 NOTE — Telephone Encounter (Signed)
See my chart message  Patricia Davila

## 2019-10-05 ENCOUNTER — Ambulatory Visit (INDEPENDENT_AMBULATORY_CARE_PROVIDER_SITE_OTHER): Payer: Medicare Other | Admitting: Primary Care

## 2019-10-17 ENCOUNTER — Ambulatory Visit (INDEPENDENT_AMBULATORY_CARE_PROVIDER_SITE_OTHER): Payer: Medicare Other | Admitting: Primary Care

## 2019-10-19 ENCOUNTER — Other Ambulatory Visit: Payer: Self-pay | Admitting: Physician Assistant

## 2019-10-26 ENCOUNTER — Ambulatory Visit (INDEPENDENT_AMBULATORY_CARE_PROVIDER_SITE_OTHER): Payer: Medicare Other | Admitting: Primary Care

## 2019-10-26 ENCOUNTER — Encounter: Payer: Self-pay | Admitting: Orthopaedic Surgery

## 2019-10-30 ENCOUNTER — Other Ambulatory Visit: Payer: Self-pay | Admitting: Physician Assistant

## 2019-10-30 MED ORDER — METHOCARBAMOL 500 MG PO TABS: 500.0000 mg | ORAL_TABLET | Freq: Two times a day (BID) | ORAL | 0 refills | Status: DC | PRN

## 2019-10-30 MED ORDER — TRAMADOL HCL 50 MG PO TABS: 50.0000 mg | ORAL_TABLET | Freq: Two times a day (BID) | ORAL | 0 refills | Status: DC | PRN

## 2019-10-30 NOTE — Telephone Encounter (Signed)
Just sent in

## 2019-11-09 ENCOUNTER — Other Ambulatory Visit (HOSPITAL_COMMUNITY)
Admission: RE | Admit: 2019-11-09 | Discharge: 2019-11-09 | Disposition: A | Discharge status: Home / Self Care | Payer: Medicare Other | Source: Ambulatory Visit | Attending: Primary Care | Admitting: Primary Care

## 2019-11-09 ENCOUNTER — Other Ambulatory Visit: Payer: Self-pay

## 2019-11-09 ENCOUNTER — Ambulatory Visit (INDEPENDENT_AMBULATORY_CARE_PROVIDER_SITE_OTHER): Payer: Medicare Other | Admitting: Primary Care

## 2019-11-09 ENCOUNTER — Encounter (INDEPENDENT_AMBULATORY_CARE_PROVIDER_SITE_OTHER): Payer: Self-pay | Admitting: Primary Care

## 2019-11-09 VITALS — BP 130/87 | HR 71 | Temp 97.3°F | Ht 61.0 in | Wt 212.8 lb

## 2019-11-09 DIAGNOSIS — N898 Other specified noninflammatory disorders of vagina: Secondary | ICD-10-CM | POA: Insufficient documentation

## 2019-11-09 DIAGNOSIS — Z124 Encounter for screening for malignant neoplasm of cervix: Secondary | ICD-10-CM | POA: Insufficient documentation

## 2019-11-09 DIAGNOSIS — Z1231 Encounter for screening mammogram for malignant neoplasm of breast: Secondary | ICD-10-CM | POA: Diagnosis not present

## 2019-11-09 NOTE — Patient Instructions (Signed)

## 2019-11-09 NOTE — Progress Notes (Signed)
Established Patient Office Visit  Subjective:  Patient ID: Patricia Davila, female    DOB: 23-Jan-1971  Age: 49 y.o. MRN: [id number]  CC: No chief complaint on file.   HPI Patricia Davila is a 49 year old African American female presents to day for her gyn exam. She complains of a discharge feels like it is yeast. No other problem or concerns. Patient has stopped Michael [place] and tried to reach out to [facility] regarding breaking out in hives appointment given in July. Advise to take a ASA 81 mg daily and ask for an earlier appointment with history of DVT's.  Past Medical History:  Diagnosis Date  . Anemia    low iron during pregnancy  . Anxiety   . Arthritis   . Asthma    as a child  . Bipolar disorder ([place])   . Depression   . GERD (gastroesophageal reflux disease)   . Headache   . Hypertension   . Left trimalleolar fracture   . Pneumonia    as a child  . PONV (postoperative nausea and vomiting)   . Restless legs     Past Surgical History:  Procedure Laterality Date  . BILATERAL ANTERIOR TOTAL HIP ARTHROPLASTY Bilateral 03/25/2017   Procedure: BILATERAL ANTERIOR TOTAL HIP ARTHROPLASTY;  Surgeon: Leandrew Koyanagi, MD;  Location: [facility];  Service: Orthopedics;  Laterality: Bilateral;  . DENTAL SURGERY     all teeth extracted  . INTRAVASCULAR ULTRASOUND/IVUS N/A 06/09/2019   Procedure: INTRAVASCULAR ULTRASOUND/IVUS;  Surgeon: Elam Dutch, MD;  Location: [facility];  Service: Cardiovascular;  Laterality: N/A;  . LAMINECTOMY AND MICRODISCECTOMY LUMBAR SPINE   06/27/2010  . ORIF ANKLE FRACTURE Left 03/17/2019   Procedure: OPEN REDUCTION INTERNAL FIXATION (ORIF) LEFT ANKLE FRACTURE;  Surgeon: Leandrew Koyanagi, MD;  Location: [place];  Service: Orthopedics;  Laterality: Left;  . PERIPHERAL VASCULAR INTERVENTION Left 06/09/2019   Procedure: PERIPHERAL VASCULAR INTERVENTION;  Surgeon: Elam Dutch, MD;  Location: [facility];  Service:  Cardiovascular;  Laterality: Left;  Common Iliac vein  . PERIPHERAL VASCULAR THROMBECTOMY Left 06/09/2019   Procedure: PERIPHERAL VASCULAR THROMBECTOMY;  Surgeon: Elam Dutch, MD;  Location: [facility];  Service: Cardiovascular;  Laterality: Left;  . TUBAL LIGATION Bilateral     Family History  Problem Relation Age of Onset  . Colon cancer Mother   . HIV Mother   . Colon cancer Sister   . Breast cancer Neg Hx     Social History   Socioeconomic History  . Marital status: Married    Spouse name: Not on file  . Number of children: Not on file  . Years of education: Not on file  . Highest education level: Not on file  Occupational History  . Not on file  Tobacco Use  . Smoking status: Former Smoker    Packs/day: 0.25    Types: Cigarettes  . Smokeless tobacco: Never Used  Substance and Sexual Activity  . Alcohol use: No  . Drug use: No  . Sexual activity: Yes    Birth control/protection: Surgical    Comment: BTL  Other Topics Concern  . Not on file  Social History Narrative  . Not on file   Social Determinants of Health   Financial Resource Strain:   . Difficulty of Paying Living Expenses:   Food Insecurity:   . Worried About Charity fundraiser in the Last Year:   .  Ran Out of Food in the Last Year:   Transportation Needs:   . Film/video editor (Medical):   [name] Lack of Transportation (Non-Medical):   Physical Activity:   . Days of Exercise per Week:   . Minutes of Exercise per Session:   Stress:   . Feeling of Stress :   Social Connections:   . Frequency of Communication with Friends and Family:   . Frequency of Social Gatherings with Friends and Family:   . Attends Religious Services:   . Active Member of Clubs or Organizations:   . Attends Archivist Meetings:   [name] Marital Status:   Intimate Partner Violence:   . Fear of Current or Ex-Partner:   . Emotionally Abused:   [name] Physically Abused:   . Sexually Abused:     Outpatient  Medications Prior to Visit  Medication Sig Dispense Refill  . Cholecalciferol (VITAMIN D3) 50 MCG (2000 UT) TABS TAKE 1 TABLET BY MOUTH EVERY DAY 30 tablet 2  . cyclobenzaprine (FLEXERIL) 5 MG tablet Take 1-2 tablets (5-10 mg total) by mouth 3 (three) times daily as needed for muscle spasms. 30 tablet 3  . ferrous sulfate 325 (65 FE) MG tablet Take 1 tablet (325 mg total) by mouth daily with breakfast. 90 tablet 1  . hydrochlorothiazide (HYDRODIURIL) 25 MG tablet Take 1 tablet (25 mg total) by mouth daily. Take on tablet in the morning. 90 tablet 1  . losartan (COZAAR) 50 MG tablet TAKE 1 TABLET BY MOUTH ONCE DAILY 90 tablet 1  . methocarbamol (ROBAXIN) 500 MG tablet Take 1 tablet (500 mg total) by mouth 2 (two) times daily as needed. 20 tablet 0  . methocarbamol (ROBAXIN) 750 MG tablet Take 1 tablet (750 mg total) by mouth 2 (two) times daily as needed for muscle spasms. 60 tablet 0  . omeprazole (PRILOSEC) 40 MG capsule Take 1 capsule (40 mg total) by mouth daily. 30 capsule 3  . pravastatin (PRAVACHOL) 20 MG tablet Take 1 tablet (20 mg total) by mouth daily. 90 tablet 3  . rivaroxaban (XARELTO) 20 MG TABS tablet Take 1 tablet (20 mg total) by mouth daily with supper. 30 tablet 6  . traMADol (ULTRAM) 50 MG tablet Take 1-2 tabs po tid prn pain 30 tablet 0  . traMADol (ULTRAM) 50 MG tablet Take 1-2 tablets (50-100 mg total) by mouth 2 (two) times daily as needed. 30 tablet 0   No facility-administered medications prior to visit.    Allergies  Allergen Reactions  . Ativan [Lorazepam] Anxiety    HALLUCINATIONS  . Anesthesia S-I-40 [Propofol]     hallucinations  . Tylenol With Codeine #3 [Acetaminophen-Codeine] Hives and Itching    ROS Review of Systems  Genitourinary: Positive for vaginal discharge.  All other systems reviewed and are negative.     Objective:    Physical Exam  CONSTITUTIONAL: Well-developed, well-nourished female in no acute distress.  HENT:  Normocephalic,  atraumatic, External right and left ear normal.  EYES: Conjunctivae and EOM are normal. Pupils are equal, round, and reactive to light. No scleral icterus.  NECK: Normal range of motion, supple, no masses.  Normal thyroid.  SKIN: Skin is warm and dry. No rash noted. Not diaphoretic. No erythema. No pallor. [place]: Alert and oriented to person, place, and time. Normal reflexes, muscle tone coordination. No cranial nerve deficit noted. PSYCHIATRIC: Normal mood and affect. Normal behavior. Normal judgment and thought content. CARDIOVASCULAR: Normal heart rate noted, regular rhythm RESPIRATORY: Clear to auscultation  bilaterally. Effort and breath sounds normal, no problems with respiration noted. BREASTS: Demonstrated SBE and schedule mammogram  ABDOMEN: Soft, normal bowel sounds, no distention noted.  No tenderness, rebound or guarding.  PELVIC: Normal appearing external genitalia; normal appearing vaginal mucosa and cervix.  No abnormal discharge noted.  Pap smear obtained.  Normal uterine size, no other palpable masses, no uterine or adnexal tenderness. MUSCULOSKELETAL: Normal range of motion. No tenderness.  No cyanosis, clubbing, or edema.  2+ distal pulses. BP 130/87 (BP Location: Right Arm, Patient Position: Sitting, Cuff Size: Large)   Pulse 71   Temp (!) 97.3 F (36.3 C) (Temporal)   Ht 5\' 1"  (1.549 m)   Wt 212 lb 12.8 oz (96.5 kg)   LMP 10/04/2019 (Approximate)   SpO2 98%   BMI 40.21 kg/m  Wt Readings from Last 3 Encounters:  11/09/19 212 lb 12.8 oz (96.5 kg)  09/19/19 210 lb 6.4 oz (95.4 kg)  07/06/19 213 lb 3.2 oz (96.7 kg)     Health Maintenance Due  Topic Date Due  . PAP SMEAR-Modifier  Never done    There are no preventive care reminders to display for this patient.  Lab Results  Component Value Date   TSH 0.524 05/19/2018   Lab Results  Component Value Date   WBC 7.4 09/19/2019   HGB 10.5 (L) 09/19/2019   HCT 33.0 (L) 09/19/2019   MCV 80 09/19/2019   PLT  422 09/19/2019   Lab Results  Component Value Date   NA 140 09/19/2019   K 3.9 09/19/2019   CO2 25 09/19/2019   GLUCOSE 88 09/19/2019   BUN 9 09/19/2019   CREATININE 0.91 09/19/2019   BILITOT 0.2 09/19/2019   ALKPHOS 120 (H) 09/19/2019   AST 11 09/19/2019   ALT 10 09/19/2019   PROT 6.9 09/19/2019   ALBUMIN 4.2 09/19/2019   CALCIUM 9.5 09/19/2019   ANIONGAP 9 06/10/2019   Lab Results  Component Value Date   CHOL 190 09/19/2019   Lab Results  Component Value Date   HDL 38 (L) 09/19/2019   Lab Results  Component Value Date   LDLCALC 137 (H) 09/19/2019   Lab Results  Component Value Date   TRIG 81 09/19/2019   Lab Results  Component Value Date   CHOLHDL 5.0 (H) 09/19/2019   No results found for: HGBA1C    Assessment & Plan:  Diagnoses and all orders for this visit:  Vaginal discharge -     Cervicovaginal ancillary only  Screening for cervical cancer -     Cytology - PAP([place])  Encounter for screening mammogram for breast cancer -     [facility]   Follow-up: Return in about 6 months (around 05/11/2020) for in person Bp and labs.    Kerin Perna, NP

## 2019-11-10 ENCOUNTER — Encounter: Payer: Self-pay | Admitting: Orthopaedic Surgery

## 2019-11-10 ENCOUNTER — Encounter (INDEPENDENT_AMBULATORY_CARE_PROVIDER_SITE_OTHER): Payer: Self-pay | Admitting: Primary Care

## 2019-11-10 ENCOUNTER — Other Ambulatory Visit (INDEPENDENT_AMBULATORY_CARE_PROVIDER_SITE_OTHER): Payer: Self-pay | Admitting: Primary Care

## 2019-11-10 LAB — CERVICOVAGINAL ANCILLARY ONLY
Bacterial Vaginitis (gardnerella): NEGATIVE
Candida Glabrata: NEGATIVE
Candida Vaginitis: NEGATIVE
Chlamydia: NEGATIVE
Comment: NEGATIVE
Comment: NEGATIVE
Comment: NEGATIVE
Comment: NEGATIVE
Comment: NEGATIVE
Comment: NORMAL
Neisseria Gonorrhea: NEGATIVE
Trichomonas: NEGATIVE

## 2019-11-10 MED ORDER — HYDROXYZINE PAMOATE 25 MG PO CAPS: 25.0000 mg | ORAL_CAPSULE | Freq: Three times a day (TID) | ORAL | 1 refills | Status: DC | PRN

## 2019-11-13 ENCOUNTER — Other Ambulatory Visit (INDEPENDENT_AMBULATORY_CARE_PROVIDER_SITE_OTHER): Payer: Self-pay | Admitting: Primary Care

## 2019-11-13 LAB — CYTOLOGY - PAP: Diagnosis: NEGATIVE

## 2019-11-13 MED ORDER — SUMATRIPTAN SUCCINATE 25 MG PO TABS: 25.0000 mg | ORAL_TABLET | ORAL | 1 refills | Status: DC | PRN

## 2019-11-13 NOTE — Telephone Encounter (Signed)
Patricia Davila noted when he saw her last that it had healed according to [facility]

## 2019-11-14 ENCOUNTER — Encounter (INDEPENDENT_AMBULATORY_CARE_PROVIDER_SITE_OTHER): Payer: Self-pay | Admitting: Primary Care

## 2019-11-22 ENCOUNTER — Ambulatory Visit: Payer: Medicare Other | Admitting: Orthopaedic Surgery

## 2019-12-13 ENCOUNTER — Other Ambulatory Visit: Payer: Self-pay

## 2019-12-13 ENCOUNTER — Emergency Department (HOSPITAL_COMMUNITY): Payer: Medicare Other

## 2019-12-13 ENCOUNTER — Emergency Department (HOSPITAL_COMMUNITY)
Admission: EM | Admit: 2019-12-13 | Discharge: 2019-12-14 | Disposition: A | Discharge status: Home / Self Care | Payer: Medicare Other | Attending: Emergency Medicine | Admitting: Emergency Medicine

## 2019-12-13 ENCOUNTER — Telehealth: Payer: Self-pay

## 2019-12-13 ENCOUNTER — Encounter (HOSPITAL_COMMUNITY): Payer: Self-pay | Admitting: Emergency Medicine

## 2019-12-13 DIAGNOSIS — Z7901 Long term (current) use of anticoagulants: Secondary | ICD-10-CM | POA: Insufficient documentation

## 2019-12-13 DIAGNOSIS — Z79899 Other long term (current) drug therapy: Secondary | ICD-10-CM | POA: Insufficient documentation

## 2019-12-13 DIAGNOSIS — R0789 Other chest pain: Secondary | ICD-10-CM

## 2019-12-13 DIAGNOSIS — I10 Essential (primary) hypertension: Secondary | ICD-10-CM | POA: Insufficient documentation

## 2019-12-13 DIAGNOSIS — Z87891 Personal history of nicotine dependence: Secondary | ICD-10-CM | POA: Diagnosis not present

## 2019-12-13 DIAGNOSIS — R0602 Shortness of breath: Secondary | ICD-10-CM | POA: Diagnosis not present

## 2019-12-13 DIAGNOSIS — R079 Chest pain, unspecified: Secondary | ICD-10-CM | POA: Diagnosis not present

## 2019-12-13 LAB — BASIC METABOLIC PANEL
Anion gap: 9 (ref 5–15)
BUN: 8 mg/dL (ref 6–20)
CO2: 23 mmol/L (ref 22–32)
Calcium: 8.8 mg/dL — ABNORMAL LOW (ref 8.9–10.3)
Chloride: 105 mmol/L (ref 98–111)
Creatinine, Ser: 0.81 mg/dL (ref 0.44–1.00)
GFR calc Af Amer: >60 mL/min (ref >60)
GFR calc non Af Amer: >60 mL/min (ref >60)
Glucose, Bld: 96 mg/dL (ref 70–99)
Potassium: 3.7 mmol/L (ref 3.5–5.1)
Sodium: 137 mmol/L (ref 135–145)

## 2019-12-13 LAB — CBC
HCT: 40.3 % (ref 36.0–46.0)
Hemoglobin: 12.7 g/dL (ref 12.0–15.0)
MCH: 26.8 pg (ref 26.0–34.0)
MCHC: 31.5 g/dL (ref 30.0–36.0)
MCV: 85 fL (ref 80.0–100.0)
Platelets: 300 10*3/uL (ref 150–400)
RBC: 4.74 MIL/uL (ref 3.87–5.11)
RDW: 19.3 % — ABNORMAL HIGH (ref 11.5–15.5)
WBC: 7.2 10*3/uL (ref 4.0–10.5)
nRBC: 0 % (ref 0.0–0.2)

## 2019-12-13 LAB — TROPONIN I (HIGH SENSITIVITY)
Troponin I (High Sensitivity): 3 ng/L (ref <18)
Troponin I (High Sensitivity): 10 ng/L (ref <18)

## 2019-12-13 LAB — D-DIMER, QUANTITATIVE: D-Dimer, Quant: 0.47 ug/mL-FEU (ref 0.00–0.50)

## 2019-12-13 MED ORDER — HYDROCODONE-ACETAMINOPHEN 5-325 MG PO TABS
1.0000 | ORAL_TABLET | Freq: Once | ORAL | Status: AC
  Administered 2019-12-13: 1 via ORAL
  Filled 2019-12-13: qty 1

## 2019-12-13 MED ORDER — SODIUM CHLORIDE 0.9% FLUSH: 3.0000 mL | Freq: Once | INTRAVENOUS | Status: DC

## 2019-12-13 NOTE — Discharge Instructions (Signed)
Your labs today do not indicate a cardiac (heart) or pulmonary (lung) etiology of your symptoms. Please refer to the attached instructions. Follow-up with your PCP as discussed. You may trial ibuprofen or naproxen for discomfort.

## 2019-12-13 NOTE — ED Triage Notes (Signed)
Pt arrives to ED from home with complaints of chest pain and shortness of breath starting yesterday that has worsened today. Patient states she is also having right knee swelling that started yesterday as well. Patient has hx of PE in October 2020 that she's has been on Xarelto for but she stopped the blood thinner in February due to allergic reaction.

## 2019-12-13 NOTE — ED Provider Notes (Signed)
[facility] EMERGENCY DEPARTMENT Provider Note   CSN: [id number] Arrival date & time: 12/13/19  1147     History Chief Complaint  Patient presents with  . Shortness of Breath  . Chest Pain    Patricia Davila is a 50 y.o. female.  Patient reports chest pain and shortness of breath that started yesterday. Shortness of breath worsened today. She endorses onset of cough today, non-productive. Occasional chills but no fever. Prior history of pulmonary embolus. She is not currently tachycardic.  The history is provided by the patient.  Shortness of Breath Severity:  Moderate Onset quality:  Gradual Duration:  2 days Timing:  Constant Progression:  Worsening Associated symptoms: chest pain   Risk factors: hx of PE/DVT   Chest Pain Associated symptoms: shortness of breath        Past Medical History:  Diagnosis Date  . Anemia    low iron during pregnancy  . Anxiety   . Arthritis   . Asthma    as a child  . Bipolar disorder ([place])   . Depression   . GERD (gastroesophageal reflux disease)   . Headache   . Hypertension   . Left trimalleolar fracture   . Pneumonia    as a child  . PONV (postoperative nausea and vomiting)   . Restless legs     Patient Active Problem List   Diagnosis Date Noted  . Pulmonary embolism ([place]) 06/07/2019  . Left leg DVT ([place]) 06/07/2019  . Displaced trimalleolar fracture of left lower leg, subsequent encounter for closed fracture with delayed healing 03/17/2019  . Primary osteoarthritis of right hip 04/08/2017  . History of hip replacement 03/25/2017  . Primary osteoarthritis of left hip 12/22/2016  . Major depressive disorder 10/24/2012  . Smokes tobacco daily 05/13/2010  . Headache(784.0) 05/13/2010  . Microalbuminuria 04/16/2009  . GERD 11/22/2008  . Left hip pain 11/22/2008  . Bipolar 1 disorder, depressed ([place]) 05/17/2007  . Lumbosacral degenerative disk disease 03/23/2005  . Essential hypertension, benign  08/18/2003    Past Surgical History:  Procedure Laterality Date  . BILATERAL ANTERIOR TOTAL HIP ARTHROPLASTY Bilateral 03/25/2017   Procedure: BILATERAL ANTERIOR TOTAL HIP ARTHROPLASTY;  Surgeon: Leandrew Koyanagi, MD;  Location: [place] [facility];  Service: Orthopedics;  Laterality: Bilateral;  . DENTAL SURGERY     all teeth extracted  . INTRAVASCULAR ULTRASOUND/IVUS N/A 06/09/2019   Procedure: INTRAVASCULAR ULTRASOUND/IVUS;  Surgeon: Elam Dutch, MD;  Location: [place] [facility];  Service: Cardiovascular;  Laterality: N/A;  . LAMINECTOMY AND MICRODISCECTOMY LUMBAR SPINE   06/27/2010  . ORIF ANKLE FRACTURE Left 03/17/2019   Procedure: OPEN REDUCTION INTERNAL FIXATION (ORIF) LEFT ANKLE FRACTURE;  Surgeon: Leandrew Koyanagi, MD;  Location: [place];  Service: Orthopedics;  Laterality: Left;  . PERIPHERAL VASCULAR INTERVENTION Left 06/09/2019   Procedure: PERIPHERAL VASCULAR INTERVENTION;  Surgeon: Elam Dutch, MD;  Location: [place] [facility];  Service: Cardiovascular;  Laterality: Left;  Common Iliac vein  . PERIPHERAL VASCULAR THROMBECTOMY Left 06/09/2019   Procedure: PERIPHERAL VASCULAR THROMBECTOMY;  Surgeon: Elam Dutch, MD;  Location: [place] [facility];  Service: Cardiovascular;  Laterality: Left;  . TUBAL LIGATION Bilateral      OB History   No obstetric history on file.     Family History  Problem Relation Age of Onset  . Colon cancer Mother   . HIV Mother   . Colon cancer Sister   . Breast cancer Neg Hx  Social History   Tobacco Use  . Smoking status: Former Smoker    Packs/day: 0.25    Types: Cigarettes  . Smokeless tobacco: Never Used  Substance Use Topics  . Alcohol use: No  . Drug use: No    Home Medications Prior to Admission medications   Medication Sig Start Date End Date Taking? Authorizing Provider  Cholecalciferol (VITAMIN D3) 50 MCG (2000 UT) TABS TAKE 1 TABLET BY MOUTH EVERY DAY 08/14/19   Kerin Perna, NP  cyclobenzaprine  (FLEXERIL) 5 MG tablet Take 1-2 tablets (5-10 mg total) by mouth 3 (three) times daily as needed for muscle spasms. 05/02/19   Aundra Dubin, PA-C  ferrous sulfate 325 (65 FE) MG tablet Take 1 tablet (325 mg total) by mouth daily with breakfast. 09/22/19   Kerin Perna, NP  hydrochlorothiazide (HYDRODIURIL) 25 MG tablet Take 1 tablet (25 mg total) by mouth daily. Take on tablet in the morning. 09/19/19   Kerin Perna, NP  hydrOXYzine (VISTARIL) 25 MG capsule Take 1 capsule (25 mg total) by mouth 3 (three) times daily as needed. 11/10/19   Kerin Perna, NP  losartan (COZAAR) 50 MG tablet TAKE 1 TABLET BY MOUTH ONCE DAILY 09/19/19   Kerin Perna, NP  methocarbamol (ROBAXIN) 500 MG tablet Take 1 tablet (500 mg total) by mouth 2 (two) times daily as needed. 10/30/19   Aundra Dubin, PA-C  methocarbamol (ROBAXIN) 750 MG tablet Take 1 tablet (750 mg total) by mouth 2 (two) times daily as needed for muscle spasms. 09/15/19   Aundra Dubin, PA-C  omeprazole (PRILOSEC) 40 MG capsule Take 1 capsule (40 mg total) by mouth daily. 09/19/19   Kerin Perna, NP  pravastatin (PRAVACHOL) 20 MG tablet Take 1 tablet (20 mg total) by mouth daily. 09/22/19   Kerin Perna, NP  rivaroxaban (XARELTO) 20 MG TABS tablet Take 1 tablet (20 mg total) by mouth daily with supper. 07/07/19   Elam Dutch, MD  SUMAtriptan (IMITREX) 25 MG tablet Take 1 tablet (25 mg total) by mouth every 2 (two) hours as needed for migraine. May repeat in 2 hours if headache persists or recurs. 11/13/19   Kerin Perna, NP  traMADol Veatrice Bourbon) 50 MG tablet Take 1-2 tabs po tid prn pain 09/15/19   Aundra Dubin, PA-C  traMADol (ULTRAM) 50 MG tablet Take 1-2 tablets (50-100 mg total) by mouth 2 (two) times daily as needed. 10/30/19   Aundra Dubin, PA-C    Allergies    Ativan [lorazepam], Anesthesia s-i-40 [propofol], and Tylenol with codeine #3 [acetaminophen-codeine]  Review of Systems   Review of  Systems  Respiratory: Positive for shortness of breath.   Cardiovascular: Positive for chest pain.  All other systems reviewed and are negative.   Physical Exam Updated Vital Signs BP (!) 150/95   Pulse 62   Temp 98.5 F (36.9 C)   Resp 20   Ht 5\' 1"  (1.549 m)   Wt 88.5 kg   LMP  (Within Weeks)   SpO2 100%   BMI 36.84 kg/m   .vitals Physical Exam Vitals and nursing note reviewed.  Constitutional:      Appearance: She is well-developed. She is not toxic-appearing.  HENT:     Mouth/Throat:     Mouth: Mucous membranes are moist.  Cardiovascular:     Rate and Rhythm: Normal rate and regular rhythm.  Pulmonary:     Effort: Pulmonary effort is normal.  Breath sounds: Normal breath sounds.  Chest:     Chest wall: No tenderness.  Abdominal:     Palpations: Abdomen is soft.  Musculoskeletal:     Right lower leg: No tenderness. Edema present.     Left lower leg: No tenderness. No edema.     Comments: Minimal swelling of right knee. No ankle edema. Negative homans.   Skin:    General: Skin is warm and dry.  Neurological:     Mental Status: She is alert and oriented to person, place, and time.  Psychiatric:        Mood and Affect: Mood normal.        Behavior: Behavior normal.     ED Results / Procedures / Treatments   Labs (all labs ordered are listed, but only abnormal results are displayed) Labs Reviewed  BASIC METABOLIC PANEL - Abnormal; Notable for the following components:      Result Value   Calcium 8.8 (*)    All other components within normal limits  CBC - Abnormal; Notable for the following components:   RDW 19.3 (*)    All other components within normal limits  D-DIMER, QUANTITATIVE (NOT AT [facility])  TROPONIN I (HIGH SENSITIVITY)  TROPONIN I (HIGH SENSITIVITY)    EKG EKG Interpretation  Date/Time:  Wednesday December 13 2019 11:53:17 EDT Ventricular Rate:  61 PR Interval:  186 QRS Duration: 80 QT Interval:  456 QTC Calculation: 459 R  Axis:   40 Text Interpretation: Normal sinus rhythm Normal ECG No STEMI Confirmed by Nanda Quinton [phone]) on 12/13/2019 11:24:13 PM   Radiology DG Chest 2 View  Result Date: 12/13/2019 CLINICAL DATA:  Chest pain EXAM: CHEST - 2 VIEW COMPARISON:  05/18/2019 FINDINGS: The heart size and mediastinal contours are within normal limits. Both lungs are clear. The visualized skeletal structures are unremarkable. IMPRESSION: No active cardiopulmonary disease. Electronically Signed   By: Davina Poke D.O.   On: 12/13/2019 14:07    Procedures Procedures (including critical care time)  Medications Ordered in ED Medications  sodium chloride flush (NS) 0.9 % injection 3 mL (has no administration in time range)  HYDROcodone-acetaminophen (NORCO/VICODIN) 5-325 MG per tablet 1 tablet (1 tablet Oral Given 12/13/19 2300)    ED Course  I have reviewed the triage vital signs and the nursing notes.  Pertinent labs & imaging results that were available during my care of the patient were reviewed by me and considered in my medical decision making (see chart for details).    MDM Rules/Calculators/A&P                     Patient is to be discharged with recommendation to follow up with PCP in regards to today's hospital visit. Chest pain and shortness of breath is not likely of cardiac or pulmonary etiology d/t presentation, VSS, no tracheal deviation, no JVD or new murmur, RRR, breath sounds equal bilaterally, EKG without acute abnormalities, negative troponin, normal d-dimer and negative CXR. Pt has been advised to return to the ED if CP becomes exertional, associated with diaphoresis or nausea, radiates to left jaw/arm, worsens or becomes concerning in any way. Pt appears reliable for follow up and is agreeable to discharge.   Case has been discussed with Dr. Laverta Baltimore who agrees with the above plan to discharge.   Final Clinical Impression(s) / ED Diagnoses Final diagnoses:  Shortness of breath  Other chest  pain    Rx / DC Orders ED Discharge Orders  None       Etta Quill, NP 12/13/19 2357    Margette Fast, MD 12/16/19 [phone]

## 2019-12-13 NOTE — Telephone Encounter (Signed)
Pt called with c/o chest tightness, sob and R knee swelling since last night. These are the same symptoms she experienced when she had a previous blood clot. Advised pt to report to ED. Pt verbalized understanding. Will call us back if she needs any further assistance.

## 2019-12-14 NOTE — ED Notes (Signed)
Patient verbalizes understanding of discharge instructions. Opportunity for questioning and answers were provided. Armband removed by staff, pt discharged from ED.  

## 2020-01-25 DIAGNOSIS — R05 Cough: Secondary | ICD-10-CM | POA: Diagnosis not present

## 2020-01-25 DIAGNOSIS — R0602 Shortness of breath: Secondary | ICD-10-CM | POA: Diagnosis not present

## 2020-03-01 ENCOUNTER — Other Ambulatory Visit: Payer: Self-pay | Admitting: Orthopaedic Surgery

## 2020-03-01 ENCOUNTER — Other Ambulatory Visit (INDEPENDENT_AMBULATORY_CARE_PROVIDER_SITE_OTHER): Payer: Self-pay | Admitting: Primary Care

## 2020-03-01 DIAGNOSIS — I10 Essential (primary) hypertension: Secondary | ICD-10-CM

## 2020-04-05 ENCOUNTER — Ambulatory Visit: Payer: Medicare Other | Admitting: Orthopaedic Surgery

## 2020-04-09 ENCOUNTER — Ambulatory Visit: Payer: Medicare Other | Admitting: Orthopaedic Surgery

## 2020-05-01 ENCOUNTER — Other Ambulatory Visit (INDEPENDENT_AMBULATORY_CARE_PROVIDER_SITE_OTHER): Payer: Self-pay | Admitting: Primary Care

## 2020-05-13 ENCOUNTER — Ambulatory Visit (INDEPENDENT_AMBULATORY_CARE_PROVIDER_SITE_OTHER): Payer: Medicare Other | Admitting: Primary Care

## 2020-05-17 ENCOUNTER — Telehealth: Payer: Self-pay | Admitting: Orthopaedic Surgery

## 2020-05-17 ENCOUNTER — Telehealth: Payer: Self-pay

## 2020-05-17 NOTE — Telephone Encounter (Signed)
Called patient left message to return call to schedule an appointment for ankle pain and swelling per my chart message.   Appointment with Dr Erlinda Hong

## 2020-05-17 NOTE — Telephone Encounter (Signed)
Patient left VM about Xarelto recently giving her hives.She has been taking > a year.  She went to PCP and they told her to take ASA until she could see [place]. Patient needs f/u appt with PA and rt caval iliac duplex. Left VM

## 2020-05-23 ENCOUNTER — Ambulatory Visit (INDEPENDENT_AMBULATORY_CARE_PROVIDER_SITE_OTHER): Payer: Medicare Other | Admitting: Primary Care

## 2020-05-23 ENCOUNTER — Encounter (INDEPENDENT_AMBULATORY_CARE_PROVIDER_SITE_OTHER): Payer: Self-pay | Admitting: Primary Care

## 2020-05-23 ENCOUNTER — Other Ambulatory Visit: Payer: Self-pay

## 2020-05-23 VITALS — BP 143/86 | HR 85 | Temp 97.3°F | Ht 61.0 in | Wt 211.8 lb

## 2020-05-23 DIAGNOSIS — E559 Vitamin D deficiency, unspecified: Secondary | ICD-10-CM | POA: Diagnosis not present

## 2020-05-23 DIAGNOSIS — I10 Essential (primary) hypertension: Secondary | ICD-10-CM | POA: Diagnosis not present

## 2020-05-23 DIAGNOSIS — Z1159 Encounter for screening for other viral diseases: Secondary | ICD-10-CM

## 2020-05-23 DIAGNOSIS — E782 Mixed hyperlipidemia: Secondary | ICD-10-CM | POA: Diagnosis not present

## 2020-05-23 DIAGNOSIS — D649 Anemia, unspecified: Secondary | ICD-10-CM | POA: Diagnosis not present

## 2020-05-23 DIAGNOSIS — Z760 Encounter for issue of repeat prescription: Secondary | ICD-10-CM

## 2020-05-23 DIAGNOSIS — Z833 Family history of diabetes mellitus: Secondary | ICD-10-CM

## 2020-05-23 LAB — POCT GLYCOSYLATED HEMOGLOBIN (HGB A1C): Hemoglobin A1C: 5.1 % (ref 4.0–5.6)

## 2020-05-23 MED ORDER — HYDROCHLOROTHIAZIDE 25 MG PO TABS: ORAL_TABLET | ORAL | 1 refills | Status: DC

## 2020-05-23 MED ORDER — AMLODIPINE BESYLATE 10 MG PO TABS: 10.0000 mg | ORAL_TABLET | Freq: Every day | ORAL | 3 refills | Status: DC

## 2020-05-23 MED ORDER — LOSARTAN POTASSIUM 50 MG PO TABS: 50.0000 mg | ORAL_TABLET | Freq: Every day | ORAL | 1 refills | Status: DC

## 2020-05-23 NOTE — Patient Instructions (Signed)
Natural Treatment Black cohosh  Perimenopause  Perimenopause is the normal time of life before and after menstrual periods stop completely (menopause). Perimenopause can begin 2-8 years before menopause, and it usually lasts for 1 year after menopause. During perimenopause, the ovaries may or may not produce an egg. What are the causes? This condition is caused by a natural change in hormone levels that happens as you get older. What increases the risk? This condition is more likely to start at an earlier age if you have certain medical conditions or treatments, including:  A tumor of the pituitary gland in the brain.  A disease that affects the ovaries and hormone production.  Radiation treatment for cancer.  Certain cancer treatments, such as chemotherapy or hormone (anti-estrogen) therapy.  Heavy smoking and excessive alcohol use.  Family history of early menopause. What are the signs or symptoms? Perimenopausal changes affect each woman differently. Symptoms of this condition may include:  Hot flashes.  Night sweats.  Irregular menstrual periods.  Decreased sex drive.  Vaginal dryness.  Headaches.  Mood swings.  Depression.  Memory problems or trouble concentrating.  Irritability.  Tiredness.  Weight gain.  Anxiety.  Trouble getting pregnant. How is this diagnosed? This condition is diagnosed based on your medical history, a physical exam, your age, your menstrual history, and your symptoms. Hormone tests may also be done. How is this treated? In some cases, no treatment is needed. You and your health care provider should make a decision together about whether treatment is necessary. Treatment will be based on your individual condition and preferences. Various treatments are available, such as:  Menopausal hormone therapy (MHT).  Medicines to treat specific symptoms.  Acupuncture.  Vitamin or herbal supplements. Before starting treatment, make sure to  let your health care provider know if you have a personal or family history of:  Heart disease.  Breast cancer.  Blood clots.  Diabetes.  Osteoporosis. Follow these instructions at home: Lifestyle  Do not use any products that contain nicotine or tobacco, such as cigarettes and e-cigarettes. If you need help quitting, ask your health care provider.  Eat a balanced diet that includes fresh fruits and vegetables, whole grains, soybeans, eggs, lean meat, and low-fat dairy.  Get at least 30 minutes of physical activity on 5 or more days each week.  Avoid alcoholic and caffeinated beverages, as well as spicy foods. This may help prevent hot flashes.  Get 7-8 hours of sleep each night.  Dress in layers that can be removed to help you manage hot flashes.  Find ways to manage stress, such as deep breathing, meditation, or journaling. General instructions  Keep track of your menstrual periods, including: ? When they occur. ? How heavy they are and how long they last. ? How much time passes between periods.  Keep track of your symptoms, noting when they start, how often you have them, and how long they last.  Take over-the-counter and prescription medicines only as told by your health care provider.  Take vitamin supplements only as told by your health care provider. These may include calcium, vitamin E, and vitamin D.  Use vaginal lubricants or moisturizers to help with vaginal dryness and improve comfort during sex.  Talk with your health care provider before starting any herbal supplements.  Keep all follow-up visits as told by your health care provider. This is important. This includes any group therapy or counseling. Contact a health care provider if:  You have heavy vaginal bleeding or pass  blood clots.  Your period lasts more than 2 days longer than normal.  Your periods are recurring sooner than 21 days.  You bleed after having sex. Get help right away if:  You  have chest pain, trouble breathing, or trouble talking.  You have severe depression.  You have pain when you urinate.  You have severe headaches.  You have vision problems. Summary  Perimenopause is the time when a woman's body begins to move into menopause. This may happen naturally or as a result of other health problems or medical treatments.  Perimenopause can begin 2-8 years before menopause, and it usually lasts for 1 year after menopause.  Perimenopausal symptoms can be managed through medicines, lifestyle changes, and complementary therapies such as acupuncture. This information is not intended to replace advice given to you by your health care provider. Make sure you discuss any questions you have with your health care provider. Document Revised: 07/16/2017 Document Reviewed: 09/08/2016 Elsevier Patient Education  2020 [employer].  Menopause Menopause is the normal time of life when menstrual periods stop completely. It is usually confirmed by 12 months without a menstrual period. The transition to menopause (perimenopause) most often happens between the ages of 6 and 41. During perimenopause, hormone levels change in your body, which can cause symptoms and affect your health. Menopause may increase your risk for:  Loss of bone (osteoporosis), which causes bone breaks (fractures).  Depression.  Hardening and narrowing of the arteries (atherosclerosis), which can cause heart attacks and strokes. What are the causes? This condition is usually caused by a natural change in hormone levels that happens as you get older. The condition may also be caused by surgery to remove both ovaries (bilateral oophorectomy). What increases the risk? This condition is more likely to start at an earlier age if you have certain medical conditions or treatments, including:  A tumor of the pituitary gland in the brain.  A disease that affects the ovaries and hormone production.  Radiation  Treatment for cancer.  Certain cancer treatments, such as chemotherapy or hormone (anti-estrogen) therapy.  Heavy smoking and excessive alcohol use.  Family history of early menopause. This condition is also more likely to develop earlier in women who are very thin. What are the signs or symptoms? Symptoms of this condition include:  Hot flashes.  Irregular menstrual periods.  Night sweats.  Changes in feelings about sex. This could be a decrease in sex drive or an increased comfort around your sexuality.  Vaginal dryness and thinning of the vaginal walls. This may cause painful intercourse.  Dryness of the skin and development of wrinkles.  Headaches.  Problems sleeping (insomnia).  Mood swings or irritability.  Memory problems.  Weight gain.  Hair growth on the face and chest.  Bladder infections or problems with urinating. How is this diagnosed? This condition is diagnosed based on your medical history, a physical exam, your age, your menstrual history, and your symptoms. Hormone tests may also be done. How is this treated? In some cases, no treatment is needed. You and your health care provider should make a decision together about whether treatment is necessary. Treatment will be based on your individual condition and preferences. Treatment for this condition focuses on managing symptoms. Treatment may include:  Menopausal hormone therapy (MHT).  Medicines to treat specific symptoms or complications.  Acupuncture.  Vitamin or herbal supplements. Before starting treatment, make sure to let your health care provider know if you have a personal or family history  of:  Heart disease.  Breast cancer.  Blood clots.  Diabetes.  Osteoporosis. Follow these instructions at home: Lifestyle  Do not use any products that contain nicotine or tobacco, such as cigarettes and e-cigarettes. If you need help quitting, ask your health care provider.  Get at least 30  minutes of physical activity on 5 or more days each week.  Avoid alcoholic and caffeinated beverages, as well as spicy foods. This may help prevent hot flashes.  Get 7-8 hours of sleep each night.  If you have hot flashes, try: ? Dressing in layers. ? Avoiding things that may trigger hot flashes, such as spicy food, warm places, or stress. ? Taking slow, deep breaths when a hot flash starts. ? Keeping a fan in your home and office.  Find ways to manage stress, such as deep breathing, meditation, or journaling.  Consider going to group therapy with other women who are having menopause symptoms. Ask your health care provider about recommended group therapy meetings. Eating and drinking  Eat a healthy, balanced diet that contains whole grains, lean protein, low-fat dairy, and plenty of fruits and vegetables.  Your health care provider may recommend adding more soy to your diet. Foods that contain soy include tofu, tempeh, and soy milk.  Eat plenty of foods that contain calcium and vitamin D for bone health. Items that are rich in calcium include low-fat milk, yogurt, beans, almonds, sardines, broccoli, and kale. Medicines  Take over-the-counter and prescription medicines only as told by your health care provider.  Talk with your health care provider before starting any herbal supplements. If prescribed, take vitamins and supplements as told by your health care provider. These may include: ? Calcium. Women age 43 and older should get 1,200 mg (milligrams) of calcium every day. ? Vitamin D. Women need 600-800 International Units of vitamin D each day. ? Vitamins B12 and B6. Aim for 50 micrograms of B12 and 1.5 mg of B6 each day. General instructions  Keep track of your menstrual periods, including: ? When they occur. ? How heavy they are and how long they last. ? How much time passes between periods.  Keep track of your symptoms, noting when they start, how often you have them, and  how long they last.  Use vaginal lubricants or moisturizers to help with vaginal dryness and improve comfort during sex.  Keep all follow-up visits as told by your health care provider. This is important. This includes any group therapy or counseling. Contact a health care provider if:  You are still having menstrual periods after age 25.  You have pain during sex.  You have not had a period for 12 months and you develop vaginal bleeding. Get help right away if:  You have: ? Severe depression. ? Excessive vaginal bleeding. ? Pain when you urinate. ? A fast or irregular heart beat (palpitations). ? Severe headaches. ? Abdomen (abdominal) pain or severe indigestion.  You fell and you think you have a broken bone.  You develop leg or chest pain.  You develop vision problems.  You feel a lump in your breast. Summary  Menopause is the normal time of life when menstrual periods stop completely. It is usually confirmed by 12 months without a menstrual period.  The transition to menopause (perimenopause) most often happens between the ages of 71 and 44.  Symptoms can be managed through medicines, lifestyle changes, and complementary therapies such as acupuncture.  Eat a balanced diet that is rich in nutrients to  promote bone health and heart health and to manage symptoms during menopause. This information is not intended to replace advice given to you by your health care provider. Make sure you discuss any questions you have with your health care provider. Document Revised: 07/16/2017 Document Reviewed: 09/05/2016 Elsevier Patient Education  2020 [employer].

## 2020-05-23 NOTE — Progress Notes (Signed)
Established Patient Office Visit  Subjective:  Patient ID: Patricia Davila, female    DOB: [birth year]  Age: 49 y.o. MRN: [id number]  CC:  Chief Complaint  Patient presents with  . Blood Pressure Check  . Hot Flashes    believes she is going through menopause    HPI Patricia Davila is a 49 year old female who presents for management of high blood - Denies shortness of breath, headaches, chest pain or lower extremity edema. She is concern that she is going through menopause "Hot flashes is killing her"  Past Medical History:  Diagnosis Date  . Anemia    low iron during pregnancy  . Anxiety   . Arthritis   . Asthma    as a child  . Bipolar disorder ([place])   . Depression   . GERD (gastroesophageal reflux disease)   . Headache   . Hypertension   . Left trimalleolar fracture   . Pneumonia    as a child  . PONV (postoperative nausea and vomiting)   . Restless legs     Past Surgical History:  Procedure Laterality Date  . BILATERAL ANTERIOR TOTAL HIP ARTHROPLASTY Bilateral 03/25/2017   Procedure: BILATERAL ANTERIOR TOTAL HIP ARTHROPLASTY;  Surgeon: Leandrew Koyanagi, MD;  Location: [facility];  Service: Orthopedics;  Laterality: Bilateral;  . DENTAL SURGERY     all teeth extracted  . INTRAVASCULAR ULTRASOUND/IVUS N/A 06/09/2019   Procedure: INTRAVASCULAR ULTRASOUND/IVUS;  Surgeon: Elam Dutch, MD;  Location: [facility];  Service: Cardiovascular;  Laterality: N/A;  . LAMINECTOMY AND MICRODISCECTOMY LUMBAR SPINE   06/27/2010  . ORIF ANKLE FRACTURE Left 03/17/2019   Procedure: OPEN REDUCTION INTERNAL FIXATION (ORIF) LEFT ANKLE FRACTURE;  Surgeon: Leandrew Koyanagi, MD;  Location: [facility];  Service: Orthopedics;  Laterality: Left;  . PERIPHERAL VASCULAR INTERVENTION Left 06/09/2019   Procedure: PERIPHERAL VASCULAR INTERVENTION;  Surgeon: Elam Dutch, MD;  Location: [facility];  Service: Cardiovascular;  Laterality: Left;  Common Iliac vein  .  PERIPHERAL VASCULAR THROMBECTOMY Left 06/09/2019   Procedure: PERIPHERAL VASCULAR THROMBECTOMY;  Surgeon: Elam Dutch, MD;  Location: [facility];  Service: Cardiovascular;  Laterality: Left;  . TUBAL LIGATION Bilateral     Family History  Problem Relation Age of Onset  . Colon cancer Mother   . HIV Mother   . Colon cancer Sister   . Breast cancer Neg Hx     Social History   Socioeconomic History  . Marital status: Married    Spouse name: Not on file  . Number of children: Not on file  . Years of education: Not on file  . Highest education level: Not on file  Occupational History  . Not on file  Tobacco Use  . Smoking status: Former Smoker    Packs/day: 0.25    Types: Cigarettes  . Smokeless tobacco: Never Used  Vaping Use  . Vaping Use: Never used  Substance and Sexual Activity  . Alcohol use: No  . Drug use: No  . Sexual activity: Yes    Birth control/protection: Surgical    Comment: BTL  Other Topics Concern  . Not on file  Social History Narrative  . Not on file   Social Determinants of Health   Financial Resource Strain:   . Difficulty of Paying Living Expenses: Not on file  Food Insecurity:   . Worried About Charity fundraiser in the Last Year: Not on  file  . [place] in the Last Year: Not on file  Transportation Needs:   . Lack of Transportation (Medical): Not on file  . Lack of Transportation (Non-Medical): Not on file  Physical Activity:   . Days of Exercise per Week: Not on file  . Minutes of Exercise per Session: Not on file  Stress:   . Feeling of Stress : Not on file  Social Connections:   . Frequency of Communication with Friends and Family: Not on file  . Frequency of Social Gatherings with Friends and Family: Not on file  . Attends Religious Services: Not on file  . Active Member of Clubs or Organizations: Not on file  . Attends Archivist Meetings: Not on file  . Marital Status: Not on file  Intimate  Partner Violence:   . Fear of Current or Ex-Partner: Not on file  . Emotionally Abused: Not on file  . Physically Abused: Not on file  . Sexually Abused: Not on file    Outpatient Medications Prior to Visit  Medication Sig Dispense Refill  . cyclobenzaprine (FLEXERIL) 5 MG tablet TAKE 1 TO 2 TABLETS(5 TO 10 MG) BY MOUTH THREE TIMES DAILY AS NEEDED FOR MUSCLE SPASMS 30 tablet 3  . hydrOXYzine (VISTARIL) 25 MG capsule TAKE 1 CAPSULE(25 MG) BY MOUTH THREE TIMES DAILY AS NEEDED 60 capsule 1  . omeprazole (PRILOSEC) 40 MG capsule Take 1 capsule (40 mg total) by mouth daily. 30 capsule 3  . SUMAtriptan (IMITREX) 25 MG tablet TAKE 1 TABLET BY MOUTH EVERY 2 HOURS AS NEEDED FOR MIGRAINE,MAY REPEAT IN 2 HOURS IF NEEDED 10 tablet 1  . hydrochlorothiazide (HYDRODIURIL) 25 MG tablet TAKE 1 TABLET(25 MG) BY MOUTH DAILY IN THE MORNING 90 tablet 1  . losartan (COZAAR) 50 MG tablet TAKE 1 TABLET BY MOUTH ONCE DAILY 90 tablet 1  . Cholecalciferol (VITAMIN D3) 50 MCG (2000 UT) TABS TAKE 1 TABLET BY MOUTH EVERY DAY (Patient taking differently: Take 2,000 Units by mouth daily. ) 30 tablet 2  . ferrous sulfate 325 (65 FE) MG tablet Take 1 tablet (325 mg total) by mouth daily with breakfast. 90 tablet 1  . methocarbamol (ROBAXIN) 500 MG tablet Take 1 tablet (500 mg total) by mouth 2 (two) times daily as needed. (Patient not taking: Reported on 12/13/2019) 20 tablet 0  . methocarbamol (ROBAXIN) 750 MG tablet Take 1 tablet (750 mg total) by mouth 2 (two) times daily as needed for muscle spasms. (Patient not taking: Reported on 12/13/2019) 60 tablet 0  . pravastatin (PRAVACHOL) 20 MG tablet Take 1 tablet (20 mg total) by mouth daily. (Patient not taking: Reported on 12/13/2019) 90 tablet 3  . rivaroxaban (XARELTO) 20 MG TABS tablet Take 1 tablet (20 mg total) by mouth daily with supper. (Patient not taking: Reported on 12/13/2019) 30 tablet 6  . traMADol (ULTRAM) 50 MG tablet Take 1-2 tabs po tid prn pain (Patient not  taking: Reported on 12/13/2019) 30 tablet 0  . traMADol (ULTRAM) 50 MG tablet Take 1-2 tablets (50-100 mg total) by mouth 2 (two) times daily as needed. (Patient not taking: Reported on 12/13/2019) 30 tablet 0   No facility-administered medications prior to visit.    Allergies  Allergen Reactions  . Ativan [Lorazepam] Anxiety    HALLUCINATIONS  . Anesthesia S-I-40 [Propofol]     hallucinations  . Tylenol With Codeine #3 [Acetaminophen-Codeine] Hives and Itching    ROS Review of Systems  Endocrine: Positive for polydipsia and  polyuria.  Genitourinary: Positive for frequency.  Neurological: Positive for headaches.       Migraines   All other systems reviewed and are negative.     Objective:    Physical Exam Vitals reviewed.  Constitutional:      Appearance: She is obese.  HENT:     Head: Normocephalic.     Right Ear: Tympanic membrane normal.     Left Ear: Tympanic membrane normal.  Eyes:     Extraocular Movements: Extraocular movements intact.     Pupils: Pupils are equal, round, and reactive to light.  Cardiovascular:     Rate and Rhythm: Normal rate and regular rhythm.  Pulmonary:     Effort: Pulmonary effort is normal.     Breath sounds: Normal breath sounds.  Abdominal:     General: Bowel sounds are normal.     Palpations: Abdomen is soft.  Musculoskeletal:        General: Normal range of motion.     Cervical back: Normal range of motion and neck supple.  Skin:    General: Skin is warm and dry.  Neurological:     Mental Status: She is alert and oriented to person, place, and time.  Psychiatric:        Mood and Affect: Mood normal.        Behavior: Behavior normal.        Thought Content: Thought content normal.        Judgment: Judgment normal.     BP (!) 143/86 (BP Location: Right Arm, Patient Position: Sitting, Cuff Size: Large)   Pulse 85   Temp (!) 97.3 F (36.3 C) (Temporal)   Ht _0  (1.549 m)   Wt 211 lb 12.8 oz (96.1 kg)   LMP 04/02/2020  (Approximate)   SpO2 97%   BMI 40.02 kg/m  Wt Readings from Last 3 Encounters:  05/23/20 211 lb 12.8 oz (96.1 kg)  12/13/19 195 lb (88.5 kg)  11/09/19 212 lb 12.8 oz (96.5 kg)     Health Maintenance Due  Topic Date Due  . COVID-19 Vaccine (1) Never done  . INFLUENZA VACCINE  03/17/2020    There are no preventive care reminders to display for this patient.  Lab Results  Component Value Date   TSH 0.524 05/19/2018   Lab Results  Component Value Date   WBC 7.5 05/23/2020   HGB 12.7 05/23/2020   HCT 37.8 05/23/2020   MCV 87 05/23/2020   PLT 277 05/23/2020   Lab Results  Component Value Date   NA 142 05/23/2020   K 4.3 05/23/2020   CO2 25 05/23/2020   GLUCOSE 109 (H) 05/23/2020   BUN 10 05/23/2020   CREATININE 0.68 05/23/2020   BILITOT 0.3 05/23/2020   ALKPHOS 144 (H) 05/23/2020   AST 39 05/23/2020   ALT 70 (H) 05/23/2020   PROT 6.6 05/23/2020   ALBUMIN 3.9 05/23/2020   CALCIUM 9.4 05/23/2020   ANIONGAP 9 12/13/2019   Lab Results  Component Value Date   CHOL 175 05/23/2020   Lab Results  Component Value Date   HDL 42 05/23/2020   Lab Results  Component Value Date   LDLCALC 116 (H) 05/23/2020   Lab Results  Component Value Date   TRIG 91 05/23/2020   Lab Results  Component Value Date   CHOLHDL 4.2 05/23/2020   Lab Results  Component Value Date   HGBA1C 5.1 05/23/2020      Assessment & Plan:  Areli was seen  today for blood pressure check and hot flashes.  Diagnoses and all orders for this visit:  Hypertension, unspecified type Blood pressure remains elevated not at goal 130/80 add a calcium channel blocker amlodipine 10 mg daily continue HCTZ 25 mg daily and losartan 50 mg daily. Monitor salt intake and exercise daily -     CMP14+EGFR -     hydrochlorothiazide (HYDRODIURIL) 25 MG tablet; TAKE 1 TABLET(25 MG) BY MOUTH DAILY IN THE MORNING -     losartan (COZAAR) 50 MG tablet; Take 1 tablet (50 mg total) by mouth daily.  Mixed  hyperlipidemia Recommend to continue to decrease your fatty foods, red meat, cheese, milk and increase fiber like whole grains and veggies. -     Lipid Panel  Anemia, unspecified type Lack enough healthy red blood cells to carry adequate oxygen to your body's tissues. Anemia can be temporary or long term, and it can range from mild to severe -     CBC with Differential  Need for hepatitis C screening test Preventive care, health maintenance, caregiver -     Hepatitis C Antibody  Vitamin D deficiency Hx of vitamin D- Vitamin D is needed to make and keep bones strong.  -     Vitamin D, 25-hydroxy  Medication refill -     hydrochlorothiazide (HYDRODIURIL) 25 MG tablet; TAKE 1 TABLET(25 MG) BY MOUTH DAILY IN THE MORNING -     losartan (COZAAR) 50 MG tablet; Take 1 tablet (50 mg total) by mouth daily.    Meds ordered this encounter  Medications  . hydrochlorothiazide (HYDRODIURIL) 25 MG tablet    Sig: TAKE 1 TABLET(25 MG) BY MOUTH DAILY IN THE MORNING    Dispense:  90 tablet    Refill:  1  . losartan (COZAAR) 50 MG tablet    Sig: Take 1 tablet (50 mg total) by mouth daily.    Dispense:  90 tablet    Refill:  1  . amLODipine (NORVASC) 10 MG tablet    Sig: Take 1 tablet (10 mg total) by mouth daily.    Dispense:  90 tablet    Refill:  3    Follow-up: Return in about 2 months (around 07/23/2020) for Bp follow up .    Kerin Perna, NP

## 2020-05-24 LAB — CMP14+EGFR
ALT: 70 IU/L — ABNORMAL HIGH (ref 0–32)
AST: 39 IU/L (ref 0–40)
Albumin/Globulin Ratio: 1.4 (ref 1.2–2.2)
Albumin: 3.9 g/dL (ref 3.8–4.8)
Alkaline Phosphatase: 144 IU/L — ABNORMAL HIGH (ref 44–121)
BUN/Creatinine Ratio: 15 (ref 9–23)
BUN: 10 mg/dL (ref 6–24)
Bilirubin Total: 0.3 mg/dL (ref 0.0–1.2)
CO2: 25 mmol/L (ref 20–29)
Calcium: 9.4 mg/dL (ref 8.7–10.2)
Chloride: 106 mmol/L (ref 96–106)
Creatinine, Ser: 0.68 mg/dL (ref 0.57–1.00)
GFR calc Af Amer: 119 mL/min/{1.73_m2} (ref >59)
GFR calc non Af Amer: 103 mL/min/{1.73_m2} (ref >59)
Globulin, Total: 2.7 g/dL (ref 1.5–4.5)
Glucose: 109 mg/dL — ABNORMAL HIGH (ref 65–99)
Potassium: 4.3 mmol/L (ref 3.5–5.2)
Sodium: 142 mmol/L (ref 134–144)
Total Protein: 6.6 g/dL (ref 6.0–8.5)

## 2020-05-24 LAB — CBC WITH DIFFERENTIAL/PLATELET
Basophils Absolute: 0 10*3/uL (ref 0.0–0.2)
Basos: 0 %
EOS (ABSOLUTE): 0.1 10*3/uL (ref 0.0–0.4)
Eos: 1 %
Hematocrit: 37.8 % (ref 34.0–46.6)
Hemoglobin: 12.7 g/dL (ref 11.1–15.9)
Immature Grans (Abs): 0 10*3/uL (ref 0.0–0.1)
Immature Granulocytes: 0 %
Lymphocytes Absolute: 1.8 10*3/uL (ref 0.7–3.1)
Lymphs: 24 %
MCH: 29.1 pg (ref 26.6–33.0)
MCHC: 33.6 g/dL (ref 31.5–35.7)
MCV: 87 fL (ref 79–97)
Monocytes Absolute: 0.6 10*3/uL (ref 0.1–0.9)
Monocytes: 7 %
Neutrophils Absolute: 5 10*3/uL (ref 1.4–7.0)
Neutrophils: 68 %
Platelets: 277 10*3/uL (ref 150–450)
RBC: 4.37 x10E6/uL (ref 3.77–5.28)
RDW: 13.3 % (ref 11.7–15.4)
WBC: 7.5 10*3/uL (ref 3.4–10.8)

## 2020-05-24 LAB — LIPID PANEL
Chol/HDL Ratio: 4.2 ratio (ref 0.0–4.4)
Cholesterol, Total: 175 mg/dL (ref 100–199)
HDL: 42 mg/dL (ref >39)
LDL Chol Calc (NIH): 116 mg/dL — ABNORMAL HIGH (ref 0–99)
Triglycerides: 91 mg/dL (ref 0–149)
VLDL Cholesterol Cal: 17 mg/dL (ref 5–40)

## 2020-05-24 LAB — VITAMIN D 25 HYDROXY (VIT D DEFICIENCY, FRACTURES): Vit D, 25-Hydroxy: 26.9 ng/mL — ABNORMAL LOW (ref 30.0–100.0)

## 2020-05-24 LAB — HEPATITIS C ANTIBODY: Hep C Virus Ab: <0.1 s/co ratio (ref 0.0–0.9)

## 2020-05-28 ENCOUNTER — Other Ambulatory Visit (INDEPENDENT_AMBULATORY_CARE_PROVIDER_SITE_OTHER): Payer: Self-pay | Admitting: Primary Care

## 2020-05-28 DIAGNOSIS — E7800 Pure hypercholesterolemia, unspecified: Secondary | ICD-10-CM

## 2020-05-28 MED ORDER — ATORVASTATIN CALCIUM 10 MG PO TABS: 10.0000 mg | ORAL_TABLET | Freq: Every day | ORAL | 3 refills | Status: DC

## 2020-06-12 ENCOUNTER — Encounter: Payer: Self-pay | Admitting: Orthopaedic Surgery

## 2020-06-14 ENCOUNTER — Ambulatory Visit: Payer: Self-pay

## 2020-06-14 ENCOUNTER — Other Ambulatory Visit: Payer: Self-pay

## 2020-06-14 ENCOUNTER — Ambulatory Visit (HOSPITAL_COMMUNITY): Payer: Medicare Other

## 2020-06-14 ENCOUNTER — Ambulatory Visit (INDEPENDENT_AMBULATORY_CARE_PROVIDER_SITE_OTHER): Payer: Medicare Other | Admitting: Orthopaedic Surgery

## 2020-06-14 DIAGNOSIS — Z96643 Presence of artificial hip joint, bilateral: Secondary | ICD-10-CM | POA: Diagnosis not present

## 2020-06-14 DIAGNOSIS — S82852G Displaced trimalleolar fracture of left lower leg, subsequent encounter for closed fracture with delayed healing: Secondary | ICD-10-CM

## 2020-06-14 MED ORDER — DICLOFENAC SODIUM 75 MG PO TBEC
75.0000 mg | DELAYED_RELEASE_TABLET | Freq: Two times a day (BID) | ORAL | 2 refills | Status: DC
Start: 2020-06-14 — End: 2021-05-29

## 2020-06-14 NOTE — Progress Notes (Signed)
Office Visit Note   Patient: Patricia Davila           Date of Birth: 08-24-1970           MRN: [id number] Visit Date: 06/14/2020              Requested by: Kerin Perna, NP [address] [place],  [place] [address] PCP: Kerin Perna, NP   Assessment & Plan: Visit Diagnoses:  1. Status post total replacement of both hips   2. Displaced trimalleolar fracture of left lower leg, subsequent encounter for closed fracture with delayed healing     Plan: Impression is stable bilateral total hip replacements.  She is 3 years from the surgeries and she has done well.  In terms of the ankle swelling I doubt that she has a DVT but I would still like to get a Doppler to rule this out.  We have given her prescription for compression socks.  Prescription for diclofenac to help with the inflammation.  We also discussed that given the extent of her ankle injury and surgery it is not abnormal to have chronic swelling especially with history of a DVT which may cause post thrombotic syndrome.  Follow-Up Instructions: Return if symptoms worsen or fail to improve.   Orders:  Orders Placed This Encounter  Procedures  . XR HIPS BILAT W OR W/O PELVIS 3-4 VIEWS  . XR Ankle Complete Left  . VAS [place] LOWER EXTREMITY VENOUS (DVT)   Meds ordered this encounter  Medications  . diclofenac (VOLTAREN) 75 MG EC tablet    Sig: Take 1 tablet (75 mg total) by mouth 2 (two) times daily.    Dispense:  30 tablet    Refill:  2      Procedures: No procedures performed   Clinical Data: No additional findings.   Subjective: Chief Complaint  Patient presents with  . Left Ankle - Follow-up    Left ankle ORIF 03/17/2019    [name] comes in today for evaluation of continued left ankle swelling.  She has trouble wearing shoes other than crocs.  She feels pain when she is upright or weightbearing.  She is walking with a limp.  Denies any constitutional symptoms.  Overall she is doing okay from her hip  replacements.  Denies any calf tenderness.  She has completed her course of Xarelto for DVT.   Review of Systems  Constitutional: Negative.   HENT: Negative.   Eyes: Negative.   Respiratory: Negative.   Cardiovascular: Negative.   Endocrine: Negative.   Musculoskeletal: Negative.   Neurological: Negative.   Hematological: Negative.   Psychiatric/Behavioral: Negative.   All other systems reviewed and are negative.    Objective: Vital Signs: There were no vitals taken for this visit.  Physical Exam Vitals and nursing note reviewed.  Constitutional:      Appearance: She is well-developed.  HENT:     Head: Normocephalic and atraumatic.  Pulmonary:     Effort: Pulmonary effort is normal.  Abdominal:     Palpations: Abdomen is soft.  Musculoskeletal:     Cervical back: Neck supple.  Skin:    General: Skin is warm.     Capillary Refill: Capillary refill takes less than 2 seconds.  Neurological:     Mental Status: She is alert and oriented to person, place, and time.  Psychiatric:        Behavior: Behavior normal.        Thought Content: Thought content normal.  Judgment: Judgment normal.     Ortho Exam Bilateral hip exams are unremarkable.  Left ankle shows mild swelling and edema.  No neurovascular compromise.  Range of motion is asymptomatic. Specialty Comments:  No specialty comments available.  Imaging: XR Ankle Complete Left  Result Date: 06/14/2020 Stable fixation of ankle fracture.  Fractures are completely healed.  No complications.  No evidence of degenerative joint disease  XR HIPS BILAT W OR W/O PELVIS 3-4 VIEWS  Result Date: 06/14/2020 Stable bilateral hip replacements without complication.    PMFS History: Patient Active Problem List   Diagnosis Date Noted  . Pulmonary embolism ([place]) 06/07/2019  . 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 Displaced trimalleolar fracture of left lower leg, subsequent encounter for closed fracture with delayed  healing 03/17/2019  . Primary osteoarthritis of right hip 04/08/2017  . History of hip replacement 03/25/2017  . Primary osteoarthritis of left hip 12/22/2016  . Major depressive disorder 10/24/2012  . Smokes tobacco daily 05/13/2010  . Headache(784.0) 05/13/2010  . Microalbuminuria 04/16/2009  . GERD 11/22/2008  . Left hip pain 11/22/2008  . Bipolar 1 disorder, depressed ([place]) 05/17/2007  . Lumbosacral degenerative disk disease 03/23/2005  . Essential hypertension, benign 08/18/2003   Past Medical History:  Diagnosis Date  . Anemia    low iron during pregnancy  . Anxiety   . Arthritis   . Asthma    as a child  . Bipolar disorder ([place])   . Depression   . GERD (gastroesophageal reflux disease)   . Headache   . Hypertension   . Left trimalleolar fracture   . Pneumonia    as a child  . PONV (postoperative nausea and vomiting)   . Restless legs     Family History  Problem Relation Age of Onset  . Colon cancer Mother   . HIV Mother   . Colon cancer Sister   . Breast cancer Neg Hx     Past Surgical History:  Procedure Laterality Date  . BILATERAL ANTERIOR TOTAL HIP ARTHROPLASTY Bilateral 03/25/2017   Procedure: BILATERAL ANTERIOR TOTAL HIP ARTHROPLASTY;  Surgeon: Leandrew Koyanagi, MD;  Location: [facility];  Service: Orthopedics;  Laterality: Bilateral;  . DENTAL SURGERY     all teeth extracted  . INTRAVASCULAR ULTRASOUND/IVUS N/A 06/09/2019   Procedure: INTRAVASCULAR ULTRASOUND/IVUS;  Surgeon: Elam Dutch, MD;  Location: [facility];  Service: Cardiovascular;  Laterality: N/A;  . LAMINECTOMY AND MICRODISCECTOMY LUMBAR SPINE   06/27/2010  . ORIF ANKLE FRACTURE Left 03/17/2019   Procedure: OPEN REDUCTION INTERNAL FIXATION (ORIF) LEFT ANKLE FRACTURE;  Surgeon: Leandrew Koyanagi, MD;  Location: [facility];  Service: Orthopedics;  Laterality: Left;  . PERIPHERAL VASCULAR INTERVENTION Left 06/09/2019   Procedure: PERIPHERAL VASCULAR INTERVENTION;  Surgeon:  Elam Dutch, MD;  Location: [facility];  Service: Cardiovascular;  Laterality: Left;  Common Iliac vein  . PERIPHERAL VASCULAR THROMBECTOMY Left 06/09/2019   Procedure: PERIPHERAL VASCULAR THROMBECTOMY;  Surgeon: Elam Dutch, MD;  Location: [facility];  Service: Cardiovascular;  Laterality: Left;  . TUBAL LIGATION Bilateral    Social History   Occupational History  . Not on file  Tobacco Use  . Smoking status: Former Smoker    Packs/day: 0.25    Types: Cigarettes  . Smokeless tobacco: Never Used  Vaping Use  . Vaping Use: Never used  Substance and Sexual Activity  . Alcohol use: No  . Drug use: No  . Sexual activity:  Yes    Birth control/protection: Surgical    Comment: BTL

## 2020-06-17 ENCOUNTER — Encounter (INDEPENDENT_AMBULATORY_CARE_PROVIDER_SITE_OTHER): Payer: Self-pay | Admitting: Primary Care

## 2020-06-17 ENCOUNTER — Other Ambulatory Visit: Payer: Self-pay

## 2020-06-17 ENCOUNTER — Ambulatory Visit (HOSPITAL_COMMUNITY)
Admission: RE | Admit: 2020-06-17 | Discharge: 2020-06-17 | Disposition: A | Discharge status: Home / Self Care | Payer: Medicare Other | Source: Ambulatory Visit | Attending: Orthopaedic Surgery | Admitting: Orthopaedic Surgery

## 2020-06-17 DIAGNOSIS — S82852G Displaced trimalleolar fracture of left lower leg, subsequent encounter for closed fracture with delayed healing: Secondary | ICD-10-CM

## 2020-06-20 ENCOUNTER — Encounter (INDEPENDENT_AMBULATORY_CARE_PROVIDER_SITE_OTHER): Payer: Self-pay | Admitting: Primary Care

## 2020-06-21 ENCOUNTER — Ambulatory Visit (INDEPENDENT_AMBULATORY_CARE_PROVIDER_SITE_OTHER): Payer: Medicare Other | Admitting: Primary Care

## 2020-07-23 ENCOUNTER — Other Ambulatory Visit (INDEPENDENT_AMBULATORY_CARE_PROVIDER_SITE_OTHER): Payer: Self-pay | Admitting: Primary Care

## 2020-07-23 ENCOUNTER — Ambulatory Visit (INDEPENDENT_AMBULATORY_CARE_PROVIDER_SITE_OTHER): Payer: Medicare Other | Admitting: Primary Care

## 2020-07-23 ENCOUNTER — Other Ambulatory Visit: Payer: Self-pay | Admitting: Vascular Surgery

## 2020-07-23 DIAGNOSIS — I82402 Acute embolism and thrombosis of unspecified deep veins of left lower extremity: Secondary | ICD-10-CM

## 2020-07-23 DIAGNOSIS — Z760 Encounter for issue of repeat prescription: Secondary | ICD-10-CM

## 2020-07-31 ENCOUNTER — Ambulatory Visit (INDEPENDENT_AMBULATORY_CARE_PROVIDER_SITE_OTHER): Payer: Medicare Other | Admitting: Primary Care

## 2020-08-02 ENCOUNTER — Ambulatory Visit (INDEPENDENT_AMBULATORY_CARE_PROVIDER_SITE_OTHER): Payer: Medicare Other | Admitting: Primary Care

## 2020-08-04 ENCOUNTER — Encounter: Payer: Self-pay | Admitting: Orthopaedic Surgery

## 2020-08-05 ENCOUNTER — Other Ambulatory Visit: Payer: Self-pay | Admitting: Physician Assistant

## 2020-08-05 MED ORDER — METHOCARBAMOL 500 MG PO TABS: 500.0000 mg | ORAL_TABLET | Freq: Two times a day (BID) | ORAL | 0 refills | Status: DC | PRN

## 2020-08-05 NOTE — Telephone Encounter (Signed)
I sent in robaxin, but we cannot refill pain meds

## 2020-08-27 DIAGNOSIS — Z03818 Encounter for observation for suspected exposure to other biological agents ruled out: Secondary | ICD-10-CM | POA: Diagnosis not present

## 2020-08-27 DIAGNOSIS — Z20822 Contact with and (suspected) exposure to covid-19: Secondary | ICD-10-CM | POA: Diagnosis not present

## 2020-09-04 DIAGNOSIS — Z20822 Contact with and (suspected) exposure to covid-19: Secondary | ICD-10-CM | POA: Diagnosis not present

## 2020-09-04 DIAGNOSIS — Z03818 Encounter for observation for suspected exposure to other biological agents ruled out: Secondary | ICD-10-CM | POA: Diagnosis not present

## 2020-09-10 DIAGNOSIS — Z03818 Encounter for observation for suspected exposure to other biological agents ruled out: Secondary | ICD-10-CM | POA: Diagnosis not present

## 2020-09-10 DIAGNOSIS — Z20822 Contact with and (suspected) exposure to covid-19: Secondary | ICD-10-CM | POA: Diagnosis not present

## 2020-09-23 ENCOUNTER — Other Ambulatory Visit: Payer: Self-pay | Admitting: Vascular Surgery

## 2020-10-01 DIAGNOSIS — Z20822 Contact with and (suspected) exposure to covid-19: Secondary | ICD-10-CM | POA: Diagnosis not present

## 2020-10-01 DIAGNOSIS — Z03818 Encounter for observation for suspected exposure to other biological agents ruled out: Secondary | ICD-10-CM | POA: Diagnosis not present

## 2020-10-08 DIAGNOSIS — Z03818 Encounter for observation for suspected exposure to other biological agents ruled out: Secondary | ICD-10-CM | POA: Diagnosis not present

## 2020-10-08 DIAGNOSIS — Z20822 Contact with and (suspected) exposure to covid-19: Secondary | ICD-10-CM | POA: Diagnosis not present

## 2020-10-09 IMAGING — CR LEFT TIBIA AND FIBULA - 2 VIEW
3 series · 3 of 3 positions shown · non-contrast
Comparison: None.

CLINICAL DATA: Fall, deformity to LEFT ankle.

EXAM:
LEFT TIBIA AND FIBULA - 2 VIEW

[tibia ap]
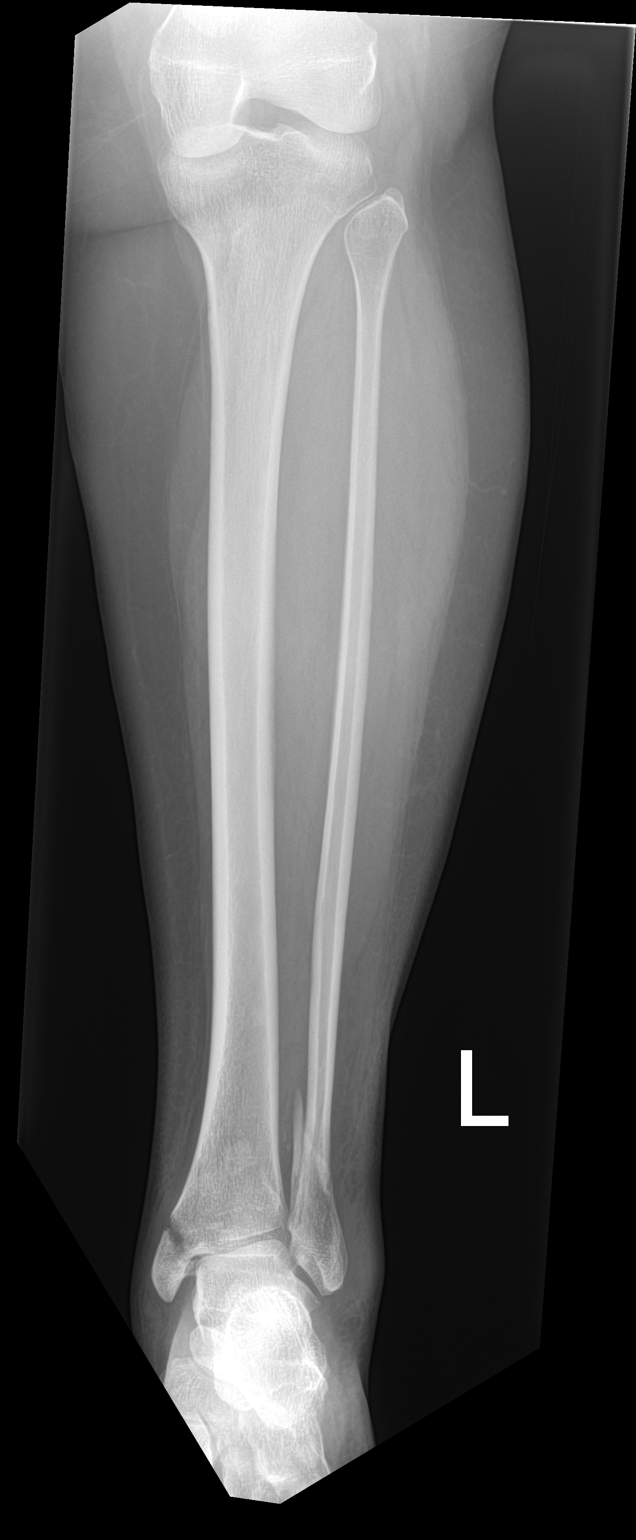

[tibia lat (1 of 2)]
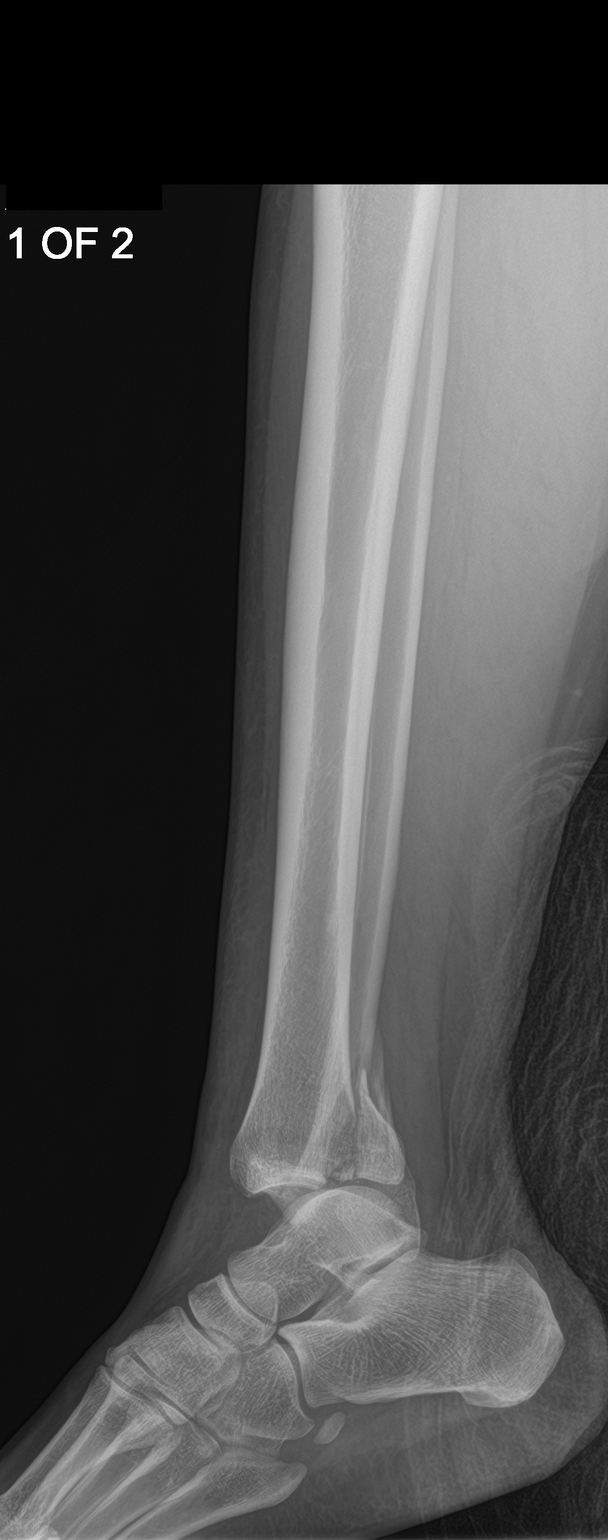

[tibia lat (2 of 2)]
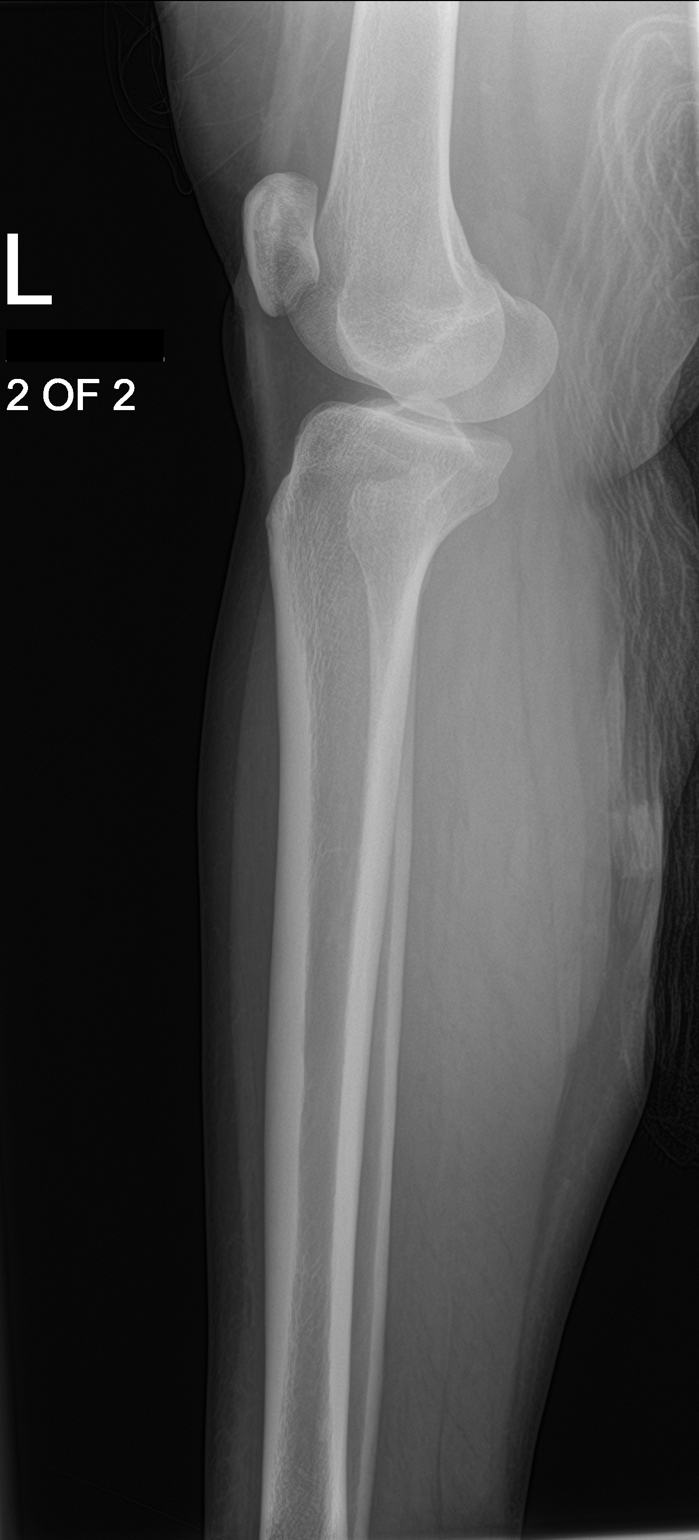

[3 of 3 positions shown; findings below may reference images not displayed]

FINDINGS: Displaced fractures of the medial malleolus, distal fibula and
posterior malleolus. Anterior displacement of the distal tibia
relative to the underlying talar dome.

Proximal tibia and fibula are intact and normally aligned.
IMPRESSION: 1. Trimalleolar displaced fractures. Anterior displacement of the
distal tibia relative to the underlying talar dome.
2. Proximal tibia and fibula are intact and normally aligned.

## 2020-10-16 DIAGNOSIS — Z03818 Encounter for observation for suspected exposure to other biological agents ruled out: Secondary | ICD-10-CM | POA: Diagnosis not present

## 2020-10-16 DIAGNOSIS — Z20822 Contact with and (suspected) exposure to covid-19: Secondary | ICD-10-CM | POA: Diagnosis not present

## 2020-10-22 DIAGNOSIS — Z03818 Encounter for observation for suspected exposure to other biological agents ruled out: Secondary | ICD-10-CM | POA: Diagnosis not present

## 2020-10-22 DIAGNOSIS — Z20822 Contact with and (suspected) exposure to covid-19: Secondary | ICD-10-CM | POA: Diagnosis not present

## 2020-10-23 ENCOUNTER — Encounter (INDEPENDENT_AMBULATORY_CARE_PROVIDER_SITE_OTHER): Payer: Medicare Other | Admitting: Primary Care

## 2020-10-23 ENCOUNTER — Ambulatory Visit (INDEPENDENT_AMBULATORY_CARE_PROVIDER_SITE_OTHER): Payer: Medicare Other | Admitting: Primary Care

## 2020-10-23 ENCOUNTER — Encounter (INDEPENDENT_AMBULATORY_CARE_PROVIDER_SITE_OTHER): Payer: Self-pay | Admitting: Primary Care

## 2020-10-23 ENCOUNTER — Other Ambulatory Visit: Payer: Self-pay

## 2020-10-23 VITALS — BP 121/83 | HR 76 | Temp 97.2°F | Ht 61.0 in | Wt 213.6 lb

## 2020-10-23 DIAGNOSIS — E7800 Pure hypercholesterolemia, unspecified: Secondary | ICD-10-CM

## 2020-10-23 DIAGNOSIS — I10 Essential (primary) hypertension: Secondary | ICD-10-CM

## 2020-10-23 DIAGNOSIS — R252 Cramp and spasm: Secondary | ICD-10-CM | POA: Diagnosis not present

## 2020-10-23 DIAGNOSIS — Z760 Encounter for issue of repeat prescription: Secondary | ICD-10-CM

## 2020-10-23 DIAGNOSIS — N3946 Mixed incontinence: Secondary | ICD-10-CM

## 2020-10-23 DIAGNOSIS — Z1211 Encounter for screening for malignant neoplasm of colon: Secondary | ICD-10-CM | POA: Diagnosis not present

## 2020-10-23 DIAGNOSIS — Z0000 Encounter for general adult medical examination without abnormal findings: Secondary | ICD-10-CM

## 2020-10-23 LAB — POCT URINALYSIS DIP (CLINITEK)
Bilirubin, UA: NEGATIVE
Blood, UA: NEGATIVE
Glucose, UA: NEGATIVE mg/dL
Ketones, POC UA: NEGATIVE mg/dL
Leukocytes, UA: NEGATIVE
Nitrite, UA: NEGATIVE
POC PROTEIN,UA: NEGATIVE
Spec Grav, UA: 1.025 (ref 1.010–1.025)
Urobilinogen, UA: 0.2 E.U./dL
pH, UA: 5 (ref 5.0–8.0)

## 2020-10-23 MED ORDER — ATORVASTATIN CALCIUM 10 MG PO TABS: 10.0000 mg | ORAL_TABLET | Freq: Every day | ORAL | 3 refills | Status: DC

## 2020-10-23 MED ORDER — OXYBUTYNIN CHLORIDE ER 5 MG PO TB24: 5.0000 mg | ORAL_TABLET | Freq: Every day | ORAL | 1 refills | Status: DC

## 2020-10-23 MED ORDER — AMLODIPINE BESYLATE 10 MG PO TABS: 10.0000 mg | ORAL_TABLET | Freq: Every day | ORAL | 3 refills | Status: DC

## 2020-10-23 MED ORDER — LOSARTAN POTASSIUM 50 MG PO TABS: 50.0000 mg | ORAL_TABLET | Freq: Every day | ORAL | 1 refills | Status: DC

## 2020-10-23 NOTE — Patient Instructions (Signed)
Leg Cramps Leg cramps occur when one or more muscles tighten and a person has no control over it (involuntary muscle contraction). Muscle cramps are most common in the calf muscles of the leg. They can occur during exercise or at rest. Leg cramps are painful, and they may last for a few seconds to a few minutes. Cramps may return several times before they finally stop. Usually, leg cramps are not caused by a serious medical problem. In many cases, the cause is not known. Some common causes include:  Excessive physical effort (overexertion), such as during intense exercise.  Doing the same motion over and over.  Staying in a certain position for a long period of time.  Improper preparation, form, or technique while doing a sport or an activity.  Dehydration.  Injury.  Side effects of certain medicines.  Abnormally low levels of minerals in your blood (electrolytes), especially potassium and calcium. This could result from: ? Pregnancy. ? Taking diuretic medicines. Follow these instructions at home: Eating and drinking  Drink enough fluid to keep your urine pale yellow. Staying hydrated may help prevent cramps.  Eat a healthy diet that includes plenty of nutrients to help your muscles function. A healthy diet includes fruits and vegetables, lean protein, whole grains, and low-fat or nonfat dairy products. Managing pain, stiffness, and swelling  Try massaging, stretching, and relaxing the affected muscle. Do this for several minutes at a time.  If directed, put ice on areas that are sore or painful after a cramp. To do this: ? Put ice in a plastic bag. ? Place a towel between your skin and the bag. ? Leave the ice on for 20 minutes, 2-3 times a day. ? Remove the ice if your skin turns bright red. This is very important. If you cannot feel pain, heat, or cold, you have a greater risk of damage to the area.  If directed, apply heat to muscles that are tense or tight. Do this before  you exercise, or as often as told by your health care provider. Use the heat source that your health care provider recommends, such as a moist heat pack or a heating pad. To do this: ? Place a towel between your skin and the heat source. ? Leave the heat on for 20-30 minutes. ? Remove the heat if your skin turns bright red. This is especially important if you are unable to feel pain, heat, or cold. You may have a greater risk of getting burned.  Try taking hot showers or baths to help relax tight muscles.      General instructions  If you are having frequent leg cramps, avoid intense exercise for several days.  Take over-the-counter and prescription medicines only as told by your health care provider.  Keep all follow-up visits. This is important. Contact a health care provider if:  Your leg cramps get more severe or more frequent, or they do not improve over time.  Your foot becomes cold, numb, or blue. Summary  Muscle cramps can develop in any muscle, but the most common place is in the calf muscles of the leg.  Leg cramps are painful, and they may last for a few seconds to a few minutes.  Usually, leg cramps are not caused by a serious medical problem. Often, the cause is not known.  Stay hydrated, and take over-the-counter and prescription medicines only as told by your health care provider. This information is not intended to replace advice given to you by your   health care provider. Make sure you discuss any questions you have with your health care provider. Document Revised: 12/20/2019 Document Reviewed: 12/20/2019 Elsevier Patient Education  2021 [employer]  

## 2020-10-23 NOTE — Progress Notes (Signed)
Established Patient Office Visit  Subjective:  Patient ID: Patricia Davila, female    DOB: 1970/10/22  Age: 50 y.o. MRN: [id number]  CC:  Chief Complaint  Patient presents with  . Annual Exam    HPI Patricia Davila presents for blood pressure management -Denies shortness of breath, headaches, chest pain or lower extremity edema, sudden onset, vision changes, unilateral weakness, dizziness, paresthesias and concerned with urinary incontinence  INCONTINENCE:  During the last three months, have you leaked urine (even if a small amount)?  Yes[x]   No[]   2. During the last three months, did you leak urine (check all that apply): [x] When you were performing some physical activity, such as coughing, sneezing, lifting, or exercise?  [] When you had the urge or the feeling that you needed to empty your bladder, but you could not get to the toilet fast enough? [x] Without physical activity and without a sense of urgency?   3. During the last three months, did you leak urine most often (check only one):  [x] When you were performing some physical activity, such as coughing, sneezing, lifting, or exercise?  [] When you had the urge or the feeling that you needed to empty your bladder, but you could not get to the toilet fast enough? [x] Without physical activity and without a sense of urgency?  [] About equally as often with physical activity as with a sense of urgency?   Definitions of type of urinary incontinence are based on responses to question 3: Most often with physical activity: stress only or stress predominant Most often with the urge to empty the bladder: urge only or urge predominant Without physical activity or sense of urgency: other cause only or other cause predominant  About equally with physical activity and sense of urgency: mixed   Past Medical History:  Diagnosis Date  . Anemia    low iron during pregnancy  . Anxiety   . Arthritis   . Asthma    as a child  . Bipolar disorder  ([place])   . Depression   . GERD (gastroesophageal reflux disease)   . Headache   . Hypertension   . Left trimalleolar fracture   . Pneumonia    as a child  . PONV (postoperative nausea and vomiting)   . Restless legs     Past Surgical History:  Procedure Laterality Date  . BILATERAL ANTERIOR TOTAL HIP ARTHROPLASTY Bilateral 03/25/2017   Procedure: BILATERAL ANTERIOR TOTAL HIP ARTHROPLASTY;  Surgeon: Leandrew Koyanagi, MD;  Location: [facility];  Service: Orthopedics;  Laterality: Bilateral;  . DENTAL SURGERY     all teeth extracted  . INTRAVASCULAR ULTRASOUND/IVUS N/A 06/09/2019   Procedure: INTRAVASCULAR ULTRASOUND/IVUS;  Surgeon: Elam Dutch, MD;  Location: [facility];  Service: Cardiovascular;  Laterality: N/A;  . LAMINECTOMY AND MICRODISCECTOMY LUMBAR SPINE   06/27/2010  . ORIF ANKLE FRACTURE Left 03/17/2019   Procedure: OPEN REDUCTION INTERNAL FIXATION (ORIF) LEFT ANKLE FRACTURE;  Surgeon: Leandrew Koyanagi, MD;  Location: [facility];  Service: Orthopedics;  Laterality: Left;  . PERIPHERAL VASCULAR INTERVENTION Left 06/09/2019   Procedure: PERIPHERAL VASCULAR INTERVENTION;  Surgeon: Elam Dutch, MD;  Location: [facility];  Service: Cardiovascular;  Laterality: Left;  Common Iliac vein  . PERIPHERAL VASCULAR THROMBECTOMY Left 06/09/2019   Procedure: PERIPHERAL VASCULAR THROMBECTOMY;  Surgeon: Elam Dutch, MD;  Location: [facility];  Service: Cardiovascular;  Laterality: Left;  . TUBAL LIGATION Bilateral     Family History  Problem Relation Age of Onset  . Colon cancer Mother   . HIV Mother   . Colon cancer Sister   . Breast cancer Neg Hx     Social History   Socioeconomic History  . Marital status: Married    Spouse name: Not on file  . Number of children: Not on file  . Years of education: Not on file  . Highest education level: Not on file  Occupational History  . Not on file  Tobacco Use  . Smoking status: Former Smoker     Packs/day: 0.25    Types: Cigarettes  . Smokeless tobacco: Never Used  Vaping Use  . Vaping Use: Never used  Substance and Sexual Activity  . Alcohol use: No  . Drug use: No  . Sexual activity: Yes    Birth control/protection: Surgical    Comment: BTL  Other Topics Concern  . Not on file  Social History Narrative  . Not on file   Social Determinants of Health   Financial Resource Strain: Not on file  Food Insecurity: Not on file  Transportation Needs: Not on file  Physical Activity: Not on file  Stress: Not on file  Social Connections: Not on file  Intimate Partner Violence: Not on file    Outpatient Medications Prior to Visit  Medication Sig Dispense Refill  . cyclobenzaprine (FLEXERIL) 5 MG tablet TAKE 1 TO 2 TABLETS(5 TO 10 MG) BY MOUTH THREE TIMES DAILY AS NEEDED FOR MUSCLE SPASMS 30 tablet 3  . diclofenac (VOLTAREN) 75 MG EC tablet Take 1 tablet (75 mg total) by mouth 2 (two) times daily. 30 tablet 2  . hydrochlorothiazide (HYDRODIURIL) 25 MG tablet TAKE 1 TABLET(25 MG) BY MOUTH DAILY IN THE MORNING 90 tablet 1  . hydrOXYzine (VISTARIL) 25 MG capsule TAKE 1 CAPSULE(25 MG) BY MOUTH THREE TIMES DAILY AS NEEDED 60 capsule 1  . omeprazole (PRILOSEC) 40 MG capsule TAKE 1 CAPSULE(40 MG) BY MOUTH DAILY 90 capsule 3  . amLODipine (NORVASC) 10 MG tablet Take 1 tablet (10 mg total) by mouth daily. 90 tablet 3  . atorvastatin (LIPITOR) 10 MG tablet Take 1 tablet (10 mg total) by mouth daily. 90 tablet 3  . losartan (COZAAR) 50 MG tablet Take 1 tablet (50 mg total) by mouth daily. 90 tablet 1  . Cholecalciferol (VITAMIN D3) 50 MCG (2000 UT) TABS TAKE 1 TABLET BY MOUTH EVERY DAY (Patient not taking: Reported on 10/23/2020) 30 tablet 2  . SUMAtriptan (IMITREX) 25 MG tablet TAKE 1 TABLET BY MOUTH EVERY 2 HOURS AS NEEDED FOR MIGRAINE,MAY REPEAT IN 2 HOURS IF NEEDED (Patient not taking: Reported on 10/23/2020) 10 tablet 1  . methocarbamol (ROBAXIN) 500 MG tablet Take 1 tablet (500 mg total)  by mouth 2 (two) times daily as needed. 20 tablet 0   No facility-administered medications prior to visit.    Allergies  Allergen Reactions  . Ativan [Lorazepam] Anxiety    HALLUCINATIONS  . Anesthesia S-I-40 [Propofol]     hallucinations  . Tylenol With Codeine #3 [Acetaminophen-Codeine] Hives and Itching    ROS Review of Systems Pertinent negative and positive noted in HPI   Objective:    Physical Exam Vitals reviewed.  Constitutional:      Appearance: She is obese.     Comments: morbid  HENT:     Right Ear: Tympanic membrane and external ear normal.     Left Ear: Tympanic membrane and external ear normal.     Nose: Nose normal.  Eyes:     Extraocular Movements: Extraocular movements intact.     Pupils: Pupils are equal, round, and reactive to light.  Cardiovascular:     Rate and Rhythm: Normal rate and regular rhythm.  Pulmonary:     Effort: Pulmonary effort is normal.     Breath sounds: Normal breath sounds.  Abdominal:     General: Bowel sounds are normal. There is distension.     Palpations: Abdomen is soft.  Musculoskeletal:        General: Normal range of motion.     Cervical back: Normal range of motion and neck supple.  Skin:    General: Skin is warm and dry.  Neurological:     Mental Status: She is alert and oriented to person, place, and time.  Psychiatric:        Mood and Affect: Mood normal.        Behavior: Behavior normal.        Thought Content: Thought content normal.        Judgment: Judgment normal.     BP 121/83 (BP Location: Right Arm, Patient Position: Sitting, Cuff Size: Large)   Pulse 76   Temp (!) 97.2 F (36.2 C) (Temporal)   Ht 5\' 1"  (1.549 m)   Wt 213 lb 9.6 oz (96.9 kg)   SpO2 97%   BMI 40.36 kg/m  Wt Readings from Last 3 Encounters:  10/23/20 213 lb 9.6 oz (96.9 kg)  05/23/20 211 lb 12.8 oz (96.1 kg)  12/13/19 195 lb (88.5 kg)     Health Maintenance Due  Topic Date Due  . COVID-19 Vaccine (1) Never done  .  COLONOSCOPY (Pts 45-69yrs Insurance coverage will need to be confirmed)  Never done    There are no preventive care reminders to display for this patient.  Lab Results  Component Value Date   TSH 0.524 05/19/2018   Lab Results  Component Value Date   WBC 7.5 05/23/2020   HGB 12.7 05/23/2020   HCT 37.8 05/23/2020   MCV 87 05/23/2020   PLT 277 05/23/2020   Lab Results  Component Value Date   NA 142 05/23/2020   K 4.3 05/23/2020   CO2 25 05/23/2020   GLUCOSE 109 (H) 05/23/2020   BUN 10 05/23/2020   CREATININE 0.68 05/23/2020   BILITOT 0.3 05/23/2020   ALKPHOS 144 (H) 05/23/2020   AST 39 05/23/2020   ALT 70 (H) 05/23/2020   PROT 6.6 05/23/2020   ALBUMIN 3.9 05/23/2020   CALCIUM 9.4 05/23/2020   ANIONGAP 9 12/13/2019   Lab Results  Component Value Date   CHOL 175 05/23/2020   Lab Results  Component Value Date   HDL 42 05/23/2020   Lab Results  Component Value Date   LDLCALC 116 (H) 05/23/2020   Lab Results  Component Value Date   TRIG 91 05/23/2020   Lab Results  Component Value Date   CHOLHDL 4.2 05/23/2020   Lab Results  Component Value Date   HGBA1C 5.1 05/23/2020      Assessment & Plan:  Georgi was seen today for annual exam.  Diagnoses and all orders for this visit:  Annual physical exam Completed Mixed stress and urge urinary incontinence -     POCT URINALYSIS DIP (CLINITEK) -     oxybutynin (DITROPAN-XL) 5 MG 24 hr tablet; Take 1 tablet (5 mg total) by mouth at bedtime.  Colon cancer screening -     Ambulatory referral to Gastroenterology  Leg cramps Increase  water and can be caused from statin use.  Also low potassium and magnesium can contribute to cramps.  On follow-up blood work will be completed  Hypertension, unspecified type -     losartan (COZAAR) 50 MG tablet; Take 1 tablet (50 mg total) by mouth daily. -     amLODipine (NORVASC) 10 MG tablet; Take 1 tablet (10 mg total) by mouth daily.  Medication refill -     losartan  (COZAAR) 50 MG tablet; Take 1 tablet (50 mg total) by mouth daily.  Elevated LDL cholesterol level -     atorvastatin (LIPITOR) 10 MG tablet; Take 1 tablet (10 mg total) by mouth daily  Meds ordered this encounter  Medications  . losartan (COZAAR) 50 MG tablet    Sig: Take 1 tablet (50 mg total) by mouth daily.    Dispense:  90 tablet    Refill:  1  . amLODipine (NORVASC) 10 MG tablet    Sig: Take 1 tablet (10 mg total) by mouth daily.    Dispense:  90 tablet    Refill:  3  . atorvastatin (LIPITOR) 10 MG tablet    Sig: Take 1 tablet (10 mg total) by mouth daily.    Dispense:  90 tablet    Refill:  3  . oxybutynin (DITROPAN-XL) 5 MG 24 hr tablet    Sig: Take 1 tablet (5 mg total) by mouth at bedtime.    Dispense:  90 tablet    Refill:  1    Follow-up: Return in about 3 months (around 01/23/2021) for BP / fasting.    Kerin Perna, NP

## 2020-10-23 NOTE — Progress Notes (Signed)
Every time she coughs she leaks urine  Questions about how to Xarelto just recently ran out

## 2020-10-28 DIAGNOSIS — Z20822 Contact with and (suspected) exposure to covid-19: Secondary | ICD-10-CM | POA: Diagnosis not present

## 2020-10-28 DIAGNOSIS — Z03818 Encounter for observation for suspected exposure to other biological agents ruled out: Secondary | ICD-10-CM | POA: Diagnosis not present

## 2020-11-10 ENCOUNTER — Other Ambulatory Visit (INDEPENDENT_AMBULATORY_CARE_PROVIDER_SITE_OTHER): Payer: Self-pay | Admitting: Primary Care

## 2020-11-10 MED ORDER — SUMATRIPTAN SUCCINATE 25 MG PO TABS: 25.0000 mg | ORAL_TABLET | Freq: Three times a day (TID) | ORAL | 0 refills | Status: DC | PRN

## 2020-11-10 NOTE — Telephone Encounter (Signed)
Approved per protocol.  Requested Prescriptions  Pending Prescriptions Disp Refills  . SUMAtriptan (IMITREX) 25 MG tablet [Pharmacy Med Name: SUMATRIPTAN 25MG  TABLETS] 10 tablet 1    Sig: TAKE 1 TABLET BY MOUTH EVERY 2 HOURS AS NEEDED FOR MIGRAINE,MAY REPEAT IN 2 HOURS IF NEEDED     Neurology:  Migraine Therapy - Triptan Passed - 11/10/2020  2:00 PM      Passed - Last BP in normal range    BP Readings from Last 1 Encounters:  10/23/20 121/83         Passed - Valid encounter within last 12 months    Recent Outpatient Visits          2 weeks ago Annual physical exam   [place], [place], NP   5 months ago Hypertension, unspecified type   [place] Kerin Perna, NP   1 year ago Vaginal discharge   [place], Michelle P, NP   1 year ago Class 2 severe obesity due to excess calories with serious comorbidity in adult, unspecified BMI ([place])   [facility] Kerin Perna, NP   1 year ago Hypertension, unspecified type   [place] Kerin Perna, NP

## 2020-11-14 DIAGNOSIS — Z03818 Encounter for observation for suspected exposure to other biological agents ruled out: Secondary | ICD-10-CM | POA: Diagnosis not present

## 2020-11-14 DIAGNOSIS — Z20822 Contact with and (suspected) exposure to covid-19: Secondary | ICD-10-CM | POA: Diagnosis not present

## 2020-11-28 DIAGNOSIS — Z20822 Contact with and (suspected) exposure to covid-19: Secondary | ICD-10-CM | POA: Diagnosis not present

## 2020-11-28 DIAGNOSIS — Z03818 Encounter for observation for suspected exposure to other biological agents ruled out: Secondary | ICD-10-CM | POA: Diagnosis not present

## 2020-12-06 ENCOUNTER — Encounter (INDEPENDENT_AMBULATORY_CARE_PROVIDER_SITE_OTHER): Payer: Self-pay | Admitting: Primary Care

## 2020-12-06 ENCOUNTER — Encounter: Payer: Self-pay | Admitting: Orthopaedic Surgery

## 2020-12-06 DIAGNOSIS — I2699 Other pulmonary embolism without acute cor pulmonale: Secondary | ICD-10-CM

## 2020-12-06 DIAGNOSIS — Z20822 Contact with and (suspected) exposure to covid-19: Secondary | ICD-10-CM | POA: Diagnosis not present

## 2020-12-06 DIAGNOSIS — I82412 Acute embolism and thrombosis of left femoral vein: Secondary | ICD-10-CM

## 2020-12-06 DIAGNOSIS — Z03818 Encounter for observation for suspected exposure to other biological agents ruled out: Secondary | ICD-10-CM | POA: Diagnosis not present

## 2020-12-09 ENCOUNTER — Other Ambulatory Visit: Payer: Self-pay | Admitting: Physician Assistant

## 2020-12-09 MED ORDER — CYCLOBENZAPRINE HCL 5 MG PO TABS: ORAL_TABLET | ORAL | 3 refills | Status: DC

## 2020-12-09 NOTE — Telephone Encounter (Signed)
Sent in.  Ok to give her a card.  I have them in my top right drawer

## 2020-12-18 ENCOUNTER — Other Ambulatory Visit: Payer: Self-pay

## 2020-12-18 DIAGNOSIS — I82402 Acute embolism and thrombosis of unspecified deep veins of left lower extremity: Secondary | ICD-10-CM

## 2020-12-20 DIAGNOSIS — Z03818 Encounter for observation for suspected exposure to other biological agents ruled out: Secondary | ICD-10-CM | POA: Diagnosis not present

## 2020-12-20 DIAGNOSIS — Z20822 Contact with and (suspected) exposure to covid-19: Secondary | ICD-10-CM | POA: Diagnosis not present

## 2020-12-26 DIAGNOSIS — Z20822 Contact with and (suspected) exposure to covid-19: Secondary | ICD-10-CM | POA: Diagnosis not present

## 2020-12-26 DIAGNOSIS — Z03818 Encounter for observation for suspected exposure to other biological agents ruled out: Secondary | ICD-10-CM | POA: Diagnosis not present

## 2020-12-27 ENCOUNTER — Emergency Department (HOSPITAL_COMMUNITY)
Admission: EM | Admit: 2020-12-27 | Discharge: 2020-12-27 | Disposition: A | Discharge status: Home / Self Care | Payer: Medicare Other | Attending: Emergency Medicine | Admitting: Emergency Medicine

## 2020-12-27 ENCOUNTER — Emergency Department (HOSPITAL_COMMUNITY): Payer: Medicare Other

## 2020-12-27 ENCOUNTER — Encounter (HOSPITAL_COMMUNITY): Payer: Self-pay

## 2020-12-27 ENCOUNTER — Other Ambulatory Visit: Payer: Self-pay

## 2020-12-27 DIAGNOSIS — D72819 Decreased white blood cell count, unspecified: Secondary | ICD-10-CM | POA: Insufficient documentation

## 2020-12-27 DIAGNOSIS — J45909 Unspecified asthma, uncomplicated: Secondary | ICD-10-CM | POA: Insufficient documentation

## 2020-12-27 DIAGNOSIS — Z96643 Presence of artificial hip joint, bilateral: Secondary | ICD-10-CM | POA: Diagnosis not present

## 2020-12-27 DIAGNOSIS — Z20822 Contact with and (suspected) exposure to covid-19: Secondary | ICD-10-CM | POA: Diagnosis not present

## 2020-12-27 DIAGNOSIS — R0789 Other chest pain: Secondary | ICD-10-CM | POA: Diagnosis not present

## 2020-12-27 DIAGNOSIS — R079 Chest pain, unspecified: Secondary | ICD-10-CM | POA: Insufficient documentation

## 2020-12-27 DIAGNOSIS — R0602 Shortness of breath: Secondary | ICD-10-CM | POA: Diagnosis not present

## 2020-12-27 DIAGNOSIS — Z87891 Personal history of nicotine dependence: Secondary | ICD-10-CM | POA: Diagnosis not present

## 2020-12-27 DIAGNOSIS — Z79899 Other long term (current) drug therapy: Secondary | ICD-10-CM | POA: Diagnosis not present

## 2020-12-27 DIAGNOSIS — I10 Essential (primary) hypertension: Secondary | ICD-10-CM | POA: Insufficient documentation

## 2020-12-27 DIAGNOSIS — R61 Generalized hyperhidrosis: Secondary | ICD-10-CM | POA: Insufficient documentation

## 2020-12-27 DIAGNOSIS — R110 Nausea: Secondary | ICD-10-CM | POA: Insufficient documentation

## 2020-12-27 DIAGNOSIS — E875 Hyperkalemia: Secondary | ICD-10-CM | POA: Insufficient documentation

## 2020-12-27 DIAGNOSIS — D696 Thrombocytopenia, unspecified: Secondary | ICD-10-CM | POA: Diagnosis not present

## 2020-12-27 DIAGNOSIS — Z743 Need for continuous supervision: Secondary | ICD-10-CM | POA: Diagnosis not present

## 2020-12-27 LAB — CBC WITH DIFFERENTIAL/PLATELET
Abs Immature Granulocytes: 0 10*3/uL (ref 0.00–0.07)
Basophils Absolute: 0 10*3/uL (ref 0.0–0.1)
Basophils Relative: 0 %
Eosinophils Absolute: 0 10*3/uL (ref 0.0–0.5)
Eosinophils Relative: 1 %
HCT: 35.3 % — ABNORMAL LOW (ref 36.0–46.0)
Hemoglobin: 11.2 g/dL — ABNORMAL LOW (ref 12.0–15.0)
Immature Granulocytes: 0 %
Lymphocytes Relative: 59 %
Lymphs Abs: 1 10*3/uL (ref 0.7–4.0)
MCH: 28.6 pg (ref 26.0–34.0)
MCHC: 31.7 g/dL (ref 30.0–36.0)
MCV: 90.1 fL (ref 80.0–100.0)
Monocytes Absolute: 0.1 10*3/uL (ref 0.1–1.0)
Monocytes Relative: 4 %
Neutro Abs: 0.6 10*3/uL — ABNORMAL LOW (ref 1.7–7.7)
Neutrophils Relative %: 36 %
Platelets: 9 10*3/uL — CL (ref 150–400)
RBC: 3.92 MIL/uL (ref 3.87–5.11)
RDW: 14.2 % (ref 11.5–15.5)
WBC: 1.7 10*3/uL — ABNORMAL LOW (ref 4.0–10.5)
nRBC: 0 % (ref 0.0–0.2)

## 2020-12-27 LAB — BASIC METABOLIC PANEL
Anion gap: 7 (ref 5–15)
Anion gap: 6 (ref 5–15)
BUN: 14 mg/dL (ref 6–20)
BUN: 12 mg/dL (ref 6–20)
CO2: 20 mmol/L — ABNORMAL LOW (ref 22–32)
CO2: 26 mmol/L (ref 22–32)
Calcium: 8.7 mg/dL — ABNORMAL LOW (ref 8.9–10.3)
Calcium: 9.1 mg/dL (ref 8.9–10.3)
Chloride: 109 mmol/L (ref 98–111)
Chloride: 104 mmol/L (ref 98–111)
Creatinine, Ser: 0.67 mg/dL (ref 0.44–1.00)
Creatinine, Ser: 0.71 mg/dL (ref 0.44–1.00)
GFR, Estimated: >60 mL/min (ref >60)
GFR, Estimated: >60 mL/min (ref >60)
Glucose, Bld: 96 mg/dL (ref 70–99)
Glucose, Bld: 76 mg/dL (ref 70–99)
Potassium: 6 mmol/L — ABNORMAL HIGH (ref 3.5–5.1)
Potassium: 3.8 mmol/L (ref 3.5–5.1)
Sodium: 136 mmol/L (ref 135–145)
Sodium: 136 mmol/L (ref 135–145)

## 2020-12-27 LAB — CBC
HCT: 38 % (ref 36.0–46.0)
Hemoglobin: 12.4 g/dL (ref 12.0–15.0)
MCH: 28.6 pg (ref 26.0–34.0)
MCHC: 32.6 g/dL (ref 30.0–36.0)
MCV: 87.8 fL (ref 80.0–100.0)
Platelets: 228 10*3/uL (ref 150–400)
RBC: 4.33 MIL/uL (ref 3.87–5.11)
RDW: 14.4 % (ref 11.5–15.5)
WBC: 8.9 10*3/uL (ref 4.0–10.5)
nRBC: 0 % (ref 0.0–0.2)

## 2020-12-27 LAB — RESP PANEL BY RT-PCR (FLU A&B, COVID) ARPGX2
Influenza A by PCR: NEGATIVE
Influenza B by PCR: NEGATIVE
SARS Coronavirus 2 by RT PCR: NEGATIVE

## 2020-12-27 LAB — TROPONIN I (HIGH SENSITIVITY): Troponin I (High Sensitivity): 4 ng/L (ref <18)

## 2020-12-27 MED ORDER — FENTANYL CITRATE (PF) 100 MCG/2ML IJ SOLN
50.0000 ug | Freq: Once | INTRAMUSCULAR | Status: AC
  Administered 2020-12-27: 50 ug via INTRAVENOUS
  Filled 2020-12-27: qty 2

## 2020-12-27 MED ORDER — IOHEXOL 350 MG/ML SOLN
50.0000 mL | Freq: Once | INTRAVENOUS | Status: AC | PRN
  Administered 2020-12-27: 50 mL via INTRAVENOUS

## 2020-12-27 NOTE — ED Provider Notes (Signed)
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  CBC still pending.  Troponin is not elevated.   Patient would like to go home, does not want to wait for CBC to come back.  She is very well-appearing, not pale reporting signs of bleeding.  Do believe that CBC is erroneous.  Believe it is reasonable for her to be discharged.  We discussed symptomatic management and return precautions.  Recommended close PCP follow-up.  We will call her with CBC results.  I called patient at 95 PM to inform her that her repeat CBC was normal, no other acute issues at this time.      Suzan Nailer, DO 12/27/20 2315    Drenda Freeze, MD 12/31/20 [phone]

## 2020-12-27 NOTE — ED Provider Notes (Signed)
[facility] Provider Note   CSN: [id number] Arrival date & time: 12/27/20  1140     History Chief Complaint  Patient presents with  . Chest Pain    Patricia Davila is a 50 y.o. female.  HPI  Patient presents with chest pain.  Sudden onset.  Left-sided at the bottom of her ribs.  Worse with a deep breath.  Feels like it is difficult to take a deep breath.  She has had chest pain like this before with a previous pulmonary embolus back in 2020. 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 Took xarelto for a period but says she could not afford to continue it. No DVT sx lately and was asymptomatic prior to this event. Nauseous and was slightly sweaty earlier. No syncope or near-syncope. Emesis x1.   HPI: A 50 year old patient with a history of hypertension, hypercholesterolemia and obesity presents for evaluation of chest pain. Initial onset of pain was less than one hour ago. The patient's chest pain is not worse with exertion. The patient complains of nausea and reports some diaphoresis. The patient's chest pain is middle- or left-sided, is not well-localized, is not described as heaviness/pressure/tightness, is not sharp and does not radiate to the arms/jaw/neck. The patient has smoked in the past 90 days. The patient has no history of stroke, has no history of peripheral artery disease, denies any history of treated diabetes and has no relevant family history of coronary artery disease (first degree relative at less than age 29).   Past Medical History:  Diagnosis Date  . Anemia    low iron during pregnancy  . Anxiety   . Arthritis   . Asthma    as a child  . Bipolar disorder ([place])   . Depression   . GERD (gastroesophageal reflux disease)   . Headache   . Hypertension   . Left trimalleolar fracture   . Pneumonia    as a child  . PONV (postoperative nausea and vomiting)   . Restless legs     Patient Active Problem List   Diagnosis Date Noted   . Pulmonary embolism ([place]) 06/07/2019  . Left leg DVT ([place]) 06/07/2019  . Displaced trimalleolar fracture of left lower leg, subsequent encounter for closed fracture with delayed healing 03/17/2019  . Primary osteoarthritis of right hip 04/08/2017  . History of hip replacement 03/25/2017  . Primary osteoarthritis of left hip 12/22/2016  . Major depressive disorder 10/24/2012  . Smokes tobacco daily 05/13/2010  . Headache(784.0) 05/13/2010  . Microalbuminuria 04/16/2009  . GERD 11/22/2008  . Left hip pain 11/22/2008  . Bipolar 1 disorder, depressed ([place]) 05/17/2007  . Lumbosacral degenerative disk disease 03/23/2005  . Essential hypertension, benign 08/18/2003    Past Surgical History:  Procedure Laterality Date  . BILATERAL ANTERIOR TOTAL HIP ARTHROPLASTY Bilateral 03/25/2017   Procedure: BILATERAL ANTERIOR TOTAL HIP ARTHROPLASTY;  Surgeon: Leandrew Koyanagi, MD;  Location: [facility];  Service: Orthopedics;  Laterality: Bilateral;  . DENTAL SURGERY     all teeth extracted  . INTRAVASCULAR ULTRASOUND/IVUS N/A 06/09/2019   Procedure: INTRAVASCULAR ULTRASOUND/IVUS;  Surgeon: Elam Dutch, MD;  Location: [facility];  Service: Cardiovascular;  Laterality: N/A;  . LAMINECTOMY AND MICRODISCECTOMY LUMBAR SPINE   06/27/2010  . ORIF ANKLE FRACTURE Left 03/17/2019   Procedure: OPEN REDUCTION INTERNAL FIXATION (ORIF) LEFT ANKLE FRACTURE;  Surgeon: Leandrew Koyanagi, MD;  Location: [facility];  Service: Orthopedics;  Laterality: Left;  .  PERIPHERAL VASCULAR INTERVENTION Left 06/09/2019   Procedure: PERIPHERAL VASCULAR INTERVENTION;  Surgeon: Elam Dutch, MD;  Location: [facility];  Service: Cardiovascular;  Laterality: Left;  Common Iliac vein  . PERIPHERAL VASCULAR THROMBECTOMY Left 06/09/2019   Procedure: PERIPHERAL VASCULAR THROMBECTOMY;  Surgeon: Elam Dutch, MD;  Location: [facility];  Service: Cardiovascular;  Laterality: Left;  . TUBAL LIGATION  Bilateral      OB History   No obstetric history on file.     Family History  Problem Relation Age of Onset  . Colon cancer Mother   . HIV Mother   . Colon cancer Sister   . Breast cancer Neg Hx     Social History   Tobacco Use  . Smoking status: Former Smoker    Packs/day: 0.25    Types: Cigarettes  . Smokeless tobacco: Never Used  Vaping Use  . Vaping Use: Never used  Substance Use Topics  . Alcohol use: No  . Drug use: No    Home Medications Prior to Admission medications   Medication Sig Start Date End Date Taking? Authorizing Provider  amLODipine (NORVASC) 10 MG tablet Take 1 tablet (10 mg total) by mouth daily. 10/23/20  Yes Kerin Perna, NP  atorvastatin (LIPITOR) 10 MG tablet Take 1 tablet (10 mg total) by mouth daily. 10/23/20  Yes Kerin Perna, NP  Cholecalciferol (VITAMIN D3) 50 MCG (2000 UT) TABS TAKE 1 TABLET BY MOUTH EVERY DAY 08/14/19  Yes Kerin Perna, NP  cyclobenzaprine (FLEXERIL) 5 MG tablet TAKE 1 TO 2 TABLETS(5 TO 10 MG) BY MOUTH THREE TIMES DAILY AS NEEDED FOR MUSCLE SPASMS 12/09/20  Yes Aundra Dubin, PA-C  hydrochlorothiazide (HYDRODIURIL) 25 MG tablet TAKE 1 TABLET(25 MG) BY MOUTH DAILY IN THE MORNING Patient taking differently: Take 25 mg by mouth daily. 05/23/20  Yes Kerin Perna, NP  hydrOXYzine (VISTARIL) 25 MG capsule TAKE 1 CAPSULE(25 MG) BY MOUTH THREE TIMES DAILY AS NEEDED Patient taking differently: Take 25 mg by mouth every 8 (eight) hours as needed for anxiety or itching. 05/01/20  Yes Kerin Perna, NP  losartan (COZAAR) 50 MG tablet Take 1 tablet (50 mg total) by mouth daily. 10/23/20  Yes Kerin Perna, NP  naproxen (NAPROSYN) 500 MG tablet Take 500 mg by mouth daily.   Yes [provider]  SUMAtriptan (IMITREX) 25 MG tablet Take 1 tablet (25 mg total) by mouth 3 (three) times daily as needed for migraine. May repeat in 2 hours if headache persists or recurs. 11/10/20  Yes Kerin Perna,  NP  diclofenac (VOLTAREN) 75 MG EC tablet Take 1 tablet (75 mg total) by mouth 2 (two) times daily. Patient not taking: No sig reported 06/14/20   Leandrew Koyanagi, MD  omeprazole (PRILOSEC) 40 MG capsule TAKE 1 CAPSULE(40 MG) BY MOUTH DAILY Patient not taking: No sig reported 07/23/20   Kerin Perna, NP  oxybutynin (DITROPAN-XL) 5 MG 24 hr tablet Take 1 tablet (5 mg total) by mouth at bedtime. Patient not taking: No sig reported 10/23/20   Kerin Perna, NP    Allergies    Ativan [lorazepam], Anesthesia s-i-40 [propofol], Ditropan [oxybutynin], and Tylenol with codeine #3 [acetaminophen-codeine]  Review of Systems   Review of Systems  Constitutional: Negative for chills and fever.  HENT: Negative for ear pain and sore throat.   Eyes: Negative for pain and visual disturbance.  Respiratory: Positive for shortness of breath. Negative for cough.  Cardiovascular: Positive for chest pain. Negative for palpitations.  Gastrointestinal: Negative for abdominal pain and vomiting.  Genitourinary: Negative for dysuria and hematuria.  Musculoskeletal: Negative for arthralgias and back pain.  Skin: Negative for color change and rash.  Neurological: Negative for seizures and syncope.  All other systems reviewed and are negative.   Physical Exam Updated Vital Signs BP 115/82   Pulse 70   Temp 98 F (36.7 C) (Oral)   Resp 13   Ht 5\' 1"  (1.549 m)   Wt 81.6 kg   SpO2 97%   BMI 34.01 kg/m   Physical Exam Vitals and nursing note reviewed.  Constitutional:      Appearance: She is well-developed.  HENT:     Head: Normocephalic and atraumatic.  Eyes:     Conjunctiva/sclera: Conjunctivae normal.  Cardiovascular:     Rate and Rhythm: Normal rate and regular rhythm.     Heart sounds: No murmur heard.   Pulmonary:     Effort: Pulmonary effort is normal. No respiratory distress.     Breath sounds: Normal breath sounds. No decreased breath sounds or wheezing.  Abdominal:      Palpations: Abdomen is soft.     Tenderness: There is no abdominal tenderness.  Musculoskeletal:     Cervical back: Neck supple.     Right lower leg: No tenderness. No edema.     Left lower leg: No tenderness. No edema.  Skin:    General: Skin is warm and dry.  Neurological:     General: No focal deficit present.     Mental Status: She is alert.     Motor: No weakness.  Psychiatric:        Behavior: Behavior normal. Behavior is not agitated.     ED Results / Procedures / Treatments   Labs (all labs ordered are listed, but only abnormal results are displayed) Labs Reviewed  CBC WITH DIFFERENTIAL/PLATELET - Abnormal; Notable for the following components:      Result Value   WBC 1.7 (*)    Hemoglobin 11.2 (*)    HCT 35.3 (*)    Platelets 9 (*)    Neutro Abs 0.6 (*)    All other components within normal limits  BASIC METABOLIC PANEL - Abnormal; Notable for the following components:   Potassium 6.0 (*)    CO2 20 (*)    Calcium 8.7 (*)    All other components within normal limits  RESP PANEL BY RT-PCR (FLU A&B, COVID) ARPGX2  BASIC METABOLIC PANEL  CBC  TROPONIN I (HIGH SENSITIVITY)  TROPONIN I (HIGH SENSITIVITY)    EKG EKG Interpretation  Date/Time:  Friday Dec 27 2020 11:49:49 EDT Ventricular Rate:  65 PR Interval:  178 QRS Duration: 88 QT Interval:  521 QTC Calculation: 542 R Axis:   66 Text Interpretation: Sinus rhythm Low voltage, precordial leads Borderline T abnormalities, anterior leads Prolonged QT interval No significant change since last tracing EARLIER SAME DATE Confirmed by Calvert Cantor [phone]) on 12/27/2020 11:58:22 AM   Radiology CT ANGIO CHEST PE W OR WO CONTRAST  Result Date: 12/27/2020 CLINICAL DATA:  Chest pain started yesterday. Shortness of breath. History of blood clots. EXAM: CT ANGIOGRAPHY CHEST WITH CONTRAST TECHNIQUE: Multidetector CT imaging of the chest was performed using the standard protocol during bolus administration of  intravenous contrast. Multiplanar CT image reconstructions and MIPs were obtained to evaluate the vascular anatomy. CONTRAST:  38mL OMNIPAQUE IOHEXOL 350 MG/ML SOLN COMPARISON:  CT angio chest on 06/07/2019 FINDINGS:  Cardiovascular: Heart size is normal. There is no pericardial effusion. Pulmonary arteries are well opacified and there is no acute pulmonary embolus. Thoracic aorta is partially calcified not associated with aneurysm or dissection. Mediastinum/Nodes: The visualized portion of the thyroid gland has a normal appearance. No significant mediastinal, hilar, or axillary adenopathy. Esophagus is unremarkable. Lungs/Pleura: Dependent changes in both lung bases. No acute pulmonary abnormality. Upper Abdomen: Gallbladder is present. Musculoskeletal: No chest wall abnormality. No acute or significant osseous findings. Review of the MIP images confirms the above findings. IMPRESSION: Technically adequate exam showing no acute pulmonary embolus. No acute abnormality of the chest. Electronically Signed   By: Nolon Nations M.D.   On: 12/27/2020 15:22   DG Chest Port 1 View  Result Date: 12/27/2020 CLINICAL DATA:  Chest pain.  Pain with deep breath. EXAM: PORTABLE CHEST 1 VIEW COMPARISON:  Two-view chest x-ray 12/13/2019 FINDINGS: Heart size is exaggerate by low lung volumes. No edema or effusion is present. No focal airspace disease is present. The visualized soft tissues and bony thorax are unremarkable. IMPRESSION: 1. Low lung volumes. 2. No acute cardiopulmonary disease. Electronically Signed   By: San Morelle M.D.   On: 12/27/2020 12:52    Procedures Procedures   Medications Ordered in ED Medications  fentaNYL (SUBLIMAZE) injection 50 mcg (50 mcg Intravenous Given 12/27/20 1225)  iohexol (OMNIPAQUE) 350 MG/ML injection 50 mL (50 mLs Intravenous Contrast Given 12/27/20 1503)    ED Course  I have reviewed the triage vital signs and the nursing notes.  Pertinent labs & imaging results  that were available during my care of the patient were reviewed by me and considered in my medical decision making (see chart for details).    MDM Rules/Calculators/A&P HEAR Score: 5                        Patient presents with chest pain and trouble breathing.  Very high risk for PE based on history.  ACS also considered and hear score is 5.  Hemodynamically stable submassive PE is not present.  Does not radiate to back and dissection is less likely.  Plan is for ACS work-up as well as CT PE.  EKG was obtained and reviewed by me; sinus rhythm, no conduction delays, T wave inversions in V2 through V5 that were not present previously.  No ST changes.  Labs result with hemolysis, hyperkalemia at 6.0 with hemolysis, significant leukopenia and thrombocytopenia.  The nurse endorses there is very little blood in the tube and I suspect an errant lab draw.  Labs were redrawn.  I was able to evaluate the CT PE prior to handoff although there is no radiology read and I did not note any acute thromboembolic phenomenon.  Care was handed off to Dr. Miki Kins at 1500 and repeat labs are pending.  Final Clinical Impression(s) / ED Diagnoses Final diagnoses:  Chest pain    Rx / DC Orders ED Discharge Orders    None       Aris Lot, MD 12/27/20 Dionicia Abler    Truddie Hidden, MD 12/28/20 [phone]

## 2020-12-27 NOTE — ED Triage Notes (Signed)
Pt bib ems from home with c/o chest pain. Pt state since yesterday had chest pain and sob. "It hurts when taking a deep breath."  Pt denies history of cardiac issues. Pt has hx of blood clots.  Pt was given zofran and nitroglycerin with ems. Pt took 324 aspirin before ems arrving.  Hr 90 BP 105/70 after nitro Room Air 100

## 2021-01-01 NOTE — Progress Notes (Signed)
[facility]ASC[facility]           OF [place]  History and Physical   Patricia Davila is a 50 y.o. female who presents with hx of extensive left leg DVT and PE in 2020.  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  Clot extended through the femoral vein, popliteal vein and tibial veins.  She denied any prior history of venous thrombosis.  She reported that her maternal grandmother had DVT at an older age.  She had recently had ankle surgery for ankle fracture approximately 3 months prior.  She presented with leg swelling and shortness of breath.  CT scan confirmed pulmonary embolus.  She was admitted for IV heparin and was being seen to consider more aggressive clearing of her left leg thrombus.  On 06/09/2019, she was taken for Superior venacavogram, lower extremity venogram, IVUS inferior vena cava, and external femoral popliteal vein, left common iliac venous stent (15 x 9 V ICI), mechanical thrombectomy for left iliofemoral DVT, May Thurner Syndrome.   She was seen back in the office a month later and at that time, the CIV stent was difficult to visualize due to overlying bowel gas.  The IVC and CIV's were thought to be patent.  A portion of the EIV stent was also patent.  She was doing well with minimal swelling.  She was to continue Xarelto for a total of 6 months and then re-evaluate with repeat non invasive studies.  She has not been seen in our office since November 2020.   She presented to the ER last week with chest pain that was sudden in onset that was left sided at the bottom of her ribs and was worse with deep breathing.  She stated she had CP like this before when she had the PE in 2020.  She was no longer taking Xarelto as she could not afford it.  She had an episode of nausea and sweating.    She did have a CT and it was negative for PE.  Her EKG did have some T wave inversions but no acute  changes.  She was discharged home.    She states that she is doing better from last week and that she had pulled a muscle.    She has continued leg swelling in both legs but the left is worse than the right.  She does have some numbness in her thighs and lower legs when she sits for a long period of time.  She has hx of back surgeries as well as bilateral hip surgeries and left ankle surgery.   She states that she does not get cramping in her legs when she walks.  She does wear compression on the left leg.  She does take a daily asa.  She has not taken the Xarelto in quite some time.  She does not elevate her legs.    The pt is on a statin for cholesterol management.  The pt is on a daily aspirin.   Other AC:  none The pt is on ARB, CCB for hypertension.   The pt is not diabetic.   Tobacco hx:  former   Past Medical History:  Diagnosis Date  . Anemia    low iron during pregnancy  . Anxiety   . Arthritis   . Asthma    as a child  . Bipolar disorder (  [place])   . Depression   . GERD (gastroesophageal reflux disease)   . Headache   . Hypertension   . Left trimalleolar fracture   . Pneumonia    as a child  . PONV (postoperative nausea and vomiting)   . Restless legs     Past Surgical History:  Procedure Laterality Date  . BILATERAL ANTERIOR TOTAL HIP ARTHROPLASTY Bilateral 03/25/2017   Procedure: BILATERAL ANTERIOR TOTAL HIP ARTHROPLASTY;  Surgeon: Leandrew Koyanagi, MD;  Location: [facility];  Service: Orthopedics;  Laterality: Bilateral;  . DENTAL SURGERY     all teeth extracted  . INTRAVASCULAR ULTRASOUND/IVUS N/A 06/09/2019   Procedure: INTRAVASCULAR ULTRASOUND/IVUS;  Surgeon: Elam Dutch, MD;  Location: [facility];  Service: Cardiovascular;  Laterality: N/A;  . LAMINECTOMY AND MICRODISCECTOMY LUMBAR SPINE   06/27/2010  . ORIF ANKLE FRACTURE Left 03/17/2019   Procedure: OPEN REDUCTION INTERNAL FIXATION (ORIF) LEFT ANKLE FRACTURE;  Surgeon: Leandrew Koyanagi, MD;  Location: [facility];  Service: Orthopedics;  Laterality: Left;  . PERIPHERAL VASCULAR INTERVENTION Left 06/09/2019   Procedure: PERIPHERAL VASCULAR INTERVENTION;  Surgeon: Elam Dutch, MD;  Location: [facility];  Service: Cardiovascular;  Laterality: Left;  Common Iliac vein  . PERIPHERAL VASCULAR THROMBECTOMY Left 06/09/2019   Procedure: PERIPHERAL VASCULAR THROMBECTOMY;  Surgeon: Elam Dutch, MD;  Location: [facility];  Service: Cardiovascular;  Laterality: Left;  . TUBAL LIGATION Bilateral     Social History   Socioeconomic History  . Marital status: Married    Spouse name: Not on file  . Number of children: Not on file  . Years of education: Not on file  . Highest education level: Not on file  Occupational History  . Not on file  Tobacco Use  . Smoking status: Former Smoker    Packs/day: 0.25    Types: Cigarettes  . Smokeless tobacco: Never Used  Vaping Use  . Vaping Use: Never used  Substance and Sexual Activity  . Alcohol use: No  . Drug use: No  . Sexual activity: Yes    Birth control/protection: Surgical    Comment: BTL  Other Topics Concern  . Not on file  Social History Narrative  . Not on file   Social Determinants of Health   Financial Resource Strain: Not on file  Food Insecurity: Not on file  Transportation Needs: Not on file  Physical Activity: Not on file  Stress: Not on file  Social Connections: Not on file  Intimate Partner Violence: Not on file     Family History  Problem Relation Age of Onset  . Colon cancer Mother   . HIV Mother   . Colon cancer Sister   . Breast cancer Neg Hx     Current Outpatient Medications  Medication Sig Dispense Refill  . amLODipine (NORVASC) 10 MG tablet Take 1 tablet (10 mg total) by mouth daily. 90 tablet 3  . atorvastatin (LIPITOR) 10 MG tablet Take 1 tablet (10 mg total) by mouth daily. 90 tablet 3  . Cholecalciferol (VITAMIN D3) 50 MCG (2000 UT) TABS TAKE 1 TABLET BY MOUTH EVERY  DAY 30 tablet 2  . cyclobenzaprine (FLEXERIL) 5 MG tablet TAKE 1 TO 2 TABLETS(5 TO 10 MG) BY MOUTH THREE TIMES DAILY AS NEEDED FOR MUSCLE SPASMS 30 tablet 3  . diclofenac (VOLTAREN) 75 MG EC tablet Take 1 tablet (75 mg total) by mouth 2 (two) times daily. (Patient not taking: No sig reported) 30 tablet  2  . hydrochlorothiazide (HYDRODIURIL) 25 MG tablet TAKE 1 TABLET(25 MG) BY MOUTH DAILY IN THE MORNING (Patient taking differently: Take 25 mg by mouth daily.) 90 tablet 1  . hydrOXYzine (VISTARIL) 25 MG capsule TAKE 1 CAPSULE(25 MG) BY MOUTH THREE TIMES DAILY AS NEEDED (Patient taking differently: Take 25 mg by mouth every 8 (eight) hours as needed for anxiety or itching.) 60 capsule 1  . losartan (COZAAR) 50 MG tablet Take 1 tablet (50 mg total) by mouth daily. 90 tablet 1  . naproxen (NAPROSYN) 500 MG tablet Take 500 mg by mouth daily.    [name] omeprazole (PRILOSEC) 40 MG capsule TAKE 1 CAPSULE(40 MG) BY MOUTH DAILY (Patient not taking: No sig reported) 90 capsule 3  . oxybutynin (DITROPAN-XL) 5 MG 24 hr tablet Take 1 tablet (5 mg total) by mouth at bedtime. (Patient not taking: No sig reported) 90 tablet 1  . SUMAtriptan (IMITREX) 25 MG tablet Take 1 tablet (25 mg total) by mouth 3 (three) times daily as needed for migraine. May repeat in 2 hours if headache persists or recurs. 10 tablet 0   No current facility-administered medications for this visit.    Allergies  Allergen Reactions  . Ativan [Lorazepam] Anxiety    HALLUCINATIONS  . Anesthesia S-I-40 [Propofol]     hallucinations  . Ditropan [Oxybutynin]     Anxiety  . Tylenol With Codeine #3 [Acetaminophen-Codeine] Hives and Itching    REVIEW OF SYSTEMS:   [X]  denotes positive finding, [ ]  denotes negative finding Cardiac  Comments:  Chest pain or chest pressure:    Shortness of breath upon exertion:    Short of breath when lying flat:    Irregular heart rhythm:        Vascular    Pain in calf, thigh, or hip brought on by  ambulation:    Pain in feet at night that wakes you up from your sleep:     Blood clot in your veins: x History of  Leg swelling:  x       Pulmonary    Oxygen at home:    Productive cough:     Wheezing:         Neurologic    Sudden weakness in arms or legs:     Sudden numbness in arms or legs:     Sudden onset of difficulty speaking or slurred speech:    Temporary loss of vision in one eye:     Problems with dizziness:         Gastrointestinal    Blood in stool:     Vomited blood:         Genitourinary    Burning when urinating:     Blood in urine:        Psychiatric    Major depression:         Hematologic    Bleeding problems:    Problems with blood clotting too easily:        Skin    Rashes or ulcers:        Constitutional    Fever or chills:      PHYSICAL EXAMINATION:  Today's Vitals   01/02/21 0920  BP: 111/83  Pulse: 74  Resp: 20  Temp: 98.6 F (37 C)  TempSrc: Temporal  SpO2: 98%  Weight: 212 lb 3.2 oz (96.3 kg)  Height: 5\' 1"  (1.549 m)  PainSc: 3   PainLoc: Leg   Body mass index is 40.09 kg/m.  General:  WDWN in NAD; vital signs documented above Gait: Not observed HENT: WNL, normocephalic Pulmonary: normal non-labored breathing without wheezing Cardiac: regular HR; without carotid bruits Abdomen: soft, NT, no masses; aortic pulse is not palpable Skin: without rashes Vascular Exam/Pulses:  Right Left  Radial 2+ (normal) 2+ (normal)  DP 2+ (normal) 2+ (normal)  PT 2+ (normal) 2+ (normal)   Extremities: without ischemic changes, without cellulitis; without open wounds; she does have swelling in BLE with left worse than right Musculoskeletal: no muscle wasting or atrophy  Neurologic: A&O X 3;  moving all extremities equally Psychiatric:  The pt has Normal affect.   Non-Invasive Vascular Imaging:   Venous duplex on 01/02/2021: IVC/Iliac Findings:  +----------+------+--------+--------------+    IVC  PatentThrombus  Comments     +----------+------+--------+--------------+  IVC Mid         not visualized  +----------+------+--------+--------------+  IVC Distal       not visualized  +----------+------+--------+--------------+   +-------------------+---------+-----------+---------+-----------+--------+      CIV    RT-PatentRT-ThrombusLT-PatentLT-ThrombusComments  +-------------------+---------+-----------+---------+-----------+--------+  Common Iliac Prox             patent             +-------------------+---------+-----------+---------+-----------+--------+  Common Iliac Mid              patent             +-------------------+---------+-----------+---------+-----------+--------+  Common Iliac Distal            patent             +-------------------+---------+-----------+---------+-----------+--------+   +------------------------+---------+-----------+---------+-----------+-----       EIV       RT-PatentRT-ThrombusLT-PatentLT-ThrombusComments  +------------------------+---------+-----------+---------+-----------+-----  External Iliac Vein Prox            patent           +------------------------+---------+-----------+---------+-----------+-----  External Iliac Vein Mid             patent           +------------------------+---------+-----------+---------+-----------+-----  Summary:  IVC/Iliac: Visualization of proximal Inferior Vena Cava, mid inferior vena  cava, distal Inferior Vena Cava and mid common Iliac was limited.  Patent left common iliac vein stenting where visualized.  Patent external iliac vein.   Patricia Davila is a 50 y.o. female who presents with hx of DVT/PE and Superior venacavogram, lower extremity venogram, IVUS inferior vena cava, and external femoral popliteal vein, left common iliac venous stent (15 x 9 V  ICI), mechanical thrombectomy for left iliofemoral DVT, May Thurner Syndrome on 06/09/2019 by Dr. Oneida Alar.  Recently presented to ED for Chest pain and CT was negative for PE.    Pt with continued BLE swelling with left greater than right.   She does not have any evidence of DVT and her iliac vein stent is patent.  She does have palpable pedal pulses bilaterally.   -discussed with pt about continuing to wear her compression stockings  -discussed the importance of leg elevation and how to elevate properly - pt is advised to elevate their legs and a diagram is given to them to demonstrate to lay flat on their back with knees elevated and slightly bent with their feet higher than her knees, which puts their feet higher than their heart for 15 minutes per day.  If they cannot lay flat, advised to lay as flat as possible.  -pt is advised to continue as much walking as possible and avoid sitting or standing for long periods of time.  -  discussed importance of weight loss and exercise and that water aerobics would also be beneficial.  -handout with recommendations given -she is going to make an appt with her spine surgeon for the numbness in her legs.  Discussed with her that there are many reasons for leg swelling.  A venous reflux study was not done today.  I discussed with pt that if this worsens, we can bring her back and do a reflux study.  Otherwise, she will f/u in one year with iliac/IVC duplex -she will continue daily asa.    Leontine Locket, [facility] 01/02/2021 7:58 AM  Clinic MD:  Scot Dock

## 2021-01-02 ENCOUNTER — Ambulatory Visit (INDEPENDENT_AMBULATORY_CARE_PROVIDER_SITE_OTHER): Payer: Medicare Other | Admitting: Physician Assistant

## 2021-01-02 ENCOUNTER — Ambulatory Visit (HOSPITAL_COMMUNITY)
Admission: RE | Admit: 2021-01-02 | Discharge: 2021-01-02 | Disposition: A | Discharge status: Home / Self Care | Payer: Medicare Other | Source: Ambulatory Visit | Attending: Vascular Surgery | Admitting: Vascular Surgery

## 2021-01-02 ENCOUNTER — Other Ambulatory Visit: Payer: Self-pay

## 2021-01-02 VITALS — BP 111/83 | HR 74 | Temp 98.6°F | Resp 20 | Ht 61.0 in | Wt 212.2 lb

## 2021-01-02 DIAGNOSIS — I871 Compression of vein: Secondary | ICD-10-CM | POA: Diagnosis not present

## 2021-01-02 DIAGNOSIS — I82402 Acute embolism and thrombosis of unspecified deep veins of left lower extremity: Secondary | ICD-10-CM | POA: Diagnosis not present

## 2021-01-02 DIAGNOSIS — Z20822 Contact with and (suspected) exposure to covid-19: Secondary | ICD-10-CM | POA: Diagnosis not present

## 2021-01-02 DIAGNOSIS — Z03818 Encounter for observation for suspected exposure to other biological agents ruled out: Secondary | ICD-10-CM | POA: Diagnosis not present

## 2021-01-08 DIAGNOSIS — Z20822 Contact with and (suspected) exposure to covid-19: Secondary | ICD-10-CM | POA: Diagnosis not present

## 2021-01-08 DIAGNOSIS — Z03818 Encounter for observation for suspected exposure to other biological agents ruled out: Secondary | ICD-10-CM | POA: Diagnosis not present

## 2021-01-14 ENCOUNTER — Other Ambulatory Visit (INDEPENDENT_AMBULATORY_CARE_PROVIDER_SITE_OTHER): Payer: Self-pay | Admitting: Primary Care

## 2021-01-14 NOTE — Telephone Encounter (Signed)
Requested Prescriptions  Pending Prescriptions Disp Refills  . SUMAtriptan (IMITREX) 25 MG tablet [Pharmacy Med Name: SUMATRIPTAN 25MG  TABLETS] 10 tablet 0    Sig: TAKE 1 TABLET BY MOUTH EVERY 2 HOURS AS NEEDED FOR MIGRAINE. MAY REPEAT IN 2 HOURS AS NEEDED     Neurology:  Migraine Therapy - Triptan Passed - 01/14/2021  1:02 PM      Passed - Last BP in normal range    BP Readings from Last 1 Encounters:  01/02/21 111/83         Passed - Valid encounter within last 12 months    Recent Outpatient Visits          2 months ago Annual physical exam   [place], Michelle P, NP   7 months ago Hypertension, unspecified type   [place], [place], NP   1 year ago Vaginal discharge   [place], Michelle P, NP   1 year ago Class 2 severe obesity due to excess calories with serious comorbidity in adult, unspecified BMI ([place])   [place], Michelle P, NP   1 year ago Hypertension, unspecified type   [place] Kerin Perna, NP

## 2021-01-16 DIAGNOSIS — Z20822 Contact with and (suspected) exposure to covid-19: Secondary | ICD-10-CM | POA: Diagnosis not present

## 2021-01-16 DIAGNOSIS — Z03818 Encounter for observation for suspected exposure to other biological agents ruled out: Secondary | ICD-10-CM | POA: Diagnosis not present

## 2021-01-22 DIAGNOSIS — Z03818 Encounter for observation for suspected exposure to other biological agents ruled out: Secondary | ICD-10-CM | POA: Diagnosis not present

## 2021-01-22 DIAGNOSIS — Z20822 Contact with and (suspected) exposure to covid-19: Secondary | ICD-10-CM | POA: Diagnosis not present

## 2021-02-01 ENCOUNTER — Other Ambulatory Visit (INDEPENDENT_AMBULATORY_CARE_PROVIDER_SITE_OTHER): Payer: Self-pay | Admitting: Primary Care

## 2021-02-01 DIAGNOSIS — Z760 Encounter for issue of repeat prescription: Secondary | ICD-10-CM

## 2021-02-01 DIAGNOSIS — N3946 Mixed incontinence: Secondary | ICD-10-CM

## 2021-02-01 DIAGNOSIS — I10 Essential (primary) hypertension: Secondary | ICD-10-CM

## 2021-02-01 NOTE — Telephone Encounter (Signed)
Requested Prescriptions  Pending Prescriptions Disp Refills  . hydrochlorothiazide (HYDRODIURIL) 25 MG tablet [Pharmacy Med Name: HYDROCHLOROTHIAZIDE 25MG  TABLETS] 90 tablet 1    Sig: TAKE 1 TABLET(25 MG) BY MOUTH DAILY IN THE MORNING     Cardiovascular: Diuretics - Thiazide Passed - 02/01/2021 10:58 AM      Passed - Ca in normal range and within 360 days    Calcium  Date Value Ref Range Status  12/27/2020 9.1 8.9 - 10.3 mg/dL Final   Calcium, Ion  Date Value Ref Range Status  12/10/2014 1.06 (L) 1.12 - 1.23 mmol/L Final         Passed - Cr in normal range and within 360 days    Creatinine, Ser  Date Value Ref Range Status  12/27/2020 0.71 0.44 - 1.00 mg/dL Final   Creatinine,U  Date Value Ref Range Status  11/23/2008 145.6 mg/dL Final    Comment:    See lab report for associated comment(s)         Passed - K in normal range and within 360 days    Potassium  Date Value Ref Range Status  12/27/2020 3.8 3.5 - 5.1 mmol/L Final    Comment:    DELTA CHECK NOTED         Passed - Na in normal range and within 360 days    Sodium  Date Value Ref Range Status  12/27/2020 136 135 - 145 mmol/L Final  05/23/2020 142 134 - 144 mmol/L Final         Passed - Last BP in normal range    BP Readings from Last 1 Encounters:  01/02/21 111/83         Passed - Valid encounter within last 6 months    Recent Outpatient Visits          3 months ago Annual physical exam   [place], Michelle P, NP   8 months ago Hypertension, unspecified type   [place] Kerin Perna, NP   1 year ago Vaginal discharge   [place], Michelle P, NP   1 year ago Class 2 severe obesity due to excess calories with serious comorbidity in adult, unspecified BMI ([place])   [place] [facility] Kerin Perna, NP   1 year ago Hypertension, unspecified type   [place], Michelle P, NP             . oxybutynin (DITROPAN-XL) 5 MG 24 hr tablet [Pharmacy Med Name: OXYBUTYNIN ER 5MG  TABLETS] 90 tablet 1    Sig: TAKE 1 TABLET(5 MG) BY MOUTH AT BEDTIME     Urology:  Bladder Agents Passed - 02/01/2021 10:58 AM      Passed - Valid encounter within last 12 months    Recent Outpatient Visits          3 months ago Annual physical exam   [place] Kerin Perna, NP   8 months ago Hypertension, unspecified type   [place] Kerin Perna, NP   1 year ago Vaginal discharge   [place] Kerin Perna, NP   1 year ago Class 2 severe obesity due to excess calories with serious comorbidity in adult, unspecified BMI ([place])   [facility] Kerin Perna, NP   1 year ago Hypertension, unspecified type   [facility]  [facility] Juluis Mire P, NP             . SUMAtriptan (IMITREX) 25 MG tablet [Pharmacy Med Name: SUMATRIPTAN 25MG  TABLETS] 10 tablet 0    Sig: TAKE 1 TABLET BY MOUTH AT ONSET OF MIGRAINE AS NEEDED. MAY REPEAT IN 2 HOURS AS NEEDED     Neurology:  Migraine Therapy - Triptan Passed - 02/01/2021 10:58 AM      Passed - Last BP in normal range    BP Readings from Last 1 Encounters:  01/02/21 111/83         Passed - Valid encounter within last 12 months    Recent Outpatient Visits          3 months ago Annual physical exam   [place], [place], NP   8 months ago Hypertension, unspecified type   [place] Kerin Perna, NP   1 year ago Vaginal discharge   [place] Kerin Perna, NP   1 year ago Class 2 severe obesity due to excess calories with serious comorbidity in adult, unspecified BMI ([place])   [place] Kerin Perna, NP   1 year ago Hypertension, unspecified type   [place] Kerin Perna, NP

## 2021-02-06 DIAGNOSIS — Z20822 Contact with and (suspected) exposure to covid-19: Secondary | ICD-10-CM | POA: Diagnosis not present

## 2021-02-06 DIAGNOSIS — Z03818 Encounter for observation for suspected exposure to other biological agents ruled out: Secondary | ICD-10-CM | POA: Diagnosis not present

## 2021-02-14 DIAGNOSIS — Z03818 Encounter for observation for suspected exposure to other biological agents ruled out: Secondary | ICD-10-CM | POA: Diagnosis not present

## 2021-02-14 DIAGNOSIS — Z20822 Contact with and (suspected) exposure to covid-19: Secondary | ICD-10-CM | POA: Diagnosis not present

## 2021-02-24 DIAGNOSIS — Z20822 Contact with and (suspected) exposure to covid-19: Secondary | ICD-10-CM | POA: Diagnosis not present

## 2021-02-24 DIAGNOSIS — Z03818 Encounter for observation for suspected exposure to other biological agents ruled out: Secondary | ICD-10-CM | POA: Diagnosis not present

## 2021-02-26 DIAGNOSIS — H40013 Open angle with borderline findings, low risk, bilateral: Secondary | ICD-10-CM | POA: Diagnosis not present

## 2021-02-26 DIAGNOSIS — H04123 Dry eye syndrome of bilateral lacrimal glands: Secondary | ICD-10-CM | POA: Diagnosis not present

## 2021-02-26 DIAGNOSIS — H11133 Conjunctival pigmentations, bilateral: Secondary | ICD-10-CM | POA: Diagnosis not present

## 2021-03-06 ENCOUNTER — Ambulatory Visit (INDEPENDENT_AMBULATORY_CARE_PROVIDER_SITE_OTHER): Payer: Medicare Other | Admitting: Primary Care

## 2021-04-17 ENCOUNTER — Ambulatory Visit (INDEPENDENT_AMBULATORY_CARE_PROVIDER_SITE_OTHER): Payer: Medicare Other | Admitting: Primary Care

## 2021-04-23 ENCOUNTER — Ambulatory Visit (INDEPENDENT_AMBULATORY_CARE_PROVIDER_SITE_OTHER): Payer: Medicare Other

## 2021-04-23 DIAGNOSIS — Z0000 Encounter for general adult medical examination without abnormal findings: Secondary | ICD-10-CM

## 2021-04-23 NOTE — Patient Instructions (Signed)
Colonoscopy, Adult A colonoscopy is a procedure to look at the entire large intestine. This procedure is done using a long, thin, flexible tube that has a camera on the end. You may have a colonoscopy: As a part of normal colorectal screening. If you have certain symptoms, such as: A low number of red blood cells in your blood (anemia). Diarrhea that does not go away. Pain in your abdomen. Blood in your stool. A colonoscopy can help screen for and diagnose medical problems, including: Tumors. Extra tissue that grows where mucus forms (polyps). Inflammation. Areas of bleeding. Tell your health care provider about: Any allergies you have. All medicines you are taking, including vitamins, herbs, eye drops, creams, and over-the-counter medicines. Any problems you or family members have had with anesthetic medicines. Any blood disorders you have. Any surgeries you have had. Any medical conditions you have. Any problems you have had with having bowel movements. Whether you are pregnant or may be pregnant. What are the risks? Generally, this is a safe procedure. However, problems may occur, including: Bleeding. Damage to your intestine. Allergic reactions to medicines given during the procedure. Infection. This is rare. What happens before the procedure? Eating and drinking restrictions Follow instructions from your health care provider about eating or drinking restrictions, which may include: A few days before the procedure: Follow a low-fiber diet. Avoid nuts, seeds, dried fruit, raw fruits, and vegetables. 1-3 days before the procedure: Eat only gelatin dessert or ice pops. Drink only clear liquids, such as water, clear juice, clear broth or bouillon, black coffee or tea, or clear soft drinks or sports drinks. Avoid liquids that contain red or purple dye. The day of the procedure: Do not eat solid foods. You may continue to drink clear liquids until up to 2 hours before the  procedure. Do not eat or drink anything starting 2 hours before the procedure, or within the time period that your health care provider recommends. Bowel prep If you were prescribed a bowel prep to take by mouth (orally) to clean out your colon: Take it as told by your health care provider. Starting the day before your procedure, you will need to drink a large amount of liquid medicine. The liquid will cause you to have many bowel movements of loose stool until your stool becomes almost clear or light green. If your skin or the opening between the buttocks (anus) gets irritated from diarrhea, you may relieve the irritation using: Wipes with medicine in them, such as adult wet wipes with aloe and vitamin E. A product to soothe skin, such as petroleum jelly. If you vomit while drinking the bowel prep: Take a break for up to 60 minutes. Begin the bowel prep again. Call your health care provider if you keep vomiting or you cannot take the bowel prep without vomiting. To clean out your colon, you may also be given: Laxative medicines. These help you have a bowel movement. Instructions for enema use. An enema is liquid medicine injected into your rectum. Medicines Ask your health care provider about: Changing or stopping your regular medicines or supplements. This is especially important if you are taking iron supplements, diabetes medicines, or blood thinners. Taking medicines such as aspirin and ibuprofen. These medicines can thin your blood. Do not take these medicines unless your health care provider tells you to take them. Taking over-the-counter medicines, vitamins, herbs, and supplements. General instructions Ask your health care provider what steps will be taken to help prevent infection. These may include  washing skin with a germ-killing soap. Plan to have someone take you home from the hospital or clinic. What happens during the procedure?  An IV will be inserted into one of your  veins. You may be given one or more of the following: A medicine to help you relax (sedative). A medicine to numb the area (local anesthetic). A medicine to make you fall asleep (general anesthetic). This is rarely needed. You will lie on your side with your knees bent. The tube will: Have oil or gel put on it (be lubricated). Be inserted into your anus. Be gently eased through all parts of your large intestine. Air will be sent into your colon to keep it open. This may cause some pressure or cramping. Images will be taken with the camera and will appear on a screen. A small tissue sample may be removed to be looked at under a microscope (biopsy). The tissue may be sent to a lab for testing if any signs of problems are found. If small polyps are found, they may be removed and checked for cancer cells. When the procedure is finished, the tube will be removed. The procedure may vary among health care providers and hospitals. What happens after the procedure? Your blood pressure, heart rate, breathing rate, and blood oxygen level will be monitored until you leave the hospital or clinic. You may have a small amount of blood in your stool. You may pass gas and have mild cramping or bloating in your abdomen. This is caused by the air that was used to open your colon during the exam. Do not drive for 24 hours after the procedure. It is up to you to get the results of your procedure. Ask your health care provider, or the department that is doing the procedure, when your results will be ready. Summary A colonoscopy is a procedure to look at the entire large intestine. Follow instructions from your health care provider about eating and drinking before the procedure. If you were prescribed an oral bowel prep to clean out your colon, take it as told by your health care provider. During the colonoscopy, a flexible tube with a camera on its end is inserted into the anus and then passed into the other  parts of the large intestine. This information is not intended to replace advice given to you by your health care provider. Make sure you discuss any questions you have with your health care provider. Document Revised: 02/24/2019 Document Reviewed: 02/24/2019 Elsevier Patient Education  [place] Prevention in the Home, Adult Falls can cause injuries and can happen to people of all ages. There are many things you can do to make your home safe and to help prevent falls. Ask for help when making these changes. What actions can I take to prevent falls? General Instructions Use good lighting in all rooms. Replace any light bulbs that burn out. Turn on the lights in dark areas. Use night-lights. Keep items that you use often in easy-to-reach places. Lower the shelves around your home if needed. Set up your furniture so you have a clear path. Avoid moving your furniture around. Do not have throw rugs or other things on the floor that can make you trip. Avoid walking on wet floors. If any of your floors are uneven, fix them. Add color or contrast paint or tape to clearly mark and help you see: Grab bars or handrails. First and last steps of staircases. Where the edge of each step  is. If you use a stepladder: Make sure that it is fully opened. Do not climb a closed stepladder. Make sure the sides of the stepladder are locked in place. Ask someone to hold the stepladder while you use it. Know where your pets are when moving through your home. What can I do in the bathroom?   Keep the floor dry. Clean up any water on the floor right away. Remove soap buildup in the tub or shower. Use nonskid mats or decals on the floor of the tub or shower. Attach bath mats securely with double-sided, nonslip rug tape. If you need to sit down in the shower, use a plastic, nonslip stool. Install grab bars by the toilet and in the tub and shower. Do not use towel bars as grab bars. What can I do  in the bedroom? Make sure that you have a light by your bed that is easy to reach. Do not use any sheets or blankets for your bed that hang to the floor. Have a firm chair with side arms that you can use for support when you get dressed. What can I do in the kitchen? Clean up any spills right away. If you need to reach something above you, use a step stool with a grab bar. Keep electrical cords out of the way. Do not use floor polish or wax that makes floors slippery. What can I do with my stairs? Do not leave any items on the stairs. Make sure that you have a light switch at the top and the bottom of the stairs. Make sure that there are handrails on both sides of the stairs. Fix handrails that are broken or loose. Install nonslip stair treads on all your stairs. Avoid having throw rugs at the top or bottom of the stairs. Choose a carpet that does not hide the edge of the steps on the stairs. Check carpeting to make sure that it is firmly attached to the stairs. Fix carpet that is loose or worn. What can I do on the outside of my home? Use bright outdoor lighting. Fix the edges of walkways and driveways and fix any cracks. Remove anything that might make you trip as you walk through a door, such as a raised step or threshold. Trim any bushes or trees on paths to your home. Check to see if handrails are loose or broken and that both sides of all steps have handrails. Install guardrails along the edges of any raised decks and porches. Clear paths of anything that can make you trip, such as tools or rocks. Have leaves, snow, or ice cleared regularly. Use sand or salt on paths during winter. Clean up any spills in your garage right away. This includes grease or oil spills. What other actions can I take? Wear shoes that: Have a low heel. Do not wear high heels. Have rubber bottoms. Feel good on your feet and fit well. Are closed at the toe. Do not wear open-toe sandals. Use tools that  help you move around if needed. These include: Canes. Walkers. Scooters. Crutches. Review your medicines with your doctor. Some medicines can make you feel dizzy. This can increase your chance of falling. Ask your doctor what else you can do to help prevent falls. Where to find more information Centers for Disease Control and Prevention, [place]: http://www.wolf.info/ National Institute on Aging: http://kim-miller.com/ Contact a doctor if: You are afraid of falling at home. You feel weak, drowsy, or dizzy at home. You fall at home.  Summary There are many simple things that you can do to make your home safe and to help prevent falls. Ways to make your home safe include removing things that can make you trip and installing grab bars in the bathroom. Ask for help when making these changes in your home. This information is not intended to replace advice given to you by your health care provider. Make sure you discuss any questions you have with your health care provider. Document Revised: 03/06/2020 Document Reviewed: 03/06/2020 Elsevier Patient Education  [place] Maintenance, Female Adopting a healthy lifestyle and getting preventive care are important in promoting health and wellness. Ask your health care provider about: The right schedule for you to have regular tests and exams. Things you can do on your own to prevent diseases and keep yourself healthy. What should I know about diet, weight, and exercise? Eat a healthy diet  Eat a diet that includes plenty of vegetables, fruits, low-fat dairy products, and lean protein. Do not eat a lot of foods that are high in solid fats, added sugars, or sodium. Maintain a healthy weight Body mass index (BMI) is used to identify weight problems. It estimates body fat based on height and weight. Your health care provider can help determine your BMI and help you achieve or maintain a healthy weight. Get regular exercise Get regular exercise. This  is one of the most important things you can do for your health. Most adults should: Exercise for at least 150 minutes each week. The exercise should increase your heart rate and make you sweat (moderate-intensity exercise). Do strengthening exercises at least twice a week. This is in addition to the moderate-intensity exercise. Spend less time sitting. Even light physical activity can be beneficial. Watch cholesterol and blood lipids Have your blood tested for lipids and cholesterol at 50 years of age, then have this test every 5 years. Have your cholesterol levels checked more often if: Your lipid or cholesterol levels are high. You are older than 50 years of age. You are at high risk for heart disease. What should I know about cancer screening? Depending on your health history and family history, you may need to have cancer screening at various ages. This may include screening for: Breast cancer. Cervical cancer. Colorectal cancer. Skin cancer. Lung cancer. What should I know about heart disease, diabetes, and high blood pressure? Blood pressure and heart disease High blood pressure causes heart disease and increases the risk of stroke. This is more likely to develop in people who have high blood pressure readings, are of African descent, or are overweight. Have your blood pressure checked: Every 3-5 years if you are 56-76 years of age. Every year if you are 79 years old or older. Diabetes Have regular diabetes screenings. This checks your fasting blood sugar level. Have the screening done: Once every three years after age 40 if you are at a normal weight and have a low risk for diabetes. More often and at a younger age if you are overweight or have a high risk for diabetes. What should I know about preventing infection? Hepatitis B If you have a higher risk for hepatitis B, you should be screened for this virus. Talk with your health care provider to find out if you are at risk for  hepatitis B infection. Hepatitis C Testing is recommended for: Everyone born from [birth year] through 1965. Anyone with known risk factors for hepatitis C. Sexually transmitted infections (STIs) Get screened for STIs,  including gonorrhea and chlamydia, if: You are sexually active and are younger than 50 years of age. You are older than 50 years of age and your health care provider tells you that you are at risk for this type of infection. Your sexual activity has changed since you were last screened, and you are at increased risk for chlamydia or gonorrhea. Ask your health care provider if you are at risk. Ask your health care provider about whether you are at high risk for HIV. Your health care provider may recommend a prescription medicine to help prevent HIV infection. If you choose to take medicine to prevent HIV, you should first get tested for HIV. You should then be tested every 3 months for as long as you are taking the medicine. Pregnancy If you are about to stop having your period (premenopausal) and you may become pregnant, seek counseling before you get pregnant. Take 400 to 800 micrograms (mcg) of folic acid every day if you become pregnant. Ask for birth control (contraception) if you want to prevent pregnancy. Osteoporosis and menopause Osteoporosis is a disease in which the bones lose minerals and strength with aging. This can result in bone fractures. If you are 22 years old or older, or if you are at risk for osteoporosis and fractures, ask your health care provider if you should: Be screened for bone loss. Take a calcium or vitamin D supplement to lower your risk of fractures. Be given hormone replacement therapy (HRT) to treat symptoms of menopause. Follow these instructions at home: Lifestyle Do not use any products that contain nicotine or tobacco, such as cigarettes, e-cigarettes, and chewing tobacco. If you need help quitting, ask your health care provider. Do not use street  drugs. Do not share needles. Ask your health care provider for help if you need support or information about quitting drugs. Alcohol use Do not drink alcohol if: Your health care provider tells you not to drink. You are pregnant, may be pregnant, or are planning to become pregnant. If you drink alcohol: Limit how much you use to 0-1 drink a day. Limit intake if you are breastfeeding. Be aware of how much alcohol is in your drink. In the [place] one drink equals one 12 oz bottle of beer (355 mL), one 5 oz glass of wine (148 mL), or one 1 oz glass of hard liquor (44 mL). General instructions Schedule regular health, dental, and eye exams. Stay current with your vaccines. Tell your health care provider if: You often feel depressed. You have ever been abused or do not feel safe at home. Summary Adopting a healthy lifestyle and getting preventive care are important in promoting health and wellness. Follow your health care provider's instructions about healthy diet, exercising, and getting tested or screened for diseases. Follow your health care provider's instructions on monitoring your cholesterol and blood pressure. This information is not intended to replace advice given to you by your health care provider. Make sure you discuss any questions you have with your health care provider. Document Revised: 10/11/2020 Document Reviewed: 07/27/2018 Elsevier Patient Education  2022 [place] A mammogram is an X-ray of the breasts. This procedure can screen for and detect any changes that may indicate breast cancer. Mammograms are regularly done beginning at age 17 for women with average risk. A man may have a mammogram if he has a lump or swelling in his breast tissue. A mammogram can also identify other changes and variations in the breast, such as:  Inflammation of the breast tissue (mastitis). An infected area that contains a collection of pus (abscess). A fluid-filled sac  (cyst). Tumors that are not cancerous (benign). Fibrocystic changes. This is when breast tissue becomes denser and can make the tissue feel rope-like or uneven under the skin. Women at higher risk for breast cancer need earlier and more comprehensive screening for abnormal changes. Breast tomosynthesis, or three-dimensional (3D) mammography, and digital breast tomosynthesis are advanced forms of imaging that create 3D pictures of the breasts. Tell a health care provider: About any allergies you have. If you have breast implants. If you have had previous breast disease, biopsy, or surgery. If you have a family history of breast cancer. If you are breastfeeding. Whether you are pregnant or may be pregnant. What are the risks? Generally, this is a safe procedure. However, problems may occur, including: Exposure to radiation. Radiation levels are very low with this test. The need for more tests. The mammogram fails to detect certain cancers or the results are misinterpreted. Difficulty with detecting breast cancer in women with dense breasts. What happens before the procedure? Schedule your test about 1-2 weeks after your menstrual period if you are menstruating. This is usually when your breasts are the least tender. If you have had a mammogram done at a different facility in the past, get the mammogram X-rays or have them sent to your current exam facility. The new and old images will be compared. Wash your breasts and underarms on the day of the test. Do not wear deodorants, perfumes, lotions, or powders anywhere on your body on the day of the test. Remove any jewelry from your neck. Wear clothes that you can change into and out of easily. What happens during the procedure?  You will undress from the waist up and put on a gown that opens in the front. You will stand in front of the X-ray machine. Each breast will be placed between two plastic or glass plates. The plates will compress your  breast for a few seconds. Try to stay as relaxed as possible. This procedure does not cause any harm to your breasts. Any discomfort you feel will be very brief. X-rays will be taken from different angles of each breast. The procedure may vary among health care providers and hospitals. What can I expect after the procedure? The mammogram will be examined by a specialist (radiologist). You may need to repeat certain parts of the test, depending on the quality of the images. This is done if the radiologist needs a better view of the breast tissue. You may resume your normal activities. It is up to you to get the results of your procedure. Ask your health care provider, or the department that is doing the procedure, when your results will be ready. Summary A mammogram is an X-ray of the breasts. This procedure can screen for and detect any changes that may indicate breast cancer. A man may have a mammogram if he has a lump or swelling in his breast tissue. If you have had a mammogram done at a different facility in the past, get the mammogram X-rays or have them sent to your current exam facility in order to compare them. Schedule your test about 1-2 weeks after your menstrual period if you are menstruating. Ask when your test results will be ready. Make sure you get your test results. This information is not intended to replace advice given to you by your health care provider. Make sure you discuss any  questions you have with your health care provider. Document Revised: 06/03/2020 Document Reviewed: 06/03/2020 Elsevier Patient Education  2022 [employer].

## 2021-04-23 NOTE — Progress Notes (Signed)
I connected with  Patricia Davila on 04/23/21 by telephone enabled telemedicine application and verified that I am speaking with the correct person using two identifiers.   I discussed the limitations of evaluation and management by telemedicine. The patient expressed understanding and agreed to proceed.   Subjective:   Patricia Davila is a 50 y.o. female who presents for an Initial Medicare Annual Wellness Visit.  Review of Systems    Defer to PCP.       Objective:    There were no vitals filed for this visit. There is no height or weight on file to calculate BMI.  Advanced Directives 12/27/2020 12/13/2019 07/06/2019 06/07/2019 03/17/2019 03/13/2019 03/25/2017  Does Patient Have a Medical Advance Directive? No No No No No No No  Would patient like information on creating a medical advance directive? No - Patient declined No - Patient declined No - Patient declined No - Patient declined No - Patient declined No - Patient declined Yes (Inpatient - patient requests chaplain consult to create a medical advance directive)    Current Medications (verified) Outpatient Encounter Medications as of 04/23/2021  Medication Sig   amLODipine (NORVASC) 10 MG tablet Take 1 tablet (10 mg total) by mouth daily.   aspirin EC 81 MG tablet Take 81 mg by mouth daily. Swallow whole.   atorvastatin (LIPITOR) 10 MG tablet Take 1 tablet (10 mg total) by mouth daily.   Cholecalciferol (VITAMIN D3) 50 MCG (2000 UT) TABS TAKE 1 TABLET BY MOUTH EVERY DAY   cyclobenzaprine (FLEXERIL) 5 MG tablet TAKE 1 TO 2 TABLETS(5 TO 10 MG) BY MOUTH THREE TIMES DAILY AS NEEDED FOR MUSCLE SPASMS   diclofenac (VOLTAREN) 75 MG EC tablet Take 1 tablet (75 mg total) by mouth 2 (two) times daily. (Patient not taking: No sig reported)   hydrochlorothiazide (HYDRODIURIL) 25 MG tablet TAKE 1 TABLET(25 MG) BY MOUTH DAILY IN THE MORNING   hydrOXYzine (VISTARIL) 25 MG capsule TAKE 1 CAPSULE(25 MG) BY MOUTH THREE TIMES DAILY AS NEEDED (Patient taking  differently: Take 25 mg by mouth every 8 (eight) hours as needed for anxiety or itching.)   losartan (COZAAR) 50 MG tablet Take 1 tablet (50 mg total) by mouth daily.   Multiple Vitamin (MULTIVITAMIN) tablet Take 1 tablet by mouth daily.   naproxen (NAPROSYN) 500 MG tablet Take 500 mg by mouth daily.   omeprazole (PRILOSEC) 40 MG capsule TAKE 1 CAPSULE(40 MG) BY MOUTH DAILY   oxybutynin (DITROPAN-XL) 5 MG 24 hr tablet TAKE 1 TABLET(5 MG) BY MOUTH AT BEDTIME   SUMAtriptan (IMITREX) 25 MG tablet TAKE 1 TABLET BY MOUTH AT ONSET OF MIGRAINE AS NEEDED. MAY REPEAT IN 2 HOURS AS NEEDED   No facility-administered encounter medications on file as of 04/23/2021.    Allergies (verified) Ativan [lorazepam], Anesthesia s-i-40 [propofol], Ditropan [oxybutynin], and Tylenol with codeine #3 [acetaminophen-codeine]   History: Past Medical History:  Diagnosis Date   Anemia    low iron during pregnancy   Anxiety    Arthritis    Asthma    as a child   Bipolar disorder ([place])    Depression    GERD (gastroesophageal reflux disease)    Headache    Hypertension    Left trimalleolar fracture    Pneumonia    as a child   PONV (postoperative nausea and vomiting)    Restless legs    Past Surgical History:  Procedure Laterality Date   BILATERAL ANTERIOR TOTAL HIP ARTHROPLASTY Bilateral 03/25/2017  Procedure: BILATERAL ANTERIOR TOTAL HIP ARTHROPLASTY;  Surgeon: Leandrew Koyanagi, MD;  Location: [facility];  Service: Orthopedics;  Laterality: Bilateral;   DENTAL SURGERY     all teeth extracted   INTRAVASCULAR ULTRASOUND/IVUS N/A 06/09/2019   Procedure: INTRAVASCULAR ULTRASOUND/IVUS;  Surgeon: Elam Dutch, MD;  Location: [facility];  Service: Cardiovascular;  Laterality: N/A;   LAMINECTOMY AND MICRODISCECTOMY LUMBAR SPINE   06/27/2010   ORIF ANKLE FRACTURE Left 03/17/2019   Procedure: OPEN REDUCTION INTERNAL FIXATION (ORIF) LEFT ANKLE FRACTURE;  Surgeon: Leandrew Koyanagi, MD;  Location: [facility];  Service: Orthopedics;  Laterality: Left;   PERIPHERAL VASCULAR INTERVENTION Left 06/09/2019   Procedure: PERIPHERAL VASCULAR INTERVENTION;  Surgeon: Elam Dutch, MD;  Location: [facility];  Service: Cardiovascular;  Laterality: Left;  Common Iliac vein   PERIPHERAL VASCULAR THROMBECTOMY Left 06/09/2019   Procedure: PERIPHERAL VASCULAR THROMBECTOMY;  Surgeon: Elam Dutch, MD;  Location: [facility];  Service: Cardiovascular;  Laterality: Left;   TUBAL LIGATION Bilateral    Family History  Problem Relation Age of Onset   Colon cancer Mother    HIV Mother    Colon cancer Sister    Breast cancer Neg Hx    Social History   Socioeconomic History   Marital status: Married    Spouse name: Not on file   Number of children: Not on file   Years of education: Not on file   Highest education level: Not on file  Occupational History   Not on file  Tobacco Use   Smoking status: Former    Packs/day: 0.25    Types: Cigarettes   Smokeless tobacco: Never  Vaping Use   Vaping Use: Never used  Substance and Sexual Activity   Alcohol use: No   Drug use: No   Sexual activity: Yes    Birth control/protection: Surgical    Comment: BTL  Other Topics Concern   Not on file  Social History Narrative   Not on file   Social Determinants of Health   Financial Resource Strain: Not on file  Food Insecurity: Not on file  Transportation Needs: Not on file  Physical Activity: Not on file  Stress: Not on file  Social Connections: Not on file    Tobacco Counseling Counseling given: Not Answered   Clinical Intake:  Patient denies pain.  She is active in life. Patient cooks meals and prepares dinner for family.  No financial strain.  No fall risks in the home. No trouble with hearing or vision though she just got glasses. 3 weeks ago by [facility].  Patient has seen dentist (3 weeks ago ) for cleaning. Patient will discuss colonoscopy and mammogram  when sees PCP later this month. Declines need for Life Alert. Will go over Advanced Directives at PCP.  Will discuss need for vaccines at PCP.      Diabetic? No    Activities of Daily Living Patient is active in house keeping and planning as well as preparing meals for family. Patient had grandchild at home during phone visit.   Patient Care Team: Kerin Perna, NP as PCP - General (Internal Medicine)  Indicate any recent Medical Services you may have received from other than [facility] providers in the past year (date may be approximate).     Assessment:   This is a routine wellness examination for Patricia Davila.Haynes Hoehn is active in her daily life. She denies falls or fall risks in he  rhome. She has no financial restraints and follows both eye doctor and dentist. She will follow up with PCP later this month to discuss screenings and vaccines needed. Patient denies depression and passed Mini Mental exam.   Hearing/Vision screen Defer to PCP.   Dietary issues and exercise activities discussed:  Goals: Find healthy way to handle stress. Continue to be active in grandchilds life and assist inchores like cleaning and cooking.      Depression Screen PHQ 2/9 Scores 10/23/2020 05/23/2020 11/09/2019 09/19/2019 07/05/2019 03/06/2019 05/19/2018  PHQ - 2 Score 0 2 0 0 0 1 2  PHQ- 9 Score - 5 - - 3 - 10    Fall Risk Fall Risk  10/23/2020 05/23/2020 11/09/2019 09/19/2019 03/06/2019  Falls in the past year? 0 0 0 0 1  Number falls in past yr: - - - - 0  Injury with Fall? - - - - 1    FALL RISK PREVENTION PERTAINING TO THE HOME:  Any stairs in or around the home? No  If so, are there any without handrails? No  Home free of loose throw rugs in walkways, pet beds, electrical cords, etc? Yes  Adequate lighting in your home to reduce risk of falls? Yes   ASSISTIVE DEVICES UTILIZED TO PREVENT FALLS:  Life alert? No  Use of a cane, walker or w/c? No  Grab bars in the bathroom? No  Shower chair or bench in  shower? No  Elevated toilet seat or a handicapped toilet? No   TIMED UP AND GO:  Was the test performed? No .    Gait steady and fast without use of assistive device  Cognitive Function:  Passed Mini Mental     Immunizations Immunization History  Administered Date(s) Administered   Influenza,inj,Quad PF,6+ Mos 05/19/2018, 06/10/2019   Td 08/17/2005   Tdap 07/10/2012   Vaccines to date? TDap (Tetnus) Vaccine Status: Completed 2013. Flu Vaccine status: Due, Education has been provided regarding the importance of this vaccine. Advised may receive this vaccine at local pharmacy or Health Dept. Aware to provide a copy of the vaccination record if obtained from local pharmacy or Health Dept. Verbalized acceptance and understanding.  Pneumococcal vaccine status: Due, Education has been provided regarding the importance of this vaccine. Advised may receive this vaccine at local pharmacy or Health Dept. Aware to provide a copy of the vaccination record if obtained from local pharmacy or Health Dept. Verbalized acceptance and understanding.  Covid-19 vaccine status: Completed vaccines  Qualifies for Shingles Vaccine? Yes   Zostavax completed No   Shingrix Completed?: No.    Education has been provided regarding the importance of this vaccine. Patient has been advised to call insurance company to determine out of pocket expense if they have not yet received this vaccine. Advised may also receive vaccine at local pharmacy or Health Dept. Verbalized acceptance and understanding.  Screening Tests Health Maintenance  Topic Date Due   COVID-19 Vaccine (1) Never done   COLONOSCOPY (Pts 45-18yr Insurance coverage will need to be confirmed)  Never done   Zoster Vaccines- Shingrix (1 of 2) Never done   INFLUENZA VACCINE  03/17/2021   MAMMOGRAM  11/08/2021   TETANUS/TDAP  07/10/2022   PAP SMEAR-Modifier  11/09/2022   Hepatitis C Screening  Completed   HIV Screening  Completed   Pneumococcal  Vaccine [id number] old  Aged Out   HPV VACCINES  Aged Out    Health Maintenance  Health Maintenance Due  Topic Date Due  COVID-19 Vaccine (1) Never done   COLONOSCOPY (Pts 45-63yr Insurance coverage will need to be confirmed)  Never done   Zoster Vaccines- Shingrix (1 of 2) Never done   INFLUENZA VACCINE  03/17/2021    Colorectal Screening: Defer to discuss with PCP.  Discuss with PCP at next visit later in month.   Mammogram Screening: Defer to PCP as needed and will be ordered at next visit.  Bone Density Screening:Discuss at next PCP visit.  Lung Cancer Screening: (Low Dose CT Chest recommended if Age 50-80years, 30 pack-year currently smoking OR have quit w/in 15years.) does qualify.   Lung Cancer Screening Referral: Former smoker will discuss this with PCP.  Additional Screening:  Hepatitis C Screening: does not qualify; Completed 05/23/2020  Vision Screening: Recommended annual ophthalmology exams for early detection of glaucoma and other disorders of the eye. Is the patient up to date with their annual eye exam?  Yes  Who is the provider or what is the name of the office in which the patient attends annual eye exams? [facility] If pt is not established with a provider, would they like to be referred to a provider to establish care? No .   Dental Screening: Recommended annual dental exams for proper oral hygiene  Community Resource Referral / Chronic Care Management: CRR required this visit?  No   CCM required this visit?  No      Plan:    Discuss screenings and vaccines at upcoming appointment with PCP.  Keep appointments with Eye doctor and Dentist.  I have personally reviewed and noted the following in the patient's chart:   Medical and social history Use of alcohol, tobacco or illicit drugs  Current medications and supplements including opioid prescriptions. Patient is not currently taking opioid prescriptions. Functional ability and  status Nutritional status Physical activity Advanced directives List of other physicians Hospitalizations, surgeries, and ER visits in previous 12 months Vitals Screenings to include cognitive, depression, and falls Referrals and appointments  In addition, I have reviewed and discussed with patient certain preventive protocols, quality metrics, and best practice recommendations. A written personalized care plan for preventive services as well as general preventive health recommendations were provided to patient.     JStephan Minister [place]  04/23/2021

## 2021-05-07 ENCOUNTER — Other Ambulatory Visit: Payer: Self-pay

## 2021-05-07 ENCOUNTER — Encounter (INDEPENDENT_AMBULATORY_CARE_PROVIDER_SITE_OTHER): Payer: Self-pay | Admitting: Primary Care

## 2021-05-07 ENCOUNTER — Ambulatory Visit (INDEPENDENT_AMBULATORY_CARE_PROVIDER_SITE_OTHER): Payer: Medicare Other | Admitting: Primary Care

## 2021-05-07 VITALS — BP 111/78 | HR 82 | Temp 97.3°F | Ht 61.0 in | Wt 215.8 lb

## 2021-05-07 DIAGNOSIS — E559 Vitamin D deficiency, unspecified: Secondary | ICD-10-CM | POA: Diagnosis not present

## 2021-05-07 DIAGNOSIS — G43101 Migraine with aura, not intractable, with status migrainosus: Secondary | ICD-10-CM

## 2021-05-07 DIAGNOSIS — Z760 Encounter for issue of repeat prescription: Secondary | ICD-10-CM | POA: Diagnosis not present

## 2021-05-07 DIAGNOSIS — N3946 Mixed incontinence: Secondary | ICD-10-CM | POA: Diagnosis not present

## 2021-05-07 DIAGNOSIS — I10 Essential (primary) hypertension: Secondary | ICD-10-CM | POA: Diagnosis not present

## 2021-05-07 DIAGNOSIS — Z23 Encounter for immunization: Secondary | ICD-10-CM

## 2021-05-07 DIAGNOSIS — Z1211 Encounter for screening for malignant neoplasm of colon: Secondary | ICD-10-CM

## 2021-05-07 DIAGNOSIS — E7800 Pure hypercholesterolemia, unspecified: Secondary | ICD-10-CM

## 2021-05-07 DIAGNOSIS — Z6841 Body Mass Index (BMI) 40.0 and over, adult: Secondary | ICD-10-CM

## 2021-05-07 MED ORDER — SUMATRIPTAN SUCCINATE 50 MG PO TABS: ORAL_TABLET | ORAL | 0 refills | Status: DC

## 2021-05-07 MED ORDER — LOSARTAN POTASSIUM 50 MG PO TABS: 50.0000 mg | ORAL_TABLET | Freq: Every day | ORAL | 1 refills | Status: DC

## 2021-05-07 MED ORDER — HYDROCHLOROTHIAZIDE 25 MG PO TABS: ORAL_TABLET | ORAL | 1 refills | Status: DC

## 2021-05-07 MED ORDER — AMLODIPINE BESYLATE 10 MG PO TABS: 10.0000 mg | ORAL_TABLET | Freq: Every day | ORAL | 1 refills | Status: DC

## 2021-05-07 NOTE — Progress Notes (Signed)
[facility]   Subjective:    Patricia Davila is a 50 y.o. female who presents for follow-up of migraine headaches. Home treatment has included BC powder stop because legs started cramping  with no improvement. Headaches are occurring continuously and every day. Generally, the headaches last about  24 hours for the last 6 days . Previously prescribed   sumatriptan but no longer working. Work attendance or other daily activities are not affected by the headaches. The patient denies dizziness, loss of balance, vision problems, and nauseated  . She also, had to stop oxybutynin because it made her have depression , anger and irritability . Kegel exercises not helping either.  The following portions of the patient's history were reviewed and updated as appropriate: allergies, current medications, past family history, past medical history, past social history, and past surgical history.  Review of Systems Pertinent items noted in HPI and remainder of comprehensive ROS otherwise negative.    Objective:    BP 111/78 (BP Location: Right Arm, Patient Position: Sitting, Cuff Size: Large)   Pulse 82   Temp (!) 97.3 F (36.3 C) (Temporal)   Ht '5\' 1"'  (1.549 m)   Wt 215 lb 12.8 oz (97.9 kg)   BMI 40.78 kg/m   General Appearance:    Morbid obese female, Alert, cooperative, no distress, appears stated age  Head:    Normocephalic, without obvious abnormality, atraumatic  Eyes:    PERRL, conjunctiva/corneas clear, EOM's intact, fundi    benign, both eyes  Ears:    Normal TM's and external ear canals, both ears  Nose:   Nares normal, septum midline, mucosa normal, no drainage    or sinus tenderness  Throat:   Lips, mucosa, and tongue normal; teeth and gums normal  Neck:   Supple, symmetrical, trachea midline, no adenopathy;    thyroid:  no enlargement/tenderness/nodules; no carotid   bruit or JVD  Back:     Symmetric, no curvature, ROM normal, no CVA tenderness  Lungs:     Clear to  auscultation bilaterally, respirations unlabored  Chest Wall:    No tenderness or deformity   Heart:    Regular rate and rhythm, S1 and S2 normal, no murmur, rub   or gallop     Abdomen:     Soft, non-tender, bowel sounds active all four quadrants,    no masses, no organomegaly        Extremities:   Extremities normal, atraumatic, no cyanosis or edema  Pulses:   2+ and symmetric all extremities  Skin:   Skin color, texture, turgor normal, no rashes or lesions  Lymph nodes:   Cervical, supraclavicular, and axillary nodes normal  Neurologic:    normal strength, sensation and reflexes    throughout      Assessment:  Patricia Davila was seen today for migraine and medication refill.  Diagnoses and all orders for this visit:  Migraine with aura and with status migrainosus, not intractable Continue present treatment and plan. Increase SUMAtriptan (IMITREX) from 43m to 50 MG tablet; May repeat in 2 hours if headache persists or recurs.  Mixed stress and urge urinary i Lie in darkened room and apply cold packs as needed for pain. Avoid alcohol and caffeine. Drinks 6 packs coke/pepsi  a day  Referral to Neurology. -     Ambulatory referral to Neurology  Mixed stress and urge urinary incontinence -     Ambulatory referral to Urology   Colon cancer screening -  Ambulatory referral to Gastroenterology  Morbidly obese [facility]) Morbid Obesity is > 40 BMI indicating an excess in caloric intake or underlining conditions. This may lead to other co-morbidities. Lifestyle modifications of diet and exercise may reduce obesity.   Discussed diet changes, decreasing 6 soda daily , carbs  Make sure you are drinking at least 48 oz of water per day. Work on eating a low fat, heart healthy diet and participate in regular aerobic exercise program to control as well. Exercise at least  30 minutes per day-5 days per week. Avoid red meat. No fried foods. No junk foods, sodas, sugary foods or drinks, unhealthy  snacking, alcohol or smoking.      Elevated LDL cholesterol level -     Lipid panel; Future  Medication refill hydrochlorothiazide (HYDRODIURIL) 25 MG tablet; Take 1 tablet daily -     losartan (COZAAR) 50 MG tablet; Take 1 tablet (50 mg total) by mouth daily.  Vitamin D deficiency -     Vitamin D, 25-hydroxy; Future  Hypertension, unspecified type Blood pressure well controlled and at goal of less than 130/80, low-sodium, DASH diet, medication compliance, 150 minutes of moderate intensity exercise per week. Discussed medication compliance, adverse effects.  -     amLODipine (NORVASC) 10 MG tablet; Take 1 tablet (10 mg total) by mouth daily. -     hydrochlorothiazide (HYDRODIURIL) 25 MG tablet; Take 1 tablet daily -     losartan (COZAAR) 50 MG tablet; Take 1 tablet (50 mg total) by mouth daily. -     CMP14+EGFR; Future  Need for immunization against influenza -     Flu Vaccine QUAD 76moIM (Fluarix, Fluzone & Alfiuria Quad PF)  MKerin PernaNP

## 2021-05-07 NOTE — Patient Instructions (Signed)
Recombinant Zoster (Shingles) Vaccine: What You Need to Know 1. Why get vaccinated? Recombinant zoster (shingles) vaccine can prevent shingles. Shingles (also called herpes zoster, or just zoster) is a painful skin rash, usually with blisters. In addition to the rash, shingles can cause fever, headache, chills, or upset stomach. Rarely, shingles can lead to complications such as pneumonia, hearing problems, blindness, brain inflammation (encephalitis), or death. The risk of shingles increases with age. The most common complication of shingles is long-term nerve pain called postherpetic neuralgia (PHN). PHN occurs in the areas where the shingles rash was and can last for months or years after the rash goes away. The pain from PHN can be severe and debilitating. The risk of PHN increases with age. An older adult with shingles is more likely to develop PHN and have longer lasting and more severe pain than a younger person. People with weakened immune systems also have a higher risk of getting shingles and complications from the disease. Shingles is caused by varicella-zoster virus, the same virus that causes chickenpox. After you have chickenpox, the virus stays in your body and can cause shingles later in life. Shingles cannot be passed from one person to another, but the virus that causes shingles can spread and cause chickenpox in someone who has never had chickenpox or has never received chickenpox vaccine. 2. Recombinant shingles vaccine Recombinant shingles vaccine provides strong protection against shingles. By preventing shingles, recombinant shingles vaccine also protects against PHN and other complications. Recombinant shingles vaccine is recommended for: Adults 68 years and older Adults 19 years and older who have a weakened immune system because of disease or treatments Shingles vaccine is given as a two-dose series. For most people, the second dose should be given 2 to 6 months after the first  dose. Some people who have or will have a weakened immune system can get the second dose 1 to 2 months after the first dose. Ask your health care provider for guidance. People who have had shingles in the past and people who have received varicella (chickenpox) vaccine are recommended to get recombinant shingles vaccine. The vaccine is also recommended for people who have already gotten another type of shingles vaccine, the live shingles vaccine. There is no live virus in recombinant shingles vaccine. Shingles vaccine may be given at the same time as other vaccines. 3. Talk with your health care provider Tell your vaccination provider if the person getting the vaccine: Has had an allergic reaction after a previous dose of recombinant shingles vaccine, or has any severe, life-threatening allergies Is currently experiencing an episode of shingles Is pregnant In some cases, your health care provider may decide to postpone shingles vaccination until a future visit. People with minor illnesses, such as a cold, may be vaccinated. People who are moderately or severely ill should usually wait until they recover before getting recombinant shingles vaccine. Your health care provider can give you more information. 4. Risks of a vaccine reaction A sore arm with mild or moderate pain is very common after recombinant shingles vaccine. Redness and swelling can also happen at the site of the injection. Tiredness, muscle pain, headache, shivering, fever, stomach pain, and nausea are common after recombinant shingles vaccine. These side effects may temporarily prevent a vaccinated person from doing regular activities. Symptoms usually go away on their own in 2 to 3 days. You should still get the second dose of recombinant shingles vaccine even if you had one of these reactions after the first dose. Guillain-Barr  syndrome (GBS), a serious nervous system disorder, has been reported very rarely after recombinant zoster  vaccine. People sometimes faint after medical procedures, including vaccination. Tell your provider if you feel dizzy or have vision changes or ringing in the ears. As with any medicine, there is a very remote chance of a vaccine causing a severe allergic reaction, other serious injury, or death. 5. What if there is a serious problem? An allergic reaction could occur after the vaccinated person leaves the clinic. If you see signs of a severe allergic reaction (hives, swelling of the face and throat, difficulty breathing, a fast heartbeat, dizziness, or weakness), call 9-1-1 and get the person to the nearest hospital. For other signs that concern you, call your health care provider. Adverse reactions should be reported to the Vaccine Adverse Event Reporting System (VAERS). Your health care provider will usually file this report, or you can do it yourself. Visit the VAERS website at www.vaers.SamedayNews.es or call 984-773-4812. VAERS is only for reporting reactions, and VAERS staff members do not give medical advice. 6. How can I learn more? Ask your health care provider. Call your local or state health department. Visit the website of the Food and Drug Administration (FDA) for vaccine package inserts and additional information at http://lopez-wang.org/. Contact the Centers for Disease Control and Prevention (CDC): Call 727-164-3356 (1-800-CDC-INFO) or Visit CDC's website at http://hunter.com/. Vaccine Information Statement Recombinant Zoster Vaccine (09/20/2020) This information is not intended to replace advice given to you by your health care provider. Make sure you discuss any questions you have with your health care provider. Document Revised: 10/04/2020 Document Reviewed: 10/04/2020 Elsevier Patient Education  2022 [employer].

## 2021-05-10 ENCOUNTER — Encounter (INDEPENDENT_AMBULATORY_CARE_PROVIDER_SITE_OTHER): Payer: Self-pay | Admitting: Primary Care

## 2021-05-12 ENCOUNTER — Other Ambulatory Visit (INDEPENDENT_AMBULATORY_CARE_PROVIDER_SITE_OTHER): Payer: Medicare Other

## 2021-05-12 DIAGNOSIS — Z03818 Encounter for observation for suspected exposure to other biological agents ruled out: Secondary | ICD-10-CM | POA: Diagnosis not present

## 2021-05-12 DIAGNOSIS — Z20822 Contact with and (suspected) exposure to covid-19: Secondary | ICD-10-CM | POA: Diagnosis not present

## 2021-05-21 ENCOUNTER — Other Ambulatory Visit: Payer: Self-pay

## 2021-05-21 ENCOUNTER — Other Ambulatory Visit (INDEPENDENT_AMBULATORY_CARE_PROVIDER_SITE_OTHER): Payer: Medicare Other

## 2021-05-21 DIAGNOSIS — E559 Vitamin D deficiency, unspecified: Secondary | ICD-10-CM | POA: Diagnosis not present

## 2021-05-21 DIAGNOSIS — I10 Essential (primary) hypertension: Secondary | ICD-10-CM | POA: Diagnosis not present

## 2021-05-21 DIAGNOSIS — E7800 Pure hypercholesterolemia, unspecified: Secondary | ICD-10-CM

## 2021-05-22 ENCOUNTER — Encounter (INDEPENDENT_AMBULATORY_CARE_PROVIDER_SITE_OTHER): Payer: Self-pay | Admitting: Primary Care

## 2021-05-22 ENCOUNTER — Other Ambulatory Visit: Payer: Self-pay | Admitting: Nurse Practitioner

## 2021-05-22 LAB — CMP14+EGFR
ALT: 14 IU/L (ref 0–32)
AST: 14 IU/L (ref 0–40)
Albumin/Globulin Ratio: 1.6 (ref 1.2–2.2)
Albumin: 4.2 g/dL (ref 3.8–4.8)
Alkaline Phosphatase: 111 IU/L (ref 44–121)
BUN/Creatinine Ratio: 15 (ref 9–23)
BUN: 12 mg/dL (ref 6–24)
Bilirubin Total: 0.2 mg/dL (ref 0.0–1.2)
CO2: 22 mmol/L (ref 20–29)
Calcium: 9.3 mg/dL (ref 8.7–10.2)
Chloride: 105 mmol/L (ref 96–106)
Creatinine, Ser: 0.82 mg/dL (ref 0.57–1.00)
Globulin, Total: 2.7 g/dL (ref 1.5–4.5)
Glucose: 84 mg/dL (ref 70–99)
Potassium: 4.1 mmol/L (ref 3.5–5.2)
Sodium: 140 mmol/L (ref 134–144)
Total Protein: 6.9 g/dL (ref 6.0–8.5)
eGFR: 87 mL/min/{1.73_m2} (ref >59)

## 2021-05-22 LAB — LIPID PANEL
Chol/HDL Ratio: 4 ratio (ref 0.0–4.4)
Cholesterol, Total: 140 mg/dL (ref 100–199)
HDL: 35 mg/dL — ABNORMAL LOW (ref >39)
LDL Chol Calc (NIH): 90 mg/dL (ref 0–99)
Triglycerides: 77 mg/dL (ref 0–149)
VLDL Cholesterol Cal: 15 mg/dL (ref 5–40)

## 2021-05-22 LAB — VITAMIN D 25 HYDROXY (VIT D DEFICIENCY, FRACTURES): Vit D, 25-Hydroxy: 18.3 ng/mL — ABNORMAL LOW (ref 30.0–100.0)

## 2021-05-22 MED ORDER — VITAMIN D (ERGOCALCIFEROL) 1.25 MG (50000 UNIT) PO CAPS: 50000.0000 [IU] | ORAL_CAPSULE | ORAL | 1 refills | Status: DC

## 2021-05-28 DIAGNOSIS — H11133 Conjunctival pigmentations, bilateral: Secondary | ICD-10-CM | POA: Diagnosis not present

## 2021-05-28 DIAGNOSIS — H04123 Dry eye syndrome of bilateral lacrimal glands: Secondary | ICD-10-CM | POA: Diagnosis not present

## 2021-05-28 DIAGNOSIS — H40013 Open angle with borderline findings, low risk, bilateral: Secondary | ICD-10-CM | POA: Diagnosis not present

## 2021-05-29 ENCOUNTER — Encounter: Payer: Self-pay | Admitting: Psychiatry

## 2021-05-29 ENCOUNTER — Ambulatory Visit (INDEPENDENT_AMBULATORY_CARE_PROVIDER_SITE_OTHER): Payer: Medicare Other | Admitting: Psychiatry

## 2021-05-29 VITALS — BP 125/88 | HR 72 | Ht 61.0 in | Wt 213.0 lb

## 2021-05-29 DIAGNOSIS — G43109 Migraine with aura, not intractable, without status migrainosus: Secondary | ICD-10-CM

## 2021-05-29 MED ORDER — SUMATRIPTAN SUCCINATE 100 MG PO TABS: 100.0000 mg | ORAL_TABLET | ORAL | 2 refills | Status: DC | PRN

## 2021-05-29 MED ORDER — AJOVY 225 MG/1.5ML ~~LOC~~ SOAJ: 225.0000 mg | SUBCUTANEOUS | 3 refills | Status: DC
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
## 2021-05-29 NOTE — Patient Instructions (Signed)
Start Ajovy for migraine prevention. Inject once per month. Increase sumatriptan to 100 mg. Take at the onset of migraine. If headache recurs or does not fully resolve, you may take a second dose after 2 hours. Please avoid taking more than 2 days per week or 10 days per month.

## 2021-06-02 ENCOUNTER — Telehealth: Payer: Self-pay

## 2021-06-02 NOTE — Telephone Encounter (Addendum)
PA for Ajovy 225MG /1.5ML Aut Inj 1.5ML submitted via CMM. Your information has been sent to [facility] Typically an electronic response will be received within 24-72 hours

## 2021-06-04 ENCOUNTER — Other Ambulatory Visit: Payer: Self-pay | Admitting: Psychiatry

## 2021-06-04 ENCOUNTER — Encounter (INDEPENDENT_AMBULATORY_CARE_PROVIDER_SITE_OTHER): Payer: Self-pay | Admitting: Primary Care

## 2021-06-04 MED ORDER — AIMOVIG 70 MG/ML ~~LOC~~ SOAJ: 70.0000 mg | SUBCUTANEOUS | 2 refills | Status: DC

## 2021-06-04 NOTE — Telephone Encounter (Signed)
Ajovy autoinjector is denied because it is not on your plan's Drug List (formulary). Medication authorization requires the following: (1) You need to try two (2) of these covered drugs: (a) Aimovig*. (b) Emgality auto-injector solution*.

## 2021-06-10 ENCOUNTER — Telehealth: Payer: Self-pay | Admitting: Psychiatry

## 2021-06-10 ENCOUNTER — Telehealth: Payer: Self-pay

## 2021-06-10 NOTE — Telephone Encounter (Signed)
Contacted pt, informed her I submitted PA to her insurance. Waiting for determination

## 2021-06-10 NOTE — Telephone Encounter (Signed)
Pt states that both shots that Dr Billey Gosling has tried having called in for her, neither are covered thru her insurance.  Pt is asking for a call as to what can be done.

## 2021-06-10 NOTE — Telephone Encounter (Signed)
PA for (AIMOVIG) 70 MG/ML SOAJ Submitted via CMM, (Key: [id number]) Awaiting Determination.

## 2021-06-11 NOTE — Telephone Encounter (Signed)
Aimovig is denied for not meeting the prior authorization requirement(s). Medication authorization requires the following: (1) You have a trial and failure (after a trial of at least two months), contraindication, or intolerance to two of the following preventative therapies from the list below: (A) Amitriptyline (Elavil). (B) One of the following beta-blockers: Atenolol, metoprolol, nadolol, propranolol, or timolol. (C) Divalproex sodium (Depakote/Depakote ER). (D) OnabotulinumtoxinA (Botox) (for chronic migraine only). (E) Topiramate (Topamax). (F) Venlafaxine (Effexor). (G) Candesartan (Atacand)

## 2021-06-17 ENCOUNTER — Other Ambulatory Visit: Payer: Self-pay | Admitting: Psychiatry

## 2021-06-17 MED ORDER — DULOXETINE HCL 30 MG PO CPEP: 30.0000 mg | ORAL_CAPSULE | Freq: Every day | ORAL | 3 refills | Status: DC

## 2021-06-17 MED ORDER — VENLAFAXINE HCL ER 37.5 MG PO CP24: 37.5000 mg | ORAL_CAPSULE | Freq: Every day | ORAL | 2 refills | Status: DC

## 2021-06-17 NOTE — Telephone Encounter (Signed)
Did they give a reason for the Aimovig denial? Typically medicare will cover it when a patient has failed 2 other preventives.  I'll send in a prescription for Cymbalta to her pharmacy as an alternative. She can take this daily for headache prevention

## 2021-06-17 NOTE — Telephone Encounter (Signed)
Contacted pt, informed her that [facility] recommends now that she tried a medication called Effexor for prevention. It's the same type of medication as Cymbalta. If she doesn't do well on Effexor she should qualify for the injectables as a next step. Pt understood and was appreciative for the call.

## 2021-06-17 NOTE — Telephone Encounter (Signed)
Pt contacted [place] asked what does she need to do now ?  Any other options that her insurance will cover? Already denied ajovy as well, please advise

## 2021-07-02 ENCOUNTER — Other Ambulatory Visit: Payer: Self-pay

## 2021-07-02 ENCOUNTER — Ambulatory Visit (INDEPENDENT_AMBULATORY_CARE_PROVIDER_SITE_OTHER): Payer: Medicare Other | Admitting: Emergency Medicine

## 2021-07-02 ENCOUNTER — Encounter: Payer: Self-pay | Admitting: Emergency Medicine

## 2021-07-02 VITALS — BP 112/62 | HR 82 | Ht 61.0 in | Wt 215.0 lb

## 2021-07-02 DIAGNOSIS — G894 Chronic pain syndrome: Secondary | ICD-10-CM | POA: Diagnosis not present

## 2021-07-02 DIAGNOSIS — Z1211 Encounter for screening for malignant neoplasm of colon: Secondary | ICD-10-CM | POA: Diagnosis not present

## 2021-07-02 DIAGNOSIS — M154 Erosive (osteo)arthritis: Secondary | ICD-10-CM | POA: Diagnosis not present

## 2021-07-02 DIAGNOSIS — Z86718 Personal history of other venous thrombosis and embolism: Secondary | ICD-10-CM

## 2021-07-02 DIAGNOSIS — Z760 Encounter for issue of repeat prescription: Secondary | ICD-10-CM | POA: Diagnosis not present

## 2021-07-02 DIAGNOSIS — E785 Hyperlipidemia, unspecified: Secondary | ICD-10-CM | POA: Diagnosis not present

## 2021-07-02 DIAGNOSIS — I10 Essential (primary) hypertension: Secondary | ICD-10-CM

## 2021-07-02 DIAGNOSIS — Z1231 Encounter for screening mammogram for malignant neoplasm of breast: Secondary | ICD-10-CM | POA: Diagnosis not present

## 2021-07-02 DIAGNOSIS — E7800 Pure hypercholesterolemia, unspecified: Secondary | ICD-10-CM

## 2021-07-02 DIAGNOSIS — Z7689 Persons encountering health services in other specified circumstances: Secondary | ICD-10-CM

## 2021-07-02 MED ORDER — AMLODIPINE BESYLATE 10 MG PO TABS: 10.0000 mg | ORAL_TABLET | Freq: Every day | ORAL | 1 refills | Status: DC

## 2021-07-02 MED ORDER — OMEPRAZOLE 40 MG PO CPDR: 40.0000 mg | DELAYED_RELEASE_CAPSULE | Freq: Every day | ORAL | 3 refills | Status: DC

## 2021-07-02 MED ORDER — HYDROCHLOROTHIAZIDE 25 MG PO TABS: ORAL_TABLET | ORAL | 1 refills | Status: DC

## 2021-07-02 MED ORDER — ATORVASTATIN CALCIUM 10 MG PO TABS: 10.0000 mg | ORAL_TABLET | Freq: Every day | ORAL | 3 refills | Status: DC

## 2021-07-02 NOTE — Assessment & Plan Note (Signed)
Well-controlled hypertension. BP Readings from Last 3 Encounters:  07/02/21 112/62  05/29/21 125/88  05/07/21 111/78  Continue amlodipine 10 mg, losartan 50 mg, and hydrochlorothiazide 25 mg daily. Dietary approaches to control hypertension discussed.

## 2021-07-02 NOTE — Patient Instructions (Signed)

## 2021-07-02 NOTE — Assessment & Plan Note (Signed)
Needs pain management referral.

## 2021-07-02 NOTE — Progress Notes (Signed)
Patricia Davila 50 y.o.   Chief Complaint  Patient presents with   New Patient (Initial Visit)    Manage chronic conditions, BP,migraines, hip/back/ankle pain.   Medication Refill    Amlodipine, atorvastatin, hydrochlorothiazide,prilosec    HISTORY OF PRESENT ILLNESS: This is a 50 y.o. female first visit to this office here to establish care. Has the following chronic medical problems and conditions: 1.  Hypertension: On amlodipine 10 mg, losartan 50 mg and hydrochlorothiazide 25 mg daily 2.  Dyslipidemia: On atorvastatin 10 mg daily 3.  Chronic pain: Needs referral for pain management Status post back surgery 06/27/2010 4.  Diffuse osteoarthritis with chronic pain Needs mammogram, colonoscopy and GYN referral. 5.  History of blood clots.  Takes baby aspirin daily.  Medication Refill Pertinent negatives include no abdominal pain, chest pain, chills, congestion, coughing, fever, headaches, nausea, rash, sore throat or vomiting.    Prior to Admission medications   Medication Sig Start Date End Date Taking? Authorizing Provider  amLODipine (NORVASC) 10 MG tablet Take 1 tablet (10 mg total) by mouth daily. 05/07/21  Yes Kerin Perna, NP  aspirin EC 81 MG tablet Take 81 mg by mouth daily. Swallow whole.   Yes [provider]  atorvastatin (LIPITOR) 10 MG tablet Take 1 tablet (10 mg total) by mouth daily. 10/23/20  Yes Kerin Perna, NP  Cholecalciferol (VITAMIN D3) 50 MCG (2000 UT) TABS TAKE 1 TABLET BY MOUTH EVERY DAY 08/14/19  Yes Kerin Perna, NP  cyclobenzaprine (FLEXERIL) 5 MG tablet TAKE 1 TO 2 TABLETS(5 TO 10 MG) BY MOUTH THREE TIMES DAILY AS NEEDED FOR MUSCLE SPASMS 12/09/20  Yes Aundra Dubin, PA-C  hydrochlorothiazide (HYDRODIURIL) 25 MG tablet Take 1 tablet daily 05/07/21  Yes Kerin Perna, NP  losartan (COZAAR) 50 MG tablet Take 1 tablet (50 mg total) by mouth daily. 05/07/21  Yes Kerin Perna, NP  Multiple Vitamin (MULTIVITAMIN) tablet  Take 1 tablet by mouth daily.   Yes [provider]  omeprazole (PRILOSEC) 40 MG capsule TAKE 1 CAPSULE(40 MG) BY MOUTH DAILY 07/23/20  Yes Kerin Perna, NP  SUMAtriptan (IMITREX) 100 MG tablet Take 1 tablet (100 mg total) by mouth every 2 (two) hours as needed for migraine. May repeat in 2 hours if headache persists or recurs. 05/29/21  Yes Genia Harold, MD  Vitamin D, Ergocalciferol, (DRISDOL) 1.25 MG (50000 UNIT) CAPS capsule Take 1 capsule (50,000 Units total) by mouth every 7 (seven) days. 05/22/21  Yes Gildardo Pounds, NP    Allergies  Allergen Reactions   Ativan [Lorazepam] Anxiety    HALLUCINATIONS   Anesthesia S-I-40 [Propofol]     hallucinations   Ditropan [Oxybutynin]     Anxiety   Tylenol With Codeine #3 [Acetaminophen-Codeine] Hives and Itching    Patient Active Problem List   Diagnosis Date Noted   Pulmonary embolism ([place]) 06/07/2019   Left leg DVT ([place]) 06/07/2019   Displaced trimalleolar fracture of left lower leg, subsequent encounter for closed fracture with delayed healing 03/17/2019   Primary osteoarthritis of right hip 04/08/2017   History of hip replacement 03/25/2017   Primary osteoarthritis of left hip 12/22/2016   Major depressive disorder 10/24/2012   Smokes tobacco daily 05/13/2010   Headache(784.0) 05/13/2010   Microalbuminuria 04/16/2009   GERD 11/22/2008   Left hip pain 11/22/2008   Bipolar 1 disorder, depressed ([place]) 05/17/2007   Lumbosacral degenerative disk disease 03/23/2005   Essential hypertension, benign 08/18/2003    Past Medical History:  Diagnosis Date   Anemia    low iron during pregnancy   Anxiety    Arthritis    Asthma    as a child   Bipolar disorder ([place])    Depression    GERD (gastroesophageal reflux disease)    Headache    Hypertension    Left trimalleolar fracture    Pneumonia    as a child   PONV (postoperative nausea and vomiting)    Restless legs     Past Surgical History:  Procedure  Laterality Date   BILATERAL ANTERIOR TOTAL HIP ARTHROPLASTY Bilateral 03/25/2017   Procedure: BILATERAL ANTERIOR TOTAL HIP ARTHROPLASTY;  Surgeon: Leandrew Koyanagi, MD;  Location: [place] [facility];  Service: Orthopedics;  Laterality: Bilateral;   DENTAL SURGERY     all teeth extracted   INTRAVASCULAR ULTRASOUND/IVUS N/A 06/09/2019   Procedure: INTRAVASCULAR ULTRASOUND/IVUS;  Surgeon: Elam Dutch, MD;  Location: [facility];  Service: Cardiovascular;  Laterality: N/A;   LAMINECTOMY AND MICRODISCECTOMY LUMBAR SPINE   06/27/2010   ORIF ANKLE FRACTURE Left 03/17/2019   Procedure: OPEN REDUCTION INTERNAL FIXATION (ORIF) LEFT ANKLE FRACTURE;  Surgeon: Leandrew Koyanagi, MD;  Location: [place];  Service: Orthopedics;  Laterality: Left;   PERIPHERAL VASCULAR INTERVENTION Left 06/09/2019   Procedure: PERIPHERAL VASCULAR INTERVENTION;  Surgeon: Elam Dutch, MD;  Location: [facility];  Service: Cardiovascular;  Laterality: Left;  Common Iliac vein   PERIPHERAL VASCULAR THROMBECTOMY Left 06/09/2019   Procedure: PERIPHERAL VASCULAR THROMBECTOMY;  Surgeon: Elam Dutch, MD;  Location: [facility];  Service: Cardiovascular;  Laterality: Left;   TUBAL LIGATION Bilateral     Social History   Socioeconomic History   Marital status: Married    Spouse name: Not on file   Number of children: Not on file   Years of education: Not on file   Highest education level: Not on file  Occupational History   Not on file  Tobacco Use   Smoking status: Former    Packs/day: 0.25    Types: Cigarettes   Smokeless tobacco: Never  Vaping Use   Vaping Use: Never used  Substance and Sexual Activity   Alcohol use: No   Drug use: No   Sexual activity: Yes    Birth control/protection: Surgical    Comment: BTL  Other Topics Concern   Not on file  Social History Narrative   Not on file   Social Determinants of Health   Financial Resource Strain: Not on file  Food Insecurity:  Not on file  Transportation Needs: Not on file  Physical Activity: Not on file  Stress: Not on file  Social Connections: Not on file  Intimate Partner Violence: Not on file    Family History  Problem Relation Age of Onset   Colon cancer Mother    HIV Mother    Colon cancer Sister    Breast cancer Neg Hx      Review of Systems  Constitutional: Negative.  Negative for chills and fever.  HENT: Negative.  Negative for congestion and sore throat.   Respiratory: Negative.  Negative for cough and shortness of breath.   Cardiovascular: Negative.  Negative for chest pain and palpitations.  Gastrointestinal:  Negative for abdominal pain, diarrhea, nausea and vomiting.  Genitourinary: Negative.   Musculoskeletal:  Positive for back pain and joint pain.  Skin: Negative.  Negative for rash.  Neurological:  Negative for dizziness and headaches.  All other systems reviewed and  are negative. Today's Vitals   07/02/21 1352  BP: 112/62  Pulse: 82  SpO2: 99%  Weight: 215 lb (97.5 kg)  Height: 5\' 1"  (1.549 m)   Body mass index is 40.62 kg/m.   Physical Exam Vitals reviewed.  Constitutional:      Appearance: Normal appearance. She is obese.  HENT:     Head: Normocephalic.     Mouth/Throat:     Mouth: Mucous membranes are moist.     Pharynx: Oropharynx is clear.  Eyes:     Extraocular Movements: Extraocular movements intact.     Conjunctiva/sclera: Conjunctivae normal.     Pupils: Pupils are equal, round, and reactive to light.  Cardiovascular:     Rate and Rhythm: Normal rate and regular rhythm.     Pulses: Normal pulses.     Heart sounds: Normal heart sounds.  Pulmonary:     Effort: Pulmonary effort is normal.     Breath sounds: Normal breath sounds.  Musculoskeletal:        General: Normal range of motion.     Cervical back: Normal range of motion and neck supple.  Skin:    General: Skin is warm and dry.     Capillary Refill: Capillary refill takes less than 2 seconds.   Neurological:     General: No focal deficit present.     Mental Status: She is alert and oriented to person, place, and time.  Psychiatric:        Mood and Affect: Mood normal.        Behavior: Behavior normal.     ASSESSMENT & PLAN: Problem List Items Addressed This Visit       Cardiovascular and Mediastinum   Hypertension - Primary    Well-controlled hypertension. BP Readings from Last 3 Encounters:  07/02/21 112/62  05/29/21 125/88  05/07/21 111/78  Continue amlodipine 10 mg, losartan 50 mg, and hydrochlorothiazide 25 mg daily. Dietary approaches to control hypertension discussed.       Relevant Medications   amLODipine (NORVASC) 10 MG tablet   atorvastatin (LIPITOR) 10 MG tablet   hydrochlorothiazide (HYDRODIURIL) 25 MG tablet     Musculoskeletal and Integument   Erosive osteoarthritis of multiple sites     Other   Dyslipidemia    Stable.  Diet and nutrition discussed.  Continue atorvastatin 10 mg daily. The 10-year ASCVD risk score (Arnett DK, et al., 2019) is: 2.7%   Values used to calculate the score:     Age: 1 years     Sex: Female     Is Non-Hispanic African American: Yes     Diabetic: No     Tobacco smoker: No     Systolic Blood Pressure: 258 mmHg     Is BP treated: Yes     HDL Cholesterol: 35 mg/dL     Total Cholesterol: 140 mg/dL       Relevant Medications   atorvastatin (LIPITOR) 10 MG tablet   Chronic pain syndrome    Needs pain management referral.      Relevant Orders   Ambulatory referral to Pain Clinic   History of blood clots   Other Visit Diagnoses     Encounter to establish care       Relevant Orders   Ambulatory referral to Gynecology   Colon cancer screening       Relevant Orders   Ambulatory referral to Gastroenterology   Encounter for screening mammogram for malignant neoplasm of breast  Relevant Orders   MM Digital Screening   Elevated LDL cholesterol level       Relevant Medications   atorvastatin (LIPITOR)  10 MG tablet   Medication refill       Relevant Medications   hydrochlorothiazide (HYDRODIURIL) 25 MG tablet   omeprazole (PRILOSEC) 40 MG capsule      Patient Instructions  Health Maintenance, Female Adopting a healthy lifestyle and getting preventive care are important in promoting health and wellness. Ask your health care provider about: The right schedule for you to have regular tests and exams. Things you can do on your own to prevent diseases and keep yourself healthy. What should I know about diet, weight, and exercise? Eat a healthy diet  Eat a diet that includes plenty of vegetables, fruits, low-fat dairy products, and lean protein. Do not eat a lot of foods that are high in solid fats, added sugars, or sodium. Maintain a healthy weight Body mass index (BMI) is used to identify weight problems. It estimates body fat based on height and weight. Your health care provider can help determine your BMI and help you achieve or maintain a healthy weight. Get regular exercise Get regular exercise. This is one of the most important things you can do for your health. Most adults should: Exercise for at least 150 minutes each week. The exercise should increase your heart rate and make you sweat (moderate-intensity exercise). Do strengthening exercises at least twice a week. This is in addition to the moderate-intensity exercise. Spend less time sitting. Even light physical activity can be beneficial. Watch cholesterol and blood lipids Have your blood tested for lipids and cholesterol at 50 years of age, then have this test every 5 years. Have your cholesterol levels checked more often if: Your lipid or cholesterol levels are high. You are older than 50 years of age. You are at high risk for heart disease. What should I know about cancer screening? Depending on your health history and family history, you may need to have cancer screening at various ages. This may include screening  for: Breast cancer. Cervical cancer. Colorectal cancer. Skin cancer. Lung cancer. What should I know about heart disease, diabetes, and high blood pressure? Blood pressure and heart disease High blood pressure causes heart disease and increases the risk of stroke. This is more likely to develop in people who have high blood pressure readings or are overweight. Have your blood pressure checked: Every 3-5 years if you are 82-36 years of age. Every year if you are 87 years old or older. Diabetes Have regular diabetes screenings. This checks your fasting blood sugar level. Have the screening done: Once every three years after age 63 if you are at a normal weight and have a low risk for diabetes. More often and at a younger age if you are overweight or have a high risk for diabetes. What should I know about preventing infection? Hepatitis B If you have a higher risk for hepatitis B, you should be screened for this virus. Talk with your health care provider to find out if you are at risk for hepatitis B infection. Hepatitis C Testing is recommended for: Everyone born from [birth year] through 1965. Anyone with known risk factors for hepatitis C. Sexually transmitted infections (STIs) Get screened for STIs, including gonorrhea and chlamydia, if: You are sexually active and are younger than 50 years of age. You are older than 50 years of age and your health care provider tells you that you are at  risk for this type of infection. Your sexual activity has changed since you were last screened, and you are at increased risk for chlamydia or gonorrhea. Ask your health care provider if you are at risk. Ask your health care provider about whether you are at high risk for HIV. Your health care provider may recommend a prescription medicine to help prevent HIV infection. If you choose to take medicine to prevent HIV, you should first get tested for HIV. You should then be tested every 3 months for as long as you  are taking the medicine. Pregnancy If you are about to stop having your period (premenopausal) and you may become pregnant, seek counseling before you get pregnant. Take 400 to 800 micrograms (mcg) of folic acid every day if you become pregnant. Ask for birth control (contraception) if you want to prevent pregnancy. Osteoporosis and menopause Osteoporosis is a disease in which the bones lose minerals and strength with aging. This can result in bone fractures. If you are 22 years old or older, or if you are at risk for osteoporosis and fractures, ask your health care provider if you should: Be screened for bone loss. Take a calcium or vitamin D supplement to lower your risk of fractures. Be given hormone replacement therapy (HRT) to treat symptoms of menopause. Follow these instructions at home: Alcohol use Do not drink alcohol if: Your health care provider tells you not to drink. You are pregnant, may be pregnant, or are planning to become pregnant. If you drink alcohol: Limit how much you have to: 0-1 drink a day. Know how much alcohol is in your drink. In the [place] one drink equals one 12 oz bottle of beer (355 mL), one 5 oz glass of wine (148 mL), or one 1 oz glass of hard liquor (44 mL). Lifestyle Do not use any products that contain nicotine or tobacco. These products include cigarettes, chewing tobacco, and vaping devices, such as e-cigarettes. If you need help quitting, ask your health care provider. Do not use street drugs. Do not share needles. Ask your health care provider for help if you need support or information about quitting drugs. General instructions Schedule regular health, dental, and eye exams. Stay current with your vaccines. Tell your health care provider if: You often feel depressed. You have ever been abused or do not feel safe at home. Summary Adopting a healthy lifestyle and getting preventive care are important in promoting health and wellness. Follow your  health care provider's instructions about healthy diet, exercising, and getting tested or screened for diseases. Follow your health care provider's instructions on monitoring your cholesterol and blood pressure. This information is not intended to replace advice given to you by your health care provider. Make sure you discuss any questions you have with your health care provider. Document Revised: 12/23/2020 Document Reviewed: 12/23/2020 Elsevier Patient Education  2022 [place], MD [facility]

## 2021-07-02 NOTE — Assessment & Plan Note (Signed)
Stable.  Diet and nutrition discussed.  Continue atorvastatin 10 mg daily. The 10-year ASCVD risk score (Arnett DK, et al., 2019) is: 2.7%   Values used to calculate the score:     Age: 50 years     Sex: Female     Is Non-Hispanic African American: Yes     Diabetic: No     Tobacco smoker: No     Systolic Blood Pressure: 233 mmHg     Is BP treated: Yes     HDL Cholesterol: 35 mg/dL     Total Cholesterol: 140 mg/dL

## 2021-07-03 ENCOUNTER — Other Ambulatory Visit: Payer: Self-pay | Admitting: Psychiatry

## 2021-07-03 MED ORDER — VENLAFAXINE HCL ER 37.5 MG PO CP24: 37.5000 mg | ORAL_CAPSULE | Freq: Every day | ORAL | 2 refills | Status: DC

## 2021-07-08 ENCOUNTER — Encounter: Payer: Self-pay | Admitting: Physical Medicine and Rehabilitation

## 2021-07-24 DIAGNOSIS — G4700 Insomnia, unspecified: Secondary | ICD-10-CM | POA: Diagnosis not present

## 2021-08-04 ENCOUNTER — Other Ambulatory Visit: Payer: Self-pay | Admitting: Psychiatry

## 2021-08-04 MED ORDER — VENLAFAXINE HCL ER 75 MG PO CP24: 75.0000 mg | ORAL_CAPSULE | Freq: Every day | ORAL | 2 refills | Status: DC

## 2021-08-04 NOTE — Telephone Encounter (Signed)
Please advise 

## 2021-08-13 ENCOUNTER — Ambulatory Visit: Payer: Medicare Other | Admitting: Medical

## 2021-08-19 ENCOUNTER — Encounter: Payer: Self-pay | Admitting: Gastroenterology

## 2021-08-19 ENCOUNTER — Other Ambulatory Visit: Payer: Self-pay | Admitting: Physician Assistant

## 2021-08-19 ENCOUNTER — Encounter: Payer: Self-pay | Admitting: Psychiatry

## 2021-08-25 ENCOUNTER — Encounter: Payer: Self-pay | Admitting: Emergency Medicine

## 2021-08-25 ENCOUNTER — Other Ambulatory Visit: Payer: Self-pay | Admitting: Emergency Medicine

## 2021-08-25 MED ORDER — CYCLOBENZAPRINE HCL 5 MG PO TABS: 5.0000 mg | ORAL_TABLET | Freq: Three times a day (TID) | ORAL | 3 refills | Status: DC | PRN

## 2021-08-25 NOTE — Telephone Encounter (Signed)
New prescription sent to pharmacy of record.  Thanks.

## 2021-08-27 NOTE — Telephone Encounter (Signed)
Called pharmacy to verify that medication was refilled 08/25/21. Sent pt a my chart message.

## 2021-09-02 ENCOUNTER — Ambulatory Visit: Payer: Medicare Other | Admitting: Psychiatry

## 2021-09-02 ENCOUNTER — Telehealth: Payer: Self-pay | Admitting: Psychiatry

## 2021-09-02 NOTE — Telephone Encounter (Signed)
MD out sick- LVM and sent mychart msg informing pt.

## 2021-09-09 ENCOUNTER — Encounter: Payer: Medicare Other | Admitting: Emergency Medicine

## 2021-09-23 ENCOUNTER — Telehealth: Payer: Self-pay | Admitting: Psychiatry

## 2021-09-23 ENCOUNTER — Other Ambulatory Visit: Payer: Self-pay

## 2021-09-23 ENCOUNTER — Ambulatory Visit (INDEPENDENT_AMBULATORY_CARE_PROVIDER_SITE_OTHER): Payer: Medicare Other | Admitting: Psychiatry

## 2021-09-23 VITALS — BP 123/67 | Ht 61.0 in | Wt 213.8 lb

## 2021-09-23 DIAGNOSIS — M542 Cervicalgia: Secondary | ICD-10-CM

## 2021-09-23 DIAGNOSIS — R519 Headache, unspecified: Secondary | ICD-10-CM | POA: Diagnosis not present

## 2021-09-23 MED ORDER — VENLAFAXINE HCL ER 37.5 MG PO CP24: 37.5000 mg | ORAL_CAPSULE | Freq: Every day | ORAL | 3 refills | Status: DC

## 2021-09-23 MED ORDER — RIZATRIPTAN BENZOATE 10 MG PO TABS: 10.0000 mg | ORAL_TABLET | ORAL | 3 refills | Status: DC | PRN

## 2021-09-23 NOTE — Telephone Encounter (Signed)
[employer] medicare/medicaid order sent to [facility], [facility] they will reach out to the patient to schedule.

## 2021-09-23 NOTE — Patient Instructions (Addendum)
Take Effexor daily to help prevent migraines Start Maxalt as needed for migraines. Take at the onset of migraine. If headache recurs or does not fully resolve, you may take a second dose after 2 hours. Please avoid taking more than 2 days per week  MRI brain Physical therapy for the neck

## 2021-09-23 NOTE — Progress Notes (Signed)
° °  CC:  headaches  Follow-up Visit  Last visit: 05/29/22  Brief HPI: 51 year old female with a history of hx provoked LLE DVT and PE (2020, 2/2 ankle surgery), HTN, HLD, glaucoma who follows in clinic for chronic migraines.  At her last visit she was prescribed Ajovy for prevention. Imitrex was increased to 100 mg PRN for rescue.  Interval History: Headaches have worsened in severity since her last visit. Ajovy was denied by insurance so she was started on Effexor for prevention. She never picked this up and is currently not taking anything for headache prevention.  Doesn't feel like increasing Imitrex made a difference. It takes a long time to start working and she always needs a second dose. Even after two doses it does not relieve the headache, only takes the edge off.   Headache days per month: 30 Headache free days per month: 0  Current Headache Regimen: Preventative: none Abortive: Imitrex 100 mg    Prior Therapies                                  Imitrex 100 mg  BC powder Topamax 25 mg BID  Losartan 50 mg Flexeril 5 mg PRN  Physical Exam:   Vital Signs: BP 123/67    Ht 5\' 1"  (1.549 m)    Wt 213 lb 12.8 oz (97 kg)    BMI 40.40 kg/m  GENERAL:  well appearing, in no acute distress, alert  SKIN:  Color, texture, turgor normal. No rashes or lesions HEAD:  Normocephalic/atraumatic. RESP: normal respiratory effort MSK:  +tenderness to palpation over bilateral occiput, neck, and shoulders  NEUROLOGICAL: Mental Status: Alert, oriented to person, place and time, Follows commands, and Speech fluent and appropriate. Cranial Nerves: PERRL, face symmetric, no dysarthria, hearing grossly intact Motor: moves all extremities equally Gait: normal-based.  IMPRESSION: 51 year old female with a history of provoked LLE DVT and PE (2020, 2/2 ankle surgery), HTN, HLD, glaucoma who presents for follow up of migraines. Headaches have continued to worsen. Will order MRI brain to  evaluation for structural causes of worsening headache in 51 year old. She will pick up the Effexor today. Will switch Imitrex to Maxalt and see if this is more effective for rescue. Referral to neck PT placed for cervicalgia.  PLAN: -MRI brain with contrast -Preventive: Start Effexor 37.5 mg daily -Rescue: Stop Imitrex. Start Maxalt 10 mg PRN -Referral to neck PT -next steps: consider CGRP, Botox, consider gepant for rescue   Follow-up: 3 months  I spent a total of 25 minutes on the date of the service. Headache education was done. Discussed treatment options including preventive and acute medications,and physical therapy.  Discussed medication side effects, adverse reactions and drug interactions. Written educational materials and patient instructions outlining all of the above were given.  Genia Harold, MD 09/23/21 1:50 PM

## 2021-09-24 ENCOUNTER — Ambulatory Visit (AMBULATORY_SURGERY_CENTER): Payer: Medicare Other | Admitting: *Deleted

## 2021-09-24 ENCOUNTER — Encounter: Payer: Self-pay | Admitting: Nurse Practitioner

## 2021-09-24 ENCOUNTER — Other Ambulatory Visit: Payer: Self-pay

## 2021-09-24 ENCOUNTER — Other Ambulatory Visit (HOSPITAL_COMMUNITY)
Admission: RE | Admit: 2021-09-24 | Discharge: 2021-09-24 | Disposition: A | Discharge status: Home / Self Care | Payer: Medicare Other | Source: Ambulatory Visit | Attending: Medical | Admitting: Medical

## 2021-09-24 ENCOUNTER — Ambulatory Visit (INDEPENDENT_AMBULATORY_CARE_PROVIDER_SITE_OTHER): Payer: Medicare Other | Admitting: Nurse Practitioner

## 2021-09-24 VITALS — BP 117/82 | HR 64 | Ht 61.5 in | Wt 211.0 lb

## 2021-09-24 VITALS — Ht 61.0 in | Wt 211.0 lb

## 2021-09-24 DIAGNOSIS — N951 Menopausal and female climacteric states: Secondary | ICD-10-CM | POA: Diagnosis not present

## 2021-09-24 DIAGNOSIS — Z01419 Encounter for gynecological examination (general) (routine) without abnormal findings: Secondary | ICD-10-CM

## 2021-09-24 DIAGNOSIS — N393 Stress incontinence (female) (male): Secondary | ICD-10-CM | POA: Diagnosis not present

## 2021-09-24 DIAGNOSIS — N898 Other specified noninflammatory disorders of vagina: Secondary | ICD-10-CM

## 2021-09-24 DIAGNOSIS — Z6839 Body mass index (BMI) 39.0-39.9, adult: Secondary | ICD-10-CM

## 2021-09-24 DIAGNOSIS — Z800 Family history of malignant neoplasm of digestive organs: Secondary | ICD-10-CM

## 2021-09-24 DIAGNOSIS — R4586 Emotional lability: Secondary | ICD-10-CM

## 2021-09-24 MED ORDER — NA SULFATE-K SULFATE-MG SULF 17.5-3.13-1.6 GM/177ML PO SOLN: 1.0000 | ORAL | 0 refills | Status: DC

## 2021-09-24 NOTE — Progress Notes (Signed)
Pt c/o of vaginal itching.

## 2021-09-24 NOTE — Progress Notes (Signed)
GYNECOLOGY OFFICE VISIT NOTE   History:  51 y.o. [id number] here today for GYN exam.  Pap not yet due. She had one menses this year.  Just finished.  Reports problems with night sweats to the point that the she and the bed are wet and she has to changed the sheets.  Has been happening about one year.  Currently having itching of the genital area for about one week.  She has not had a mammogram since 2019.  She denies any abnormal vaginal discharge, bleeding, pelvic pain or other concerns.  She has a PCP who manages her chronic medical conditions.  Problem list reviewed.  Past Medical History:  Diagnosis Date   Anemia    low iron during pregnancy   Anxiety    Arthritis    Asthma    as a child   Bipolar disorder ([place])    Depression    GERD (gastroesophageal reflux disease)    Headache    Hypertension    Left trimalleolar fracture    Pneumonia    as a child   PONV (postoperative nausea and vomiting)    Restless legs     Past Surgical History:  Procedure Laterality Date   BILATERAL ANTERIOR TOTAL HIP ARTHROPLASTY Bilateral 03/25/2017   Procedure: BILATERAL ANTERIOR TOTAL HIP ARTHROPLASTY;  Surgeon: Leandrew Koyanagi, MD;  Location: [facility];  Service: Orthopedics;  Laterality: Bilateral;   DENTAL SURGERY     all teeth extracted   INTRAVASCULAR ULTRASOUND/IVUS N/A 06/09/2019   Procedure: INTRAVASCULAR ULTRASOUND/IVUS;  Surgeon: Elam Dutch, MD;  Location: [facility];  Service: Cardiovascular;  Laterality: N/A;   LAMINECTOMY AND MICRODISCECTOMY LUMBAR SPINE   06/27/2010   ORIF ANKLE FRACTURE Left 03/17/2019   Procedure: OPEN REDUCTION INTERNAL FIXATION (ORIF) LEFT ANKLE FRACTURE;  Surgeon: Leandrew Koyanagi, MD;  Location: [facility];  Service: Orthopedics;  Laterality: Left;   PERIPHERAL VASCULAR INTERVENTION Left 06/09/2019   Procedure: PERIPHERAL VASCULAR INTERVENTION;  Surgeon: Elam Dutch, MD;  Location: [facility];  Service: Cardiovascular;   Laterality: Left;  Common Iliac vein   PERIPHERAL VASCULAR THROMBECTOMY Left 06/09/2019   Procedure: PERIPHERAL VASCULAR THROMBECTOMY;  Surgeon: Elam Dutch, MD;  Location: [facility];  Service: Cardiovascular;  Laterality: Left;   TUBAL LIGATION Bilateral     The following portions of the patient's history were reviewed and updated as appropriate: allergies, current medications, past family history, past medical history, past social history, past surgical history and problem list.   Health Maintenance:  Normal pap 07-10-12.   Mammogram Scheduled.   Review of Systems:  Pertinent items noted in HPI and remainder of comprehensive ROS otherwise negative.  Objective:  Physical Exam BP 117/82    Pulse 64    Ht 5' 1.5" (1.562 m)    Wt 211 lb (95.7 kg)    LMP 09/17/2021 (Approximate)    BMI 39.22 kg/m  CONSTITUTIONAL: Well-developed, well-nourished female in no acute distress.  HENT:  Normocephalic, atraumatic. External right and left ear normal.  EYES: Conjunctivae and EOM are normal. Pupils are equal, round.  No scleral icterus.  NECK: Normal range of motion, supple, no masses SKIN: Skin is warm and dry. No rash noted. Not diaphoretic. No erythema. No pallor. NEUROLOGIC: Alert and oriented to person, place, and time. Normal muscle tone coordination. No cranial nerve deficit noted. PSYCHIATRIC: Normal mood and affect. Normal behavior. Normal judgment and thought content. CARDIOVASCULAR: Normal heart rate noted RESPIRATORY: Effort  and breath sounds normal, no problems with respiration noted ABDOMEN: Soft, no distention noted.   PELVIC: Normal appearing external genitalia; normal appearing vaginal mucosa and cervix.  No abnormal discharge noted.  Normal uterine size, no other palpable masses, no uterine or adnexal tenderness. MUSCULOSKELETAL: Normal range of motion. No edema noted.  Labs and Imaging No results found.  Assessment & Plan:  1. Encounter for annual routine  gynecological examination Has a PCP for most of her medical care Has had recurrent BV but no active symptoms today  - MM 3D SCREEN BREAST BILATERAL; Future - Cervicovaginal ancillary only  2. BMI 39.0-39.9,adult   3. Stress incontinence of urine Reviewed she will get a call for the appointment - Ambulatory referral to Physical Therapy  4. Mood swings Will refer for evaluation in the office - Ambulatory referral to [facility]  5. Vaginal itching Has external itching on vulva near outer edge of labia majora Just off menses and has been wearing pads Advised only loose clothing at night - not underwear at night No rash or skin changes seen  6.  Perimenopausal vasomotor symptoms Just started Effexor - expect to help with symptoms in 5-6 weeks  Routine preventative health maintenance measures emphasized. Please refer to After Visit Summary for other counseling recommendations.   Return in about 1 year (around 09/24/2022) for annual exam.   Total face-to-face time with patient: 30 minutes.  Over 50% of encounter was spent on counseling and coordination of care.  Earlie Server, RN, MSN, NP-BC Nurse Practitioner, [facility] for [employer], [facility] 09/24/2021 11:09 AM

## 2021-09-24 NOTE — Progress Notes (Signed)
Patient's pre-visit was done today over the phone with the patient. Name,DOB and address verified. Patient denies any allergies to Eggs and Soy. Patient is allergic to Propofol. Patient is not taking any diet pills or blood thinners. No home Oxygen.   Prep instructions sent to pt's MyChart-pt aware. Patient understands to call us back with any questions or concerns. Patient is aware of our care-partner policy and [id number] safety protocol.   EMMI education assigned to the patient for the procedure, sent to [facility].

## 2021-09-25 LAB — CERVICOVAGINAL ANCILLARY ONLY
Bacterial Vaginitis (gardnerella): NEGATIVE
Candida Glabrata: NEGATIVE
Candida Vaginitis: NEGATIVE
Chlamydia: NEGATIVE
Comment: NEGATIVE
Comment: NEGATIVE
Comment: NEGATIVE
Comment: NEGATIVE
Comment: NEGATIVE
Comment: NORMAL
Neisseria Gonorrhea: NEGATIVE
Trichomonas: NEGATIVE

## 2021-10-07 ENCOUNTER — Other Ambulatory Visit: Payer: Self-pay

## 2021-10-07 ENCOUNTER — Encounter (HOSPITAL_BASED_OUTPATIENT_CLINIC_OR_DEPARTMENT_OTHER): Payer: Self-pay | Admitting: Radiology

## 2021-10-07 ENCOUNTER — Ambulatory Visit (HOSPITAL_BASED_OUTPATIENT_CLINIC_OR_DEPARTMENT_OTHER)
Admission: RE | Admit: 2021-10-07 | Discharge: 2021-10-07 | Disposition: A | Discharge status: Home / Self Care | Payer: Medicare Other | Source: Ambulatory Visit | Attending: Nurse Practitioner | Admitting: Nurse Practitioner

## 2021-10-07 DIAGNOSIS — Z1231 Encounter for screening mammogram for malignant neoplasm of breast: Secondary | ICD-10-CM | POA: Insufficient documentation

## 2021-10-07 DIAGNOSIS — Z01419 Encounter for gynecological examination (general) (routine) without abnormal findings: Secondary | ICD-10-CM | POA: Insufficient documentation

## 2021-10-08 ENCOUNTER — Ambulatory Visit (AMBULATORY_SURGERY_CENTER): Payer: Medicare Other | Admitting: Gastroenterology

## 2021-10-08 ENCOUNTER — Encounter: Payer: Self-pay | Admitting: Gastroenterology

## 2021-10-08 VITALS — BP 140/66 | HR 60 | Temp 98.4°F | Resp 15 | Ht 61.0 in | Wt 211.0 lb

## 2021-10-08 DIAGNOSIS — D124 Benign neoplasm of descending colon: Secondary | ICD-10-CM | POA: Diagnosis not present

## 2021-10-08 DIAGNOSIS — D123 Benign neoplasm of transverse colon: Secondary | ICD-10-CM | POA: Diagnosis not present

## 2021-10-08 DIAGNOSIS — K6389 Other specified diseases of intestine: Secondary | ICD-10-CM | POA: Diagnosis not present

## 2021-10-08 DIAGNOSIS — Z800 Family history of malignant neoplasm of digestive organs: Secondary | ICD-10-CM

## 2021-10-08 DIAGNOSIS — Z1211 Encounter for screening for malignant neoplasm of colon: Secondary | ICD-10-CM | POA: Diagnosis not present

## 2021-10-08 MED ORDER — SODIUM CHLORIDE 0.9 % IV SOLN: 500.0000 mL | Freq: Once | INTRAVENOUS | Status: DC

## 2021-10-08 NOTE — Progress Notes (Signed)
[facility] History and Physical   Primary Care Physician:  Kerin Perna, NP   Reason for Procedure:  Colorectal cancer screening, family history of colon cancer  Plan:    Screening colonoscopy with possible interventions as needed     HPI: Patricia Davila is a very pleasant 51 y.o. female here for screening colonoscopy. Denies any nausea, vomiting, abdominal pain, melena or bright red blood per rectum  The risks and benefits as well as alternatives of endoscopic procedure(s) have been discussed and reviewed. All questions answered. The patient agrees to proceed.    Past Medical History:  Diagnosis Date   Anemia    low iron during pregnancy   Anxiety    Arthritis    Asthma    as a child   Bipolar disorder ([place])    Depression    GERD (gastroesophageal reflux disease)    Headache    Hypertension    Left trimalleolar fracture    Pneumonia    as a child   PONV (postoperative nausea and vomiting)    Restless legs     Past Surgical History:  Procedure Laterality Date   BILATERAL ANTERIOR TOTAL HIP ARTHROPLASTY Bilateral 03/25/2017   Procedure: BILATERAL ANTERIOR TOTAL HIP ARTHROPLASTY;  Surgeon: Leandrew Koyanagi, MD;  Location: [facility];  Service: Orthopedics;  Laterality: Bilateral;   DENTAL SURGERY     all teeth extracted   INTRAVASCULAR ULTRASOUND/IVUS N/A 06/09/2019   Procedure: INTRAVASCULAR ULTRASOUND/IVUS;  Surgeon: Elam Dutch, MD;  Location: [facility];  Service: Cardiovascular;  Laterality: N/A;   LAMINECTOMY AND MICRODISCECTOMY LUMBAR SPINE   06/27/2010   ORIF ANKLE FRACTURE Left 03/17/2019   Procedure: OPEN REDUCTION INTERNAL FIXATION (ORIF) LEFT ANKLE FRACTURE;  Surgeon: Leandrew Koyanagi, MD;  Location: [facility];  Service: Orthopedics;  Laterality: Left;   PERIPHERAL VASCULAR INTERVENTION Left 06/09/2019   Procedure: PERIPHERAL VASCULAR INTERVENTION;  Surgeon: Elam Dutch, MD;  Location: [facility];  Service:  Cardiovascular;  Laterality: Left;  Common Iliac vein   PERIPHERAL VASCULAR THROMBECTOMY Left 06/09/2019   Procedure: PERIPHERAL VASCULAR THROMBECTOMY;  Surgeon: Elam Dutch, MD;  Location: [facility];  Service: Cardiovascular;  Laterality: Left;   TUBAL LIGATION Bilateral     Prior to Admission medications   Medication Sig Start Date End Date Taking? Authorizing Provider  amLODipine (NORVASC) 10 MG tablet Take 1 tablet (10 mg total) by mouth daily. 07/02/21  Yes Horald Pollen, MD  aspirin EC 81 MG tablet Take 81 mg by mouth daily. Swallow whole.   Yes [provider]  atorvastatin (LIPITOR) 10 MG tablet Take 1 tablet (10 mg total) by mouth daily. 07/02/21  Yes Sagardia, Ines Bloomer, MD  cyclobenzaprine (FLEXERIL) 5 MG tablet Take 1 tablet (5 mg total) by mouth 3 (three) times daily as needed for muscle spasms. TAKE 1 TO 2 TABLETS(5 TO 10 MG) BY MOUTH THREE TIMES DAILY AS NEEDED FOR MUSCLE SPASMS 08/25/21  Yes Horald Pollen, MD  hydrochlorothiazide (HYDRODIURIL) 25 MG tablet Take 1 tablet daily 07/02/21  Yes Sagardia, Ines Bloomer, MD  losartan (COZAAR) 50 MG tablet Take 1 tablet (50 mg total) by mouth daily. 05/07/21  Yes Kerin Perna, NP  Multiple Vitamin (MULTIVITAMIN) tablet Take 1 tablet by mouth daily.   Yes [provider]  omeprazole (PRILOSEC) 40 MG capsule Take 1 capsule (40 mg total) by mouth daily. 07/02/21  Yes Horald Pollen, MD  venlafaxine XR (  EFFEXOR XR) 37.5 MG 24 hr capsule Take 1 capsule (37.5 mg total) by mouth daily with breakfast. 09/23/21  Yes Chima, Anderson Malta, MD  Vitamin D, Ergocalciferol, (DRISDOL) 1.25 MG (50000 UNIT) CAPS capsule Take 1 capsule (50,000 Units total) by mouth every 7 (seven) days. 05/22/21  Yes Gildardo Pounds, NP  Cholecalciferol (VITAMIN D3) 50 MCG (2000 UT) TABS TAKE 1 TABLET BY MOUTH EVERY DAY 08/14/19   Kerin Perna, NP  rizatriptan (MAXALT) 10 MG tablet Take 1 tablet (10 mg total) by  mouth as needed for migraine. May repeat in 2 hours if needed 09/23/21   Genia Harold, MD    Current Outpatient Medications  Medication Sig Dispense Refill   amLODipine (NORVASC) 10 MG tablet Take 1 tablet (10 mg total) by mouth daily. 90 tablet 1   aspirin EC 81 MG tablet Take 81 mg by mouth daily. Swallow whole.     atorvastatin (LIPITOR) 10 MG tablet Take 1 tablet (10 mg total) by mouth daily. 90 tablet 3   cyclobenzaprine (FLEXERIL) 5 MG tablet Take 1 tablet (5 mg total) by mouth 3 (three) times daily as needed for muscle spasms. TAKE 1 TO 2 TABLETS(5 TO 10 MG) BY MOUTH THREE TIMES DAILY AS NEEDED FOR MUSCLE SPASMS 30 tablet 3   hydrochlorothiazide (HYDRODIURIL) 25 MG tablet Take 1 tablet daily 90 tablet 1   losartan (COZAAR) 50 MG tablet Take 1 tablet (50 mg total) by mouth daily. 90 tablet 1   Multiple Vitamin (MULTIVITAMIN) tablet Take 1 tablet by mouth daily.     omeprazole (PRILOSEC) 40 MG capsule Take 1 capsule (40 mg total) by mouth daily. 90 capsule 3   venlafaxine XR (EFFEXOR XR) 37.5 MG 24 hr capsule Take 1 capsule (37.5 mg total) by mouth daily with breakfast. 30 capsule 3   Vitamin D, Ergocalciferol, (DRISDOL) 1.25 MG (50000 UNIT) CAPS capsule Take 1 capsule (50,000 Units total) by mouth every 7 (seven) days. 12 capsule 1   Cholecalciferol (VITAMIN D3) 50 MCG (2000 UT) TABS TAKE 1 TABLET BY MOUTH EVERY DAY 30 tablet 2   rizatriptan (MAXALT) 10 MG tablet Take 1 tablet (10 mg total) by mouth as needed for migraine. May repeat in 2 hours if needed 10 tablet 3   Current Facility-Administered Medications  Medication Dose Route Frequency Provider Last Rate Last Admin   0.9 %  sodium chloride infusion  500 mL Intravenous Once Mauri Pole, MD        Allergies as of 10/08/2021 - Review Complete 10/08/2021  Allergen Reaction Noted   Ativan [lorazepam] Anxiety 06/29/2015   Anesthesia s-i-40 [propofol]  07/05/2019   Ditropan [oxybutynin]  12/27/2020   Tylenol with codeine #3  [acetaminophen-codeine] Hives and Itching 06/29/2015    Family History  Problem Relation Age of Onset   Stomach cancer Mother    HIV Mother    Cancer Mother    Colon cancer Sister 70   Breast cancer Neg Hx    Rectal cancer Neg Hx     Social History   Socioeconomic History   Marital status: Married    Spouse name: Not on file   Number of children: Not on file   Years of education: Not on file   Highest education level: Not on file  Occupational History   Not on file  Tobacco Use   Smoking status: Former    Packs/day: 0.25    Types: Cigarettes   Smokeless tobacco: Never  Vaping Use   Vaping Use:  Never used  Substance and Sexual Activity   Alcohol use: No   Drug use: No   Sexual activity: Yes    Birth control/protection: Surgical    Comment: BTL  Other Topics Concern   Not on file  Social History Narrative   Not on file   Social Determinants of Health   Financial Resource Strain: Not on file  Food Insecurity: Not on file  Transportation Needs: Not on file  Physical Activity: Not on file  Stress: Not on file  Social Connections: Not on file  Intimate Partner Violence: Not on file    Review of Systems:  All other review of systems negative except as mentioned in the HPI.  Physical Exam: Vital signs in last 24 hours: BP 132/83    Pulse 62    Temp 98.4 F (36.9 C)    Ht 5\' 1"  (1.549 m)    Wt 211 lb (95.7 kg)    LMP 09/17/2021 (Approximate)    SpO2 100%    BMI 39.87 kg/m  General:   Alert, NAD Lungs:  Clear .   Heart:  Regular rate and rhythm Abdomen:  Soft, nontender and nondistended. Neuro/Psych:  Alert and cooperative. Normal mood and affect. A and O x 3  Reviewed labs, radiology imaging, old records and pertinent past GI work up  Patient is appropriate for planned procedure(s) and anesthesia in an ambulatory setting   K. Denzil Magnuson , MD [phone]

## 2021-10-08 NOTE — Patient Instructions (Signed)
Please read handouts provided. Continue present medications. Await pathology results. Repeat colonoscopy in 5 years for screening.   YOU HAD AN ENDOSCOPIC PROCEDURE TODAY AT [facility]:   Refer to the procedure report that was given to you for any specific questions about what was found during the examination.  If the procedure report does not answer your questions, please call your gastroenterologist to clarify.  If you requested that your care partner not be given the details of your procedure findings, then the procedure report has been included in a sealed envelope for you to review at your convenience later.  YOU SHOULD EXPECT: Some feelings of bloating in the abdomen. Passage of more gas than usual.  Walking can help get rid of the air that was put into your GI tract during the procedure and reduce the bloating. If you had a lower endoscopy (such as a colonoscopy or flexible sigmoidoscopy) you may notice spotting of blood in your stool or on the toilet paper. If you underwent a bowel prep for your procedure, you may not have a normal bowel movement for a few days.  Please Note:  You might notice some irritation and congestion in your nose or some drainage.  This is from the oxygen used during your procedure.  There is no need for concern and it should clear up in a day or so.  SYMPTOMS TO REPORT IMMEDIATELY:  Following lower endoscopy (colonoscopy or flexible sigmoidoscopy):  Excessive amounts of blood in the stool  Significant tenderness or worsening of abdominal pains  Swelling of the abdomen that is new, acute  Fever of 100F or higher   For urgent or emergent issues, a gastroenterologist can be reached at any hour by calling [phone]. Do not use MyChart messaging for urgent concerns.    DIET:  We do recommend a small meal at first, but then you may proceed to your regular diet.  Drink plenty of fluids but you should avoid alcoholic beverages for 24  hours.  ACTIVITY:  You should plan to take it easy for the rest of today and you should NOT DRIVE or use heavy machinery until tomorrow (because of the sedation medicines used during the test).    FOLLOW UP: Our staff will call the number listed on your records 48-72 hours following your procedure to check on you and address any questions or concerns that you may have regarding the information given to you following your procedure. If we do not reach you, we will leave a message.  We will attempt to reach you two times.  During this call, we will ask if you have developed any symptoms of COVID 19. If you develop any symptoms (ie: fever, flu-like symptoms, shortness of breath, cough etc.) before then, please call [phone].  If you test positive for Covid 19 in the 2 weeks post procedure, please call and report this information to [place].    If any biopsies were taken you will be contacted by phone or by letter within the next 1-3 weeks.  Please call us at [phone] if you have not heard about the biopsies in 3 weeks.    SIGNATURES/CONFIDENTIALITY: You and/or your care partner have signed paperwork which will be entered into your electronic medical record.  These signatures attest to the fact that that the information above on your After Visit Summary has been reviewed and is understood.  Full responsibility of the confidentiality of this discharge information lies with you and/or your care-partner.

## 2021-10-08 NOTE — Op Note (Signed)
[name]: Patricia Davila Procedure Date: 10/08/2021 11:17 AM MRN: [id number] Endoscopist: Mauri Pole , MD Age: 51 Referring MD:  Date of Birth: [birth year] Gender: Female Account #: [id number] Procedure:                Colonoscopy Indications:              Screening in patient at increased risk: Family                            history of 1st-degree relative with colorectal                            cancer Medicines:                Monitored Anesthesia Care Procedure:                Pre-Anesthesia Assessment:                           - Prior to the procedure, a History and Physical                            was performed, and patient medications and                            allergies were reviewed. The patient's tolerance of                            previous anesthesia was also reviewed. The risks                            and benefits of the procedure and the sedation                            options and risks were discussed with the patient.                            All questions were answered, and informed consent                            was obtained. Prior Anticoagulants: The patient has                            taken no previous anticoagulant or antiplatelet                            agents. ASA Grade Assessment: III - A patient with                            severe systemic disease. After reviewing the risks                            and benefits, the patient was deemed in  satisfactory condition to undergo the procedure.                           After obtaining informed consent, the colonoscope                            was passed under direct vision. Throughout the                            procedure, the patient's blood pressure, pulse, and                            oxygen saturations were monitored continuously. The                            Olympus PCF-H190DL ([id number]) Colonoscope was                             introduced through the anus and advanced to the the                            cecum, identified by appendiceal orifice and                            ileocecal valve. The colonoscopy was performed                            without difficulty. The patient tolerated the                            procedure well. The quality of the bowel                            preparation was excellent. The ileocecal valve,                            appendiceal orifice, and rectum were photographed. Scope In: 11:34:28 AM Scope Out: 11:46:20 AM Scope Withdrawal Time: 0 hours 8 minutes 49 seconds  Total Procedure Duration: 0 hours 11 minutes 52 seconds  Findings:                 The perianal and digital rectal examinations were                            normal.                           A 5 mm polyp was found in the transverse colon. The                            polyp was sessile. The polyp was removed with a                            cold snare. Resection and retrieval were complete.  A 2 mm polyp was found in the descending colon. The                            polyp was sessile. The polyp was removed with a                            cold biopsy forceps. Resection and retrieval were                            complete.                           Non-bleeding external and internal hemorrhoids were                            found during retroflexion. The hemorrhoids were                            medium-sized. Complications:            No immediate complications. Estimated Blood Loss:     Estimated blood loss was minimal. Impression:               - One 5 mm polyp in the transverse colon, removed                            with a cold snare. Resected and retrieved.                           - One 2 mm polyp in the descending colon, removed                            with a cold biopsy forceps. Resected and retrieved.                           -  Non-bleeding external and internal hemorrhoids. Recommendation:           - Patient has a contact number available for                            emergencies. The signs and symptoms of potential                            delayed complications were discussed with the                            patient. Return to normal activities tomorrow.                            Written discharge instructions were provided to the                            patient.                           -  Resume previous diet.                           - Continue present medications.                           - Await pathology results.                           - Repeat colonoscopy in 5 years for surveillance                            based on pathology results. Mauri Pole, MD 10/08/2021 12:02:02 PM This report has been signed electronically.

## 2021-10-08 NOTE — Progress Notes (Signed)
To pacu, VSS. Report to [email or web address] °

## 2021-10-08 NOTE — Progress Notes (Signed)
Called to room to assist during endoscopic procedure.  Patient ID and intended procedure confirmed with present staff. Received instructions for my participation in the procedure from the performing physician.  

## 2021-10-08 NOTE — Progress Notes (Signed)
Pt's states no medical or surgical changes since previsit or office visit. VS by CW. 

## 2021-10-10 ENCOUNTER — Telehealth: Payer: Self-pay

## 2021-10-10 NOTE — Telephone Encounter (Signed)
°  Follow up Call-  Call back number 10/08/2021  Post procedure Call Back phone  # [phone]  Permission to leave phone message Yes  Some recent data might be hidden     Patient questions:  Do you have a fever, pain , or abdominal swelling? No. Pain Score  0 *  Have you tolerated food without any problems? Yes.    Have you been able to return to your normal activities? Yes.    Do you have any questions about your discharge instructions: Diet   No. Medications  No. Follow up visit  No.  Do you have questions or concerns about your Care? No.  Actions: * If pain score is 4 or above: No action needed, pain <4.

## 2021-10-13 ENCOUNTER — Encounter: Payer: Self-pay | Admitting: Physical Medicine and Rehabilitation

## 2021-10-13 ENCOUNTER — Encounter
Payer: Medicare Other | Attending: Physical Medicine and Rehabilitation | Admitting: Physical Medicine and Rehabilitation

## 2021-10-13 ENCOUNTER — Other Ambulatory Visit: Payer: Self-pay

## 2021-10-13 VITALS — BP 122/83 | HR 81 | Ht 61.0 in | Wt 208.0 lb

## 2021-10-13 DIAGNOSIS — G894 Chronic pain syndrome: Secondary | ICD-10-CM | POA: Diagnosis not present

## 2021-10-13 DIAGNOSIS — M154 Erosive (osteo)arthritis: Secondary | ICD-10-CM | POA: Insufficient documentation

## 2021-10-13 DIAGNOSIS — S82852G Displaced trimalleolar fracture of left lower leg, subsequent encounter for closed fracture with delayed healing: Secondary | ICD-10-CM | POA: Insufficient documentation

## 2021-10-13 DIAGNOSIS — M5137 Other intervertebral disc degeneration, lumbosacral region: Secondary | ICD-10-CM | POA: Insufficient documentation

## 2021-10-13 MED ORDER — DULOXETINE HCL 30 MG PO CPEP: 30.0000 mg | ORAL_CAPSULE | Freq: Every day | ORAL | 5 refills | Status: DC

## 2021-10-13 MED ORDER — ONDANSETRON HCL 4 MG PO TABS: 4.0000 mg | ORAL_TABLET | Freq: Three times a day (TID) | ORAL | 1 refills | Status: DC | PRN

## 2021-10-13 MED ORDER — LIDOCAINE 5 % EX OINT: 1.0000 "application " | TOPICAL_OINTMENT | Freq: Four times a day (QID) | CUTANEOUS | 5 refills | Status: DC

## 2021-10-13 NOTE — Progress Notes (Signed)
Subjective:    Patient ID: Patricia Davila, female    DOB: 1971-07-14, 51 y.o.   MRN: [id number]  HPI Pt is a 51 yr old female with hx of back surgery in 2011; s/p laminectomy- s/p B/L hip replacements- anterior; and provoked PE/LLE DVT- s/p L ankle surgery- also has chronic migraines; also has glaucoma;  Here for evaluation of chronic pain.   L ankle surgery- has a rod and screws of L ankle- secondary to a fracture- 2019? Lumbar laminectomy due to slipped disc 06/27/10 B/L anterior hip replacement- 2016/2017   Main pain is stabbing in L ankle and hip pain? But really hurts in anterior thighs. No swelling. Feels like "materials put in wrong".  Has a lot of burning and absent sensation of L ankle.  And stabbing and swelling in L ankle.   Sometimes gets off balance.   Was told they did xrays after surgery for hips- and they aren't loosening.  So was discharged from Ortho-  Dr Erlinda Hong.   Has had pain a long time.  Takes BC powder and ASA and stomach is torn up.   Has apt to see GI-.   Tried: Tramadol helped Some when tried in past.  Has only gotten meds when left hospital.  Never tried Cymbalta/Gabapentin, and nerve pain meds   Pain Inventory Average Pain 9 Pain Right Now 8 My pain is constant, burning, and stabbing  In the last 24 hours, has pain interfered with the following? General activity 4 Relation with others 4 Enjoyment of life 4 What TIME of day is your pain at its worst? varies Sleep (in general) Fair  Pain is worse with: walking and bending Pain improves with: rest, heat/ice, and medication Relief from Meds: 6  how many minutes can you walk? 10-15 ability to climb steps?  yes do you drive?  yes  disabled: date disabled . I need assistance with the following:  dressing, meal prep, household duties, and shopping  weakness numbness tingling spasms  New pt  New pt    Family History  Problem Relation Age of Onset   Stomach cancer Mother    HIV Mother     Cancer Mother    Colon cancer Sister 70   Breast cancer Neg Hx    Rectal cancer Neg Hx    Social History   Socioeconomic History   Marital status: Married    Spouse name: Not on file   Number of children: Not on file   Years of education: Not on file   Highest education level: Not on file  Occupational History   Not on file  Tobacco Use   Smoking status: Former    Packs/day: 0.25    Types: Cigarettes   Smokeless tobacco: Never  Vaping Use   Vaping Use: Never used  Substance and Sexual Activity   Alcohol use: No   Drug use: No   Sexual activity: Yes    Birth control/protection: Surgical    Comment: BTL  Other Topics Concern   Not on file  Social History Narrative   Not on file   Social Determinants of Health   Financial Resource Strain: Not on file  Food Insecurity: Not on file  Transportation Needs: Not on file  Physical Activity: Not on file  Stress: Not on file  Social Connections: Not on file   Past Surgical History:  Procedure Laterality Date   BILATERAL ANTERIOR TOTAL HIP ARTHROPLASTY Bilateral 03/25/2017   Procedure: BILATERAL ANTERIOR TOTAL HIP ARTHROPLASTY;  Surgeon: Leandrew Koyanagi, MD;  Location: [facility];  Service: Orthopedics;  Laterality: Bilateral;   DENTAL SURGERY     all teeth extracted   INTRAVASCULAR ULTRASOUND/IVUS N/A 06/09/2019   Procedure: INTRAVASCULAR ULTRASOUND/IVUS;  Surgeon: Elam Dutch, MD;  Location: [facility] [place] [facility];  Service: Cardiovascular;  Laterality: N/A;   LAMINECTOMY AND MICRODISCECTOMY LUMBAR SPINE   06/27/2010   ORIF ANKLE FRACTURE Left 03/17/2019   Procedure: OPEN REDUCTION INTERNAL FIXATION (ORIF) LEFT ANKLE FRACTURE;  Surgeon: Leandrew Koyanagi, MD;  Location: [place];  Service: Orthopedics;  Laterality: Left;   PERIPHERAL VASCULAR INTERVENTION Left 06/09/2019   Procedure: PERIPHERAL VASCULAR INTERVENTION;  Surgeon: Elam Dutch, MD;  Location: [place] [facility];  Service: Cardiovascular;   Laterality: Left;  Common Iliac vein   PERIPHERAL VASCULAR THROMBECTOMY Left 06/09/2019   Procedure: PERIPHERAL VASCULAR THROMBECTOMY;  Surgeon: Elam Dutch, MD;  Location: [place];  Service: Cardiovascular;  Laterality: Left;   TUBAL LIGATION Bilateral    Past Medical History:  Diagnosis Date   Anemia    low iron during pregnancy   Anxiety    Arthritis    Asthma    as a child   Bipolar disorder ([place])    Depression    GERD (gastroesophageal reflux disease)    Headache    Hypertension    Left trimalleolar fracture    Pneumonia    as a child   PONV (postoperative nausea and vomiting)    Restless legs    BP 122/83    Pulse 81    Ht 5\' 1"  (1.549 m)    Wt 208 lb (94.3 kg)    LMP 09/17/2021 (Approximate)    SpO2 98%    BMI 39.30 kg/m   Opioid Risk Score:   Fall Risk Score:  `1  Depression screen PHQ 2/9  Depression screen [facility] 2/9 10/13/2021 05/07/2021 04/23/2021 10/23/2020 05/23/2020 11/09/2019 09/19/2019  Decreased Interest 1 0 0 0 2 0 0  Down, Depressed, Hopeless 0 0 0 0 0 0 0  PHQ - 2 Score 1 0 0 0 2 0 0  Altered sleeping 1 - - - 0 - -  Tired, decreased energy 0 - - - 3 - -  Change in appetite 0 - - - 0 - -  Feeling bad or failure about yourself  0 - - - 0 - -  Trouble concentrating 0 - - - 0 - -  Moving slowly or fidgety/restless 0 - - - 0 - -  Suicidal thoughts 0 - - - 0 - -  PHQ-9 Score 2 - - - 5 - -  Difficult doing work/chores Not difficult at all - - - Not difficult at all - -     Review of Systems  Musculoskeletal:  Positive for back pain and joint swelling.       Spasms Bilateral hip pain Left ankle pain  Neurological:  Positive for weakness and numbness.  All other systems reviewed and are negative.     Objective:   Physical Exam  Awake, alert, appropriate sitting on table, NAD MS: RLE 5/5 LLE- HF 4/5; KE/KF 5-/5; DF and PF 5-/5  Swelling of L foot and ankle- 2+ to distal calf  Incision/scar  of Lateral aspect of L ankle-  Missing medial  malleolus on L ankle- very TTP  Good ROM of L ankle.   Neuro: Decreased to light touch over L L5 dermatome -lateral aspect of L ankle around  scar.  Gait- mildly antalgic on L side- favoring L ankle, but good DF and PF for push off.       Assessment & Plan:   Pt is a 51 yr old female with hx of back surgery in 2011; s/p laminectomy- s/p B/L hip replacements- anterior; and provoked PE/LLE DVT- s/p L ankle surgery- also has chronic migraines; also has glaucoma;  Here for evaluation of chronic pain.    Has bipolar disorder, but not on any meds. Fewer mood swings and less irritable. On Effexor.   2.   Will try and move to Cymbalta/Duloxetine.   2.  Duloxetine /Cymbalta 30 mg daily/ nightly x 1 week  Then 60 mg  daily/nightly- for nerve pain  1% of patients can have nausea with Duloxetine- call me if needs an anti-nausea medicine. Can also cause mild dry mouth/dry eyes and mild constipation.   3. Will write for Zofran 4 g 3x/day as needed for nausea as needed  4. Voltaren gel- over the counter- need to use 3-4x/day- on L ankle-   5. Lidocaine ointment- 50 grams - apply to L ankle 4x/day- can apply WITH voltaren gel to help L ankle pain.    6. Wait on controlled Pain meds at this time% might in f/u-   7.  Call me in 4-6 weeks for UDS/possible contract- and see how is working. Make sure if any mania, stop Cymbalta in the next 6 weeks. Has tolerated Effexor so far.    8.  If need be, can switch to gabapentin or Lyrica.   9. F/U in 3 months, but call at 4-6 weeks.   I spent a total of 46   minutes on total care today- >50% coordination of care- due to education and discussion about Bipolar vs Cymbalta/SNRI and education on requirements for pain mgmt issues

## 2021-10-13 NOTE — Patient Instructions (Signed)
Pt is a 51 yr old female with hx of back surgery in 2011; s/p laminectomy- s/p B/L hip replacements- anterior; and provoked PE/LLE DVT- s/p L ankle surgery- also has chronic migraines; also has glaucoma;  Here for evaluation of chronic pain.    Has bipolar disorder, but not on any meds. Fewer mood swings and less irritable. On Effexor.   2.   Will try and move to Cymbalta/Duloxetine.   2.  Duloxetine /Cymbalta 30 mg daily/ nightly x 1 week  Then 60 mg  daily/nightly- for nerve pain  1% of patients can have nausea with Duloxetine- call me if needs an anti-nausea medicine. Can also cause mild dry mouth/dry eyes and mild constipation.   3. Will write for Zofran 4 g 3x/day as needed for nausea as needed  4. Voltaren gel- over the counter- need to use 3-4x/day- on L ankle-   5. Lidocaine ointment- 50 grams - apply to L ankle 4x/day- can apply WITH voltaren gel to help L ankle pain.    6. Wait on controlled Pain meds at this time% might in f/u-   7.  Call me in 4-6 weeks for UDS/possible contract- and see how is working. Make sure if any mania, stop Cymbalta in the next 6 weeks. Has tolerated Effexor so far.    8.  If need be, can switch to gabapentin or Lyrica.   9. F/U in 3 months, but call at 4-6 weeks.

## 2021-10-15 ENCOUNTER — Other Ambulatory Visit: Payer: Medicare Other

## 2021-10-22 ENCOUNTER — Encounter: Payer: Self-pay | Admitting: Psychiatry

## 2021-10-22 ENCOUNTER — Encounter: Payer: Self-pay | Admitting: Gastroenterology

## 2021-10-27 ENCOUNTER — Other Ambulatory Visit: Payer: Self-pay | Admitting: Psychiatry

## 2021-10-27 ENCOUNTER — Ambulatory Visit: Payer: Medicare Other | Admitting: Physical Therapy

## 2021-10-27 MED ORDER — DIAZEPAM 5 MG PO TABS: ORAL_TABLET | ORAL | 0 refills | Status: DC

## 2021-10-27 NOTE — Telephone Encounter (Signed)
Rx for valium sent to her pharmacy, thanks

## 2021-10-30 ENCOUNTER — Other Ambulatory Visit: Payer: Self-pay

## 2021-10-30 ENCOUNTER — Ambulatory Visit
Admission: RE | Admit: 2021-10-30 | Discharge: 2021-10-30 | Disposition: A | Discharge status: Home / Self Care | Payer: Medicare Other | Source: Ambulatory Visit | Attending: Psychiatry | Admitting: Psychiatry

## 2021-10-30 DIAGNOSIS — R519 Headache, unspecified: Secondary | ICD-10-CM | POA: Diagnosis not present

## 2021-10-30 MED ORDER — GADOBENATE DIMEGLUMINE 529 MG/ML IV SOLN
20.0000 mL | Freq: Once | INTRAVENOUS | Status: AC | PRN
  Administered 2021-10-30: 20 mL via INTRAVENOUS

## 2021-11-11 ENCOUNTER — Encounter (HOSPITAL_COMMUNITY): Payer: Self-pay | Admitting: Emergency Medicine

## 2021-11-11 ENCOUNTER — Other Ambulatory Visit: Payer: Self-pay

## 2021-11-11 ENCOUNTER — Ambulatory Visit (HOSPITAL_COMMUNITY)
Admission: EM | Admit: 2021-11-11 | Discharge: 2021-11-11 | Disposition: A | Discharge status: Home / Self Care | Payer: Medicare Other | Attending: Family Medicine | Admitting: Family Medicine

## 2021-11-11 DIAGNOSIS — J020 Streptococcal pharyngitis: Secondary | ICD-10-CM

## 2021-11-11 LAB — POCT RAPID STREP A, ED / UC: Streptococcus, Group A Screen (Direct): POSITIVE — AB

## 2021-11-11 MED ORDER — KETOROLAC TROMETHAMINE 60 MG/2ML IM SOLN
INTRAMUSCULAR | Status: AC
  Filled 2021-11-11: qty 2

## 2021-11-11 MED ORDER — AMOXICILLIN 875 MG PO TABS: 875.0000 mg | ORAL_TABLET | Freq: Two times a day (BID) | ORAL | 0 refills | Status: AC

## 2021-11-11 MED ORDER — IBUPROFEN 800 MG PO TABS: 800.0000 mg | ORAL_TABLET | Freq: Three times a day (TID) | ORAL | 0 refills | Status: DC | PRN

## 2021-11-11 MED ORDER — KETOROLAC TROMETHAMINE 30 MG/ML IJ SOLN
30.0000 mg | Freq: Once | INTRAMUSCULAR | Status: AC
  Administered 2021-11-11: 30 mg via INTRAMUSCULAR

## 2021-11-11 NOTE — ED Provider Notes (Signed)
?[facility] ? ? ? ?CSN: [id number] ?Arrival date & time: 11/11/21  1647 ? ? ?  ? ?History   ?Chief Complaint ?Chief Complaint  ?Patient presents with  ? Sore Throat  ? Nasal Congestion  ? ? ?HPI ?Patricia Davila is a 51 y.o. female.  ? ? ?Sore Throat ? ?Here for a history of sore throat and headache that began the evening of March 26.  She has had some mild nasal congestion and just minimal cough.  No vomiting or diarrhea.  There is subjective fever yesterday. ? ?Past Medical History:  ?Diagnosis Date  ? Anemia   ? low iron during pregnancy  ? Anxiety   ? Arthritis   ? Asthma   ? as a child  ? Bipolar disorder ([place])   ? Depression   ? GERD (gastroesophageal reflux disease)   ? Headache   ? Hypertension   ? Left trimalleolar fracture   ? Pneumonia   ? as a child  ? PONV (postoperative nausea and vomiting)   ? Restless legs   ? ? ?Patient Active Problem List  ? Diagnosis Date Noted  ? Dyslipidemia 07/02/2021  ? Chronic pain syndrome 07/02/2021  ? History of blood clots 07/02/2021  ? Erosive osteoarthritis of multiple sites 07/02/2021  ? Displaced trimalleolar fracture of left lower leg, subsequent encounter for closed fracture with delayed healing 03/17/2019  ? Primary osteoarthritis of right hip 04/08/2017  ? History of hip replacement 03/25/2017  ? Primary osteoarthritis of left hip 12/22/2016  ? Major depressive disorder 10/24/2012  ? Headache(784.0) 05/13/2010  ? Microalbuminuria 04/16/2009  ? Left hip pain 11/22/2008  ? Bipolar 1 disorder, depressed ([place]) 05/17/2007  ? Lumbosacral degenerative disk disease 03/23/2005  ? Hypertension 08/18/2003  ? ? ?Past Surgical History:  ?Procedure Laterality Date  ? BILATERAL ANTERIOR TOTAL HIP ARTHROPLASTY Bilateral 03/25/2017  ? Procedure: BILATERAL ANTERIOR TOTAL HIP ARTHROPLASTY;  Surgeon: Leandrew Koyanagi, MD;  Location: [place];  Service: Orthopedics;  Laterality: Bilateral;  ? DENTAL SURGERY    ? all teeth extracted  ? INTRAVASCULAR ULTRASOUND/IVUS N/A 06/09/2019  ?  Procedure: INTRAVASCULAR ULTRASOUND/IVUS;  Surgeon: Elam Dutch, MD;  Location: [place] [facility];  Service: Cardiovascular;  Laterality: N/A;  ? LAMINECTOMY AND MICRODISCECTOMY LUMBAR SPINE   06/27/2010  ? ORIF ANKLE FRACTURE Left 03/17/2019  ? Procedure: OPEN REDUCTION INTERNAL FIXATION (ORIF) LEFT ANKLE FRACTURE;  Surgeon: Leandrew Koyanagi, MD;  Location: [facility];  Service: Orthopedics;  Laterality: Left;  ? PERIPHERAL VASCULAR INTERVENTION Left 06/09/2019  ? Procedure: PERIPHERAL VASCULAR INTERVENTION;  Surgeon: Elam Dutch, MD;  Location: [place];  Service: Cardiovascular;  Laterality: Left;  Common Iliac vein  ? PERIPHERAL VASCULAR THROMBECTOMY Left 06/09/2019  ? Procedure: PERIPHERAL VASCULAR THROMBECTOMY;  Surgeon: Elam Dutch, MD;  Location: [facility] [facility];  Service: Cardiovascular;  Laterality: Left;  ? TUBAL LIGATION Bilateral   ? ? ?OB History   ? ? Gravida  ?3  ? Para  ?3  ? Term  ?3  ? Preterm  ?   ? AB  ?   ? Living  ?3  ?  ? ? SAB  ?   ? IAB  ?   ? Ectopic  ?   ? Multiple  ?   ? Live Births  ?3  ?   ?  ?  ? ? ? ?Home Medications   ? ?Prior to Admission medications   ?Medication Sig Start Date  End Date Taking? Authorizing Provider  ?amoxicillin (AMOXIL) 875 MG tablet Take 1 tablet (875 mg total) by mouth 2 (two) times daily for 10 days. 11/11/21 11/21/21 Yes Kasidee Voisin, Gwenlyn Perking, MD  ?ibuprofen (ADVIL) 800 MG tablet Take 1 tablet (800 mg total) by mouth every 8 (eight) hours as needed (pain). 11/11/21  Yes Barrett Henle, MD  ?amLODipine (NORVASC) 10 MG tablet Take 1 tablet (10 mg total) by mouth daily. 07/02/21   Horald Pollen, MD  ?aspirin EC 81 MG tablet Take 81 mg by mouth daily. Swallow whole.    [provider]  ?atorvastatin (LIPITOR) 10 MG tablet Take 1 tablet (10 mg total) by mouth daily. 07/02/21   Horald Pollen, MD  ?Cholecalciferol (VITAMIN D3) 50 MCG (2000 UT) TABS TAKE 1 TABLET BY MOUTH EVERY DAY 08/14/19   Kerin Perna, NP  ?cyclobenzaprine (FLEXERIL) 5 MG tablet Take 1 tablet (5 mg total) by mouth 3 (three) times daily as needed for muscle spasms. TAKE 1 TO 2 TABLETS(5 TO 10 MG) BY MOUTH THREE TIMES DAILY AS NEEDED FOR MUSCLE SPASMS 08/25/21   Horald Pollen, MD  ?diazepam (VALIUM) 5 MG tablet Take 1-2 tablets 45 minutes before MRI 10/27/21   Genia Harold, MD  ?DULoxetine (CYMBALTA) 30 MG capsule Take 1 capsule (30 mg total) by mouth daily. X 1 week, then 60 mg daily- for nerve and back pain 10/13/21   Lovorn, Jinny Blossom, MD  ?hydrochlorothiazide (HYDRODIURIL) 25 MG tablet Take 1 tablet daily 07/02/21   Horald Pollen, MD  ?lidocaine (XYLOCAINE) 5 % ointment Apply 1 application topically 4 (four) times daily. Apply with voltaren gel 4x/day to L ankle. Dime sized amount- for burning/tingling. 10/13/21   Lovorn, Jinny Blossom, MD  ?losartan (COZAAR) 50 MG tablet Take 1 tablet (50 mg total) by mouth daily. 05/07/21   Kerin Perna, NP  ?Multiple Vitamin (MULTIVITAMIN) tablet Take 1 tablet by mouth daily.    [provider]  ?omeprazole (PRILOSEC) 40 MG capsule Take 1 capsule (40 mg total) by mouth daily. 07/02/21   Horald Pollen, MD  ?ondansetron (ZOFRAN) 4 MG tablet Take 1 tablet (4 mg total) by mouth every 8 (eight) hours as needed for nausea or vomiting. 10/13/21   Lovorn, Jinny Blossom, MD  ?rizatriptan (MAXALT) 10 MG tablet Take 1 tablet (10 mg total) by mouth as needed for migraine. May repeat in 2 hours if needed 09/23/21   Genia Harold, MD  ?venlafaxine XR (EFFEXOR XR) 37.5 MG 24 hr capsule Take 1 capsule (37.5 mg total) by mouth daily with breakfast. 09/23/21   Genia Harold, MD  ?Vitamin D, Ergocalciferol, (DRISDOL) 1.25 MG (50000 UNIT) CAPS capsule Take 1 capsule (50,000 Units total) by mouth every 7 (seven) days. 05/22/21   Gildardo Pounds, NP  ? ? ?Family History ?Family History  ?Problem Relation Age of Onset  ? Stomach cancer Mother   ? HIV Mother   ? Cancer Mother   ? Colon cancer Sister 1  ?  Breast cancer Neg Hx   ? Rectal cancer Neg Hx   ? ? ?Social History ?Social History  ? ?Tobacco Use  ? Smoking status: Former  ?  Packs/day: 0.25  ?  Types: Cigarettes  ? Smokeless tobacco: Never  ?Vaping Use  ? Vaping Use: Never used  ?Substance Use Topics  ? Alcohol use: No  ? Drug use: No  ? ? ? ?Allergies   ?Ativan [lorazepam], Anesthesia s-i-40 [propofol], Ditropan [oxybutynin], and Tylenol with codeine #  3 [acetaminophen-codeine] ? ? ?Review of Systems ?Review of Systems ? ? ?Physical Exam ?Triage Vital Signs ?ED Triage Vitals  ?Enc Vitals Group  ?   BP 11/11/21 1846 102/75  ?   Pulse Rate 11/11/21 1846 (!) 104  ?   Resp 11/11/21 1846 (!) 22  ?   Temp 11/11/21 1846 99.6 ?F (37.6 ?C)  ?   Temp Source 11/11/21 1846 Oral  ?   SpO2 11/11/21 1846 100 %  ?   Weight --   ?   Height --   ?   Head Circumference --   ?   Peak Flow --   ?   Pain Score 11/11/21 1845 10  ?   Pain Loc --   ?   Pain Edu? --   ?   Excl. in [place]? --   ? ?No data found. ? ?Updated Vital Signs ?BP 102/75 (BP Location: Left Arm)   Pulse (!) 104   Temp 99.6 ?F (37.6 ?C) (Oral)   Resp (!) 22   SpO2 100%  ? ?Visual Acuity ?Right Eye Distance:   ?Left Eye Distance:   ?Bilateral Distance:   ? ?Right Eye Near:   ?Left Eye Near:    ?Bilateral Near:    ? ?Physical Exam ?Vitals and nursing note reviewed.  ?Constitutional:   ?   Appearance: She is not ill-appearing, toxic-appearing or diaphoretic.  ?   Comments: Appears to be having chills in the room  ?HENT:  ?   Right Ear: Tympanic membrane normal.  ?   Left Ear: Tympanic membrane normal.  ?   Nose: Nose normal.  ?   Mouth/Throat:  ?   Mouth: Mucous membranes are moist.  ?   Comments: There is erythema of the posterior oropharynx and clear mucus draining ?Eyes:  ?   Extraocular Movements: Extraocular movements intact.  ?   Conjunctiva/sclera: Conjunctivae normal.  ?   Pupils: Pupils are equal, round, and reactive to light.  ?Cardiovascular:  ?   Rate and Rhythm: Normal rate and regular rhythm.  ?    Heart sounds: No murmur heard. ?Pulmonary:  ?   Breath sounds: Normal breath sounds. No stridor. No wheezing, rhonchi or rales.  ?Musculoskeletal:  ?   Cervical back: Neck supple.  ?Lymphadenopathy:  ?   [place]

## 2021-11-11 NOTE — ED Triage Notes (Signed)
Pt c/o sore throat, congestion and headache since yesterday.  ?

## 2021-11-11 NOTE — Discharge Instructions (Addendum)
Rapid strep test is positive ? ?Take amoxicillin 875 mg--1 tab twice daily for 10 days. ? ?Take ibuprofen 800 mg--1 tablet every 8 hours as needed for pain ? ?You have been given a shot of Toradol 30 mg  ? ?Change her toothbrush out after about 2 to 3 days of antibiotics ?

## 2021-12-16 ENCOUNTER — Other Ambulatory Visit (INDEPENDENT_AMBULATORY_CARE_PROVIDER_SITE_OTHER): Payer: Self-pay | Admitting: Primary Care

## 2021-12-16 DIAGNOSIS — E7800 Pure hypercholesterolemia, unspecified: Secondary | ICD-10-CM

## 2021-12-25 ENCOUNTER — Encounter (INDEPENDENT_AMBULATORY_CARE_PROVIDER_SITE_OTHER): Payer: Self-pay | Admitting: Primary Care

## 2021-12-31 ENCOUNTER — Ambulatory Visit: Payer: Medicare Other | Admitting: Psychiatry

## 2021-12-31 ENCOUNTER — Encounter: Payer: Self-pay | Admitting: Psychiatry

## 2021-12-31 ENCOUNTER — Ambulatory Visit: Payer: Medicare Other | Admitting: Emergency Medicine

## 2021-12-31 NOTE — Progress Notes (Deleted)
   CC:  headaches  Follow-up Visit  Last visit: 09/23/21  Brief HPI: 51 year old female with a history of hx provoked LLE DVT and PE (2020, 2/2 ankle surgery), HTN, HLD, glaucoma who follows in clinic for chronic migraines.  At her last visit MRI brain was ordered. She was started on Effexor for migraine prevention and Maxalt for rescue. Referral to neck PT was placed. Interval History: *** MRI brain 10/30/21 showed scattered nonspecific white matter changes and was otherwise unremarkable.  Headache days per month: *** Headache free days per month: *** Headache severity: ***  Current Headache Regimen: Preventative: *** Abortive: ***  # of doses of abortive medications per month: ***  Prior Therapies                                  Imitrex 100 mg  Maxalt 10 mg PRN BC powder Topamax 25 mg BID  Effexor 37.5 mg daily Losartan 50 mg Flexeril 5 mg PRN  Physical Exam:   Vital Signs: There were no vitals taken for this visit. GENERAL:  well appearing, in no acute distress, alert  SKIN:  Color, texture, turgor normal. No rashes or lesions HEAD:  Normocephalic/atraumatic. RESP: normal respiratory effort MSK:  No gross joint deformities.   NEUROLOGICAL: Mental Status: Alert, oriented to person, place and time, Follows commands, and Speech fluent and appropriate. Cranial Nerves: PERRL, face symmetric, no dysarthria, hearing grossly intact Motor: moves all extremities equally Gait: normal-based.  IMPRESSION: ***  PLAN: ***   Follow-up: ***  I spent a total of *** minutes on the date of the service. Headache education was done. Discussed lifestyle modification including increased oral hydration, decreased caffeine, exercise and stress management. Discussed treatment options including preventive and acute medications, natural supplements, and infusion therapy. Discussed medication overuse headache and to limit use of acute treatments to no more than 2 days/week or 10  days/month. Discussed medication side effects, adverse reactions and drug interactions. Written educational materials and patient instructions outlining all of the above were given.  Genia Harold, MD

## 2022-01-22 ENCOUNTER — Ambulatory Visit: Payer: Medicare Other | Admitting: Emergency Medicine

## 2022-01-23 ENCOUNTER — Ambulatory Visit: Payer: Medicare Other | Admitting: Physical Medicine and Rehabilitation

## 2022-02-12 ENCOUNTER — Encounter: Payer: Self-pay | Admitting: Emergency Medicine

## 2022-02-12 ENCOUNTER — Ambulatory Visit (INDEPENDENT_AMBULATORY_CARE_PROVIDER_SITE_OTHER): Payer: Medicare Other | Admitting: Emergency Medicine

## 2022-02-12 VITALS — BP 110/72 | HR 72 | Temp 98.1°F | Ht 61.0 in | Wt 205.2 lb

## 2022-02-12 DIAGNOSIS — M154 Erosive (osteo)arthritis: Secondary | ICD-10-CM

## 2022-02-12 DIAGNOSIS — Z23 Encounter for immunization: Secondary | ICD-10-CM

## 2022-02-12 DIAGNOSIS — G43909 Migraine, unspecified, not intractable, without status migrainosus: Secondary | ICD-10-CM

## 2022-02-12 DIAGNOSIS — G894 Chronic pain syndrome: Secondary | ICD-10-CM | POA: Diagnosis not present

## 2022-02-12 DIAGNOSIS — F319 Bipolar disorder, unspecified: Secondary | ICD-10-CM

## 2022-02-12 DIAGNOSIS — I10 Essential (primary) hypertension: Secondary | ICD-10-CM

## 2022-02-12 DIAGNOSIS — E785 Hyperlipidemia, unspecified: Secondary | ICD-10-CM

## 2022-02-12 LAB — CBC WITH DIFFERENTIAL/PLATELET
Basophils Absolute: 0 10*3/uL (ref 0.0–0.1)
Basophils Relative: 0.5 % (ref 0.0–3.0)
Eosinophils Absolute: 0.1 10*3/uL (ref 0.0–0.7)
Eosinophils Relative: 1 % (ref 0.0–5.0)
HCT: 36.2 % (ref 36.0–46.0)
Hemoglobin: 12 g/dL (ref 12.0–15.0)
Lymphocytes Relative: 30.8 % (ref 12.0–46.0)
Lymphs Abs: 2.3 10*3/uL (ref 0.7–4.0)
MCHC: 33.2 g/dL (ref 30.0–36.0)
MCV: 86.6 fl (ref 78.0–100.0)
Monocytes Absolute: 0.4 10*3/uL (ref 0.1–1.0)
Monocytes Relative: 5.9 % (ref 3.0–12.0)
Neutro Abs: 4.6 10*3/uL (ref 1.4–7.7)
Neutrophils Relative %: 61.8 % (ref 43.0–77.0)
Platelets: 286 10*3/uL (ref 150.0–400.0)
RBC: 4.18 Mil/uL (ref 3.87–5.11)
RDW: 16.3 % — ABNORMAL HIGH (ref 11.5–15.5)
WBC: 7.4 10*3/uL (ref 4.0–10.5)

## 2022-02-12 LAB — LIPID PANEL
Cholesterol: 141 mg/dL (ref 0–200)
HDL: 40.6 mg/dL (ref >39.00)
LDL Cholesterol: 89 mg/dL (ref 0–99)
NonHDL: 100.08
Total CHOL/HDL Ratio: 3
Triglycerides: 57 mg/dL (ref 0.0–149.0)
VLDL: 11.4 mg/dL (ref 0.0–40.0)

## 2022-02-12 LAB — COMPREHENSIVE METABOLIC PANEL
ALT: 13 U/L (ref 0–35)
AST: 13 U/L (ref 0–37)
Albumin: 4.1 g/dL (ref 3.5–5.2)
Alkaline Phosphatase: 92 U/L (ref 39–117)
BUN: 11 mg/dL (ref 6–23)
CO2: 28 mEq/L (ref 19–32)
Calcium: 8.9 mg/dL (ref 8.4–10.5)
Chloride: 105 mEq/L (ref 96–112)
Creatinine, Ser: 0.77 mg/dL (ref 0.40–1.20)
GFR: 89.56 mL/min (ref >60.00)
Glucose, Bld: 69 mg/dL — ABNORMAL LOW (ref 70–99)
Potassium: 3.4 mEq/L — ABNORMAL LOW (ref 3.5–5.1)
Sodium: 138 mEq/L (ref 135–145)
Total Bilirubin: 0.4 mg/dL (ref 0.2–1.2)
Total Protein: 7 g/dL (ref 6.0–8.3)

## 2022-02-12 LAB — HEMOGLOBIN A1C: Hgb A1c MFr Bld: 5.6 % (ref 4.6–6.5)

## 2022-02-12 NOTE — Progress Notes (Signed)
Patricia Davila 51 y.o.   Chief Complaint  Patient presents with   Follow-up    6 month f/u appt, muscle cramps in her legs     HISTORY OF PRESENT ILLNESS: This is a 51 y.o. female here for 17-monthfollow-up. Overall doing very well. History of hypertension, dyslipidemia, chronic migraines, chronic pain secondary to osteoarthritis of multiple sites.  Stable. Has no complaints or medical concerns today. BP Readings from Last 3 Encounters:  02/12/22 110/72  11/11/21 102/75  10/13/21 122/83  History of chronic migraines.  Most recent neurologist office visit notes assessment and plan as follows: IMPRESSION: 51year old female with a history of provoked LLE DVT and PE (2020, 2/2 ankle surgery), HTN, HLD, glaucoma who presents for follow up of migraines. Headaches have continued to worsen. Will order MRI brain to evaluation for structural causes of worsening headache in 51year old. She will pick up the Effexor today. Will switch Imitrex to Maxalt and see if this is more effective for rescue. Referral to neck PT placed for cervicalgia.   PLAN: -MRI brain with contrast -Preventive: Start Effexor 37.5 mg daily -Rescue: Stop Imitrex. Start Maxalt 10 mg PRN -Referral to neck PT -next steps: consider CGRP, Botox, consider gepant for rescue  JGenia Harold MD 09/23/21 1:50 PM   HPI   Prior to Admission medications   Medication Sig Start Date End Date Taking? Authorizing Provider  amLODipine (NORVASC) 10 MG tablet Take 1 tablet (10 mg total) by mouth daily. 07/02/21  Yes SHorald Pollen MD  aspirin EC 81 MG tablet Take 81 mg by mouth daily. Swallow whole.   Yes [provider]  Cholecalciferol (VITAMIN D3) 50 MCG (2000 UT) TABS TAKE 1 TABLET BY MOUTH EVERY DAY 08/14/19  Yes EKerin Perna NP  cyclobenzaprine (FLEXERIL) 5 MG tablet Take 1 tablet (5 mg total) by mouth 3 (three) times daily as needed for muscle spasms. TAKE 1 TO 2 TABLETS(5 TO 10 MG) BY MOUTH THREE TIMES  DAILY AS NEEDED FOR MUSCLE SPASMS 08/25/21  Yes Gillermo Poch, MInes Bloomer MD  DULoxetine (CYMBALTA) 30 MG capsule Take 1 capsule (30 mg total) by mouth daily. X 1 week, then 60 mg daily- for nerve and back pain 10/13/21  Yes Lovorn, MJinny Blossom MD  ibuprofen (ADVIL) 800 MG tablet Take 1 tablet (800 mg total) by mouth every 8 (eight) hours as needed (pain). 11/11/21  Yes Banister, PGwenlyn Perking MD  lidocaine (XYLOCAINE) 5 % ointment Apply 1 application topically 4 (four) times daily. Apply with voltaren gel 4x/day to L ankle. Dime sized amount- for burning/tingling. 10/13/21  Yes Lovorn, MJinny Blossom MD  losartan (COZAAR) 50 MG tablet Take 1 tablet (50 mg total) by mouth daily. 05/07/21  Yes EKerin Perna NP  Multiple Vitamin (MULTIVITAMIN) tablet Take 1 tablet by mouth daily.   Yes [provider]  omeprazole (PRILOSEC) 40 MG capsule Take 1 capsule (40 mg total) by mouth daily. 07/02/21  Yes Altheia Shafran, MInes Bloomer MD  rizatriptan (MAXALT) 10 MG tablet Take 1 tablet (10 mg total) by mouth as needed for migraine. May repeat in 2 hours if needed 09/23/21  Yes CGenia Harold MD  venlafaxine XR (EFFEXOR XR) 37.5 MG 24 hr capsule Take 1 capsule (37.5 mg total) by mouth daily with breakfast. 09/23/21  Yes CGenia Harold MD  Vitamin D, Ergocalciferol, (DRISDOL) 1.25 MG (50000 UNIT) CAPS capsule Take 1 capsule (50,000 Units total) by mouth every 7 (seven) days. 05/22/21  Yes FGildardo Pounds NP  atorvastatin (LIPITOR) 10 MG tablet TAKE 1 TABLET(10 MG) BY MOUTH DAILY Patient not taking: Reported on 02/12/2022 12/16/21   Gildardo Pounds, NP  diazepam (VALIUM) 5 MG tablet Take 1-2 tablets 45 minutes before MRI Patient not taking: Reported on 02/12/2022 10/27/21   Genia Harold, MD  hydrochlorothiazide (HYDRODIURIL) 25 MG tablet Take 1 tablet daily Patient not taking: Reported on 02/12/2022 07/02/21   Horald Pollen, MD  ondansetron (ZOFRAN) 4 MG tablet Take 1 tablet (4 mg total) by mouth every 8 (eight) hours as  needed for nausea or vomiting. Patient not taking: Reported on 02/12/2022 10/13/21   Courtney Heys, MD    Allergies  Allergen Reactions   Ativan [Lorazepam] Anxiety    HALLUCINATIONS   Anesthesia S-I-40 [Propofol]     hallucinations   Ditropan [Oxybutynin]     Anxiety   Tylenol With Codeine #3 [Acetaminophen-Codeine] Hives and Itching    Patient Active Problem List   Diagnosis Date Noted   Dyslipidemia 07/02/2021   Chronic pain syndrome 07/02/2021   History of blood clots 07/02/2021   Erosive osteoarthritis of multiple sites 07/02/2021   Displaced trimalleolar fracture of left lower leg, subsequent encounter for closed fracture with delayed healing 03/17/2019   Primary osteoarthritis of right hip 04/08/2017   History of hip replacement 03/25/2017   Primary osteoarthritis of left hip 12/22/2016   Major depressive disorder 10/24/2012   Headache(784.0) 05/13/2010   Microalbuminuria 04/16/2009   Left hip pain 11/22/2008   Bipolar 1 disorder, depressed ([place]) 05/17/2007   Lumbosacral degenerative disk disease 03/23/2005   Hypertension 08/18/2003    Past Medical History:  Diagnosis Date   Anemia    low iron during pregnancy   Anxiety    Arthritis    Asthma    as a child   Bipolar disorder ([place])    Depression    GERD (gastroesophageal reflux disease)    Headache    Hypertension    Left trimalleolar fracture    Pneumonia    as a child   PONV (postoperative nausea and vomiting)    Restless legs     Past Surgical History:  Procedure Laterality Date   BILATERAL ANTERIOR TOTAL HIP ARTHROPLASTY Bilateral 03/25/2017   Procedure: BILATERAL ANTERIOR TOTAL HIP ARTHROPLASTY;  Surgeon: Leandrew Koyanagi, MD;  Location: [place];  Service: Orthopedics;  Laterality: Bilateral;   DENTAL SURGERY     all teeth extracted   INTRAVASCULAR ULTRASOUND/IVUS N/A 06/09/2019   Procedure: INTRAVASCULAR ULTRASOUND/IVUS;  Surgeon: Elam Dutch, MD;  Location: [facility];  Service:  Cardiovascular;  Laterality: N/A;   LAMINECTOMY AND MICRODISCECTOMY LUMBAR SPINE   06/27/2010   ORIF ANKLE FRACTURE Left 03/17/2019   Procedure: OPEN REDUCTION INTERNAL FIXATION (ORIF) LEFT ANKLE FRACTURE;  Surgeon: Leandrew Koyanagi, MD;  Location: [place];  Service: Orthopedics;  Laterality: Left;   PERIPHERAL VASCULAR INTERVENTION Left 06/09/2019   Procedure: PERIPHERAL VASCULAR INTERVENTION;  Surgeon: Elam Dutch, MD;  Location: [place];  Service: Cardiovascular;  Laterality: Left;  Common Iliac vein   PERIPHERAL VASCULAR THROMBECTOMY Left 06/09/2019   Procedure: PERIPHERAL VASCULAR THROMBECTOMY;  Surgeon: Elam Dutch, MD;  Location: [facility];  Service: Cardiovascular;  Laterality: Left;   TUBAL LIGATION Bilateral     Social History   Socioeconomic History   Marital status: Married    Spouse name: Not on file   Number of children: Not on file   Years of education: Not on  file   Highest education level: Not on file  Occupational History   Not on file  Tobacco Use   Smoking status: Former    Packs/day: 0.25    Types: Cigarettes   Smokeless tobacco: Never  Vaping Use   Vaping Use: Never used  Substance and Sexual Activity   Alcohol use: No   Drug use: No   Sexual activity: Yes    Birth control/protection: Surgical    Comment: BTL  Other Topics Concern   Not on file  Social History Narrative   Not on file   Social Determinants of Health   Financial Resource Strain: Not on file  Food Insecurity: Not on file  Transportation Needs: Not on file  Physical Activity: Not on file  Stress: Not on file  Social Connections: Not on file  Intimate Partner Violence: Not on file    Family History  Problem Relation Age of Onset   Stomach cancer Mother    HIV Mother    Cancer Mother    Colon cancer Sister 83   Breast cancer Neg Hx    Rectal cancer Neg Hx      Review of Systems  Constitutional: Negative.  Negative for chills  and fever.  HENT: Negative.  Negative for congestion and sore throat.   Respiratory: Negative.  Negative for cough and shortness of breath.   Cardiovascular: Negative.  Negative for chest pain and palpitations.  Gastrointestinal:  Negative for abdominal pain, diarrhea, nausea and vomiting.  Genitourinary: Negative.   Musculoskeletal:  Positive for joint pain.  Skin: Negative.  Negative for rash.  Neurological:  Negative for dizziness and headaches.  All other systems reviewed and are negative.   Today's Vitals   02/12/22 1303  BP: 110/72  Pulse: 72  Temp: 98.1 F (36.7 C)  TempSrc: Oral  SpO2: 96%  Weight: 205 lb 4 oz (93.1 kg)  Height: '5\' 1"'$  (1.549 m)   Body mass index is 38.78 kg/m.  Physical Exam Vitals reviewed.  Constitutional:      Appearance: Normal appearance.  HENT:     Head: Normocephalic.     Mouth/Throat:     Mouth: Mucous membranes are moist.     Pharynx: Oropharynx is clear.  Eyes:     Extraocular Movements: Extraocular movements intact.     Conjunctiva/sclera: Conjunctivae normal.     Pupils: Pupils are equal, round, and reactive to light.  Cardiovascular:     Rate and Rhythm: Normal rate and regular rhythm.     Pulses: Normal pulses.     Heart sounds: Normal heart sounds.  Pulmonary:     Effort: Pulmonary effort is normal.     Breath sounds: Normal breath sounds.  Abdominal:     Palpations: Abdomen is soft.     Tenderness: There is no abdominal tenderness.  Musculoskeletal:     Cervical back: No tenderness.     Right lower leg: No edema.     Left lower leg: No edema.  Lymphadenopathy:     Cervical: No cervical adenopathy.  Skin:    General: Skin is warm and dry.     Capillary Refill: Capillary refill takes less than 2 seconds.  Neurological:     General: No focal deficit present.     Mental Status: She is alert and oriented to person, place, and time.  Psychiatric:        Mood and Affect: Mood normal.        Behavior: Behavior normal.  ASSESSMENT & PLAN: A total of 46 minutes was spent with the patient and counseling/coordination of care regarding preparing for this visit, review of most recent office visit notes, review of multiple chronic medical problems and their management, review of all medications, review of most recent office visit note with neurologist, education on nutrition, cardiovascular risks associated with hypertension and dyslipidemia, prognosis, documentation, and need for follow-up.  Problem List Items Addressed This Visit       Cardiovascular and Mediastinum   Hypertension - Primary    Well-controlled hypertension. BP Readings from Last 3 Encounters:  02/12/22 110/72  11/11/21 102/75  10/13/21 122/83  Continue amlodipine 10 mg daily.       Relevant Orders   CBC with Differential/Platelet   Comprehensive metabolic panel   Nonintractable chronic migraine    Well-controlled.  Most recent neurologist office visit notes reviewed.        Musculoskeletal and Integument   Erosive osteoarthritis of multiple sites    Well-controlled.  No complaints.        Other   Bipolar 1 disorder, depressed ([place])   Dyslipidemia    Stable.  Diet and nutrition discussed. Not taking medication at present time.  Lipid profile done today. The 10-year ASCVD risk score (Arnett DK, et al., 2019) is: 2.6%   Values used to calculate the score:     Age: 64 years     Sex: Female     Is Non-Hispanic African American: Yes     Diabetic: No     Tobacco smoker: No     Systolic Blood Pressure: 081 mmHg     Is BP treated: Yes     HDL Cholesterol: 35 mg/dL     Total Cholesterol: 140 mg/dL       Relevant Orders   CBC with Differential/Platelet   Hemoglobin A1c   Lipid panel   Chronic pain syndrome    Stable and well-controlled.      Other Visit Diagnoses     Need for vaccination       Relevant Orders   Varicella-zoster vaccine IM (Shingrix)      Patient Instructions  Health Maintenance,  Female Adopting a healthy lifestyle and getting preventive care are important in promoting health and wellness. Ask your health care provider about: The right schedule for you to have regular tests and exams. Things you can do on your own to prevent diseases and keep yourself healthy. What should I know about diet, weight, and exercise? Eat a healthy diet  Eat a diet that includes plenty of vegetables, fruits, low-fat dairy products, and lean protein. Do not eat a lot of foods that are high in solid fats, added sugars, or sodium. Maintain a healthy weight Body mass index (BMI) is used to identify weight problems. It estimates body fat based on height and weight. Your health care provider can help determine your BMI and help you achieve or maintain a healthy weight. Get regular exercise Get regular exercise. This is one of the most important things you can do for your health. Most adults should: Exercise for at least 150 minutes each week. The exercise should increase your heart rate and make you sweat (moderate-intensity exercise). Do strengthening exercises at least twice a week. This is in addition to the moderate-intensity exercise. Spend less time sitting. Even light physical activity can be beneficial. Watch cholesterol and blood lipids Have your blood tested for lipids and cholesterol at 51 years of age, then have this test every 5  years. Have your cholesterol levels checked more often if: Your lipid or cholesterol levels are high. You are older than 51 years of age. You are at high risk for heart disease. What should I know about cancer screening? Depending on your health history and family history, you may need to have cancer screening at various ages. This may include screening for: Breast cancer. Cervical cancer. Colorectal cancer. Skin cancer. Lung cancer. What should I know about heart disease, diabetes, and high blood pressure? Blood pressure and heart disease High blood  pressure causes heart disease and increases the risk of stroke. This is more likely to develop in people who have high blood pressure readings or are overweight. Have your blood pressure checked: Every 3-5 years if you are 15-68 years of age. Every year if you are 75 years old or older. Diabetes Have regular diabetes screenings. This checks your fasting blood sugar level. Have the screening done: Once every three years after age 61 if you are at a normal weight and have a low risk for diabetes. More often and at a younger age if you are overweight or have a high risk for diabetes. What should I know about preventing infection? Hepatitis B If you have a higher risk for hepatitis B, you should be screened for this virus. Talk with your health care provider to find out if you are at risk for hepatitis B infection. Hepatitis C Testing is recommended for: Everyone born from [birth year] through 1965. Anyone with known risk factors for hepatitis C. Sexually transmitted infections (STIs) Get screened for STIs, including gonorrhea and chlamydia, if: You are sexually active and are younger than 51 years of age. You are older than 51 years of age and your health care provider tells you that you are at risk for this type of infection. Your sexual activity has changed since you were last screened, and you are at increased risk for chlamydia or gonorrhea. Ask your health care provider if you are at risk. Ask your health care provider about whether you are at high risk for HIV. Your health care provider may recommend a prescription medicine to help prevent HIV infection. If you choose to take medicine to prevent HIV, you should first get tested for HIV. You should then be tested every 3 months for as long as you are taking the medicine. Pregnancy If you are about to stop having your period (premenopausal) and you may become pregnant, seek counseling before you get pregnant. Take 400 to 800 micrograms (mcg) of folic  acid every day if you become pregnant. Ask for birth control (contraception) if you want to prevent pregnancy. Osteoporosis and menopause Osteoporosis is a disease in which the bones lose minerals and strength with aging. This can result in bone fractures. If you are 39 years old or older, or if you are at risk for osteoporosis and fractures, ask your health care provider if you should: Be screened for bone loss. Take a calcium or vitamin D supplement to lower your risk of fractures. Be given hormone replacement therapy (HRT) to treat symptoms of menopause. Follow these instructions at home: Alcohol use Do not drink alcohol if: Your health care provider tells you not to drink. You are pregnant, may be pregnant, or are planning to become pregnant. If you drink alcohol: Limit how much you have to: 0-1 drink a day. Know how much alcohol is in your drink. In the [place] one drink equals one 12 oz bottle of beer (355 mL), one  5 oz glass of wine (148 mL), or one 1 oz glass of hard liquor (44 mL). Lifestyle Do not use any products that contain nicotine or tobacco. These products include cigarettes, chewing tobacco, and vaping devices, such as e-cigarettes. If you need help quitting, ask your health care provider. Do not use street drugs. Do not share needles. Ask your health care provider for help if you need support or information about quitting drugs. General instructions Schedule regular health, dental, and eye exams. Stay current with your vaccines. Tell your health care provider if: You often feel depressed. You have ever been abused or do not feel safe at home. Summary Adopting a healthy lifestyle and getting preventive care are important in promoting health and wellness. Follow your health care provider's instructions about healthy diet, exercising, and getting tested or screened for diseases. Follow your health care provider's instructions on monitoring your cholesterol and blood  pressure. This information is not intended to replace advice given to you by your health care provider. Make sure you discuss any questions you have with your health care provider. Document Revised: 12/23/2020 Document Reviewed: 12/23/2020 Elsevier Patient Education  [place], MD [facility]

## 2022-02-12 NOTE — Assessment & Plan Note (Signed)
Well-controlled.  Most recent neurologist office visit notes reviewed.

## 2022-02-12 NOTE — Assessment & Plan Note (Signed)
Well-controlled.  No complaints.

## 2022-02-12 NOTE — Assessment & Plan Note (Signed)
Stable.  Diet and nutrition discussed. Not taking medication at present time.  Lipid profile done today. The 10-year ASCVD risk score (Arnett DK, et al., 2019) is: 2.6%   Values used to calculate the score:     Age: 51 years     Sex: Female     Is Non-Hispanic African American: Yes     Diabetic: No     Tobacco smoker: No     Systolic Blood Pressure: 854 mmHg     Is BP treated: Yes     HDL Cholesterol: 35 mg/dL     Total Cholesterol: 140 mg/dL

## 2022-02-12 NOTE — Assessment & Plan Note (Signed)
Well-controlled hypertension. BP Readings from Last 3 Encounters:  02/12/22 110/72  11/11/21 102/75  10/13/21 122/83  Continue amlodipine 10 mg daily.

## 2022-02-12 NOTE — Patient Instructions (Signed)

## 2022-02-12 NOTE — Assessment & Plan Note (Signed)
Stable and well controlled. 

## 2022-02-21 ENCOUNTER — Encounter: Payer: Self-pay | Admitting: Emergency Medicine

## 2022-03-09 ENCOUNTER — Telehealth: Payer: Self-pay | Admitting: Psychiatry

## 2022-03-09 NOTE — Telephone Encounter (Signed)
Called pt to r/s 7/25 appt (MD out early). During the call, pt stated that the migraine medication she is currently on is not working for her. She states that the Effexor and Maxalt are not getting rid of her migraines and wants to know if she can be put on something else before she's able to come in and be seen. Pt was rescheduled for 9/6.

## 2022-03-10 ENCOUNTER — Telehealth: Payer: Self-pay | Admitting: *Deleted

## 2022-03-10 ENCOUNTER — Encounter: Payer: Self-pay | Admitting: *Deleted

## 2022-03-10 ENCOUNTER — Other Ambulatory Visit: Payer: Self-pay | Admitting: Psychiatry

## 2022-03-10 ENCOUNTER — Ambulatory Visit: Payer: Medicare Other | Admitting: Psychiatry

## 2022-03-10 MED ORDER — EMGALITY 120 MG/ML ~~LOC~~ SOAJ: 240.0000 mg | Freq: Once | SUBCUTANEOUS | 0 refills | Status: AC

## 2022-03-10 MED ORDER — EMGALITY 120 MG/ML ~~LOC~~ SOAJ: 120.0000 mg | SUBCUTANEOUS | 6 refills | Status: DC

## 2022-03-10 MED ORDER — ZOLMITRIPTAN 5 MG PO TABS: 5.0000 mg | ORAL_TABLET | ORAL | 6 refills | Status: DC | PRN

## 2022-03-10 NOTE — Progress Notes (Signed)
omi

## 2022-03-10 NOTE — Telephone Encounter (Signed)
EMGALITY INJ '120MG'$ /ML, use as directed, is approved through 08/16/2022 under your Medicare Part D benefit. Sent patient my chart to advise.

## 2022-03-10 NOTE — Telephone Encounter (Signed)
I sent in an rx for Emgality to her pharmacy. She can stop the effexor and try that instead for headache prevention. I also sent in a prescription for Zomig to take as needed

## 2022-03-10 NOTE — Telephone Encounter (Signed)
LVM informing patient of 2 new Rx, PA in process for Emgality. Left # for questions.

## 2022-03-10 NOTE — Telephone Encounter (Signed)
Emgality PA, Key: [id number], [id number]. Your information has been sent to [facility]

## 2022-03-11 ENCOUNTER — Other Ambulatory Visit: Payer: Self-pay | Admitting: Emergency Medicine

## 2022-03-11 ENCOUNTER — Encounter: Payer: Self-pay | Admitting: Emergency Medicine

## 2022-03-11 DIAGNOSIS — I10 Essential (primary) hypertension: Secondary | ICD-10-CM

## 2022-03-11 DIAGNOSIS — Z760 Encounter for issue of repeat prescription: Secondary | ICD-10-CM

## 2022-03-12 MED ORDER — OMEPRAZOLE 40 MG PO CPDR: 40.0000 mg | DELAYED_RELEASE_CAPSULE | Freq: Every day | ORAL | 3 refills | Status: DC

## 2022-03-18 ENCOUNTER — Ambulatory Visit (INDEPENDENT_AMBULATORY_CARE_PROVIDER_SITE_OTHER): Payer: Medicare Other | Admitting: Psychiatry

## 2022-03-18 VITALS — BP 136/82 | Ht 61.5 in | Wt 205.1 lb

## 2022-03-18 DIAGNOSIS — G43119 Migraine with aura, intractable, without status migrainosus: Secondary | ICD-10-CM

## 2022-03-18 NOTE — Patient Instructions (Signed)
Continue Emgality monthly for migraine prevention  Take Zomig as needed for migraines. Take at onset of migraine. Can repeat a dose in 2 hours if headache persists. Max dose 2 pills in 24 hours.

## 2022-03-18 NOTE — Progress Notes (Signed)
   CC:  headaches  Follow-up Visit  Last visit: 09/23/21  Brief HPI: 51 year old female with a history of hx provoked LLE DVT and PE (2020, 2/2 ankle surgery), HTN, HLD, glaucoma who follows in clinic for chronic migraines.  At her last visit she was started on Effexor for migraine prevention and Maxalt for rescue.  Interval History: Effexor was ineffective so she was started on Emgality for migraine prevention. She has not had any headaches since starting it. Maxalt was not effective so she was switched to Zomig. She has not tried this because she has not had headaches.  Headache days per month: 0 Headache free days per month: 30  Current Headache Regimen: Preventative: Emgality 120 mg monthly Abortive: Zomig 5 mg PRN  Prior Therapies                                  Rescue: Imitrex 100 mg  Maxalt 10 mg PRN - lack of efficacy Zomig 5 mg PRN BC powder  Preventive: Topamax 25 mg BID  Effexor 37.5 mg daily Losartan 50 mg Emgality 120 mg monthly Flexeril 5 mg PRN  Physical Exam:   Vital Signs: BP 136/82   Ht 5' 1.5" (1.562 m)   Wt 205 lb 2 oz (93 kg)   BMI 38.13 kg/m  GENERAL:  well appearing, in no acute distress, alert  SKIN:  Color, texture, turgor normal. No rashes or lesions HEAD:  Normocephalic/atraumatic. RESP: normal respiratory effort MSK:  No gross joint deformities.   NEUROLOGICAL: Mental Status: Alert, oriented to person, place and time, Follows commands, and Speech fluent and appropriate. Cranial Nerves: PERRL, face symmetric, no dysarthria, hearing grossly intact Motor: moves all extremities equally Gait: normal-based.  IMPRESSION: 51 year old female with a history of  with a history of hx provoked LLE DVT and PE (2020, 2/2 ankle surgery), HTN, HLD, glaucoma who presents for follow up of chronic migraines. She has not had any migraines since starting Emgality in July. Will continue this for prevention and continue Zomig for  rescue.  PLAN: -Prevention: Continue Emgality 120 mg monthly -Rescue: Continue Zomig 5 mg PRN for rescue   Follow-up: 6 months  I spent a total of 19 minutes on the date of the service. Headache education was done.  Discussed medication side effects, adverse reactions and drug interactions. Written educational materials and patient instructions outlining all of the above were given.  Genia Harold, MD 03/18/22 3:56 PM

## 2022-04-04 ENCOUNTER — Other Ambulatory Visit: Payer: Self-pay | Admitting: Emergency Medicine

## 2022-04-04 DIAGNOSIS — I10 Essential (primary) hypertension: Secondary | ICD-10-CM

## 2022-04-04 DIAGNOSIS — Z760 Encounter for issue of repeat prescription: Secondary | ICD-10-CM

## 2022-04-08 ENCOUNTER — Telehealth: Payer: Self-pay | Admitting: Emergency Medicine

## 2022-04-08 NOTE — Telephone Encounter (Signed)
LVM for pt to rtn my call to schedule AWV with [facility] call back # [phone] 

## 2022-04-22 ENCOUNTER — Ambulatory Visit: Payer: Medicare Other | Admitting: Psychiatry

## 2022-04-28 ENCOUNTER — Other Ambulatory Visit: Payer: Self-pay | Admitting: Emergency Medicine

## 2022-04-28 ENCOUNTER — Other Ambulatory Visit: Payer: Self-pay | Admitting: Psychiatry

## 2022-04-28 DIAGNOSIS — I10 Essential (primary) hypertension: Secondary | ICD-10-CM

## 2022-05-01 ENCOUNTER — Encounter: Payer: Self-pay | Admitting: Physical Medicine and Rehabilitation

## 2022-05-01 ENCOUNTER — Encounter
Payer: Medicare Other | Attending: Physical Medicine and Rehabilitation | Admitting: Physical Medicine and Rehabilitation

## 2022-05-01 ENCOUNTER — Encounter: Payer: Self-pay | Admitting: Emergency Medicine

## 2022-05-01 VITALS — BP 131/98 | HR 82 | Ht 61.5 in | Wt 204.0 lb

## 2022-05-01 DIAGNOSIS — M5137 Other intervertebral disc degeneration, lumbosacral region: Secondary | ICD-10-CM | POA: Diagnosis not present

## 2022-05-01 DIAGNOSIS — I10 Essential (primary) hypertension: Secondary | ICD-10-CM

## 2022-05-01 DIAGNOSIS — Z5181 Encounter for therapeutic drug level monitoring: Secondary | ICD-10-CM | POA: Insufficient documentation

## 2022-05-01 DIAGNOSIS — Z760 Encounter for issue of repeat prescription: Secondary | ICD-10-CM

## 2022-05-01 DIAGNOSIS — S82852G Displaced trimalleolar fracture of left lower leg, subsequent encounter for closed fracture with delayed healing: Secondary | ICD-10-CM | POA: Insufficient documentation

## 2022-05-01 DIAGNOSIS — Z79891 Long term (current) use of opiate analgesic: Secondary | ICD-10-CM | POA: Insufficient documentation

## 2022-05-01 DIAGNOSIS — G894 Chronic pain syndrome: Secondary | ICD-10-CM | POA: Diagnosis not present

## 2022-05-01 MED ORDER — PREGABALIN 100 MG PO CAPS: 100.0000 mg | ORAL_CAPSULE | Freq: Three times a day (TID) | ORAL | 5 refills | Status: DC

## 2022-05-01 NOTE — Telephone Encounter (Signed)
Patient requesting refill of her Losartan. Original rx was prescribed by another provider. Please advise on refill

## 2022-05-01 NOTE — Patient Instructions (Signed)
Pt is a 51 yr old female with hx of back surgery in 2011; s/p laminectomy- s/p B/L hip replacements- anterior; and provoked PE/LLE DVT- s/p L ankle surgery- also has chronic migraines; also has glaucoma;  Here for evaluation of chronic pain.   Duloxetine has likely been the medicine making her feel nauseated- so we will stop it. Can stop immediately without a taper  2.  Lyrica/Pregabalin 100 mg nightly x 1 week, then increase to 100 mg in Am and 200 mg nightly- for nerve pain. Will notice an improvement with nerve pain when on higher dose of medicine.   3. Will do opiate contract and UDS- urine drug screen- today- if UDS negative, will then try Tramadol 50 mg 3x/day as needed for pain.  Can send in a 7 day supply first, per federal guidelines, then 30 day supply if everything ok.   4. F/U in 3 months, but call in 1 month to let me know how things going.

## 2022-05-01 NOTE — Progress Notes (Signed)
Subjective:    Patient ID: Patricia Davila, female    DOB: [birth year], 51 y.o.   MRN: [id number]  HPI  Pt is a 51 yr old female with hx of back surgery in 2011; s/p laminectomy- s/p B/L hip replacements- anterior; and provoked PE/LLE DVT- s/p L ankle surgery- also has chronic migraines; also has glaucoma;  Here for f/u on chronic pain.    She almost fell  last week- tripped on hanger on the floor on floor in hallway- was able to catch self.    Has been taking BC powders for the pain- 6 pack of BC powder in 1-2 days.  Substituted for marijuana.     Tylenol doesn't help.   Taking cymbalta 30 mg in AM- didn't increase to 60 mg daily-  Still making her ill.  Took a whole bottle of Zofran - didn't know which medicine was making her ill.  Never tried Lyrica.  Has tried norco- when had surgery-  Has tried naproxen- didn't help Tramadol helped some- but "not enough".  Stopped right after finished therapy.     Pain Inventory Average Pain 10 Pain Right Now 7 My pain is sharp and aching  In the last 24 hours, has pain interfered with the following? General activity 0 Relation with others 0 Enjoyment of life 0 What TIME of day is your pain at its worst? night Sleep (in general) Fair  Pain is worse with: bending and some activites Pain improves with: heat/ice and medication Relief from Meds: 8  Family History  Problem Relation Age of Onset   Stomach cancer Mother    HIV Mother    Cancer Mother    Colon cancer Sister 75   Breast cancer Neg Hx    Rectal cancer Neg Hx    Social History   Socioeconomic History   Marital status: Married    Spouse name: Not on file   Number of children: Not on file   Years of education: Not on file   Highest education level: Not on file  Occupational History   Not on file  Tobacco Use   Smoking status: Former    Packs/day: 0.25    Types: Cigarettes   Smokeless tobacco: Never  Vaping Use   Vaping Use: Never used  Substance and  Sexual Activity   Alcohol use: No   Drug use: No   Sexual activity: Yes    Birth control/protection: Surgical    Comment: BTL  Other Topics Concern   Not on file  Social History Narrative   Not on file   Social Determinants of Health   Financial Resource Strain: Not on file  Food Insecurity: Not on file  Transportation Needs: Not on file  Physical Activity: Not on file  Stress: Not on file  Social Connections: Not on file   Past Surgical History:  Procedure Laterality Date   BILATERAL ANTERIOR TOTAL HIP ARTHROPLASTY Bilateral 03/25/2017   Procedure: BILATERAL ANTERIOR TOTAL HIP ARTHROPLASTY;  Surgeon: Leandrew Koyanagi, MD;  Location: [facility];  Service: Orthopedics;  Laterality: Bilateral;   DENTAL SURGERY     all teeth extracted   INTRAVASCULAR ULTRASOUND/IVUS N/A 06/09/2019   Procedure: INTRAVASCULAR ULTRASOUND/IVUS;  Surgeon: Elam Dutch, MD;  Location: [facility];  Service: Cardiovascular;  Laterality: N/A;   LAMINECTOMY AND MICRODISCECTOMY LUMBAR SPINE   06/27/2010   ORIF ANKLE FRACTURE Left 03/17/2019   Procedure: OPEN REDUCTION INTERNAL FIXATION (ORIF) LEFT ANKLE FRACTURE;  Surgeon: Leandrew Koyanagi,  MD;  Location: [place];  Service: Orthopedics;  Laterality: Left;   PERIPHERAL VASCULAR INTERVENTION Left 06/09/2019   Procedure: PERIPHERAL VASCULAR INTERVENTION;  Surgeon: Elam Dutch, MD;  Location: [facility];  Service: Cardiovascular;  Laterality: Left;  Common Iliac vein   PERIPHERAL VASCULAR THROMBECTOMY Left 06/09/2019   Procedure: PERIPHERAL VASCULAR THROMBECTOMY;  Surgeon: Elam Dutch, MD;  Location: [facility];  Service: Cardiovascular;  Laterality: Left;   TUBAL LIGATION Bilateral    Past Surgical History:  Procedure Laterality Date   BILATERAL ANTERIOR TOTAL HIP ARTHROPLASTY Bilateral 03/25/2017   Procedure: BILATERAL ANTERIOR TOTAL HIP ARTHROPLASTY;  Surgeon: Leandrew Koyanagi, MD;  Location: [place] [facility];  Service:  Orthopedics;  Laterality: Bilateral;   DENTAL SURGERY     all teeth extracted   INTRAVASCULAR ULTRASOUND/IVUS N/A 06/09/2019   Procedure: INTRAVASCULAR ULTRASOUND/IVUS;  Surgeon: Elam Dutch, MD;  Location: [facility];  Service: Cardiovascular;  Laterality: N/A;   LAMINECTOMY AND MICRODISCECTOMY LUMBAR SPINE   06/27/2010   ORIF ANKLE FRACTURE Left 03/17/2019   Procedure: OPEN REDUCTION INTERNAL FIXATION (ORIF) LEFT ANKLE FRACTURE;  Surgeon: Leandrew Koyanagi, MD;  Location: [place];  Service: Orthopedics;  Laterality: Left;   PERIPHERAL VASCULAR INTERVENTION Left 06/09/2019   Procedure: PERIPHERAL VASCULAR INTERVENTION;  Surgeon: Elam Dutch, MD;  Location: [facility];  Service: Cardiovascular;  Laterality: Left;  Common Iliac vein   PERIPHERAL VASCULAR THROMBECTOMY Left 06/09/2019   Procedure: PERIPHERAL VASCULAR THROMBECTOMY;  Surgeon: Elam Dutch, MD;  Location: [facility];  Service: Cardiovascular;  Laterality: Left;   TUBAL LIGATION Bilateral    Past Medical History:  Diagnosis Date   Anemia    low iron during pregnancy   Anxiety    Arthritis    Asthma    as a child   Bipolar disorder ([place])    Depression    GERD (gastroesophageal reflux disease)    Headache    Hypertension    Left trimalleolar fracture    Pneumonia    as a child   PONV (postoperative nausea and vomiting)    Restless legs    BP (!) 131/98   Pulse 82   Ht 5' 1.5" (1.562 m)   Wt 204 lb (92.5 kg)   SpO2 98%   BMI 37.92 kg/m   Opioid Risk Score:   Fall Risk Score:  `1  Depression screen [facility] 2/9     05/01/2022   10:56 AM 02/12/2022    1:08 PM 10/13/2021   11:30 AM 05/07/2021    4:10 PM 04/23/2021    9:53 PM 10/23/2020    3:28 PM 05/23/2020    3:57 PM  Depression screen PHQ 2/9  Decreased Interest 0 0 1 0 0 0 2  Down, Depressed, Hopeless 0 0 0 0 0 0 0  PHQ - 2 Score 0 0 1 0 0 0 2  Altered sleeping   1    0  Tired, decreased energy   0    3   Change in appetite   0    0  Feeling bad or failure about yourself    0    0  Trouble concentrating   0    0  Moving slowly or fidgety/restless   0    0  Suicidal thoughts   0    0  PHQ-9 Score   2    5  Difficult doing work/chores  Not difficult at all    Not difficult at all     Review of Systems  Constitutional: Negative.   HENT: Negative.    Eyes: Negative.   Respiratory: Negative.    Cardiovascular: Negative.   Gastrointestinal: Negative.   Endocrine: Negative.   Genitourinary: Negative.   Musculoskeletal:  Positive for back pain.  Skin: Negative.   Allergic/Immunologic: Negative.   Neurological: Negative.   Hematological: Negative.   Psychiatric/Behavioral: Negative.        Objective:   Physical Exam        Assessment & Plan:   Pt is a 51 yr old female with hx of back surgery in 2011; s/p laminectomy- s/p B/L hip replacements- anterior; and provoked PE/LLE DVT- s/p L ankle surgery- also has chronic migraines; also has glaucoma;  Here for evaluation of chronic pain.   Duloxetine has likely been the medicine making her feel nauseated- so we will stop it. Can stop immediately without a taper  2.  Lyrica/Pregabalin 100 mg nightly x 1 week, then increase to 100 mg in Am and 200 mg nightly- for nerve pain. Will notice an improvement with nerve pain when on higher dose of medicine.   3. Will do opiate contract and UDS- urine drug screen- today- if UDS negative, will then try Tramadol 50 mg 3x/day as needed for pain.  Can send in a 7 day supply first, per federal guidelines, then 30 day supply if everything ok.   4. F/U in 3 months, but call in 1 month to let me know how things going.    I spent a total of 22   minutes on total care today- >50% coordination of care- due to discussion of options for pain control.

## 2022-05-01 NOTE — Addendum Note (Signed)
Addended by: Rae Mar on: 05/01/2022 04:46 PM   Modules accepted: Orders

## 2022-05-02 ENCOUNTER — Encounter: Payer: Self-pay | Admitting: Physical Medicine and Rehabilitation

## 2022-05-02 MED ORDER — LOSARTAN POTASSIUM 50 MG PO TABS: 50.0000 mg | ORAL_TABLET | Freq: Every day | ORAL | 1 refills | Status: DC

## 2022-05-04 ENCOUNTER — Encounter: Payer: Self-pay | Admitting: Emergency Medicine

## 2022-05-04 ENCOUNTER — Encounter: Payer: Self-pay | Admitting: Physical Medicine and Rehabilitation

## 2022-05-04 ENCOUNTER — Other Ambulatory Visit: Payer: Self-pay | Admitting: *Deleted

## 2022-05-04 ENCOUNTER — Telehealth: Payer: Self-pay

## 2022-05-04 DIAGNOSIS — Z760 Encounter for issue of repeat prescription: Secondary | ICD-10-CM

## 2022-05-04 DIAGNOSIS — I10 Essential (primary) hypertension: Secondary | ICD-10-CM

## 2022-05-04 MED ORDER — LOSARTAN POTASSIUM 50 MG PO TABS: 50.0000 mg | ORAL_TABLET | Freq: Every day | ORAL | 1 refills | Status: DC

## 2022-05-04 NOTE — Telephone Encounter (Signed)
Patient calling about UDS results and pain medication.

## 2022-05-04 NOTE — Telephone Encounter (Signed)
Called patient to inform her that her BP medication was sent to the pharmacy last week. Pharmacy states they didn't get request. Re sent medication to pharmacy on file

## 2022-05-05 NOTE — Telephone Encounter (Signed)
Attempted to reach out to patient unable to leave VM.

## 2022-05-05 NOTE — Telephone Encounter (Signed)
Spoke w/ patient advised results have not came back yet, as soon as results come back someone will reach out to her.

## 2022-05-07 ENCOUNTER — Telehealth: Payer: Self-pay | Admitting: *Deleted

## 2022-05-07 LAB — TOXASSURE SELECT,+ANTIDEPR,UR

## 2022-05-07 NOTE — Telephone Encounter (Signed)
Urine drug screen is positive for marijuana. Cyclobenzaprine is a prescribed medication.

## 2022-05-12 ENCOUNTER — Encounter (HOSPITAL_COMMUNITY): Payer: Self-pay | Admitting: Emergency Medicine

## 2022-05-12 ENCOUNTER — Ambulatory Visit (HOSPITAL_COMMUNITY)
Admission: EM | Admit: 2022-05-12 | Discharge: 2022-05-12 | Disposition: A | Discharge status: Home / Self Care | Payer: Medicare Other | Attending: Internal Medicine | Admitting: Internal Medicine

## 2022-05-12 DIAGNOSIS — R519 Headache, unspecified: Secondary | ICD-10-CM | POA: Insufficient documentation

## 2022-05-12 DIAGNOSIS — R051 Acute cough: Secondary | ICD-10-CM | POA: Diagnosis not present

## 2022-05-12 DIAGNOSIS — J069 Acute upper respiratory infection, unspecified: Secondary | ICD-10-CM | POA: Insufficient documentation

## 2022-05-12 DIAGNOSIS — Z20822 Contact with and (suspected) exposure to covid-19: Secondary | ICD-10-CM | POA: Diagnosis not present

## 2022-05-12 MED ORDER — IBUPROFEN 800 MG PO TABS
800.0000 mg | ORAL_TABLET | Freq: Once | ORAL | Status: AC
  Administered 2022-05-12: 800 mg via ORAL

## 2022-05-12 MED ORDER — BENZONATATE 100 MG PO CAPS: 100.0000 mg | ORAL_CAPSULE | Freq: Three times a day (TID) | ORAL | 0 refills | Status: DC

## 2022-05-12 MED ORDER — IBUPROFEN 800 MG PO TABS
ORAL_TABLET | ORAL | Status: AC
  Filled 2022-05-12: qty 1

## 2022-05-12 MED ORDER — GUAIFENESIN ER 600 MG PO TB12: 1200.0000 mg | ORAL_TABLET | Freq: Two times a day (BID) | ORAL | 0 refills | Status: DC

## 2022-05-12 NOTE — ED Provider Notes (Signed)
[facility]    CSN: [id number] Arrival date & time: 05/12/22  1135      History   Chief Complaint Chief Complaint  Patient presents with   Cough   Covid Exposure    HPI Patricia Davila is a 51 y.o. female.   Patient presents to urgent care for evaluation of dry cough and fatigue that started yesterday after dinner. Daughter who lives in the same household tested positive for COVID-19 on Friday May 08, 2022 (4 days ago) and has been quarantining in her room to avoid spreading illness to rest of family. Reports chills and sweats overnight and through the morning. Believes she has had a fever but has not checked. Cough is bothering her the most at nighttime and early in the morning. Generalized headache is currently a 6 on a scale of 0-10. No abdominal pain, dizziness, nausea, vomiting, blurry vision, decreased visual acuity, sore throat, or ear pain. Reports body aches mostly to her lower back started yesterday. She has not attempted use of home remedies or over the counter medications for symptoms prior to arrival at urgent care.  Patient is not vaccinated against COVID-19. She does not smoke or vape and denies drug use. Denies previous hospitalization related to COVID-19. Reports history of asthma "as a kid" but has not had problems with this as an adult.    Cough   Past Medical History:  Diagnosis Date   Anemia    low iron during pregnancy   Anxiety    Arthritis    Asthma    as a child   Bipolar disorder ([place])    Depression    GERD (gastroesophageal reflux disease)    Headache    Hypertension    Left trimalleolar fracture    Pneumonia    as a child   PONV (postoperative nausea and vomiting)    Restless legs     Patient Active Problem List   Diagnosis Date Noted   Nonintractable chronic migraine 02/12/2022   Dyslipidemia 07/02/2021   Chronic pain syndrome 07/02/2021   History of blood clots 07/02/2021   Erosive osteoarthritis of multiple sites  07/02/2021   Displaced trimalleolar fracture of left lower leg, subsequent encounter for closed fracture with delayed healing 03/17/2019   Primary osteoarthritis of right hip 04/08/2017   History of hip replacement 03/25/2017   Primary osteoarthritis of left hip 12/22/2016   Major depressive disorder 10/24/2012   Headache(784.0) 05/13/2010   Microalbuminuria 04/16/2009   Left hip pain 11/22/2008   Bipolar 1 disorder, depressed ([place]) 05/17/2007   Lumbosacral degenerative disk disease 03/23/2005   Hypertension 08/18/2003    Past Surgical History:  Procedure Laterality Date   BILATERAL ANTERIOR TOTAL HIP ARTHROPLASTY Bilateral 03/25/2017   Procedure: BILATERAL ANTERIOR TOTAL HIP ARTHROPLASTY;  Surgeon: Leandrew Koyanagi, MD;  Location: [place];  Service: Orthopedics;  Laterality: Bilateral;   DENTAL SURGERY     all teeth extracted   INTRAVASCULAR ULTRASOUND/IVUS N/A 06/09/2019   Procedure: INTRAVASCULAR ULTRASOUND/IVUS;  Surgeon: Elam Dutch, MD;  Location: [facility];  Service: Cardiovascular;  Laterality: N/A;   LAMINECTOMY AND MICRODISCECTOMY LUMBAR SPINE   06/27/2010   ORIF ANKLE FRACTURE Left 03/17/2019   Procedure: OPEN REDUCTION INTERNAL FIXATION (ORIF) LEFT ANKLE FRACTURE;  Surgeon: Leandrew Koyanagi, MD;  Location: [facility];  Service: Orthopedics;  Laterality: Left;   PERIPHERAL VASCULAR INTERVENTION Left 06/09/2019   Procedure: PERIPHERAL VASCULAR INTERVENTION;  Surgeon: Elam Dutch, MD;  Location: [facility];  Service: Cardiovascular;  Laterality: Left;  Common Iliac vein   PERIPHERAL VASCULAR THROMBECTOMY Left 06/09/2019   Procedure: PERIPHERAL VASCULAR THROMBECTOMY;  Surgeon: Elam Dutch, MD;  Location: [facility];  Service: Cardiovascular;  Laterality: Left;   TUBAL LIGATION Bilateral     OB History     Gravida  3   Para  3   Term  3   Preterm      AB      Living  3      SAB      IAB      Ectopic       Multiple      Live Births  3            Home Medications    Prior to Admission medications   Medication Sig Start Date End Date Taking? Authorizing Provider  benzonatate (TESSALON) 100 MG capsule Take 1 capsule (100 mg total) by mouth every 8 (eight) hours. 05/12/22  Yes Talbot Grumbling, FNP  guaiFENesin (MUCINEX) 600 MG 12 hr tablet Take 2 tablets (1,200 mg total) by mouth 2 (two) times daily. 05/12/22  Yes Talbot Grumbling, FNP  amLODipine (NORVASC) 10 MG tablet TAKE 1 TABLET(10 MG) BY MOUTH DAILY 04/28/22   Horald Pollen, MD  aspirin EC 81 MG tablet Take 81 mg by mouth daily. Swallow whole.    [provider]  atorvastatin (LIPITOR) 10 MG tablet TAKE 1 TABLET(10 MG) BY MOUTH DAILY Patient not taking: Reported on 02/12/2022 12/16/21   Gildardo Pounds, NP  Cholecalciferol (VITAMIN D3) 50 MCG (2000 UT) TABS TAKE 1 TABLET BY MOUTH EVERY DAY 08/14/19   Kerin Perna, NP  cyclobenzaprine (FLEXERIL) 5 MG tablet TAKE 1-2 TABLETS BY MOUTH 3 TIMES DAILY AS NEEDED FOR MUSCLE SPASMS 04/28/22   Horald Pollen, MD  DULoxetine (CYMBALTA) 30 MG capsule Take 1 capsule (30 mg total) by mouth daily. X 1 week, then 60 mg daily- for nerve and back pain 10/13/21   Lovorn, Jinny Blossom, MD  Galcanezumab-gnlm (EMGALITY) 120 MG/ML SOAJ Inject 120 mg into the skin every 30 (thirty) days. 03/10/22   Genia Harold, MD  ibuprofen (ADVIL) 800 MG tablet Take 1 tablet (800 mg total) by mouth every 8 (eight) hours as needed (pain). 11/11/21   Barrett Henle, MD  lidocaine (XYLOCAINE) 5 % ointment Apply 1 application topically 4 (four) times daily. Apply with voltaren gel 4x/day to L ankle. Dime sized amount- for burning/tingling. 10/13/21   Lovorn, Jinny Blossom, MD  losartan (COZAAR) 50 MG tablet Take 1 tablet (50 mg total) by mouth daily. 05/04/22   Horald Pollen, MD  Multiple Vitamin (MULTIVITAMIN) tablet Take 1 tablet by mouth daily.    [provider]  omeprazole (PRILOSEC)  40 MG capsule Take 1 capsule (40 mg total) by mouth daily. 03/12/22   Horald Pollen, MD  pregabalin (LYRICA) 100 MG capsule Take 1 capsule (100 mg total) by mouth 3 (three) times daily. Take 1 tab nightly x 1 week, then 100 mg in AM and 200 mg night- for nerve pain 05/01/22   Lovorn, Jinny Blossom, MD  venlafaxine XR (EFFEXOR XR) 37.5 MG 24 hr capsule Take 1 capsule (37.5 mg total) by mouth daily with breakfast. 09/23/21   Genia Harold, MD  Vitamin D, Ergocalciferol, (DRISDOL) 1.25 MG (50000 UNIT) CAPS capsule Take 1 capsule (50,000 Units total) by mouth every 7 (seven) days. 05/22/21   Gildardo Pounds, NP  zolmitriptan (ZOMIG) 5 MG tablet Take 1 tablet (5 mg total) by mouth as needed for migraine. May repeat dose in 2 hours if headache persists. Max dose 2 pills in 24 hours Patient not taking: Reported on 03/18/2022 03/10/22   Genia Harold, MD    Family History Family History  Problem Relation Age of Onset   Stomach cancer Mother    HIV Mother    Cancer Mother    Colon cancer Sister 76   Breast cancer Neg Hx    Rectal cancer Neg Hx     Social History Social History   Tobacco Use   Smoking status: Former    Packs/day: 0.25    Types: Cigarettes   Smokeless tobacco: Never  Vaping Use   Vaping Use: Never used  Substance Use Topics   Alcohol use: No   Drug use: No     Allergies   Ativan [lorazepam], Anesthesia s-i-40 [propofol], Ditropan [oxybutynin], and Tylenol with codeine #3 [acetaminophen-codeine]   Review of Systems Review of Systems  Respiratory:  Positive for cough.   Per HPI   Physical Exam Triage Vital Signs ED Triage Vitals [05/12/22 1246]  Enc Vitals Group     BP 117/72     Pulse Rate 94     Resp 18     Temp 97.9 F (36.6 C)     Temp Source Oral     SpO2 100 %     Weight      Height      Head Circumference      Peak Flow      Pain Score      Pain Loc      Pain Edu?      Excl. in [place]?    No data found.  Updated Vital Signs BP 117/72 (BP  Location: Left Arm)   Pulse 94   Temp 97.9 F (36.6 C) (Oral)   Resp 18   SpO2 100%   Visual Acuity Right Eye Distance:   Left Eye Distance:   Bilateral Distance:    Right Eye Near:   Left Eye Near:    Bilateral Near:     Physical Exam Vitals and nursing note reviewed.  Constitutional:      Appearance: She is obese. She is not ill-appearing or toxic-appearing.  HENT:     Head: Normocephalic and atraumatic.     Right Ear: Hearing, tympanic membrane, ear canal and external ear normal.     Left Ear: Hearing, tympanic membrane, ear canal and external ear normal.     Nose: Congestion present.     Mouth/Throat:     Lips: Pink.     Mouth: Mucous membranes are moist.     Pharynx: Posterior oropharyngeal erythema present.  Eyes:     General: Lids are normal. Vision grossly intact. Gaze aligned appropriately.     Extraocular Movements: Extraocular movements intact.     Conjunctiva/sclera: Conjunctivae normal.     Pupils: Pupils are equal, round, and reactive to light.  Cardiovascular:     Rate and Rhythm: Normal rate and regular rhythm.     Heart sounds: Normal heart sounds, S1 normal and S2 normal.  Pulmonary:     Effort: Pulmonary effort is normal. No respiratory distress.     Breath sounds: Normal breath sounds and air entry.  Abdominal:     General: Bowel sounds are normal.     Palpations: Abdomen is soft.     Tenderness: There is no abdominal tenderness. There  is no right CVA tenderness, left CVA tenderness or guarding.  Musculoskeletal:     Cervical back: Normal range of motion and neck supple.  Lymphadenopathy:     Cervical: Cervical adenopathy present.  Skin:    General: Skin is warm and dry.     Capillary Refill: Capillary refill takes less than 2 seconds.     Findings: No rash.  Neurological:     General: No focal deficit present.     Mental Status: She is alert and oriented to person, place, and time. Mental status is at baseline.     Cranial Nerves: No  dysarthria or facial asymmetry.  Psychiatric:        Mood and Affect: Mood normal.        Speech: Speech normal.        Behavior: Behavior normal.        Thought Content: Thought content normal.        Judgment: Judgment normal.      UC Treatments / Results  Labs (all labs ordered are listed, but only abnormal results are displayed) Labs Reviewed  SARS CORONAVIRUS 2 (TAT 6-24 HRS)    EKG   Radiology No results found.  Procedures Procedures (including critical care time)  Medications Ordered in UC Medications  ibuprofen (ADVIL) tablet 800 mg (800 mg Oral Given 05/12/22 1321)    Initial Impression / Assessment and Plan / UC Course  I have reviewed the triage vital signs and the nursing notes.  Pertinent labs & imaging results that were available during my care of the patient were reviewed by me and considered in my medical decision making (see chart for details).   Viral URI with cough Symptoms and physical exam consistent with a viral upper respiratory tract infection that will likely resolve with rest, fluids, and prescriptions for symptomatic relief. No indication for imaging today based on stable cardiopulmonary exam and hemodynamically stable vital signs. COVID-19 testing is pending.  We will call patient if this is positive.  Quarantine guidelines discussed. Currently on day 2 of symptoms and does qualify for antiviral therapy if indicated due to comorbidity.   Patient given ibuprofen '800mg'$  in clinic today for headache and body aches. Guaifenesin and Tessalon Perles sent to the pharmacy to be used for nasal congestion and cough. May use ibuprofen/tylenol over the counter for body aches, fever/chills, and overall discomfort associated with viral illness. Nonpharmacologic interventions for symptom relief provided and after visit summary below.    Strict ED/urgent care return precautions given.  Patient verbalizes understanding and agreement with plan.  Counseled patient  regarding possible side effects and uses of all medications prescribed at today's visit.  Patient verbalizes understanding and agreement with plan.  All questions answered.  Patient discharged from urgent care in stable condition.          Final Clinical Impressions(s) / UC Diagnoses   Final diagnoses:  Viral URI with cough  Acute cough  Bad headache     Discharge Instructions      You have a viral upper respiratory infection.  COVID-19 testing is pending.  We will call you with positive result.  Stay at home until day 6 of symptoms and wear a mask in public until day 11 of symptoms then spread of virus to the community. Work note was at the end [employer].  Take guaifenesin '1200mg'$   2 times daily to thin your mucous so that you can cough it up and blow it out of your nose  easier. Drink plenty of water while taking this medication so that it works well in your body (at least 8 cups a day).   Take tessalon pearles every 8 hours as needed for cough.  You may take tylenol 1,'000mg'$  and ibuprofen '600mg'$  every 6 hours with food as needed for fever/chills, sore throat, aches/pains, and inflammation associated with viral illness. Take this with food to avoid stomach upset.    You may do salt water and baking soda gargles every 4 hours as needed for your throat pain.  Please put 1 teaspoon of salt and 1/2 teaspoon of baking soda in 8 ounces of warm water then gargle and spit the water out. You may also put 1 tablespoon of honey in warm water and drink this to soothe your throat.  Place a humidifier in your room at night to help decrease dry air that can irritate your airway and cause you to have a sore throat and cough.  Please try to eat a well-balanced diet while you are sick so that your body gets proper nutrition to heal.  If you develop any new or worsening symptoms, please return.  If your symptoms are severe, please go to the emergency room.  Follow-up with your primary care provider for  further evaluation and management of your symptoms as well as ongoing wellness visits.  I hope you feel better!      ED Prescriptions     Medication Sig Dispense Auth. Provider   guaiFENesin (MUCINEX) 600 MG 12 hr tablet Take 2 tablets (1,200 mg total) by mouth 2 (two) times daily. 30 tablet Joella Prince M, FNP   benzonatate (TESSALON) 100 MG capsule Take 1 capsule (100 mg total) by mouth every 8 (eight) hours. 21 capsule Talbot Grumbling, FNP      PDMP not reviewed this encounter.   Talbot Grumbling, [place] 05/12/22 1336

## 2022-05-12 NOTE — Discharge Instructions (Signed)
You have a viral upper respiratory infection.  COVID-19 testing is pending.  We will call you with positive result.  Stay at home until day 6 of symptoms and wear a mask in public until day 11 of symptoms then spread of virus to the community. Work note was at the end [employer].  Take guaifenesin '1200mg'$   2 times daily to thin your mucous so that you can cough it up and blow it out of your nose easier. Drink plenty of water while taking this medication so that it works well in your body (at least 8 cups a day).   Take tessalon pearles every 8 hours as needed for cough.  You may take tylenol 1,'000mg'$  and ibuprofen '600mg'$  every 6 hours with food as needed for fever/chills, sore throat, aches/pains, and inflammation associated with viral illness. Take this with food to avoid stomach upset.    You may do salt water and baking soda gargles every 4 hours as needed for your throat pain.  Please put 1 teaspoon of salt and 1/2 teaspoon of baking soda in 8 ounces of warm water then gargle and spit the water out. You may also put 1 tablespoon of honey in warm water and drink this to soothe your throat.  Place a humidifier in your room at night to help decrease dry air that can irritate your airway and cause you to have a sore throat and cough.  Please try to eat a well-balanced diet while you are sick so that your body gets proper nutrition to heal.  If you develop any new or worsening symptoms, please return.  If your symptoms are severe, please go to the emergency room.  Follow-up with your primary care provider for further evaluation and management of your symptoms as well as ongoing wellness visits.  I hope you feel better!

## 2022-05-12 NOTE — ED Triage Notes (Signed)
Pt's daughter tested covid positive on Friday. Pt having dry cough and wants to be tested.

## 2022-05-13 LAB — SARS CORONAVIRUS 2 (TAT 6-24 HRS): SARS Coronavirus 2: NEGATIVE

## 2022-06-08 ENCOUNTER — Ambulatory Visit: Payer: Medicare Other | Admitting: Psychiatry

## 2022-06-17 ENCOUNTER — Ambulatory Visit: Payer: Medicare Other | Admitting: Registered Nurse

## 2022-07-29 ENCOUNTER — Ambulatory Visit
Admission: EM | Admit: 2022-07-29 | Discharge: 2022-07-29 | Disposition: A | Discharge status: Home / Self Care | Payer: Medicare Other | Attending: Physician Assistant | Admitting: Physician Assistant

## 2022-07-29 ENCOUNTER — Ambulatory Visit: Payer: Medicare Other | Admitting: Physical Medicine and Rehabilitation

## 2022-07-29 ENCOUNTER — Encounter: Payer: Self-pay | Admitting: Emergency Medicine

## 2022-07-29 DIAGNOSIS — J069 Acute upper respiratory infection, unspecified: Secondary | ICD-10-CM

## 2022-07-29 DIAGNOSIS — Z1152 Encounter for screening for COVID-19: Secondary | ICD-10-CM | POA: Diagnosis not present

## 2022-07-29 LAB — POCT INFLUENZA A/B
Influenza A, POC: NEGATIVE
Influenza B, POC: NEGATIVE

## 2022-07-29 NOTE — ED Provider Notes (Signed)
[facility]    CSN: [id number] Arrival date & time: 07/29/22  0807      History   Chief Complaint Chief Complaint  Patient presents with   URI   Headache   Generalized Body Aches    HPI Patricia Davila is a 51 y.o. female.   Patient here today for evaluation of chills, subjective fever, nonproductive cough, congestion, nasal drainage and body aches that started 3 days ago.  She has also had headache.  Multiple members of her family have similar symptoms.  She has not had any vomiting but does report some nausea.  She denies any diarrhea. Has been taking nyquil and zicam with mild relief.   The history is provided by the patient.  URI Presenting symptoms: congestion, cough, fever, rhinorrhea and sore throat   Associated symptoms: headaches   Headache Associated symptoms: congestion, cough, fever, nausea, sore throat and URI   Associated symptoms: no diarrhea and no vomiting     Past Medical History:  Diagnosis Date   Anemia    low iron during pregnancy   Anxiety    Arthritis    Asthma    as a child   Bipolar disorder ([place])    Depression    GERD (gastroesophageal reflux disease)    Headache    Hypertension    Left trimalleolar fracture    Pneumonia    as a child   PONV (postoperative nausea and vomiting)    Restless legs     Patient Active Problem List   Diagnosis Date Noted   Nonintractable chronic migraine 02/12/2022   Dyslipidemia 07/02/2021   Chronic pain syndrome 07/02/2021   History of blood clots 07/02/2021   Erosive osteoarthritis of multiple sites 07/02/2021   Displaced trimalleolar fracture of left lower leg, subsequent encounter for closed fracture with delayed healing 03/17/2019   Primary osteoarthritis of right hip 04/08/2017   History of hip replacement 03/25/2017   Primary osteoarthritis of left hip 12/22/2016   Major depressive disorder 10/24/2012   Headache(784.0) 05/13/2010   Microalbuminuria 04/16/2009   Left hip pain  11/22/2008   Bipolar 1 disorder, depressed ([place]) 05/17/2007   Lumbosacral degenerative disk disease 03/23/2005   Hypertension 08/18/2003    Past Surgical History:  Procedure Laterality Date   BILATERAL ANTERIOR TOTAL HIP ARTHROPLASTY Bilateral 03/25/2017   Procedure: BILATERAL ANTERIOR TOTAL HIP ARTHROPLASTY;  Surgeon: Leandrew Koyanagi, MD;  Location: [place];  Service: Orthopedics;  Laterality: Bilateral;   DENTAL SURGERY     all teeth extracted   INTRAVASCULAR ULTRASOUND/IVUS N/A 06/09/2019   Procedure: INTRAVASCULAR ULTRASOUND/IVUS;  Surgeon: Elam Dutch, MD;  Location: [facility];  Service: Cardiovascular;  Laterality: N/A;   LAMINECTOMY AND MICRODISCECTOMY LUMBAR SPINE   06/27/2010   ORIF ANKLE FRACTURE Left 03/17/2019   Procedure: OPEN REDUCTION INTERNAL FIXATION (ORIF) LEFT ANKLE FRACTURE;  Surgeon: Leandrew Koyanagi, MD;  Location: [place];  Service: Orthopedics;  Laterality: Left;   PERIPHERAL VASCULAR INTERVENTION Left 06/09/2019   Procedure: PERIPHERAL VASCULAR INTERVENTION;  Surgeon: Elam Dutch, MD;  Location: [facility];  Service: Cardiovascular;  Laterality: Left;  Common Iliac vein   PERIPHERAL VASCULAR THROMBECTOMY Left 06/09/2019   Procedure: PERIPHERAL VASCULAR THROMBECTOMY;  Surgeon: Elam Dutch, MD;  Location: [facility];  Service: Cardiovascular;  Laterality: Left;   TUBAL LIGATION Bilateral     OB History     Gravida  3   Para  3  Term  3   Preterm      AB      Living  3      SAB      IAB      Ectopic      Multiple      Live Births  3            Home Medications    Prior to Admission medications   Medication Sig Start Date End Date Taking? Authorizing Provider  amLODipine (NORVASC) 10 MG tablet TAKE 1 TABLET(10 MG) BY MOUTH DAILY 04/28/22   Horald Pollen, MD  aspirin EC 81 MG tablet Take 81 mg by mouth daily. Swallow whole.    [provider]  atorvastatin (LIPITOR) 10  MG tablet TAKE 1 TABLET(10 MG) BY MOUTH DAILY Patient not taking: Reported on 02/12/2022 12/16/21   Gildardo Pounds, NP  benzonatate (TESSALON) 100 MG capsule Take 1 capsule (100 mg total) by mouth every 8 (eight) hours. 05/12/22   Talbot Grumbling, FNP  Cholecalciferol (VITAMIN D3) 50 MCG (2000 UT) TABS TAKE 1 TABLET BY MOUTH EVERY DAY 08/14/19   Kerin Perna, NP  cyclobenzaprine (FLEXERIL) 5 MG tablet TAKE 1-2 TABLETS BY MOUTH 3 TIMES DAILY AS NEEDED FOR MUSCLE SPASMS 04/28/22   Horald Pollen, MD  DULoxetine (CYMBALTA) 30 MG capsule Take 1 capsule (30 mg total) by mouth daily. X 1 week, then 60 mg daily- for nerve and back pain 10/13/21   Lovorn, Jinny Blossom, MD  Galcanezumab-gnlm (EMGALITY) 120 MG/ML SOAJ Inject 120 mg into the skin every 30 (thirty) days. 03/10/22   Genia Harold, MD  guaiFENesin (MUCINEX) 600 MG 12 hr tablet Take 2 tablets (1,200 mg total) by mouth 2 (two) times daily. 05/12/22   Talbot Grumbling, FNP  ibuprofen (ADVIL) 800 MG tablet Take 1 tablet (800 mg total) by mouth every 8 (eight) hours as needed (pain). 11/11/21   Barrett Henle, MD  lidocaine (XYLOCAINE) 5 % ointment Apply 1 application topically 4 (four) times daily. Apply with voltaren gel 4x/day to L ankle. Dime sized amount- for burning/tingling. 10/13/21   Lovorn, Jinny Blossom, MD  losartan (COZAAR) 50 MG tablet Take 1 tablet (50 mg total) by mouth daily. 05/04/22   Horald Pollen, MD  Multiple Vitamin (MULTIVITAMIN) tablet Take 1 tablet by mouth daily.    [provider]  omeprazole (PRILOSEC) 40 MG capsule Take 1 capsule (40 mg total) by mouth daily. 03/12/22   Horald Pollen, MD  pregabalin (LYRICA) 100 MG capsule Take 1 capsule (100 mg total) by mouth 3 (three) times daily. Take 1 tab nightly x 1 week, then 100 mg in AM and 200 mg night- for nerve pain 05/01/22   Lovorn, Jinny Blossom, MD  venlafaxine XR (EFFEXOR XR) 37.5 MG 24 hr capsule Take 1 capsule (37.5 mg total) by mouth daily with  breakfast. 09/23/21   Genia Harold, MD  Vitamin D, Ergocalciferol, (DRISDOL) 1.25 MG (50000 UNIT) CAPS capsule Take 1 capsule (50,000 Units total) by mouth every 7 (seven) days. 05/22/21   Gildardo Pounds, NP  zolmitriptan (ZOMIG) 5 MG tablet Take 1 tablet (5 mg total) by mouth as needed for migraine. May repeat dose in 2 hours if headache persists. Max dose 2 pills in 24 hours Patient not taking: Reported on 03/18/2022 03/10/22   Genia Harold, MD    Family History Family History  Problem Relation Age of Onset   Stomach cancer Mother    HIV Mother  Cancer Mother    Colon cancer Sister 94   Breast cancer Neg Hx    Rectal cancer Neg Hx     Social History Social History   Tobacco Use   Smoking status: Former    Packs/day: 0.25    Types: Cigarettes   Smokeless tobacco: Never  Vaping Use   Vaping Use: Never used  Substance Use Topics   Alcohol use: No   Drug use: No     Allergies   Ativan [lorazepam], Anesthesia s-i-40 [propofol], Ditropan [oxybutynin], and Tylenol with codeine #3 [acetaminophen-codeine]   Review of Systems Review of Systems  Constitutional:  Positive for chills and fever.  HENT:  Positive for congestion, rhinorrhea and sore throat.   Eyes:  Negative for discharge and redness.  Respiratory:  Positive for cough.   Gastrointestinal:  Positive for nausea. Negative for diarrhea and vomiting.  Neurological:  Positive for headaches.     Physical Exam Triage Vital Signs ED Triage Vitals  Enc Vitals Group     BP 07/29/22 0929 127/84     Pulse Rate 07/29/22 0929 71     Resp 07/29/22 0929 18     Temp 07/29/22 0929 99 F (37.2 C)     Temp Source 07/29/22 0929 Oral     SpO2 07/29/22 0929 98 %     Weight --      Height --      Head Circumference --      Peak Flow --      Pain Score 07/29/22 0928 6     Pain Loc --      Pain Edu? --      Excl. in [place]? --    No data found.  Updated Vital Signs BP 127/84 (BP Location: Left Arm)   Pulse 71   Temp  99 F (37.2 C) (Oral)   Resp 18   SpO2 98%     Physical Exam Vitals and nursing note reviewed.  Constitutional:      General: She is not in acute distress.    Appearance: Normal appearance. She is not ill-appearing.  HENT:     Head: Normocephalic and atraumatic.     Right Ear: Tympanic membrane normal.     Nose: Congestion present.     Mouth/Throat:     Mouth: Mucous membranes are moist.     Pharynx: No oropharyngeal exudate or posterior oropharyngeal erythema.  Eyes:     Conjunctiva/sclera: Conjunctivae normal.  Cardiovascular:     Rate and Rhythm: Normal rate and regular rhythm.     Heart sounds: Normal heart sounds. No murmur heard. Pulmonary:     Effort: Pulmonary effort is normal. No respiratory distress.     Breath sounds: Normal breath sounds. No wheezing, rhonchi or rales.  Skin:    General: Skin is warm and dry.  Neurological:     Mental Status: She is alert.  Psychiatric:        Mood and Affect: Mood normal.        Thought Content: Thought content normal.      UC Treatments / Results  Labs (all labs ordered are listed, but only abnormal results are displayed) Labs Reviewed  SARS CORONAVIRUS 2 (TAT 6-24 HRS)  POCT INFLUENZA A/B    EKG   Radiology No results found.  Procedures Procedures (including critical care time)  Medications Ordered in UC Medications - No data to display  Initial Impression / Assessment and Plan / UC Course  I have reviewed  the triage vital signs and the nursing notes.  Pertinent labs & imaging results that were available during my care of the patient were reviewed by me and considered in my medical decision making (see chart for details).    Suspect viral etiology of symptoms.  Rapid flu test negative in office.  Will screen for COVID.  Recommend continued symptomatic treatment, increase fluids and follow-up if symptoms fail to improve.  Patient expresses understanding.  Final Clinical Impressions(s) / UC Diagnoses    Final diagnoses:  Acute upper respiratory infection  Encounter for screening for COVID-19   Discharge Instructions   None    ED Prescriptions   None    PDMP not reviewed this encounter.   Francene Finders, PA-C 07/29/22 1037

## 2022-07-29 NOTE — ED Triage Notes (Signed)
Pt presents with chills,non productive cough, congestion, nasal drainage, generalized body aches and headache X 3 days.

## 2022-07-30 LAB — SARS CORONAVIRUS 2 (TAT 6-24 HRS): SARS Coronavirus 2: NEGATIVE

## 2022-07-30 NOTE — Telephone Encounter (Signed)
Recommend to call urgent care clinic and pose the same question

## 2022-08-18 ENCOUNTER — Encounter: Payer: Self-pay | Admitting: Emergency Medicine

## 2022-08-18 ENCOUNTER — Ambulatory Visit (INDEPENDENT_AMBULATORY_CARE_PROVIDER_SITE_OTHER): Payer: Medicare Other | Admitting: Emergency Medicine

## 2022-08-18 VITALS — BP 126/88 | HR 72 | Temp 98.1°F | Ht 61.5 in | Wt 211.2 lb

## 2022-08-18 DIAGNOSIS — I10 Essential (primary) hypertension: Secondary | ICD-10-CM | POA: Diagnosis not present

## 2022-08-18 DIAGNOSIS — Z23 Encounter for immunization: Secondary | ICD-10-CM

## 2022-08-18 DIAGNOSIS — M20011 Mallet finger of right finger(s): Secondary | ICD-10-CM | POA: Diagnosis not present

## 2022-08-18 DIAGNOSIS — F319 Bipolar disorder, unspecified: Secondary | ICD-10-CM | POA: Diagnosis not present

## 2022-08-18 DIAGNOSIS — E785 Hyperlipidemia, unspecified: Secondary | ICD-10-CM

## 2022-08-18 DIAGNOSIS — G894 Chronic pain syndrome: Secondary | ICD-10-CM | POA: Diagnosis not present

## 2022-08-18 MED ORDER — CYCLOBENZAPRINE HCL 5 MG PO TABS: 5.0000 mg | ORAL_TABLET | Freq: Three times a day (TID) | ORAL | 3 refills | Status: DC | PRN

## 2022-08-18 MED ORDER — LIDOCAINE 5 % EX OINT: 1.0000 | TOPICAL_OINTMENT | Freq: Four times a day (QID) | CUTANEOUS | 5 refills | Status: DC

## 2022-08-18 NOTE — Patient Instructions (Signed)

## 2022-08-18 NOTE — Assessment & Plan Note (Signed)
Stable.  Continue Effexor 37.5 mg daily and duloxetine 30 mg daily.

## 2022-08-18 NOTE — Assessment & Plan Note (Signed)
Stable.  Diet and nutrition discussed. Advised to decrease amount of daily carbohydrate intake and daily calories and increase amount of plant-based protein in her diet. Continue atorvastatin 10 mg daily.

## 2022-08-18 NOTE — Assessment & Plan Note (Signed)
Secondary to old injury.  Needs evaluation by hand surgeon. Referral placed today.

## 2022-08-18 NOTE — Progress Notes (Signed)
Patricia Davila 52 y.o.   Chief Complaint  Patient presents with   Follow-up    4mth f/u appt, patient wants to be referred to a hand specialist for her, HX DVT in left leg, patient states her left leg feels tight and having muscle spasms, experiencing numbness in her body     HISTORY OF PRESENT ILLNESS: This is a 52y.o. female complaining of pain and deformity to right hand since injury couple years ago.  Gets occasional numbness and tingling in the hand and forearm. History of chronic back pain.  Back surgery in 2011. Ankle surgery complicated by post procedure DVT left leg couple years ago No other complaints or medical concerns today.  HPI   Prior to Admission medications   Medication Sig Start Date End Date Taking? Authorizing Provider  amLODipine (NORVASC) 10 MG tablet TAKE 1 TABLET(10 MG) BY MOUTH DAILY 04/28/22  Yes Makayli Bracken, MInes Bloomer MD  aspirin EC 81 MG tablet Take 81 mg by mouth daily. Swallow whole.   Yes [provider]  atorvastatin (LIPITOR) 10 MG tablet TAKE 1 TABLET(10 MG) BY MOUTH DAILY 12/16/21  Yes FGildardo Pounds NP  benzonatate (TESSALON) 100 MG capsule Take 1 capsule (100 mg total) by mouth every 8 (eight) hours. 05/12/22  Yes STalbot Grumbling FNP  Cholecalciferol (VITAMIN D3) 50 MCG (2000 UT) TABS TAKE 1 TABLET BY MOUTH EVERY DAY 08/14/19  Yes EKerin Perna NP  cyclobenzaprine (FLEXERIL) 5 MG tablet TAKE 1-2 TABLETS BY MOUTH 3 TIMES DAILY AS NEEDED FOR MUSCLE SPASMS 04/28/22  Yes Jurney Overacker, MInes Bloomer MD  DULoxetine (CYMBALTA) 30 MG capsule Take 1 capsule (30 mg total) by mouth daily. X 1 week, then 60 mg daily- for nerve and back pain 10/13/21  Yes Lovorn, Megan, MD  Galcanezumab-gnlm (EMGALITY) 120 MG/ML SOAJ Inject 120 mg into the skin every 30 (thirty) days. 03/10/22  Yes CGenia Harold MD  lidocaine (XYLOCAINE) 5 % ointment Apply 1 application topically 4 (four) times daily. Apply with voltaren gel 4x/day to L ankle. Dime sized amount-  for burning/tingling. 10/13/21  Yes Lovorn, MJinny Blossom MD  losartan (COZAAR) 50 MG tablet Take 1 tablet (50 mg total) by mouth daily. 05/04/22  Yes Ronnika Collett, MInes Bloomer MD  Multiple Vitamin (MULTIVITAMIN) tablet Take 1 tablet by mouth daily.   Yes [provider]  omeprazole (PRILOSEC) 40 MG capsule Take 1 capsule (40 mg total) by mouth daily. 03/12/22  Yes Olimpia Tinch, MInes Bloomer MD  pregabalin (LYRICA) 100 MG capsule Take 1 capsule (100 mg total) by mouth 3 (three) times daily. Take 1 tab nightly x 1 week, then 100 mg in AM and 200 mg night- for nerve pain 05/01/22  Yes Lovorn, MJinny Blossom MD  venlafaxine XR (EFFEXOR XR) 37.5 MG 24 hr capsule Take 1 capsule (37.5 mg total) by mouth daily with breakfast. 09/23/21  Yes Chima, JAnderson Malta MD  Vitamin D, Ergocalciferol, (DRISDOL) 1.25 MG (50000 UNIT) CAPS capsule Take 1 capsule (50,000 Units total) by mouth every 7 (seven) days. 05/22/21  Yes FGildardo Pounds NP  zolmitriptan (ZOMIG) 5 MG tablet Take 1 tablet (5 mg total) by mouth as needed for migraine. May repeat dose in 2 hours if headache persists. Max dose 2 pills in 24 hours 03/10/22  Yes CGenia Harold MD    Allergies  Allergen Reactions   Ativan [Lorazepam] Anxiety    HALLUCINATIONS   Anesthesia S-I-40 [Propofol]     hallucinations   Ditropan [Oxybutynin]     Anxiety  Tylenol With Codeine #3 [Acetaminophen-Codeine] Hives and Itching    Patient Active Problem List   Diagnosis Date Noted   Nonintractable chronic migraine 02/12/2022   Dyslipidemia 07/02/2021   Chronic pain syndrome 07/02/2021   History of blood clots 07/02/2021   Erosive osteoarthritis of multiple sites 07/02/2021   Displaced trimalleolar fracture of left lower leg, subsequent encounter for closed fracture with delayed healing 03/17/2019   Primary osteoarthritis of right hip 04/08/2017   History of hip replacement 03/25/2017   Primary osteoarthritis of left hip 12/22/2016   Major depressive disorder 10/24/2012    Headache(784.0) 05/13/2010   Microalbuminuria 04/16/2009   Left hip pain 11/22/2008   Bipolar 1 disorder, depressed ([place]) 05/17/2007   Lumbosacral degenerative disk disease 03/23/2005   Hypertension 08/18/2003    Past Medical History:  Diagnosis Date   Anemia    low iron during pregnancy   Anxiety    Arthritis    Asthma    as a child   Bipolar disorder ([place])    Depression    GERD (gastroesophageal reflux disease)    Headache    Hypertension    Left trimalleolar fracture    Pneumonia    as a child   PONV (postoperative nausea and vomiting)    Restless legs     Past Surgical History:  Procedure Laterality Date   BILATERAL ANTERIOR TOTAL HIP ARTHROPLASTY Bilateral 03/25/2017   Procedure: BILATERAL ANTERIOR TOTAL HIP ARTHROPLASTY;  Surgeon: Leandrew Koyanagi, MD;  Location: [facility];  Service: Orthopedics;  Laterality: Bilateral;   DENTAL SURGERY     all teeth extracted   INTRAVASCULAR ULTRASOUND/IVUS N/A 06/09/2019   Procedure: INTRAVASCULAR ULTRASOUND/IVUS;  Surgeon: Elam Dutch, MD;  Location: [facility];  Service: Cardiovascular;  Laterality: N/A;   LAMINECTOMY AND MICRODISCECTOMY LUMBAR SPINE   06/27/2010   ORIF ANKLE FRACTURE Left 03/17/2019   Procedure: OPEN REDUCTION INTERNAL FIXATION (ORIF) LEFT ANKLE FRACTURE;  Surgeon: Leandrew Koyanagi, MD;  Location: [facility];  Service: Orthopedics;  Laterality: Left;   PERIPHERAL VASCULAR INTERVENTION Left 06/09/2019   Procedure: PERIPHERAL VASCULAR INTERVENTION;  Surgeon: Elam Dutch, MD;  Location: [facility];  Service: Cardiovascular;  Laterality: Left;  Common Iliac vein   PERIPHERAL VASCULAR THROMBECTOMY Left 06/09/2019   Procedure: PERIPHERAL VASCULAR THROMBECTOMY;  Surgeon: Elam Dutch, MD;  Location: [facility];  Service: Cardiovascular;  Laterality: Left;   TUBAL LIGATION Bilateral     Social History   Socioeconomic History   Marital status: Married    Spouse name: Not  on file   Number of children: Not on file   Years of education: Not on file   Highest education level: Not on file  Occupational History   Not on file  Tobacco Use   Smoking status: Former    Packs/day: 0.25    Types: Cigarettes   Smokeless tobacco: Never  Vaping Use   Vaping Use: Never used  Substance and Sexual Activity   Alcohol use: No   Drug use: No   Sexual activity: Yes    Birth control/protection: Surgical    Comment: BTL  Other Topics Concern   Not on file  Social History Narrative   Not on file   Social Determinants of Health   Financial Resource Strain: Not on file  Food Insecurity: Not on file  Transportation Needs: Not on file  Physical Activity: Not on file  Stress: Not on file  Social Connections: Not on  file  Intimate Partner Violence: Not on file    Family History  Problem Relation Age of Onset   Stomach cancer Mother    HIV Mother    Cancer Mother    Colon cancer Sister 8   Breast cancer Neg Hx    Rectal cancer Neg Hx      Review of Systems  Constitutional: Negative.  Negative for chills and fever.  HENT: Negative.  Negative for congestion and sore throat.   Respiratory: Negative.  Negative for cough and shortness of breath.   Cardiovascular: Negative.  Negative for chest pain and palpitations.  Gastrointestinal:  Negative for abdominal pain, nausea and vomiting.  Genitourinary: Negative.   Skin: Negative.  Negative for rash.  Neurological: Negative.  Negative for dizziness and headaches.  All other systems reviewed and are negative.  Today's Vitals   08/18/22 1426  BP: 126/88  Pulse: 72  Temp: 98.1 F (36.7 C)  TempSrc: Oral  SpO2: 96%  Weight: 211 lb 4 oz (95.8 kg)  Height: 5' 1.5" (1.562 m)   Body mass index is 39.27 kg/m.   Physical Exam Vitals reviewed.  Constitutional:      Appearance: Normal appearance. She is obese.  HENT:     Head: Normocephalic.  Eyes:     Extraocular Movements: Extraocular movements intact.      Pupils: Pupils are equal, round, and reactive to light.  Cardiovascular:     Rate and Rhythm: Normal rate and regular rhythm.     Pulses: Normal pulses.     Heart sounds: Normal heart sounds.  Pulmonary:     Effort: Pulmonary effort is normal.     Breath sounds: Normal breath sounds.  Musculoskeletal:     Cervical back: No tenderness.     Right lower leg: No edema.     Left lower leg: No edema.     Comments: Right hand: Mallet deformity fourth finger Rest of hand within normal limits. Left hand normal  Lymphadenopathy:     Cervical: No cervical adenopathy.  Skin:    General: Skin is warm and dry.  Neurological:     General: No focal deficit present.     Mental Status: She is alert and oriented to person, place, and time.  Psychiatric:        Mood and Affect: Mood normal.        Behavior: Behavior normal.      ASSESSMENT & PLAN: A total of 43 minutes was spent with the patient and counseling/coordination of care regarding preparing for this visit, review of most recent office visit notes, review of multiple chronic medical conditions and their management, review of all medications, review of most recent blood work results, education on nutrition, prognosis, documentation, need for follow-up.  Problem List Items Addressed This Visit       Cardiovascular and Mediastinum   Hypertension - Primary    Well-controlled hypertension. Continue losartan 50 mg daily and amlodipine 10 mg daily BP Readings from Last 3 Encounters:  08/18/22 126/88  07/29/22 127/84  05/12/22 117/72  Cardiovascular risks associated with hypertension discussed. Dietary approaches to stop hypertension discussed.         Musculoskeletal and Integument   Mallet finger of right hand    Secondary to old injury.  Needs evaluation by hand surgeon. Referral placed today.      Relevant Orders   Ambulatory referral to Hand Surgery     Other   Bipolar 1 disorder, depressed ([place])  Stable.   Continue Effexor 37.5 mg daily and duloxetine 30 mg daily.      Dyslipidemia    Stable.  Diet and nutrition discussed. Advised to decrease amount of daily carbohydrate intake and daily calories and increase amount of plant-based protein in her diet. Continue atorvastatin 10 mg daily.      Chronic pain syndrome    Stable and well-controlled.      Relevant Medications   cyclobenzaprine (FLEXERIL) 5 MG tablet   lidocaine (XYLOCAINE) 5 % ointment   Other Visit Diagnoses     Need for vaccination       Relevant Orders   Flu Vaccine QUAD 6+ mos PF IM (Fluarix Quad PF) (Completed)   Zoster Recombinant (Shingrix ) (Completed)      Patient Instructions  Health Maintenance, Female Adopting a healthy lifestyle and getting preventive care are important in promoting health and wellness. Ask your health care provider about: The right schedule for you to have regular tests and exams. Things you can do on your own to prevent diseases and keep yourself healthy. What should I know about diet, weight, and exercise? Eat a healthy diet  Eat a diet that includes plenty of vegetables, fruits, low-fat dairy products, and lean protein. Do not eat a lot of foods that are high in solid fats, added sugars, or sodium. Maintain a healthy weight Body mass index (BMI) is used to identify weight problems. It estimates body fat based on height and weight. Your health care provider can help determine your BMI and help you achieve or maintain a healthy weight. Get regular exercise Get regular exercise. This is one of the most important things you can do for your health. Most adults should: Exercise for at least 150 minutes each week. The exercise should increase your heart rate and make you sweat (moderate-intensity exercise). Do strengthening exercises at least twice a week. This is in addition to the moderate-intensity exercise. Spend less time sitting. Even light physical activity can be beneficial. Watch  cholesterol and blood lipids Have your blood tested for lipids and cholesterol at 52 years of age, then have this test every 5 years. Have your cholesterol levels checked more often if: Your lipid or cholesterol levels are high. You are older than 52 years of age. You are at high risk for heart disease. What should I know about cancer screening? Depending on your health history and family history, you may need to have cancer screening at various ages. This may include screening for: Breast cancer. Cervical cancer. Colorectal cancer. Skin cancer. Lung cancer. What should I know about heart disease, diabetes, and high blood pressure? Blood pressure and heart disease High blood pressure causes heart disease and increases the risk of stroke. This is more likely to develop in people who have high blood pressure readings or are overweight. Have your blood pressure checked: Every 3-5 years if you are 28-86 years of age. Every year if you are 107 years old or older. Diabetes Have regular diabetes screenings. This checks your fasting blood sugar level. Have the screening done: Once every three years after age 14 if you are at a normal weight and have a low risk for diabetes. More often and at a younger age if you are overweight or have a high risk for diabetes. What should I know about preventing infection? Hepatitis B If you have a higher risk for hepatitis B, you should be screened for this virus. Talk with your health care provider to find out  if you are at risk for hepatitis B infection. Hepatitis C Testing is recommended for: Everyone born from [birth year] through 1965. Anyone with known risk factors for hepatitis C. Sexually transmitted infections (STIs) Get screened for STIs, including gonorrhea and chlamydia, if: You are sexually active and are younger than 52 years of age. You are older than 52 years of age and your health care provider tells you that you are at risk for this type of  infection. Your sexual activity has changed since you were last screened, and you are at increased risk for chlamydia or gonorrhea. Ask your health care provider if you are at risk. Ask your health care provider about whether you are at high risk for HIV. Your health care provider may recommend a prescription medicine to help prevent HIV infection. If you choose to take medicine to prevent HIV, you should first get tested for HIV. You should then be tested every 3 months for as long as you are taking the medicine. Pregnancy If you are about to stop having your period (premenopausal) and you may become pregnant, seek counseling before you get pregnant. Take 400 to 800 micrograms (mcg) of folic acid every day if you become pregnant. Ask for birth control (contraception) if you want to prevent pregnancy. Osteoporosis and menopause Osteoporosis is a disease in which the bones lose minerals and strength with aging. This can result in bone fractures. If you are 63 years old or older, or if you are at risk for osteoporosis and fractures, ask your health care provider if you should: Be screened for bone loss. Take a calcium or vitamin D supplement to lower your risk of fractures. Be given hormone replacement therapy (HRT) to treat symptoms of menopause. Follow these instructions at home: Alcohol use Do not drink alcohol if: Your health care provider tells you not to drink. You are pregnant, may be pregnant, or are planning to become pregnant. If you drink alcohol: Limit how much you have to: 0-1 drink a day. Know how much alcohol is in your drink. In the [place] one drink equals one 12 oz bottle of beer (355 mL), one 5 oz glass of wine (148 mL), or one 1 oz glass of hard liquor (44 mL). Lifestyle Do not use any products that contain nicotine or tobacco. These products include cigarettes, chewing tobacco, and vaping devices, such as e-cigarettes. If you need help quitting, ask your health care  provider. Do not use street drugs. Do not share needles. Ask your health care provider for help if you need support or information about quitting drugs. General instructions Schedule regular health, dental, and eye exams. Stay current with your vaccines. Tell your health care provider if: You often feel depressed. You have ever been abused or do not feel safe at home. Summary Adopting a healthy lifestyle and getting preventive care are important in promoting health and wellness. Follow your health care provider's instructions about healthy diet, exercising, and getting tested or screened for diseases. Follow your health care provider's instructions on monitoring your cholesterol and blood pressure. This information is not intended to replace advice given to you by your health care provider. Make sure you discuss any questions you have with your health care provider. Document Revised: 12/23/2020 Document Reviewed: 12/23/2020 Elsevier Patient Education  Mar-Mac, MD [facility]

## 2022-08-18 NOTE — Assessment & Plan Note (Signed)
Well-controlled hypertension. Continue losartan 50 mg daily and amlodipine 10 mg daily BP Readings from Last 3 Encounters:  08/18/22 126/88  07/29/22 127/84  05/12/22 117/72  Cardiovascular risks associated with hypertension discussed. Dietary approaches to stop hypertension discussed.

## 2022-08-18 NOTE — Assessment & Plan Note (Signed)
Stable and well controlled. 

## 2022-08-21 ENCOUNTER — Ambulatory Visit (INDEPENDENT_AMBULATORY_CARE_PROVIDER_SITE_OTHER): Payer: Medicare Other | Admitting: Orthopaedic Surgery

## 2022-08-21 ENCOUNTER — Encounter: Payer: Self-pay | Admitting: Orthopaedic Surgery

## 2022-08-21 DIAGNOSIS — G5601 Carpal tunnel syndrome, right upper limb: Secondary | ICD-10-CM

## 2022-08-21 DIAGNOSIS — M65331 Trigger finger, right middle finger: Secondary | ICD-10-CM | POA: Diagnosis not present

## 2022-08-21 DIAGNOSIS — M20011 Mallet finger of right finger(s): Secondary | ICD-10-CM | POA: Diagnosis not present

## 2022-08-21 MED ORDER — BUPIVACAINE HCL 0.5 % IJ SOLN
0.3300 mL | INTRAMUSCULAR | Status: AC | PRN
  Administered 2022-08-21: .33 mL

## 2022-08-21 MED ORDER — LIDOCAINE HCL 1 % IJ SOLN
0.3000 mL | INTRAMUSCULAR | Status: AC | PRN
  Administered 2022-08-21: .3 mL

## 2022-08-21 MED ORDER — METHYLPREDNISOLONE ACETATE 40 MG/ML IJ SUSP
13.3300 mg | INTRAMUSCULAR | Status: AC | PRN
  Administered 2022-08-21: 13.33 mg

## 2022-08-21 NOTE — Progress Notes (Signed)
Office Visit Note   Patient: Patricia Davila           Date of Birth: 21-Apr-1971           MRN: [id number] Visit Date: 08/21/2022              Requested by: Horald Pollen, [place],  [place] [id number] PCP: Horald Pollen, MD   Assessment & Plan: Visit Diagnoses:  1. Right carpal tunnel syndrome   2. Trigger middle finger of right hand   3. Mallet finger of right hand     Plan: In regards to the chronic mallet deformity of ring finger there is nothing to do about it at this point.  She is not symptomatic other than the extensor lag which does not affect her function.  We will provide her with nighttime brace and order nerve conduction studies.  Long trigger finger injection performed today.  Follow-up after the nerve conduction studies.  Follow-Up Instructions: No follow-ups on file.   Orders:  No orders of the defined types were placed in this encounter.  No orders of the defined types were placed in this encounter.     Procedures: Hand/UE Inj: R long A1 for trigger finger on 08/21/2022 9:24 AM Indications: pain Details: 25 G needle Medications: 0.3 mL lidocaine 1 %; 0.33 mL bupivacaine 0.5 %; 13.33 mg methylPREDNISolone acetate 40 MG/ML Outcome: tolerated well, no immediate complications Consent was given by the patient. Patient was prepped and draped in the usual sterile fashion.       Clinical Data: No additional findings.   Subjective: Chief Complaint  Patient presents with   Right Hand - Pain    HPI Patricia Davila is a 52 year old female well-known to me who comes in for evaluation of couple of problems with her right hand.  She is experiencing coldness and numbness and nighttime symptoms in her right hand.  She is constantly having to shake out the hand.  Denies any injuries.  Also feels pain radiating down the long finger and describes locking.  She also has a chronic mallet finger to the right ring finger.  Review of Systems   Constitutional: Negative.   HENT: Negative.    Eyes: Negative.   Respiratory: Negative.    Cardiovascular: Negative.   Endocrine: Negative.   Musculoskeletal: Negative.   Neurological: Negative.   Hematological: Negative.   Psychiatric/Behavioral: Negative.    All other systems reviewed and are negative.    Objective: Vital Signs: There were no vitals taken for this visit.  Physical Exam Vitals and nursing note reviewed.  Constitutional:      Appearance: She is well-developed.  HENT:     Head: Normocephalic and atraumatic.  Pulmonary:     Effort: Pulmonary effort is normal.  Abdominal:     Palpations: Abdomen is soft.  Musculoskeletal:     Cervical back: Neck supple.  Skin:    General: Skin is warm.     Capillary Refill: Capillary refill takes less than 2 seconds.  Neurological:     Mental Status: She is alert and oriented to person, place, and time.  Psychiatric:        Behavior: Behavior normal.        Thought Content: Thought content normal.        Judgment: Judgment normal.     Ortho Exam Examination of the right hand shows positive carpal tunnel compressive signs.  She has tenderness over the long finger A1 pulley.  She is unable to touch the tip of her long finger to the palmar crease.  She has a chronic mallet finger of the ring finger. Specialty Comments:  No specialty comments available.  Imaging: No results found.   PMFS History: Patient Active Problem List   Diagnosis Date Noted   Right carpal tunnel syndrome 08/21/2022   Trigger middle finger of right hand 08/21/2022   Mallet finger of right hand 08/18/2022   Nonintractable chronic migraine 02/12/2022   Dyslipidemia 07/02/2021   Chronic pain syndrome 07/02/2021   History of blood clots 07/02/2021   Erosive osteoarthritis of multiple sites 07/02/2021   Primary osteoarthritis of right hip 04/08/2017   History of hip replacement 03/25/2017   Primary osteoarthritis of left hip 12/22/2016    Major depressive disorder 10/24/2012   Headache(784.0) 05/13/2010   Microalbuminuria 04/16/2009   Left hip pain 11/22/2008   Bipolar 1 disorder, depressed ([place]) 05/17/2007   Lumbosacral degenerative disk disease 03/23/2005   Hypertension 08/18/2003   Past Medical History:  Diagnosis Date   Anemia    low iron during pregnancy   Anxiety    Arthritis    Asthma    as a child   Bipolar disorder ([place])    Depression    GERD (gastroesophageal reflux disease)    Headache    Hypertension    Left trimalleolar fracture    Pneumonia    as a child   PONV (postoperative nausea and vomiting)    Restless legs     Family History  Problem Relation Age of Onset   Stomach cancer Mother    HIV Mother    Cancer Mother    Colon cancer Sister 82   Breast cancer Neg Hx    Rectal cancer Neg Hx     Past Surgical History:  Procedure Laterality Date   BILATERAL ANTERIOR TOTAL HIP ARTHROPLASTY Bilateral 03/25/2017   Procedure: BILATERAL ANTERIOR TOTAL HIP ARTHROPLASTY;  Surgeon: Leandrew Koyanagi, MD;  Location: [facility];  Service: Orthopedics;  Laterality: Bilateral;   DENTAL SURGERY     all teeth extracted   INTRAVASCULAR ULTRASOUND/IVUS N/A 06/09/2019   Procedure: INTRAVASCULAR ULTRASOUND/IVUS;  Surgeon: Elam Dutch, MD;  Location: [facility];  Service: Cardiovascular;  Laterality: N/A;   LAMINECTOMY AND MICRODISCECTOMY LUMBAR SPINE   06/27/2010   ORIF ANKLE FRACTURE Left 03/17/2019   Procedure: OPEN REDUCTION INTERNAL FIXATION (ORIF) LEFT ANKLE FRACTURE;  Surgeon: Leandrew Koyanagi, MD;  Location: [facility];  Service: Orthopedics;  Laterality: Left;   PERIPHERAL VASCULAR INTERVENTION Left 06/09/2019   Procedure: PERIPHERAL VASCULAR INTERVENTION;  Surgeon: Elam Dutch, MD;  Location: [facility];  Service: Cardiovascular;  Laterality: Left;  Common Iliac vein   PERIPHERAL VASCULAR THROMBECTOMY Left 06/09/2019   Procedure: PERIPHERAL VASCULAR THROMBECTOMY;  Surgeon:  Elam Dutch, MD;  Location: [facility];  Service: Cardiovascular;  Laterality: Left;   TUBAL LIGATION Bilateral    Social History   Occupational History   Not on file  Tobacco Use   Smoking status: Former    Packs/day: 0.25    Types: Cigarettes   Smokeless tobacco: Never  Vaping Use   Vaping Use: Never used  Substance and Sexual Activity   Alcohol use: No   Drug use: No   Sexual activity: Yes    Birth control/protection: Surgical    Comment: BTL

## 2022-08-21 NOTE — Addendum Note (Signed)
Addended by: Lendon Collar on: 08/21/2022 09:29 AM   Modules accepted: Orders

## 2022-08-25 ENCOUNTER — Other Ambulatory Visit: Payer: Self-pay | Admitting: Emergency Medicine

## 2022-08-25 DIAGNOSIS — Z760 Encounter for issue of repeat prescription: Secondary | ICD-10-CM

## 2022-08-25 DIAGNOSIS — I10 Essential (primary) hypertension: Secondary | ICD-10-CM

## 2022-08-26 ENCOUNTER — Other Ambulatory Visit: Payer: Self-pay | Admitting: Emergency Medicine

## 2022-08-26 ENCOUNTER — Encounter: Payer: Self-pay | Admitting: Gastroenterology

## 2022-08-26 ENCOUNTER — Encounter: Payer: Self-pay | Admitting: Emergency Medicine

## 2022-08-26 DIAGNOSIS — Z1231 Encounter for screening mammogram for malignant neoplasm of breast: Secondary | ICD-10-CM

## 2022-08-26 MED ORDER — HYDROCHLOROTHIAZIDE 12.5 MG PO TABS: 12.5000 mg | ORAL_TABLET | Freq: Every day | ORAL | 3 refills | Status: DC

## 2022-08-26 NOTE — Telephone Encounter (Signed)
New prescription for hydrochlorothiazide sent to pharmacy of record

## 2022-08-27 ENCOUNTER — Encounter: Payer: Self-pay | Admitting: Emergency Medicine

## 2022-08-27 ENCOUNTER — Other Ambulatory Visit: Payer: Self-pay | Admitting: Emergency Medicine

## 2022-08-27 ENCOUNTER — Telehealth: Payer: Self-pay | Admitting: Gastroenterology

## 2022-08-27 DIAGNOSIS — Z86718 Personal history of other venous thrombosis and embolism: Secondary | ICD-10-CM

## 2022-08-27 NOTE — Telephone Encounter (Signed)
Msg was email back to pt w/MD response.Marland KitchenJohny Davila

## 2022-08-27 NOTE — Telephone Encounter (Signed)
Order for ultrasound left lower extremity placed today.  Thanks.

## 2022-08-27 NOTE — Telephone Encounter (Signed)
Patient is returning your call states she does not need an appointment here.

## 2022-08-28 ENCOUNTER — Ambulatory Visit (HOSPITAL_COMMUNITY)
Admission: RE | Admit: 2022-08-28 | Discharge: 2022-08-28 | Disposition: A | Discharge status: Home / Self Care | Payer: Medicare Other | Source: Ambulatory Visit | Attending: Emergency Medicine | Admitting: Emergency Medicine

## 2022-08-28 DIAGNOSIS — Z86718 Personal history of other venous thrombosis and embolism: Secondary | ICD-10-CM

## 2022-08-29 ENCOUNTER — Other Ambulatory Visit: Payer: Self-pay | Admitting: Emergency Medicine

## 2022-08-29 DIAGNOSIS — I82512 Chronic embolism and thrombosis of left femoral vein: Secondary | ICD-10-CM

## 2022-09-04 ENCOUNTER — Encounter: Payer: Self-pay | Admitting: Orthopaedic Surgery

## 2022-09-04 ENCOUNTER — Ambulatory Visit (INDEPENDENT_AMBULATORY_CARE_PROVIDER_SITE_OTHER): Payer: 59

## 2022-09-04 ENCOUNTER — Ambulatory Visit (INDEPENDENT_AMBULATORY_CARE_PROVIDER_SITE_OTHER): Payer: 59 | Admitting: Orthopaedic Surgery

## 2022-09-04 VITALS — BP 117/77 | HR 74 | Ht 61.5 in | Wt 211.0 lb

## 2022-09-04 DIAGNOSIS — G8929 Other chronic pain: Secondary | ICD-10-CM

## 2022-09-04 DIAGNOSIS — M5441 Lumbago with sciatica, right side: Secondary | ICD-10-CM

## 2022-09-04 DIAGNOSIS — M5442 Lumbago with sciatica, left side: Secondary | ICD-10-CM

## 2022-09-04 NOTE — Progress Notes (Signed)
Office Visit Note   Patient: Patricia Davila           Date of Birth: 1971-07-08           MRN: [id number] Visit Date: 09/04/2022              Requested by: Horald Pollen, [employer],  [place] [address] PCP: Horald Pollen, MD   Assessment & Plan: Visit Diagnoses:  1. Chronic bilateral low back pain with bilateral sciatica     Plan: Pack system.  That Dr. Sherley Bounds did her surgery back in 2011 at L4-5 from spot portable images in the OR.  Will set up for some physical therapy for her back.  Follow-Up Instructions: Return in about 7 weeks (around 10/23/2022).   Orders:  Orders Placed This Encounter  Procedures   XR Lumbar Spine 2-3 Views   Ambulatory referral to Physical Therapy   No orders of the defined types were placed in this encounter.     Procedures: No procedures performed   Clinical Data: No additional findings.   Subjective: Chief Complaint  Patient presents with   Lower Back - Pain    HPI 52 year old female states she had surgery back in 2011 she thinks may be Dr. Donavan Burnet but did not do therapy afterwards.  She states she has had pain with some numbness in both legs.  She had a fall in 2021 with ankle fracture surgery by Dr. Sherrian Divers.  She is also had hip arthroplasties bilateral in 2018.  She has been taking Flexeril she complains of back pain and cramps into her legs.  Previous L4-5 surgery with lumbar disc degeneration.  Patient has had trouble with trigger finger in the past also carpal tunnel syndrome.  Injection trigger finger by Dr. Sherrian Divers not currently triggering.  Review of Systems no fever chills no bowel or bladder associated symptoms.   Objective: Vital Signs: BP 117/77   Pulse 74   Ht 5' 1.5" (1.562 m)   Wt 211 lb (95.7 kg)   BMI 39.22 kg/m   Physical Exam Constitutional:      Appearance: She is well-developed.  HENT:     Head: Normocephalic.     Right Ear: External ear normal.     Left Ear: External ear  normal. There is no impacted cerumen.  Eyes:     Pupils: Pupils are equal, round, and reactive to light.  Neck:     Thyroid: No thyromegaly.     Trachea: No tracheal deviation.  Cardiovascular:     Rate and Rhythm: Normal rate.  Pulmonary:     Effort: Pulmonary effort is normal.  Abdominal:     Palpations: Abdomen is soft.  Musculoskeletal:     Cervical back: No rigidity.  Skin:    General: Skin is warm and dry.  Neurological:     Mental Status: She is alert and oriented to person, place, and time.  Psychiatric:        Behavior: Behavior normal.     Ortho Exam patient has sciatic notch tenderness right and left some pain with straight leg raising at 90 degrees anterior tib EHL gastrocsoleus is strong and symmetrical.  Well-healed lumbar incision.  Specialty Comments:  No specialty comments available.  Imaging: No results found.   PMFS History: Patient Active Problem List   Diagnosis Date Noted   Right carpal tunnel syndrome 08/21/2022   Trigger middle finger of right hand 08/21/2022   Mallet finger of  right hand 08/18/2022   Nonintractable chronic migraine 02/12/2022   Dyslipidemia 07/02/2021   Chronic pain syndrome 07/02/2021   History of blood clots 07/02/2021   Erosive osteoarthritis of multiple sites 07/02/2021   Primary osteoarthritis of right hip 04/08/2017   History of hip replacement 03/25/2017   Primary osteoarthritis of left hip 12/22/2016   Major depressive disorder 10/24/2012   Headache(784.0) 05/13/2010   Microalbuminuria 04/16/2009   Left hip pain 11/22/2008   Bipolar 1 disorder, depressed ([place]) 05/17/2007   Lumbosacral degenerative disk disease 03/23/2005   Hypertension 08/18/2003   Past Medical History:  Diagnosis Date   Anemia    low iron during pregnancy   Anxiety    Arthritis    Asthma    as a child   Bipolar disorder ([place])    Depression    GERD (gastroesophageal reflux disease)    Headache    Hypertension    Left trimalleolar  fracture    Pneumonia    as a child   PONV (postoperative nausea and vomiting)    Restless legs     Family History  Problem Relation Age of Onset   Stomach cancer Mother    HIV Mother    Cancer Mother    Colon cancer Sister 98   Breast cancer Neg Hx    Rectal cancer Neg Hx     Past Surgical History:  Procedure Laterality Date   BILATERAL ANTERIOR TOTAL HIP ARTHROPLASTY Bilateral 03/25/2017   Procedure: BILATERAL ANTERIOR TOTAL HIP ARTHROPLASTY;  Surgeon: Leandrew Koyanagi, MD;  Location: [facility];  Service: Orthopedics;  Laterality: Bilateral;   DENTAL SURGERY     all teeth extracted   INTRAVASCULAR ULTRASOUND/IVUS N/A 06/09/2019   Procedure: INTRAVASCULAR ULTRASOUND/IVUS;  Surgeon: Elam Dutch, MD;  Location: [facility];  Service: Cardiovascular;  Laterality: N/A;   LAMINECTOMY AND MICRODISCECTOMY LUMBAR SPINE   06/27/2010   ORIF ANKLE FRACTURE Left 03/17/2019   Procedure: OPEN REDUCTION INTERNAL FIXATION (ORIF) LEFT ANKLE FRACTURE;  Surgeon: Leandrew Koyanagi, MD;  Location: [facility];  Service: Orthopedics;  Laterality: Left;   PERIPHERAL VASCULAR INTERVENTION Left 06/09/2019   Procedure: PERIPHERAL VASCULAR INTERVENTION;  Surgeon: Elam Dutch, MD;  Location: [facility];  Service: Cardiovascular;  Laterality: Left;  Common Iliac vein   PERIPHERAL VASCULAR THROMBECTOMY Left 06/09/2019   Procedure: PERIPHERAL VASCULAR THROMBECTOMY;  Surgeon: Elam Dutch, MD;  Location: [facility];  Service: Cardiovascular;  Laterality: Left;   TUBAL LIGATION Bilateral    Social History   Occupational History   Not on file  Tobacco Use   Smoking status: Former    Packs/day: 0.25    Types: Cigarettes   Smokeless tobacco: Never  Vaping Use   Vaping Use: Never used  Substance and Sexual Activity   Alcohol use: No   Drug use: No   Sexual activity: Yes    Birth control/protection: Surgical    Comment: BTL

## 2022-09-14 ENCOUNTER — Other Ambulatory Visit: Payer: Self-pay | Admitting: *Deleted

## 2022-09-14 DIAGNOSIS — I871 Compression of vein: Secondary | ICD-10-CM

## 2022-09-16 ENCOUNTER — Ambulatory Visit (INDEPENDENT_AMBULATORY_CARE_PROVIDER_SITE_OTHER): Payer: 59 | Admitting: Physical Medicine and Rehabilitation

## 2022-09-16 DIAGNOSIS — R531 Weakness: Secondary | ICD-10-CM | POA: Diagnosis not present

## 2022-09-16 DIAGNOSIS — M25511 Pain in right shoulder: Secondary | ICD-10-CM | POA: Diagnosis not present

## 2022-09-16 DIAGNOSIS — M79641 Pain in right hand: Secondary | ICD-10-CM

## 2022-09-16 DIAGNOSIS — M79601 Pain in right arm: Secondary | ICD-10-CM | POA: Diagnosis not present

## 2022-09-16 DIAGNOSIS — G8929 Other chronic pain: Secondary | ICD-10-CM | POA: Diagnosis not present

## 2022-09-16 DIAGNOSIS — R202 Paresthesia of skin: Secondary | ICD-10-CM

## 2022-09-16 NOTE — Procedures (Signed)
EMG & NCV Findings: Evaluation of the right median motor nerve showed prolonged distal onset latency (4.4 ms), reduced amplitude (4.5 mV), and decreased conduction velocity (Elbow-Wrist, 49 m/s).  The left median (across palm) sensory nerve showed no response (Palm).  The right median (across palm) sensory nerve showed prolonged distal peak latency (Wrist, 4.6 ms) and prolonged distal peak latency (Palm, 3.0 ms).  All remaining nerves (as indicated in the following tables) were within normal limits.  Left vs. Right side comparison data for the median motor nerve indicates abnormal L-R latency difference (0.9 ms).    All examined muscles (as indicated in the following table) showed no evidence of electrical instability.    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 There is no significant electrodiagnostic evidence of any other focal nerve entrapment, brachial plexopathy or cervical radiculopathy.   Recommendations: 1.  Follow-up with referring physician. 2.  Continue current management of symptoms. 3.  Continue use of resting splint at night-time and as needed during the day. 4.  Suggest surgical evaluation.  ___________________________ Laurence Spates FAAPMR Board Certified, American Board of Physical Medicine and Rehabilitation    Nerve Conduction Studies Anti Sensory Summary Table   Stim Site NR Peak (ms) Norm Peak (ms) P-T Amp (V) Norm P-T Amp Site1 Site2 Delta-P (ms) Dist (cm) Vel (m/s) Norm Vel (m/s)  Left Median Acr Palm Anti Sensory (2nd Digit)  31.5C  Wrist    3.6 <3.6 23.3 >10 Wrist Palm  0.0    Palm *NR  <2.0          Right Median Acr Palm Anti Sensory (2nd Digit)  31.3C  Wrist    *4.6 <3.6 11.5 >10 Wrist Palm 1.6 0.0    Palm    *3.0 <2.0 3.3         Right Radial Anti Sensory (Base 1st Digit)  31.9C  Wrist    2.1 <3.1  41.0  Wrist Base 1st Digit 2.1 0.0    Right Ulnar Anti Sensory (5th Digit)  32C  Wrist    3.2 <3.7 20.3 >15.0 Wrist 5th Digit 3.2 14.0 44 >38   Motor Summary Table   Stim Site NR Onset (ms) Norm Onset (ms) O-P Amp (mV) Norm O-P Amp Site1 Site2 Delta-0 (ms) Dist (cm) Vel (m/s) Norm Vel (m/s)  Left Median Motor (Abd Poll Brev)  31.2C  Wrist    3.5 <4.2 6.0 >5 Elbow Wrist 3.5 18.0 51 >50  Elbow    7.0  4.2         Right Median Motor (Abd Poll Brev)  32C  Wrist    *4.4 <4.2 *4.5 >5 Elbow Wrist 3.9 19.0 *49 >50  Elbow    8.3  3.4         Right Ulnar Motor (Abd Dig Min)  32.1C  Wrist    3.0 <4.2 6.2 >3 B Elbow Wrist 2.9 18.5 64 >53  B Elbow    5.9  3.6  A Elbow B Elbow 1.3 10.0 77 >53  A Elbow    7.2  6.3          EMG   Side Muscle Nerve Root Ins Act Fibs Psw Amp Dur Poly Recrt Int Fraser Din Comment  Right Abd Poll Brev Median C8-T1 Nml Nml Nml Nml Nml 0 Nml Nml   Right 1stDorInt Ulnar C8-T1 Nml Nml Nml Nml Nml 0  Nml Nml   Right PronatorTeres Median C6-7 Nml Nml Nml Nml Nml 0 Nml Nml   Right Biceps Musculocut C5-6 Nml Nml Nml Nml Nml 0 Nml Nml   Right Deltoid Axillary C5-6 Nml Nml Nml Nml Nml 0 Nml Nml     Nerve Conduction Studies Anti Sensory Left/Right Comparison   Stim Site L Lat (ms) R Lat (ms) L-R Lat (ms) L Amp (V) R Amp (V) L-R Amp (%) Site1 Site2 L Vel (m/s) R Vel (m/s) L-R Vel (m/s)  Median Acr Palm Anti Sensory (2nd Digit)  31.5C  Wrist 3.6 *4.6 1.0 23.3 11.5 50.6 Wrist Palm     Palm  *3.0   3.3        Radial Anti Sensory (Base 1st Digit)  31.9C  Wrist  2.1   41.0  Wrist Base 1st Digit     Ulnar Anti Sensory (5th Digit)  32C  Wrist  3.2   20.3  Wrist 5th Digit  44    Motor Left/Right Comparison   Stim Site L Lat (ms) R Lat (ms) L-R Lat (ms) L Amp (mV) R Amp (mV) L-R Amp (%) Site1 Site2 L Vel (m/s) R Vel (m/s) L-R Vel (m/s)  Median Motor (Abd Poll Brev)  31.2C  Wrist 3.5 *4.4 *0.9 6.0 *4.5 25.0 Elbow Wrist 51 *49 2  Elbow 7.0 8.3 1.3 4.2 3.4 19.0       Ulnar Motor  (Abd Dig Min)  32.1C  Wrist  3.0   6.2  B Elbow Wrist  64   B Elbow  5.9   3.6  A Elbow B Elbow  77   A Elbow  7.2   6.3           Waveforms:

## 2022-09-16 NOTE — Progress Notes (Signed)
Functional Pain Scale - descriptive words and definitions  Unmanageable (7)  Pain interferes with normal ADL's/nothing seems to help/sleep is very difficult/active distractions are very difficult to concentrate on. Severe range order  Average Pain 7  Right handed. Pain and numbness in right hand and arm. Trigger finger in middle finger on right hand. Hard to grasp things

## 2022-09-21 ENCOUNTER — Ambulatory Visit: Payer: 59 | Admitting: Physical Therapy

## 2022-09-21 NOTE — Therapy (Incomplete)
OUTPATIENT PHYSICAL THERAPY EVALUATION   Patient Name: Patricia Davila MRN: [id number] DOB:[birth year], 52 y.o., female Today's Date: 09/21/2022   END OF SESSION:   Past Medical History:  Diagnosis Date   Anemia    low iron during pregnancy   Anxiety    Arthritis    Asthma    as a child   Bipolar disorder ([place])    Depression    GERD (gastroesophageal reflux disease)    Headache    Hypertension    Left trimalleolar fracture    Pneumonia    as a child   PONV (postoperative nausea and vomiting)    Restless legs    Past Surgical History:  Procedure Laterality Date   BILATERAL ANTERIOR TOTAL HIP ARTHROPLASTY Bilateral 03/25/2017   Procedure: BILATERAL ANTERIOR TOTAL HIP ARTHROPLASTY;  Surgeon: Leandrew Koyanagi, MD;  Location: [facility];  Service: Orthopedics;  Laterality: Bilateral;   DENTAL SURGERY     all teeth extracted   INTRAVASCULAR ULTRASOUND/IVUS N/A 06/09/2019   Procedure: INTRAVASCULAR ULTRASOUND/IVUS;  Surgeon: Elam Dutch, MD;  Location: [facility];  Service: Cardiovascular;  Laterality: N/A;   LAMINECTOMY AND MICRODISCECTOMY LUMBAR SPINE   06/27/2010   ORIF ANKLE FRACTURE Left 03/17/2019   Procedure: OPEN REDUCTION INTERNAL FIXATION (ORIF) LEFT ANKLE FRACTURE;  Surgeon: Leandrew Koyanagi, MD;  Location: [facility];  Service: Orthopedics;  Laterality: Left;   PERIPHERAL VASCULAR INTERVENTION Left 06/09/2019   Procedure: PERIPHERAL VASCULAR INTERVENTION;  Surgeon: Elam Dutch, MD;  Location: [facility];  Service: Cardiovascular;  Laterality: Left;  Common Iliac vein   PERIPHERAL VASCULAR THROMBECTOMY Left 06/09/2019   Procedure: PERIPHERAL VASCULAR THROMBECTOMY;  Surgeon: Elam Dutch, MD;  Location: [facility];  Service: Cardiovascular;  Laterality: Left;   TUBAL LIGATION Bilateral    Patient Active Problem List   Diagnosis Date Noted   Right carpal tunnel syndrome 08/21/2022   Trigger middle finger of right hand 08/21/2022    Mallet finger of right hand 08/18/2022   Nonintractable chronic migraine 02/12/2022   Dyslipidemia 07/02/2021   Chronic pain syndrome 07/02/2021   History of blood clots 07/02/2021   Erosive osteoarthritis of multiple sites 07/02/2021   Primary osteoarthritis of right hip 04/08/2017   History of hip replacement 03/25/2017   Primary osteoarthritis of left hip 12/22/2016   Major depressive disorder 10/24/2012   Headache(784.0) 05/13/2010   Microalbuminuria 04/16/2009   Left hip pain 11/22/2008   Bipolar 1 disorder, depressed ([place]) 05/17/2007   Lumbosacral degenerative disk disease 03/23/2005   Hypertension 08/18/2003    PCP: Horald Pollen, MD  REFERRING PROVIDER: Marybelle Killings, MD  REFERRING DIAG: Chronic bilateral low back pain with bilateral sciatica  Rationale for Evaluation and Treatment: Rehabilitation  THERAPY DIAG:  No diagnosis found.  ONSET DATE: ***   SUBJECTIVE:              SUBJECTIVE STATEMENT: ***  PERTINENT HISTORY:  History of lumbar surgery in 2011, bilateral THA 2018, bipolar, HTN  PAIN:  Are you having pain? Yes:  NPRS scale: ***/10 Pain location: Low back Pain description: *** Aggravating factors: *** Relieving factors: ***  PRECAUTIONS: None  WEIGHT BEARING RESTRICTIONS: No  FALLS:  Has patient fallen in last 6 months? {fallsyesno:27318}  OCCUPATION: ***  PLOF: Independent  PATIENT GOALS: ***   OBJECTIVE:  DIAGNOSTIC FINDINGS:  X-ray Lumbar 09/04/2022: Lateral flexion-extension and AP x-ray lumbar spine demonstrates 80% narrowing of the L4-5 disc space with  endplate sclerosis that appears reactive.  No spondylolisthesis.  Degenerative facet changes slightly worse on the left than right at L4-5.  Previous laminotomy defect noted.   PATIENT SURVEYS:  {rehab surveys:24030}  SCREENING FOR RED FLAGS: Negative  COGNITION: Overall cognitive status: Within functional limits for tasks assessed     SENSATION: WFL  MUSCLE  LENGTH: ***  POSTURE:   ***  PALPATION: ***  LUMBAR ROM:   AROM eval  Flexion   Extension   Right lateral flexion   Left lateral flexion   Right rotation   Left rotation    (Blank rows = not tested)  LOWER EXTREMITY ROM:      ***  LOWER EXTREMITY MMT:    MMT Right eval Left eval  Hip flexion    Hip extension    Hip abduction    Hip adduction    Hip internal rotation    Hip external rotation    Knee flexion    Knee extension    Ankle dorsiflexion    Ankle plantarflexion    Ankle inversion    Ankle eversion     (Blank rows = not tested)  LUMBAR SPECIAL TESTS:  {lumbar special test:25242}  FUNCTIONAL TESTS:  {Functional tests:24029}  GAIT: Distance walked: *** Assistive device utilized: {Assistive devices:23999} Level of assistance: {Levels of assistance:24026} Comments: ***   TODAY'S TREATMENT:          [facility] Adult PT Treatment:                                                DATE: 09/21/2022 Therapeutic Exercise: ***  PATIENT EDUCATION:  Education details: Exam findings, POC, HEP Person educated: Patient Education method: Explanation, Demonstration, Tactile cues, Verbal cues, and Handouts Education comprehension: verbalized understanding, returned demonstration, verbal cues required, tactile cues required, and needs further education  HOME EXERCISE PROGRAM: ***   ASSESSMENT: CLINICAL IMPRESSION: Patient is a 52 y.o. female who was seen today for physical therapy evaluation and treatment for chronic low back pain. ***    OBJECTIVE IMPAIRMENTS: {opptimpairments:25111}.   ACTIVITY LIMITATIONS: {activitylimitations:27494}  PARTICIPATION LIMITATIONS: {participationrestrictions:25113}  PERSONAL FACTORS: {Personal factors:25162} are also affecting patient's functional outcome.   REHAB POTENTIAL: {rehabpotential:25112}  CLINICAL DECISION MAKING: {clinical decision making:25114}  EVALUATION COMPLEXITY: {Evaluation  complexity:25115}   GOALS: Goals reviewed with patient? Yes  SHORT TERM GOALS: Target date: ***  Patient will be I with initial HEP in order to progress with therapy. Baseline: HEP provided at eval Goal status: INITIAL  2.  *** Baseline:  Goal status: INITIAL  3.  *** Baseline:  Goal status: INITIAL  LONG TERM GOALS: Target date: ***  Patient will be I with final HEP to maintain progress from PT. Baseline: HEP provided at eval Goal status: INITIAL  2.  *** Baseline:  Goal status: INITIAL  3.  *** Baseline:  Goal status: INITIAL  4.  *** Baseline:  Goal status: INITIAL   PLAN: PT FREQUENCY: 1x/week  PT DURATION: 8 weeks  PLANNED INTERVENTIONS: {rehab planned interventions:25118::"Therapeutic exercises","Therapeutic activity","Neuromuscular re-education","Balance training","Gait training","Patient/Family education","Self Care","Joint mobilization"}.  PLAN FOR NEXT SESSION: Review HEP and progress PRN, ***   Hilda Blades, PT, DPT, LAT, ATC 09/21/22  8:54 AM Phone: [phone] Fax: [phone]

## 2022-09-23 ENCOUNTER — Ambulatory Visit (HOSPITAL_COMMUNITY)
Admission: RE | Admit: 2022-09-23 | Discharge: 2022-09-23 | Disposition: A | Discharge status: Home / Self Care | Payer: 59 | Source: Ambulatory Visit | Attending: Vascular Surgery | Admitting: Vascular Surgery

## 2022-09-23 DIAGNOSIS — I871 Compression of vein: Secondary | ICD-10-CM | POA: Insufficient documentation

## 2022-09-23 NOTE — Progress Notes (Signed)
Patricia Davila - 52 y.o. female MRN [id number]  Date of birth: 1971-02-23  Office Visit Note: Visit Date: 09/16/2022 PCP: Horald Pollen, MD Referred by: Leandrew Koyanagi, MD  Subjective: Chief Complaint  Patient presents with   Right Hand - Pain, Numbness, Weakness   Right Arm - Numbness, Pain   HPI: Patricia Davila is a 52 y.o. female who comes in today at the request of Dr. Eduard Roux for evaluation and management of chronic, worsening and severe pain, numbness and tingling in the Bilateral upper extremities.  Patient is Right hand dominant.  She complains of several issues on the right arm and hand.  She does get pain and paresthesia in the arm and the hand particularly in the radial digits.  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  She is also having triggering symptoms and trigger finger and this seems to be helped post injection by Dr. Erlinda Hong. She describes constant pain numbness and tingling in the radial digits of the right hand much more than left hand she does get some symptoms on the left.  She has a history of chronic migraine and neck pain.  She has a complicated history of bipolar disorder and chronic pain syndrome.  She has had multiple orthopedic complaints in the past including ankle fracture.  She rates her pain as a 7 out of 10 and interferes with normal activities at this point.  She has tried conservative care with medications and rest without much relief.  She is not diabetic.    Review of Systems  Musculoskeletal:  Positive for back pain, joint pain and neck pain.  Neurological:  Positive for tingling and weakness.  All other systems reviewed and are negative.  Otherwise per HPI.  Assessment & Plan: Visit Diagnoses:    ICD-10-CM   1. Paresthesia of skin  R20.2 NCV with EMG (electromyography)    2. Right arm pain  M79.601     3. Pain in right hand  M79.641     4. Weakness  R53.1     5. Chronic right shoulder pain  M25.511    G89.29         Plan: Clinically the patient appears to have multiple factors in the right arm and hand with differential diagnosis of carpal tunnel syndrome or C6 radiculitis combined with arthritic and mechanical complaints of the hand and arm.  Electrodiagnostic study performed today. 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 There is no significant electrodiagnostic evidence of any other focal nerve entrapment, brachial plexopathy or cervical radiculopathy.   Recommendations: 1.  Follow-up with referring physician. 2.  Continue current management of symptoms. 3.  Continue use of resting splint at night-time and as needed during the day. 4.  Suggest surgical evaluation.  Meds & Orders: No orders of the defined types were placed in this encounter.   Orders Placed This Encounter  Procedures   NCV with EMG (electromyography)    Follow-up: Return for  Eduard Roux, MD.   Procedures: No procedures performed  EMG & NCV Findings: Evaluation of the right median motor nerve showed prolonged distal onset latency (4.4 ms), reduced amplitude (4.5 mV), and decreased conduction velocity (Elbow-Wrist, 49 m/s).  The left median (across palm) sensory nerve showed no response (Palm).  The right median (across palm) sensory nerve showed prolonged distal peak latency (  Wrist, 4.6 ms) and prolonged distal peak latency (Palm, 3.0 ms).  All remaining nerves (as indicated in the following tables) were within normal limits.  Left vs. Right side comparison data for the median motor nerve indicates abnormal L-R latency difference (0.9 ms).    All examined muscles (as indicated in the following table) showed no evidence of electrical instability.    Impression: The above electrodiagnostic study is ABNORMAL and reveals evidence of a severe right median nerve entrapment at the  wrist (carpal tunnel syndrome) affecting sensory and motor components. There is no significant electrodiagnostic evidence of any other focal nerve entrapment, brachial plexopathy or cervical radiculopathy.   Recommendations: 1.  Follow-up with referring physician. 2.  Continue current management of symptoms. 3.  Continue use of resting splint at night-time and as needed during the day. 4.  Suggest surgical evaluation.  ___________________________ Laurence Spates FAAPMR Board Certified, American Board of Physical Medicine and Rehabilitation    Nerve Conduction Studies Anti Sensory Summary Table   Stim Site NR Peak (ms) Norm Peak (ms) P-T Amp (V) Norm P-T Amp Site1 Site2 Delta-P (ms) Dist (cm) Vel (m/s) Norm Vel (m/s)  Left Median Acr Palm Anti Sensory (2nd Digit)  31.5C  Wrist    3.6 <3.6 23.3 >10 Wrist Palm  0.0    Palm *NR  <2.0          Right Median Acr Palm Anti Sensory (2nd Digit)  31.3C  Wrist    *4.6 <3.6 11.5 >10 Wrist Palm 1.6 0.0    Palm    *3.0 <2.0 3.3         Right Radial Anti Sensory (Base 1st Digit)  31.9C  Wrist    2.1 <3.1 41.0  Wrist Base 1st Digit 2.1 0.0    Right Ulnar Anti Sensory (5th Digit)  32C  Wrist    3.2 <3.7 20.3 >15.0 Wrist 5th Digit 3.2 14.0 44 >38   Motor Summary Table   Stim Site NR Onset (ms) Norm Onset (ms) O-P Amp (mV) Norm O-P Amp Site1 Site2 Delta-0 (ms) Dist (cm) Vel (m/s) Norm Vel (m/s)  Left Median Motor (Abd Poll Brev)  31.2C  Wrist    3.5 <4.2 6.0 >5 Elbow Wrist 3.5 18.0 51 >50  Elbow    7.0  4.2         Right Median Motor (Abd Poll Brev)  32C  Wrist    *4.4 <4.2 *4.5 >5 Elbow Wrist 3.9 19.0 *49 >50  Elbow    8.3  3.4         Right Ulnar Motor (Abd Dig Min)  32.1C  Wrist    3.0 <4.2 6.2 >3 B Elbow Wrist 2.9 18.5 64 >53  B Elbow    5.9  3.6  A Elbow B Elbow 1.3 10.0 77 >53  A Elbow    7.2  6.3          EMG   Side Muscle Nerve Root Ins Act Fibs Psw Amp Dur Poly Recrt Int Fraser Din Comment  Right Abd Poll Brev Median C8-T1 Nml Nml Nml  Nml Nml 0 Nml Nml   Right 1stDorInt Ulnar C8-T1 Nml Nml Nml Nml Nml 0 Nml Nml   Right PronatorTeres Median C6-7 Nml Nml Nml Nml Nml 0 Nml Nml   Right Biceps Musculocut C5-6 Nml Nml Nml Nml Nml 0 Nml Nml   Right Deltoid Axillary C5-6 Nml Nml Nml Nml Nml 0 Nml Nml     Nerve Conduction Studies Anti Sensory  Left/Right Comparison   Stim Site L Lat (ms) R Lat (ms) L-R Lat (ms) L Amp (V) R Amp (V) L-R Amp (%) Site1 Site2 L Vel (m/s) R Vel (m/s) L-R Vel (m/s)  Median Acr Palm Anti Sensory (2nd Digit)  31.5C  Wrist 3.6 *4.6 1.0 23.3 11.5 50.6 Wrist Palm     Palm  *3.0   3.3        Radial Anti Sensory (Base 1st Digit)  31.9C  Wrist  2.1   41.0  Wrist Base 1st Digit     Ulnar Anti Sensory (5th Digit)  32C  Wrist  3.2   20.3  Wrist 5th Digit  44    Motor Left/Right Comparison   Stim Site L Lat (ms) R Lat (ms) L-R Lat (ms) L Amp (mV) R Amp (mV) L-R Amp (%) Site1 Site2 L Vel (m/s) R Vel (m/s) L-R Vel (m/s)  Median Motor (Abd Poll Brev)  31.2C  Wrist 3.5 *4.4 *0.9 6.0 *4.5 25.0 Elbow Wrist 51 *49 2  Elbow 7.0 8.3 1.3 4.2 3.4 19.0       Ulnar Motor (Abd Dig Min)  32.1C  Wrist  3.0   6.2  B Elbow Wrist  64   B Elbow  5.9   3.6  A Elbow B Elbow  77   A Elbow  7.2   6.3           Waveforms:                 Clinical History: No specialty comments available.   She reports that she has quit smoking. Her smoking use included cigarettes. She smoked an average of .25 packs per day. She has never used smokeless tobacco.  Recent Labs    02/12/22 1329  HGBA1C 5.6    Objective:  VS:  HT:    WT:   BMI:     BP:   HR: bpm  TEMP: ( )  RESP:  Physical Exam Vitals and nursing note reviewed.  Constitutional:      General: She is not in acute distress.    Appearance: Normal appearance. She is well-developed. She is not ill-appearing.  HENT:     Head: Normocephalic and atraumatic.  Eyes:     Conjunctiva/sclera: Conjunctivae normal.     Pupils: Pupils are equal, round, and reactive  to light.  Cardiovascular:     Rate and Rhythm: Normal rate.     Pulses: Normal pulses.  Pulmonary:     Effort: Pulmonary effort is normal.  Musculoskeletal:        General: Tenderness present. No swelling or deformity.     Cervical back: Neck supple. Tenderness present.     Right lower leg: No edema.     Left lower leg: No edema.     Comments: Inspection reveals flattening of the right APB but no atrophy of the bilateral APB or FDI or hand intrinsics. There is no swelling, color changes, allodynia or dystrophic changes. There is 5 out of 5 strength in the bilateral wrist extension, finger abduction and long finger flexion. There is intact sensation to light touch in all dermatomal and peripheral nerve distributions. There is a negative Froment's test bilaterally. There is a negative Tinel's test at the bilateral wrist and elbow. There is a positive Phalen's test bilaterally. There is a negative Hoffmann's test bilaterally.  Skin:    General: Skin is warm and dry.     Findings: No erythema or rash.  Neurological:  General: No focal deficit present.     Mental Status: She is alert and oriented to person, place, and time.     Sensory: No sensory deficit.     Motor: No weakness or abnormal muscle tone.     Coordination: Coordination normal.     Gait: Gait normal.  Psychiatric:        Mood and Affect: Mood normal.        Behavior: Behavior normal.     Ortho Exam  Imaging: No results found.  Past Medical/Family/Surgical/Social History: Medications & Allergies reviewed per EMR, new medications updated. Patient Active Problem List   Diagnosis Date Noted   Right carpal tunnel syndrome 08/21/2022   Trigger middle finger of right hand 08/21/2022   Mallet finger of right hand 08/18/2022   Nonintractable chronic migraine 02/12/2022   Dyslipidemia 07/02/2021   Chronic pain syndrome 07/02/2021   History of blood clots 07/02/2021   Erosive osteoarthritis of multiple sites 07/02/2021    Primary osteoarthritis of right hip 04/08/2017   History of hip replacement 03/25/2017   Primary osteoarthritis of left hip 12/22/2016   Major depressive disorder 10/24/2012   Headache(784.0) 05/13/2010   Microalbuminuria 04/16/2009   Left hip pain 11/22/2008   Bipolar 1 disorder, depressed ([place]) 05/17/2007   Lumbosacral degenerative disk disease 03/23/2005   Hypertension 08/18/2003   Past Medical History:  Diagnosis Date   Anemia    low iron during pregnancy   Anxiety    Arthritis    Asthma    as a child   Bipolar disorder ([place])    Depression    GERD (gastroesophageal reflux disease)    Headache    Hypertension    Left trimalleolar fracture    Pneumonia    as a child   PONV (postoperative nausea and vomiting)    Restless legs    Family History  Problem Relation Age of Onset   Stomach cancer Mother    HIV Mother    Cancer Mother    Colon cancer Sister 62   Breast cancer Neg Hx    Rectal cancer Neg Hx    Past Surgical History:  Procedure Laterality Date   BILATERAL ANTERIOR TOTAL HIP ARTHROPLASTY Bilateral 03/25/2017   Procedure: BILATERAL ANTERIOR TOTAL HIP ARTHROPLASTY;  Surgeon: Leandrew Koyanagi, MD;  Location: [facility];  Service: Orthopedics;  Laterality: Bilateral;   DENTAL SURGERY     all teeth extracted   INTRAVASCULAR ULTRASOUND/IVUS N/A 06/09/2019   Procedure: INTRAVASCULAR ULTRASOUND/IVUS;  Surgeon: Elam Dutch, MD;  Location: [facility];  Service: Cardiovascular;  Laterality: N/A;   LAMINECTOMY AND MICRODISCECTOMY LUMBAR SPINE   06/27/2010   ORIF ANKLE FRACTURE Left 03/17/2019   Procedure: OPEN REDUCTION INTERNAL FIXATION (ORIF) LEFT ANKLE FRACTURE;  Surgeon: Leandrew Koyanagi, MD;  Location: [facility];  Service: Orthopedics;  Laterality: Left;   PERIPHERAL VASCULAR INTERVENTION Left 06/09/2019   Procedure: PERIPHERAL VASCULAR INTERVENTION;  Surgeon: Elam Dutch, MD;  Location: [facility];  Service: Cardiovascular;   Laterality: Left;  Common Iliac vein   PERIPHERAL VASCULAR THROMBECTOMY Left 06/09/2019   Procedure: PERIPHERAL VASCULAR THROMBECTOMY;  Surgeon: Elam Dutch, MD;  Location: [facility];  Service: Cardiovascular;  Laterality: Left;   TUBAL LIGATION Bilateral    Social History   Occupational History   Not on file  Tobacco Use   Smoking status: Former    Packs/day: 0.25    Types: Cigarettes   Smokeless tobacco: Never  Vaping Use   Vaping Use: Never used  Substance and Sexual Activity   Alcohol use: No   Drug use: No   Sexual activity: Yes    Birth control/protection: Surgical    Comment: BTL

## 2022-09-24 ENCOUNTER — Ambulatory Visit: Payer: 59

## 2022-09-25 ENCOUNTER — Ambulatory Visit (INDEPENDENT_AMBULATORY_CARE_PROVIDER_SITE_OTHER): Payer: 59 | Admitting: Physician Assistant

## 2022-09-25 VITALS — BP 134/89 | HR 73 | Temp 98.0°F | Resp 20 | Ht 61.5 in | Wt 215.5 lb

## 2022-09-25 DIAGNOSIS — I871 Compression of vein: Secondary | ICD-10-CM | POA: Diagnosis not present

## 2022-09-25 DIAGNOSIS — I82402 Acute embolism and thrombosis of unspecified deep veins of left lower extremity: Secondary | ICD-10-CM | POA: Diagnosis not present

## 2022-09-25 NOTE — Progress Notes (Signed)
Office Note     CC:  follow up Requesting Provider:  Horald Pollen, *  HPI: Patricia Davila is a 52 y.o. 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 Oneida Alar in October 2020.  She has mild edema of left leg that worsens by the end of the day.  She does not wear compression or elevate her leg during the day.  She is on a baby aspirin daily.  She denies tobacco use.  She also complains of numbness on both legs after sitting or standing for long periods of time however also has known lumbar spine disease.   Past Medical History:  Diagnosis Date   Anemia    low iron during pregnancy   Anxiety    Arthritis    Asthma    as a child   Bipolar disorder ([place])    Depression    GERD (gastroesophageal reflux disease)    Headache    Hypertension    Left trimalleolar fracture    Pneumonia    as a child   PONV (postoperative nausea and vomiting)    Restless legs     Past Surgical History:  Procedure Laterality Date   BILATERAL ANTERIOR TOTAL HIP ARTHROPLASTY Bilateral 03/25/2017   Procedure: BILATERAL ANTERIOR TOTAL HIP ARTHROPLASTY;  Surgeon: Leandrew Koyanagi, MD;  Location: [facility];  Service: Orthopedics;  Laterality: Bilateral;   DENTAL SURGERY     all teeth extracted   INTRAVASCULAR ULTRASOUND/IVUS N/A 06/09/2019   Procedure: INTRAVASCULAR ULTRASOUND/IVUS;  Surgeon: Elam Dutch, MD;  Location: [facility];  Service: Cardiovascular;  Laterality: N/A;   LAMINECTOMY AND MICRODISCECTOMY LUMBAR SPINE   06/27/2010   ORIF ANKLE FRACTURE Left 03/17/2019   Procedure: OPEN REDUCTION INTERNAL FIXATION (ORIF) LEFT ANKLE FRACTURE;  Surgeon: Leandrew Koyanagi, MD;  Location: [facility];  Service: Orthopedics;  Laterality: Left;   PERIPHERAL VASCULAR INTERVENTION Left 06/09/2019   Procedure: PERIPHERAL VASCULAR INTERVENTION;  Surgeon: Elam Dutch, MD;  Location: [facility];  Service:  Cardiovascular;  Laterality: Left;  Common Iliac vein   PERIPHERAL VASCULAR THROMBECTOMY Left 06/09/2019   Procedure: PERIPHERAL VASCULAR THROMBECTOMY;  Surgeon: Elam Dutch, MD;  Location: [facility];  Service: Cardiovascular;  Laterality: Left;   TUBAL LIGATION Bilateral     Social History   Socioeconomic History   Marital status: Married    Spouse name: Not on file   Number of children: Not on file   Years of education: Not on file   Highest education level: Not on file  Occupational History   Not on file  Tobacco Use   Smoking status: Former    Packs/day: 0.25    Types: Cigarettes    Passive exposure: Current   Smokeless tobacco: Never  Vaping Use   Vaping Use: Never used  Substance and Sexual Activity   Alcohol use: No   Drug use: No   Sexual activity: Yes    Birth control/protection: Surgical    Comment: BTL  Other Topics Concern   Not on file  Social History Narrative   Not on file   Social Determinants of Health   Financial Resource Strain: Not on file  Food Insecurity: Not on file  Transportation Needs: Not on file  Physical Activity: Not on file  Stress: Not on file  Social Connections: Not on file  Intimate Partner Violence: Not on file    Family  History  Problem Relation Age of Onset   Stomach cancer Mother    HIV Mother    Cancer Mother    Colon cancer Sister 65   Breast cancer Neg Hx    Rectal cancer Neg Hx     Current Outpatient Medications  Medication Sig Dispense Refill   amLODipine (NORVASC) 10 MG tablet TAKE 1 TABLET(10 MG) BY MOUTH DAILY 90 tablet 1   aspirin EC 81 MG tablet Take 81 mg by mouth daily. Swallow whole.     atorvastatin (LIPITOR) 10 MG tablet TAKE 1 TABLET(10 MG) BY MOUTH DAILY 90 tablet 3   benzonatate (TESSALON) 100 MG capsule Take 1 capsule (100 mg total) by mouth every 8 (eight) hours. 21 capsule 0   Cholecalciferol (VITAMIN D3) 50 MCG (2000 UT) TABS TAKE 1 TABLET BY MOUTH EVERY DAY 30 tablet 2    cyclobenzaprine (FLEXERIL) 5 MG tablet Take 1 tablet (5 mg total) by mouth 3 (three) times daily as needed for muscle spasms. 30 tablet 3   DULoxetine (CYMBALTA) 30 MG capsule Take 1 capsule (30 mg total) by mouth daily. X 1 week, then 60 mg daily- for nerve and back pain 60 capsule 5   Galcanezumab-gnlm (EMGALITY) 120 MG/ML SOAJ Inject 120 mg into the skin every 30 (thirty) days. 1.12 mL 6   hydrochlorothiazide (HYDRODIURIL) 12.5 MG tablet Take 1 tablet (12.5 mg total) by mouth daily. 90 tablet 3   lidocaine (XYLOCAINE) 5 % ointment Apply 1 Application topically 4 (four) times daily. Apply with voltaren gel 4x/day to L ankle. Dime sized amount- for burning/tingling. 50 g 5   losartan (COZAAR) 50 MG tablet Take 1 tablet (50 mg total) by mouth daily. 90 tablet 1   Multiple Vitamin (MULTIVITAMIN) tablet Take 1 tablet by mouth daily.     omeprazole (PRILOSEC) 40 MG capsule Take 1 capsule (40 mg total) by mouth daily. 90 capsule 3   pregabalin (LYRICA) 100 MG capsule Take 1 capsule (100 mg total) by mouth 3 (three) times daily. Take 1 tab nightly x 1 week, then 100 mg in AM and 200 mg night- for nerve pain 90 capsule 5   venlafaxine XR (EFFEXOR XR) 37.5 MG 24 hr capsule Take 1 capsule (37.5 mg total) by mouth daily with breakfast. 30 capsule 3   Vitamin D, Ergocalciferol, (DRISDOL) 1.25 MG (50000 UNIT) CAPS capsule Take 1 capsule (50,000 Units total) by mouth every 7 (seven) days. 12 capsule 1   zolmitriptan (ZOMIG) 5 MG tablet Take 1 tablet (5 mg total) by mouth as needed for migraine. May repeat dose in 2 hours if headache persists. Max dose 2 pills in 24 hours 10 tablet 6   No current facility-administered medications for this visit.    Allergies  Allergen Reactions   Ativan [Lorazepam] Anxiety    HALLUCINATIONS   Anesthesia S-I-40 [Propofol]     hallucinations   Ditropan [Oxybutynin]     Anxiety   Tylenol With Codeine #3 [Acetaminophen-Codeine] Hives and Itching     REVIEW OF SYSTEMS:    [X]$  denotes positive finding, [ ]$  denotes negative finding Cardiac  Comments:  Chest pain or chest pressure:    Shortness of breath upon exertion:    Short of breath when lying flat:    Irregular heart rhythm:        Vascular    Pain in calf, thigh, or hip brought on by ambulation:    Pain in feet at night that wakes you up from your sleep:  Blood clot in your veins:    Leg swelling:         Pulmonary    Oxygen at home:    Productive cough:     Wheezing:         Neurologic    Sudden weakness in arms or legs:     Sudden numbness in arms or legs:     Sudden onset of difficulty speaking or slurred speech:    Temporary loss of vision in one eye:     Problems with dizziness:         Gastrointestinal    Blood in stool:     Vomited blood:         Genitourinary    Burning when urinating:     Blood in urine:        Psychiatric    Major depression:         Hematologic    Bleeding problems:    Problems with blood clotting too easily:        Skin    Rashes or ulcers:        Constitutional    Fever or chills:      PHYSICAL EXAMINATION:  Vitals:   09/25/22 0912  BP: 134/89  Pulse: 73  Resp: 20  Temp: 98 F (36.7 C)  TempSrc: Temporal  SpO2: 99%  Weight: 215 lb 8 oz (97.8 kg)  Height: 5' 1.5" (1.562 m)    General:  WDWN in NAD; vital signs documented above Gait: Not observed HENT: WNL, normocephalic Pulmonary: normal non-labored breathing , without Rales, rhonchi,  wheezing Cardiac: regular HR Abdomen: soft, NT, no masses Skin: without rashes Vascular Exam/Pulses:  Right Left  DP 2+ (normal) 2+ (normal)   Extremities: without ischemic changes, without Gangrene , without cellulitis; without open wounds;  Musculoskeletal: no muscle wasting or atrophy  Neurologic: A&O X 3;  No focal weakness or paresthesias are detected Psychiatric:  The pt has Normal affect.   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  She does have chronic appearing thrombus in the distal common femoral vein/saphenofemoral junction however this is stable compared to last year.  Continue baby aspirin daily.  Discussed that patient will likely have swelling of her left leg for the rest of her life.  She should manage the symptoms with knee-high compression socks, periodic elevation of the leg during the day, and avoiding prolonged sitting and standing.  Repeat IVC/iliac venous duplex in 1 year.  It should also be noted that symptoms of numbness in both legs when standing or sitting for long periods of time is more likely related to lumbar spine.  She will follow-up with her specialist for further workup.   Dagoberto Ligas, PA-C [facility] [phone]  Clinic MD:   Virl Cagey

## 2022-09-29 ENCOUNTER — Encounter: Payer: Self-pay | Admitting: Orthopaedic Surgery

## 2022-09-29 ENCOUNTER — Ambulatory Visit (INDEPENDENT_AMBULATORY_CARE_PROVIDER_SITE_OTHER): Payer: 59 | Admitting: Orthopaedic Surgery

## 2022-09-29 DIAGNOSIS — G5601 Carpal tunnel syndrome, right upper limb: Secondary | ICD-10-CM

## 2022-09-29 DIAGNOSIS — M65841 Other synovitis and tenosynovitis, right hand: Secondary | ICD-10-CM

## 2022-09-29 NOTE — Progress Notes (Signed)
Office Visit Note   Patient: Patricia Davila           Date of Birth: Feb 23, 1971           MRN: [id number] Visit Date: 09/29/2022              Requested by: Patricia Davila, [place],  [place] [address] PCP: Patricia Pollen, MD   Assessment & Plan: Visit Diagnoses:  1. Right carpal tunnel syndrome   2. Stenosing tenosynovitis of finger of right hand     Plan: EMGs show very severe right carpal tunnel syndrome.  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  She has had 2 injections with temporary relief.  At this point she would like to move forward with a right carpal tunnel release and trigger finger release.  Patricia Davila will call the patient to schedule surgical time for March or April per patient request.  Follow-Up Instructions: No follow-ups on file.   Orders:  No orders of the defined types were placed in this encounter.  No orders of the defined types were placed in this encounter.     Procedures: No procedures performed   Clinical Data: No additional findings.   Subjective: Chief Complaint  Patient presents with   Right Hand - Follow-up    EMG review    HPI  Patricia Davila returns today to review EMG results  Review of Systems   Objective: Vital Signs: There were no vitals taken for this visit.  Physical Exam  Ortho Exam  Examination right hand is unchanged.  Specialty Comments:  No specialty comments available.  Imaging: No results found.   PMFS History: Patient Active Problem List   Diagnosis Date Noted   Right carpal tunnel syndrome 08/21/2022   Trigger middle finger of right hand 08/21/2022   Mallet finger of right hand 08/18/2022   Nonintractable chronic migraine 02/12/2022   Dyslipidemia 07/02/2021   Chronic pain syndrome 07/02/2021   History of blood clots 07/02/2021   Erosive osteoarthritis of multiple sites 07/02/2021   Primary osteoarthritis of right hip  04/08/2017   History of hip replacement 03/25/2017   Primary osteoarthritis of left hip 12/22/2016   Major depressive disorder 10/24/2012   Headache(784.0) 05/13/2010   Microalbuminuria 04/16/2009   Left hip pain 11/22/2008   Bipolar 1 disorder, depressed ([place]) 05/17/2007   Lumbosacral degenerative disk disease 03/23/2005   Hypertension 08/18/2003   Past Medical History:  Diagnosis Date   Anemia    low iron during pregnancy   Anxiety    Arthritis    Asthma    as a child   Bipolar disorder ([place])    Depression    GERD (gastroesophageal reflux disease)    Headache    Hypertension    Left trimalleolar fracture    Pneumonia    as a child   PONV (postoperative nausea and vomiting)    Restless legs     Family History  Problem Relation Age of Onset   Stomach cancer Mother    HIV Mother    Cancer Mother    Colon cancer Sister 29   Breast cancer Neg Hx    Rectal cancer Neg Hx     Past Surgical History:  Procedure Laterality Date   BILATERAL ANTERIOR TOTAL HIP ARTHROPLASTY Bilateral 03/25/2017   Procedure: BILATERAL ANTERIOR TOTAL HIP ARTHROPLASTY;  Surgeon: Patricia Koyanagi, MD;  Location: [facility];  Service: Orthopedics;  Laterality: Bilateral;  DENTAL SURGERY     all teeth extracted   INTRAVASCULAR ULTRASOUND/IVUS N/A 06/09/2019   Procedure: INTRAVASCULAR ULTRASOUND/IVUS;  Surgeon: Patricia Dutch, MD;  Location: [facility];  Service: Cardiovascular;  Laterality: N/A;   LAMINECTOMY AND MICRODISCECTOMY LUMBAR SPINE   06/27/2010   ORIF ANKLE FRACTURE Left 03/17/2019   Procedure: OPEN REDUCTION INTERNAL FIXATION (ORIF) LEFT ANKLE FRACTURE;  Surgeon: Patricia Koyanagi, MD;  Location: [facility];  Service: Orthopedics;  Laterality: Left;   PERIPHERAL VASCULAR INTERVENTION Left 06/09/2019   Procedure: PERIPHERAL VASCULAR INTERVENTION;  Surgeon: Patricia Dutch, MD;  Location: [facility];  Service: Cardiovascular;  Laterality: Left;  Common Iliac vein    PERIPHERAL VASCULAR THROMBECTOMY Left 06/09/2019   Procedure: PERIPHERAL VASCULAR THROMBECTOMY;  Surgeon: Patricia Dutch, MD;  Location: [facility];  Service: Cardiovascular;  Laterality: Left;   TUBAL LIGATION Bilateral    Social History   Occupational History   Not on file  Tobacco Use   Smoking status: Former    Packs/day: 0.25    Types: Cigarettes    Passive exposure: Current   Smokeless tobacco: Never  Vaping Use   Vaping Use: Never used  Substance and Sexual Activity   Alcohol use: No   Drug use: No   Sexual activity: Yes    Birth control/protection: Surgical    Comment: BTL

## 2022-10-01 ENCOUNTER — Ambulatory Visit (INDEPENDENT_AMBULATORY_CARE_PROVIDER_SITE_OTHER): Payer: 59

## 2022-10-01 VITALS — Ht 61.5 in | Wt 215.0 lb

## 2022-10-01 DIAGNOSIS — Z0000 Encounter for general adult medical examination without abnormal findings: Secondary | ICD-10-CM

## 2022-10-01 NOTE — Patient Instructions (Signed)
Patricia Davila , Thank you for taking time to come for your Medicare Wellness Visit. I appreciate your ongoing commitment to your health goals. Please review the following plan we discussed and let me know if I can assist you in the future.   These are the goals we discussed:  Goals      Blood Pressure < 140/90     DIET - EAT MORE FRUITS AND VEGETABLES     Quit smoking / using tobacco        This is a list of the screening recommended for you and due dates:  Health Maintenance  Topic Date Due   COVID-19 Vaccine (1) Never done   Pap Smear  11/09/2022   Medicare Annual Wellness Visit  10/02/2023   Mammogram  10/08/2023   Colon Cancer Screening  10/08/2026   Flu Shot  Completed   Hepatitis C Screening: USPSTF Recommendation to screen - Ages 18-79 yo.  Completed   HIV Screening  Completed   Zoster (Shingles) Vaccine  Completed   HPV Vaccine  Aged Out   DTaP/Tdap/Td vaccine  Discontinued    Advanced directives: no  Conditions/risks identified: none  Next appointment: Follow up in one year for your annual wellness visit 10/04/23 @ 10:30 am by phone   Preventive Care 65 Years and Older, Female Preventive care refers to lifestyle choices and visits with your health care provider that can promote health and wellness. What does preventive care include? A yearly physical exam. This is also called an annual well check. Dental exams once or twice a year. Routine eye exams. Ask your health care provider how often you should have your eyes checked. Personal lifestyle choices, including: Daily care of your teeth and gums. Regular physical activity. Eating a healthy diet. Avoiding tobacco and drug use. Limiting alcohol use. Practicing safe sex. Taking low-dose aspirin every day. Taking vitamin and mineral supplements as recommended by your health care provider. What happens during an annual well check? The services and screenings done by your health care provider during your annual well  check will depend on your age, overall health, lifestyle risk factors, and family history of disease. Counseling  Your health care provider may ask you questions about your: Alcohol use. Tobacco use. Drug use. Emotional well-being. Home and relationship well-being. Sexual activity. Eating habits. History of falls. Memory and ability to understand (cognition). Work and work Statistician. Reproductive health. Screening  You may have the following tests or measurements: Height, weight, and BMI. Blood pressure. Lipid and cholesterol levels. These may be checked every 5 years, or more frequently if you are over 27 years old. Skin check. Lung cancer screening. You may have this screening every year starting at age 40 if you have a 30-pack-year history of smoking and currently smoke or have quit within the past 15 years. Fecal occult blood test (FOBT) of the stool. You may have this test every year starting at age 70. Flexible sigmoidoscopy or colonoscopy. You may have a sigmoidoscopy every 5 years or a colonoscopy every 10 years starting at age 68. Hepatitis C blood test. Hepatitis B blood test. Sexually transmitted disease (STD) testing. Diabetes screening. This is done by checking your blood sugar (glucose) after you have not eaten for a while (fasting). You may have this done every 1-3 years. Bone density scan. This is done to screen for osteoporosis. You may have this done starting at age 22. Mammogram. This may be done every 1-2 years. Talk to your health care provider  about how often you should have regular mammograms. Talk with your health care provider about your test results, treatment options, and if necessary, the need for more tests. Vaccines  Your health care provider may recommend certain vaccines, such as: Influenza vaccine. This is recommended every year. Tetanus, diphtheria, and acellular pertussis (Tdap, Td) vaccine. You may need a Td booster every 10 years. Zoster  vaccine. You may need this after age 12. Pneumococcal 13-valent conjugate (PCV13) vaccine. One dose is recommended after age 25. Pneumococcal polysaccharide (PPSV23) vaccine. One dose is recommended after age 2. Talk to your health care provider about which screenings and vaccines you need and how often you need them. This information is not intended to replace advice given to you by your health care provider. Make sure you discuss any questions you have with your health care provider. Document Released: 08/30/2015 Document Revised: 04/22/2016 Document Reviewed: 06/04/2015 Elsevier Interactive Patient Education  2017 [employer] Prevention in the Home Falls can cause injuries. They can happen to people of all ages. There are many things you can do to make your home safe and to help prevent falls. What can I do on the outside of my home? Regularly fix the edges of walkways and driveways and fix any cracks. Remove anything that might make you trip as you walk through a door, such as a raised step or threshold. Trim any bushes or trees on the path to your home. Use bright outdoor lighting. Clear any walking paths of anything that might make someone trip, such as rocks or tools. Regularly check to see if handrails are loose or broken. Make sure that both sides of any steps have handrails. Any raised decks and porches should have guardrails on the edges. Have any leaves, snow, or ice cleared regularly. Use sand or salt on walking paths during winter. Clean up any spills in your garage right away. This includes oil or grease spills. What can I do in the bathroom? Use night lights. Install grab bars by the toilet and in the tub and shower. Do not use towel bars as grab bars. Use non-skid mats or decals in the tub or shower. If you need to sit down in the shower, use a plastic, non-slip stool. Keep the floor dry. Clean up any water that spills on the floor as soon as it happens. Remove  soap buildup in the tub or shower regularly. Attach bath mats securely with double-sided non-slip rug tape. Do not have throw rugs and other things on the floor that can make you trip. What can I do in the bedroom? Use night lights. Make sure that you have a light by your bed that is easy to reach. Do not use any sheets or blankets that are too big for your bed. They should not hang down onto the floor. Have a firm chair that has side arms. You can use this for support while you get dressed. Do not have throw rugs and other things on the floor that can make you trip. What can I do in the kitchen? Clean up any spills right away. Avoid walking on wet floors. Keep items that you use a lot in easy-to-reach places. If you need to reach something above you, use a strong step stool that has a grab bar. Keep electrical cords out of the way. Do not use floor polish or wax that makes floors slippery. If you must use wax, use non-skid floor wax. Do not have throw rugs and  other things on the floor that can make you trip. What can I do with my stairs? Do not leave any items on the stairs. Make sure that there are handrails on both sides of the stairs and use them. Fix handrails that are broken or loose. Make sure that handrails are as long as the stairways. Check any carpeting to make sure that it is firmly attached to the stairs. Fix any carpet that is loose or worn. Avoid having throw rugs at the top or bottom of the stairs. If you do have throw rugs, attach them to the floor with carpet tape. Make sure that you have a light switch at the top of the stairs and the bottom of the stairs. If you do not have them, ask someone to add them for you. What else can I do to help prevent falls? Wear shoes that: Do not have high heels. Have rubber bottoms. Are comfortable and fit you well. Are closed at the toe. Do not wear sandals. If you use a stepladder: Make sure that it is fully opened. Do not climb a  closed stepladder. Make sure that both sides of the stepladder are locked into place. Ask someone to hold it for you, if possible. Clearly mark and make sure that you can see: Any grab bars or handrails. First and last steps. Where the edge of each step is. Use tools that help you move around (mobility aids) if they are needed. These include: Canes. Walkers. Scooters. Crutches. Turn on the lights when you go into a dark area. Replace any light bulbs as soon as they burn out. Set up your furniture so you have a clear path. Avoid moving your furniture around. If any of your floors are uneven, fix them. If there are any pets around you, be aware of where they are. Review your medicines with your doctor. Some medicines can make you feel dizzy. This can increase your chance of falling. Ask your doctor what other things that you can do to help prevent falls. This information is not intended to replace advice given to you by your health care provider. Make sure you discuss any questions you have with your health care provider. Document Released: 05/30/2009 Document Revised: 01/09/2016 Document Reviewed: 09/07/2014 Elsevier Interactive Patient Education  2017 [employer].

## 2022-10-01 NOTE — Progress Notes (Signed)
I connected with  Patricia Davila on 10/01/22 by a audio enabled telemedicine application and verified that I am speaking with the correct person using two identifiers.  Patient Location: Home  Provider Location: Office/Clinic  I discussed the limitations of evaluation and management by telemedicine. The patient expressed understanding and agreed to proceed.  Subjective:   Patricia Davila is a 52 y.o. female who presents for Medicare Annual (Subsequent) preventive examination.  Review of Systems     Cardiac Risk Factors include: advanced age (>68mn, >>40women);hypertension     Objective:    There were no vitals filed for this visit. There is no height or weight on file to calculate BMI.     10/01/2022    9:52 AM 10/13/2021   11:31 AM 12/27/2020   11:54 AM 12/13/2019   12:04 PM 07/06/2019    9:14 AM 06/07/2019   11:00 PM 03/17/2019   11:17 AM  Advanced Directives  Does Patient Have a Medical Advance Directive? No No No No No No No  Would patient like information on creating a medical advance directive? No - Patient declined  No - Patient declined No - Patient declined No - Patient declined No - Patient declined No - Patient declined    Current Medications (verified) Outpatient Encounter Medications as of 10/01/2022  Medication Sig   amLODipine (NORVASC) 10 MG tablet TAKE 1 TABLET(10 MG) BY MOUTH DAILY   aspirin EC 81 MG tablet Take 81 mg by mouth daily. Swallow whole.   atorvastatin (LIPITOR) 10 MG tablet TAKE 1 TABLET(10 MG) BY MOUTH DAILY   cyclobenzaprine (FLEXERIL) 5 MG tablet Take 1 tablet (5 mg total) by mouth 3 (three) times daily as needed for muscle spasms.   DULoxetine (CYMBALTA) 30 MG capsule Take 1 capsule (30 mg total) by mouth daily. X 1 week, then 60 mg daily- for nerve and back pain   hydrochlorothiazide (HYDRODIURIL) 12.5 MG tablet Take 1 tablet (12.5 mg total) by mouth daily.   lidocaine (XYLOCAINE) 5 % ointment Apply 1 Application topically 4 (four) times daily.  Apply with voltaren gel 4x/day to L ankle. Dime sized amount- for burning/tingling.   losartan (COZAAR) 50 MG tablet Take 1 tablet (50 mg total) by mouth daily.   Multiple Vitamin (MULTIVITAMIN) tablet Take 1 tablet by mouth daily.   omeprazole (PRILOSEC) 40 MG capsule Take 1 capsule (40 mg total) by mouth daily.   pregabalin (LYRICA) 100 MG capsule Take 1 capsule (100 mg total) by mouth 3 (three) times daily. Take 1 tab nightly x 1 week, then 100 mg in AM and 200 mg night- for nerve pain   venlafaxine XR (EFFEXOR XR) 37.5 MG 24 hr capsule Take 1 capsule (37.5 mg total) by mouth daily with breakfast.   zolmitriptan (ZOMIG) 5 MG tablet Take 1 tablet (5 mg total) by mouth as needed for migraine. May repeat dose in 2 hours if headache persists. Max dose 2 pills in 24 hours   benzonatate (TESSALON) 100 MG capsule Take 1 capsule (100 mg total) by mouth every 8 (eight) hours. (Patient not taking: Reported on 10/01/2022)   Cholecalciferol (VITAMIN D3) 50 MCG (2000 UT) TABS TAKE 1 TABLET BY MOUTH EVERY DAY (Patient not taking: Reported on 10/01/2022)   Galcanezumab-gnlm (EMGALITY) 120 MG/ML SOAJ Inject 120 mg into the skin every 30 (thirty) days. (Patient not taking: Reported on 10/01/2022)   Vitamin D, Ergocalciferol, (DRISDOL) 1.25 MG (50000 UNIT) CAPS capsule Take 1 capsule (50,000 Units total) by mouth every  7 (seven) days. (Patient not taking: Reported on 10/01/2022)   No facility-administered encounter medications on file as of 10/01/2022.    Allergies (verified) Ativan [lorazepam], Anesthesia s-i-40 [propofol], Ditropan [oxybutynin], and Tylenol with codeine #3 [acetaminophen-codeine]   History: Past Medical History:  Diagnosis Date   Anemia    low iron during pregnancy   Anxiety    Arthritis    Asthma    as a child   Bipolar disorder ([place])    Depression    GERD (gastroesophageal reflux disease)    Headache    Hypertension    Left trimalleolar fracture    Pneumonia    as a child   PONV  (postoperative nausea and vomiting)    Restless legs    Past Surgical History:  Procedure Laterality Date   BILATERAL ANTERIOR TOTAL HIP ARTHROPLASTY Bilateral 03/25/2017   Procedure: BILATERAL ANTERIOR TOTAL HIP ARTHROPLASTY;  Surgeon: Leandrew Koyanagi, MD;  Location: [place];  Service: Orthopedics;  Laterality: Bilateral;   DENTAL SURGERY     all teeth extracted   INTRAVASCULAR ULTRASOUND/IVUS N/A 06/09/2019   Procedure: INTRAVASCULAR ULTRASOUND/IVUS;  Surgeon: Elam Dutch, MD;  Location: [place];  Service: Cardiovascular;  Laterality: N/A;   LAMINECTOMY AND MICRODISCECTOMY LUMBAR SPINE   06/27/2010   ORIF ANKLE FRACTURE Left 03/17/2019   Procedure: OPEN REDUCTION INTERNAL FIXATION (ORIF) LEFT ANKLE FRACTURE;  Surgeon: Leandrew Koyanagi, MD;  Location: [place];  Service: Orthopedics;  Laterality: Left;   PERIPHERAL VASCULAR INTERVENTION Left 06/09/2019   Procedure: PERIPHERAL VASCULAR INTERVENTION;  Surgeon: Elam Dutch, MD;  Location: [facility];  Service: Cardiovascular;  Laterality: Left;  Common Iliac vein   PERIPHERAL VASCULAR THROMBECTOMY Left 06/09/2019   Procedure: PERIPHERAL VASCULAR THROMBECTOMY;  Surgeon: Elam Dutch, MD;  Location: [place] [facility];  Service: Cardiovascular;  Laterality: Left;   TUBAL LIGATION Bilateral    Family History  Problem Relation Age of Onset   Stomach cancer Mother    HIV Mother    Cancer Mother    Colon cancer Sister 105   Breast cancer Neg Hx    Rectal cancer Neg Hx    Social History   Socioeconomic History   Marital status: Married    Spouse name: Not on file   Number of children: Not on file   Years of education: Not on file   Highest education level: Not on file  Occupational History   Not on file  Tobacco Use   Smoking status: Former    Packs/day: 0.25    Types: Cigarettes    Passive exposure: Current   Smokeless tobacco: Never  Vaping Use   Vaping Use: Never used  Substance and  Sexual Activity   Alcohol use: No   Drug use: No   Sexual activity: Yes    Birth control/protection: Surgical    Comment: BTL  Other Topics Concern   Not on file  Social History Narrative   Not on file   Social Determinants of Health   Financial Resource Strain: Low Risk  (10/01/2022)   Overall Financial Resource Strain (CARDIA)    Difficulty of Paying Living Expenses: Not hard at all  Food Insecurity: No Food Insecurity (10/01/2022)   Hunger Vital Sign    Worried About Running Out of Food in the Last Year: Never true    [place] in the Last Year: Never true  Transportation Needs: No Transportation Needs (10/01/2022)   [place] -  Hydrologist (Medical): No    Lack of Transportation (Non-Medical): No  Physical Activity: Sufficiently Active (10/01/2022)   Exercise Vital Sign    Days of Exercise per Week: 2 days    Minutes of Exercise per Session: 120 min  Stress: No Stress Concern Present (10/01/2022)   [place]    Feeling of Stress : Not at all  Social Connections: Moderately Integrated (10/01/2022)   Social Connection and Isolation Panel [NHANES]    Frequency of Communication with Friends and Family: More than three times a week    Frequency of Social Gatherings with Friends and Family: More than three times a week    Attends Religious Services: More than 4 times per year    Active Member of [employer] or Organizations: No    Attends Music therapist: Never    Marital Status: Married    Tobacco Counseling Counseling given: Not Answered   Clinical Intake:  Pre-visit preparation completed: Yes  Pain : No/denies pain     Nutritional Risks: None Diabetes: No  How often do you need to have someone help you when you read instructions, pamphlets, or other written materials from your doctor or pharmacy?: 1 - Never  Diabetic?no  Interpreter Needed?:  No  Information entered by :: Kirke Shaggy  LPN   Activities of Daily Living    10/01/2022    9:53 AM 09/29/2022    3:13 PM  In your present state of health, do you have any difficulty performing the following activities:  Hearing? 0 0  Vision? 1 1  Difficulty concentrating or making decisions? 0 0  Walking or climbing stairs? 0 1  Dressing or bathing? 0 0  Doing errands, shopping? 0 0  Preparing Food and eating ? N N  Using the Toilet? N N  In the past six months, have you accidently leaked urine? Y Y  Do you have problems with loss of bowel control? N N  Managing your Medications? N N  Managing your Finances? N N  Housekeeping or managing your Housekeeping? N N    Patient Care Team: Horald Pollen, MD as PCP - General (Internal Medicine)  Indicate any recent Medical Services you may have received from other than [facility] providers in the past year (date may be approximate).     Assessment:   This is a routine wellness examination for Dennys.  Hearing/Vision screen Hearing Screening - Comments:: No aids Vision Screening - Comments:: Wears glasses- Dr.Groat  Dietary issues and exercise activities discussed: Current Exercise Habits: Home exercise routine, Type of exercise: Other - [facility] Time (Minutes): > 60, Frequency (Times/Week): 2, Weekly Exercise (Minutes/Week): 0, Intensity: Moderate   Goals Addressed             This Visit's Progress    DIET - EAT MORE FRUITS AND VEGETABLES         Depression Screen    10/01/2022    9:51 AM 08/18/2022    2:29 PM 05/01/2022   10:56 AM 02/12/2022    1:08 PM 10/13/2021   11:30 AM 05/07/2021    4:10 PM 04/23/2021    9:53 PM  PHQ 2/9 Scores  PHQ - 2 Score 0 0 0 0 1 0 0  PHQ- 9 Score 0    2      Fall Risk    10/01/2022    9:53 AM 09/29/2022  3:13 PM 08/18/2022    2:29 PM 05/01/2022   10:56 AM 02/12/2022    1:08 PM  Fall Risk   Falls in the past year? 0 0 0 0 0  Number falls in past yr: 0 0 0     Injury with Fall? 0 0 0    Risk for fall due to : No Fall Risks  No Fall Risks    Follow up Falls prevention discussed;Falls evaluation completed  Falls evaluation completed      FALL RISK PREVENTION PERTAINING TO THE HOME:  Any stairs in or around the home? No  If so, are there any without handrails? No  Home free of loose throw rugs in walkways, pet beds, electrical cords, etc? Yes  Adequate lighting in your home to reduce risk of falls? Yes   ASSISTIVE DEVICES UTILIZED TO PREVENT FALLS:  Life alert? No  Use of a cane, walker or w/c? Yes  Grab bars in the bathroom? Yes  Shower chair or bench in shower? No  Elevated toilet seat or a handicapped toilet? No    Cognitive Function:    04/23/2021    9:54 PM  MMSE - Mini Mental State Exam  Orientation to time 5  Registration 3  Recall 3  Language- repeat 1  Language- read & follow direction 1  Copy design 1        10/01/2022    9:58 AM  6CIT Screen  What Year? 0 points  What month? 0 points  What time? 0 points  Count back from 20 0 points  Months in reverse 4 points  Repeat phrase 2 points  Total Score 6 points    Immunizations Immunization History  Administered Date(s) Administered   Influenza,inj,Quad PF,6+ Mos 05/19/2018, 06/10/2019, 05/07/2021, 08/18/2022   Td 08/17/2005   Tdap 07/10/2012   Zoster Recombinat (Shingrix) 02/12/2022, 08/18/2022    TDAP status: Due, Education has been provided regarding the importance of this vaccine. Advised may receive this vaccine at local pharmacy or Health Dept. Aware to provide a copy of the vaccination record if obtained from local pharmacy or Health Dept. Verbalized acceptance and understanding.  Flu Vaccine status: Up to date  Pneumococcal vaccine status: Declined,  Education has been provided regarding the importance of this vaccine but patient still declined. Advised may receive this vaccine at local pharmacy or Health Dept. Aware to provide a copy of the vaccination  record if obtained from local pharmacy or Health Dept. Verbalized acceptance and understanding.   Covid-19 vaccine status: Declined, Education has been provided regarding the importance of this vaccine but patient still declined. Advised may receive this vaccine at local pharmacy or Health Dept.or vaccine clinic. Aware to provide a copy of the vaccination record if obtained from local pharmacy or Health Dept. Verbalized acceptance and understanding.  Qualifies for Shingles Vaccine? Yes   Zostavax completed No   Shingrix Completed?: Yes  Screening Tests Health Maintenance  Topic Date Due   COVID-19 Vaccine (1) Never done   PAP SMEAR-Modifier  11/09/2022   Medicare Annual Wellness (AWV)  10/02/2023   MAMMOGRAM  10/08/2023   COLONOSCOPY (Pts 45-56yr Insurance coverage will need to be confirmed)  10/08/2026   INFLUENZA VACCINE  Completed   Hepatitis C Screening  Completed   HIV Screening  Completed   Zoster Vaccines- Shingrix  Completed   HPV VACCINES  Aged Out   DTaP/Tdap/Td  Discontinued    Health Maintenance  Health Maintenance Due  Topic Date Due  COVID-19 Vaccine (1) Never done   PAP SMEAR-Modifier  11/09/2022    Colorectal cancer screening: Type of screening: Colonoscopy. Completed 10/08/21. Repeat every 5 years  Mammogram status: Completed scheduled 10/16/22. Repeat every year   Lung Cancer Screening: (Low Dose CT Chest recommended if Age 63-80 years, 30 pack-year currently smoking OR have quit w/in 15years.) does qualify.   Lung Cancer Screening Referral: declined referral (for next 6 months)  Additional Screening:  Hepatitis C Screening: does qualify; Completed 05/23/20  Vision Screening: Recommended annual ophthalmology exams for early detection of glaucoma and other disorders of the eye. Is the patient up to date with their annual eye exam?  Yes  Who is the provider or what is the name of the office in which the patient attends annual eye exams? Dr.Groat If pt is  not established with a provider, would they like to be referred to a provider to establish care? No .   Dental Screening: Recommended annual dental exams for proper oral hygiene  Community Resource Referral / Chronic Care Management: CRR required this visit?  No   CCM required this visit?  No      Plan:     I have personally reviewed and noted the following in the patient's chart:   Medical and social history Use of alcohol, tobacco or illicit drugs  Current medications and supplements including opioid prescriptions. Patient is not currently taking opioid prescriptions. Functional ability and status Nutritional status Physical activity Advanced directives List of other physicians Hospitalizations, surgeries, and ER visits in previous 12 months Vitals Screenings to include cognitive, depression, and falls Referrals and appointments  In addition, I have reviewed and discussed with patient certain preventive protocols, quality metrics, and best practice recommendations. A written personalized care plan for preventive services as well as general preventive health recommendations were provided to patient.     Dionisio David, LPN   [phone]   Nurse Notes: none

## 2022-10-06 ENCOUNTER — Ambulatory Visit: Payer: 59 | Admitting: Physical Therapy

## 2022-10-06 NOTE — Therapy (Incomplete)
OUTPATIENT PHYSICAL THERAPY THORACOLUMBAR EVALUATION   Patient Name: Patricia Davila MRN: [id number] DOB:[birth year], 52 y.o., female Today's Date: 10/06/2022  END OF SESSION:   Past Medical History:  Diagnosis Date   Anemia    low iron during pregnancy   Anxiety    Arthritis    Asthma    as a child   Bipolar disorder ([place])    Depression    GERD (gastroesophageal reflux disease)    Headache    Hypertension    Left trimalleolar fracture    Pneumonia    as a child   PONV (postoperative nausea and vomiting)    Restless legs    Past Surgical History:  Procedure Laterality Date   BILATERAL ANTERIOR TOTAL HIP ARTHROPLASTY Bilateral 03/25/2017   Procedure: BILATERAL ANTERIOR TOTAL HIP ARTHROPLASTY;  Surgeon: Leandrew Koyanagi, MD;  Location: [place];  Service: Orthopedics;  Laterality: Bilateral;   DENTAL SURGERY     all teeth extracted   INTRAVASCULAR ULTRASOUND/IVUS N/A 06/09/2019   Procedure: INTRAVASCULAR ULTRASOUND/IVUS;  Surgeon: Elam Dutch, MD;  Location: [place] [facility];  Service: Cardiovascular;  Laterality: N/A;   LAMINECTOMY AND MICRODISCECTOMY LUMBAR SPINE   06/27/2010   ORIF ANKLE FRACTURE Left 03/17/2019   Procedure: OPEN REDUCTION INTERNAL FIXATION (ORIF) LEFT ANKLE FRACTURE;  Surgeon: Leandrew Koyanagi, MD;  Location: [place];  Service: Orthopedics;  Laterality: Left;   PERIPHERAL VASCULAR INTERVENTION Left 06/09/2019   Procedure: PERIPHERAL VASCULAR INTERVENTION;  Surgeon: Elam Dutch, MD;  Location: [place] [facility];  Service: Cardiovascular;  Laterality: Left;  Common Iliac vein   PERIPHERAL VASCULAR THROMBECTOMY Left 06/09/2019   Procedure: PERIPHERAL VASCULAR THROMBECTOMY;  Surgeon: Elam Dutch, MD;  Location: [place] [facility];  Service: Cardiovascular;  Laterality: Left;   TUBAL LIGATION Bilateral    Patient Active Problem List   Diagnosis Date Noted   Right carpal tunnel syndrome 08/21/2022   Trigger middle finger of right  hand 08/21/2022   Mallet finger of right hand 08/18/2022   Nonintractable chronic migraine 02/12/2022   Dyslipidemia 07/02/2021   Chronic pain syndrome 07/02/2021   History of blood clots 07/02/2021   Erosive osteoarthritis of multiple sites 07/02/2021   Primary osteoarthritis of right hip 04/08/2017   History of hip replacement 03/25/2017   Primary osteoarthritis of left hip 12/22/2016   Major depressive disorder 10/24/2012   Headache(784.0) 05/13/2010   Microalbuminuria 04/16/2009   Left hip pain 11/22/2008   Bipolar 1 disorder, depressed ([place]) 05/17/2007   Lumbosacral degenerative disk disease 03/23/2005   Hypertension 08/18/2003    PCP: Horald Pollen, MD  REFERRING PROVIDER: Marybelle Killings, MD  REFERRING DIAG: Chronic bilateral low back pain with bilateral sciatica  Rationale for Evaluation and Treatment: Rehabilitation  THERAPY DIAG:  No diagnosis found.  ONSET DATE: ***   SUBJECTIVE:  SUBJECTIVE STATEMENT: ***  PERTINENT HISTORY:  Previous L4-5 surgery in 2011  PAIN:  Are you having pain? Yes:  NPRS scale: ***/10 Pain location: *** Pain description: *** Aggravating factors: *** Relieving factors: ***  PRECAUTIONS: None  WEIGHT BEARING RESTRICTIONS: No  FALLS:  Has patient fallen in last 6 months? {fallsyesno:27318}  OCCUPATION: ***  PLOF: Independent  PATIENT GOALS: ***   OBJECTIVE:  DIAGNOSTIC FINDINGS:  ***  PATIENT SURVEYS:  FOTO ***  SCREENING FOR RED FLAGS: Negative  COGNITION: Overall cognitive status: Within functional limits for tasks assessed     SENSATION: WFL  MUSCLE LENGTH: ***  POSTURE:   ***  PALPATION: ***  LUMBAR ROM:   AROM eval  Flexion   Extension   Right lateral flexion   Left lateral flexion   Right rotation    Left rotation    (Blank rows = not tested)  LOWER EXTREMITY ROM:     {AROM/PROM:27142}  Right eval Left eval  Hip flexion    Hip extension    Hip abduction    Hip adduction    Hip internal rotation    Hip external rotation    Knee flexion    Knee extension    Ankle dorsiflexion    Ankle plantarflexion    Ankle inversion    Ankle eversion     (Blank rows = not tested)  LOWER EXTREMITY MMT:    MMT Right eval Left eval  Hip flexion    Hip extension    Hip abduction    Hip adduction    Hip internal rotation    Hip external rotation    Knee flexion    Knee extension    Ankle dorsiflexion    Ankle plantarflexion    Ankle inversion    Ankle eversion     (Blank rows = not tested)  LUMBAR SPECIAL TESTS:  {lumbar special test:25242}  FUNCTIONAL TESTS:  {Functional tests:24029}  GAIT: Distance walked: *** Assistive device utilized: {Assistive devices:23999} Level of assistance: {Levels of assistance:24026} Comments: ***   TODAY'S TREATMENT:    [facility] Adult PT Treatment:                                                DATE: 10/06/2022 Therapeutic Exercise: ***  PATIENT EDUCATION:  Education details: Exam findings Person educated: Patient Education method: Explanation, Demonstration, Tactile cues, Verbal cues, and Handouts Education comprehension: verbalized understanding, returned demonstration, verbal cues required, tactile cues required, and needs further education  HOME EXERCISE PROGRAM: ***   ASSESSMENT: CLINICAL IMPRESSION: Patient is a 52 y.o. female who was seen today for physical therapy evaluation and treatment for ***.   OBJECTIVE IMPAIRMENTS: {opptimpairments:25111}.   ACTIVITY LIMITATIONS: {activitylimitations:27494}  PARTICIPATION LIMITATIONS: {participationrestrictions:25113}  PERSONAL FACTORS: {Personal factors:25162} are also affecting patient's functional outcome.   REHAB POTENTIAL: {rehabpotential:25112}  CLINICAL DECISION MAKING:  {clinical decision making:25114}  EVALUATION COMPLEXITY: {Evaluation complexity:25115}   GOALS: Goals reviewed with patient? Yes  SHORT TERM GOALS: Target date: ***  Patient will be I with initial HEP in order to progress with therapy. Baseline: HEP provided at eval Goal status: INITIAL  2.  PT will review FOTO with patient by 3rd visit in order to understand expected progress and outcome with therapy. Baseline: FOTO assessed at eval Goal status: INITIAL  3.  *** Baseline:  Goal status: INITIAL  LONG TERM GOALS:  Target date: ***  Patient will be I with final HEP to maintain progress from PT. Baseline: HEP provided at eval Goal status: {GOALSTATUS:25110}  2.  Patient will report >/= ***% status on FOTO to indicate improved functional ability. Baseline:  Goal status: INITIAL  3.  *** Baseline:  Goal status: INITIAL  4.  *** Baseline:  Goal status: INITIAL   PLAN: PT FREQUENCY: {rehab frequency:25116}  PT DURATION: {rehab duration:25117}  PLANNED INTERVENTIONS: {rehab planned interventions:25118::"Therapeutic exercises","Therapeutic activity","Neuromuscular re-education","Balance training","Gait training","Patient/Family education","Self Care","Joint mobilization"}.  PLAN FOR NEXT SESSION: Review HEP and progress PRN, ***   Hilda Blades, PT, DPT, LAT, ATC 10/06/22  8:18 AM Phone: [phone] Fax: [phone]

## 2022-10-12 ENCOUNTER — Ambulatory Visit: Payer: 59 | Admitting: Psychiatry

## 2022-10-12 ENCOUNTER — Telehealth: Payer: Self-pay | Admitting: Psychiatry

## 2022-10-12 NOTE — Telephone Encounter (Signed)
Unable to reach pt over the phone, sent mychart msg informing pt of need to reschedule 10/12/22 appointment - MD out

## 2022-10-16 ENCOUNTER — Ambulatory Visit: Payer: 59

## 2022-10-22 ENCOUNTER — Encounter: Payer: Self-pay | Admitting: Orthopaedic Surgery

## 2022-10-27 ENCOUNTER — Ambulatory Visit: Payer: 59 | Admitting: Orthopaedic Surgery

## 2022-10-29 ENCOUNTER — Encounter: Payer: Self-pay | Admitting: Orthopaedic Surgery

## 2022-11-03 ENCOUNTER — Encounter: Payer: Self-pay | Admitting: Orthopaedic Surgery

## 2022-11-09 ENCOUNTER — Ambulatory Visit
Admission: RE | Admit: 2022-11-09 | Discharge: 2022-11-09 | Disposition: A | Discharge status: Home / Self Care | Payer: 59 | Source: Ambulatory Visit | Attending: Emergency Medicine | Admitting: Emergency Medicine

## 2022-11-09 DIAGNOSIS — Z1231 Encounter for screening mammogram for malignant neoplasm of breast: Secondary | ICD-10-CM | POA: Diagnosis not present

## 2022-11-17 ENCOUNTER — Ambulatory Visit: Payer: 59 | Admitting: Orthopaedic Surgery

## 2022-12-03 ENCOUNTER — Other Ambulatory Visit: Payer: Self-pay | Admitting: Physician Assistant

## 2022-12-03 MED ORDER — ONDANSETRON HCL 4 MG PO TABS: 4.0000 mg | ORAL_TABLET | Freq: Three times a day (TID) | ORAL | 0 refills | Status: DC | PRN

## 2022-12-03 MED ORDER — HYDROCODONE-ACETAMINOPHEN 5-325 MG PO TABS: 1.0000 | ORAL_TABLET | Freq: Three times a day (TID) | ORAL | 0 refills | Status: DC | PRN

## 2022-12-08 ENCOUNTER — Other Ambulatory Visit: Payer: Self-pay | Admitting: Physician Assistant

## 2022-12-10 ENCOUNTER — Other Ambulatory Visit: Payer: Self-pay | Admitting: Physician Assistant

## 2022-12-10 DIAGNOSIS — G5601 Carpal tunnel syndrome, right upper limb: Secondary | ICD-10-CM | POA: Diagnosis not present

## 2022-12-10 DIAGNOSIS — M65331 Trigger finger, right middle finger: Secondary | ICD-10-CM | POA: Diagnosis not present

## 2022-12-10 DIAGNOSIS — M65841 Other synovitis and tenosynovitis, right hand: Secondary | ICD-10-CM | POA: Diagnosis not present

## 2022-12-10 MED ORDER — HYDROCODONE-ACETAMINOPHEN 5-325 MG PO TABS: 1.0000 | ORAL_TABLET | Freq: Three times a day (TID) | ORAL | 0 refills | Status: DC | PRN

## 2022-12-13 ENCOUNTER — Telehealth: Payer: Self-pay

## 2022-12-13 NOTE — Telephone Encounter (Signed)
Pt called about having stitches removed.

## 2022-12-15 ENCOUNTER — Other Ambulatory Visit: Payer: Self-pay | Admitting: Emergency Medicine

## 2022-12-15 ENCOUNTER — Encounter: Payer: Self-pay | Admitting: Emergency Medicine

## 2022-12-15 DIAGNOSIS — K644 Residual hemorrhoidal skin tags: Secondary | ICD-10-CM

## 2022-12-15 MED ORDER — HYDROCORTISONE (PERIANAL) 2.5 % EX CREA: 1.0000 | TOPICAL_CREAM | Freq: Two times a day (BID) | CUTANEOUS | 0 refills | Status: DC

## 2022-12-15 NOTE — Telephone Encounter (Signed)
New prescription for [facility] sent to pharmacy of record today.  Thanks.

## 2022-12-18 ENCOUNTER — Ambulatory Visit (INDEPENDENT_AMBULATORY_CARE_PROVIDER_SITE_OTHER): Payer: 59 | Admitting: Physician Assistant

## 2022-12-18 DIAGNOSIS — M65331 Trigger finger, right middle finger: Secondary | ICD-10-CM

## 2022-12-18 DIAGNOSIS — G5601 Carpal tunnel syndrome, right upper limb: Secondary | ICD-10-CM | POA: Diagnosis not present

## 2022-12-18 MED ORDER — HYDROCODONE-ACETAMINOPHEN 5-325 MG PO TABS: 1.0000 | ORAL_TABLET | Freq: Two times a day (BID) | ORAL | 0 refills | Status: DC | PRN

## 2022-12-18 NOTE — Progress Notes (Signed)
Post-Op Visit Note   Patient: Patricia Davila           Date of Birth: Apr 06, 1971           MRN: [id number] Visit Date: 12/18/2022 PCP: Georgina Quint, MD   Assessment & Plan:  Chief Complaint: No chief complaint on file.  Visit Diagnoses:  1. Right carpal tunnel syndrome   2. Trigger middle finger of right hand     Plan: Patient is a pleasant 52 year old female who comes in today 1 week status post right carpal tunnel release and right long trigger finger release 12/10/2022.  She has been doing okay.  She is taking Norco for the pain.  She denies any paresthesias to the right hand.  Examination of the right hand reveals well-healing surgical incisions with nylon sutures in place.  No evidence of infection or cellulitis.  Angers warm and well-perfused.  She is neurovascular intact distally.  Today, her wounds were cleaned and recovered.  Removable Velcro splint applied for which she will wear at all times for the next week.  No heavy lifting or submerging her hand underwater for 3 more weeks.  She may begin nerve gliding exercises.  Follow-up with [place] next week for suture removal.  Call with concerns or questions.  Follow-Up Instructions: Return in about 1 week (around 12/25/2022).   Orders:  No orders of the defined types were placed in this encounter.  Meds ordered this encounter  Medications   HYDROcodone-acetaminophen (NORCO) 5-325 MG tablet    Sig: Take 1 tablet by mouth 2 (two) times daily as needed.    Dispense:  10 tablet    Refill:  0    Imaging: No new imaging  PMFS History: Patient Active Problem List   Diagnosis Date Noted   Right carpal tunnel syndrome 08/21/2022   Trigger middle finger of right hand 08/21/2022   Mallet finger of right hand 08/18/2022   Nonintractable chronic migraine 02/12/2022   Dyslipidemia 07/02/2021   Chronic pain syndrome 07/02/2021   History of blood clots 07/02/2021   Erosive osteoarthritis of multiple sites 07/02/2021   Primary  osteoarthritis of right hip 04/08/2017   History of hip replacement 03/25/2017   Primary osteoarthritis of left hip 12/22/2016   Major depressive disorder 10/24/2012   Headache(784.0) 05/13/2010   Microalbuminuria 04/16/2009   Left hip pain 11/22/2008   Bipolar 1 disorder, depressed (HCC) 05/17/2007   Lumbosacral degenerative disk disease 03/23/2005   Hypertension 08/18/2003   Past Medical History:  Diagnosis Date   Anemia    low iron during pregnancy   Anxiety    Arthritis    Asthma    as a child   Bipolar disorder (HCC)    Depression    GERD (gastroesophageal reflux disease)    Headache    Hypertension    Left trimalleolar fracture    Pneumonia    as a child   PONV (postoperative nausea and vomiting)    Restless legs     Family History  Problem Relation Age of Onset   Stomach cancer Mother    HIV Mother    Cancer Mother    Colon cancer Sister 73   Breast cancer Neg Hx    Rectal cancer Neg Hx     Past Surgical History:  Procedure Laterality Date   BILATERAL ANTERIOR TOTAL HIP ARTHROPLASTY Bilateral 03/25/2017   Procedure: BILATERAL ANTERIOR TOTAL HIP ARTHROPLASTY;  Surgeon: Tarry Kos, MD;  Location: [facility];  Service: Orthopedics;  Laterality: Bilateral;   CORONARY ULTRASOUND/IVUS N/A 06/09/2019   Procedure: INTRAVASCULAR ULTRASOUND/IVUS;  Surgeon: Sherren Kerns, MD;  Location: [facility];  Service: Cardiovascular;  Laterality: N/A;   DENTAL SURGERY     all teeth extracted   LAMINECTOMY AND MICRODISCECTOMY LUMBAR SPINE   06/27/2010   ORIF ANKLE FRACTURE Left 03/17/2019   Procedure: OPEN REDUCTION INTERNAL FIXATION (ORIF) LEFT ANKLE FRACTURE;  Surgeon: Tarry Kos, MD;  Location: [facility];  Service: Orthopedics;  Laterality: Left;   PERIPHERAL VASCULAR INTERVENTION Left 06/09/2019   Procedure: PERIPHERAL VASCULAR INTERVENTION;  Surgeon: Sherren Kerns, MD;  Location: [facility];  Service: Cardiovascular;  Laterality: Left;   Common Iliac vein   PERIPHERAL VASCULAR THROMBECTOMY Left 06/09/2019   Procedure: PERIPHERAL VASCULAR THROMBECTOMY;  Surgeon: Sherren Kerns, MD;  Location: [facility];  Service: Cardiovascular;  Laterality: Left;   TUBAL LIGATION Bilateral    Social History   Occupational History   Not on file  Tobacco Use   Smoking status: Former    Packs/day: .25    Types: Cigarettes    Passive exposure: Current   Smokeless tobacco: Never  Vaping Use   Vaping Use: Never used  Substance and Sexual Activity   Alcohol use: No   Drug use: No   Sexual activity: Yes    Birth control/protection: Surgical    Comment: BTL

## 2022-12-25 ENCOUNTER — Ambulatory Visit (INDEPENDENT_AMBULATORY_CARE_PROVIDER_SITE_OTHER): Payer: 59 | Admitting: Physician Assistant

## 2022-12-25 ENCOUNTER — Encounter: Payer: Self-pay | Admitting: Physician Assistant

## 2022-12-25 DIAGNOSIS — G5601 Carpal tunnel syndrome, right upper limb: Secondary | ICD-10-CM

## 2022-12-25 DIAGNOSIS — M65331 Trigger finger, right middle finger: Secondary | ICD-10-CM

## 2022-12-25 MED ORDER — HYDROCODONE-ACETAMINOPHEN 5-325 MG PO TABS: 1.0000 | ORAL_TABLET | Freq: Two times a day (BID) | ORAL | 0 refills | Status: DC | PRN

## 2022-12-25 NOTE — Progress Notes (Signed)
Post-Op Visit Note   Patient: Patricia Davila           Date of Birth: 1971-08-12           MRN: [id number] Visit Date: 12/25/2022 PCP: Georgina Quint, MD   Assessment & Plan:  Chief Complaint:  Chief Complaint  Patient presents with   Right Hand - Follow-up    Right carpal tunnel release and long trigger finger release 12/10/2022   Visit Diagnoses:  1. Right carpal tunnel syndrome   2. Trigger middle finger of right hand     Plan: Patient is a pleasant 52 year old female who comes in today 2 weeks status post right carpal tunnel release and right long trigger finger release, date of surgery 12/10/2022.  She has been doing well.  She is taking an occasional Norco for pain.  She denies any paresthesias.  Examination of the right hand reveals well-healed surgical incisions with nylon sutures in place.  No evidence of infection or cellulitis.  Fingers warm well-perfused.  She is neurovascular intact distally.  Today, sutures were removed and Steri-Strips applied.  She will avoid any heavy lifting or submerging her hand underwater for another 2 weeks.  I have sent in 1 more refill of Norco.  She will follow-up in 2 weeks for recheck.  Call with concerns or questions.  Follow-Up Instructions: Return in about 2 weeks (around 01/08/2023).   Orders:  No orders of the defined types were placed in this encounter.  No orders of the defined types were placed in this encounter.   Imaging: No new imaging  PMFS History: Patient Active Problem List   Diagnosis Date Noted   Right carpal tunnel syndrome 08/21/2022   Trigger middle finger of right hand 08/21/2022   Mallet finger of right hand 08/18/2022   Nonintractable chronic migraine 02/12/2022   Dyslipidemia 07/02/2021   Chronic pain syndrome 07/02/2021   History of blood clots 07/02/2021   Erosive osteoarthritis of multiple sites 07/02/2021   Primary osteoarthritis of right hip 04/08/2017   History of hip replacement 03/25/2017    Primary osteoarthritis of left hip 12/22/2016   Major depressive disorder 10/24/2012   Headache(784.0) 05/13/2010   Microalbuminuria 04/16/2009   Left hip pain 11/22/2008   Bipolar 1 disorder, depressed (HCC) 05/17/2007   Lumbosacral degenerative disk disease 03/23/2005   Hypertension 08/18/2003   Past Medical History:  Diagnosis Date   Anemia    low iron during pregnancy   Anxiety    Arthritis    Asthma    as a child   Bipolar disorder (HCC)    Depression    GERD (gastroesophageal reflux disease)    Headache    Hypertension    Left trimalleolar fracture    Pneumonia    as a child   PONV (postoperative nausea and vomiting)    Restless legs     Family History  Problem Relation Age of Onset   Stomach cancer Mother    HIV Mother    Cancer Mother    Colon cancer Sister 96   Breast cancer Neg Hx    Rectal cancer Neg Hx     Past Surgical History:  Procedure Laterality Date   BILATERAL ANTERIOR TOTAL HIP ARTHROPLASTY Bilateral 03/25/2017   Procedure: BILATERAL ANTERIOR TOTAL HIP ARTHROPLASTY;  Surgeon: Tarry Kos, MD;  Location: [facility];  Service: Orthopedics;  Laterality: Bilateral;   CORONARY ULTRASOUND/IVUS N/A 06/09/2019   Procedure: INTRAVASCULAR ULTRASOUND/IVUS;  Surgeon: Sherren Kerns, MD;  Location: [facility];  Service: Cardiovascular;  Laterality: N/A;   DENTAL SURGERY     all teeth extracted   LAMINECTOMY AND MICRODISCECTOMY LUMBAR SPINE   06/27/2010   ORIF ANKLE FRACTURE Left 03/17/2019   Procedure: OPEN REDUCTION INTERNAL FIXATION (ORIF) LEFT ANKLE FRACTURE;  Surgeon: Tarry Kos, MD;  Location: [facility];  Service: Orthopedics;  Laterality: Left;   PERIPHERAL VASCULAR INTERVENTION Left 06/09/2019   Procedure: PERIPHERAL VASCULAR INTERVENTION;  Surgeon: Sherren Kerns, MD;  Location: [facility] IN[facility]AS[facility];  Service: Cardiovascular;  Laterality: Left;  Common Iliac vein   PERIPHERAL VASCULAR THROMBECTOMY Left 06/09/2019   Procedure:  PERIPHERAL VASCULAR THROMBECTOMY;  Surgeon: Sherren Kerns, MD;  Location: [facility];  Service: Cardiovascular;  Laterality: Left;   TUBAL LIGATION Bilateral    Social History   Occupational History   Not on file  Tobacco Use   Smoking status: Former    Packs/day: .25    Types: Cigarettes    Passive exposure: Current   Smokeless tobacco: Never  Vaping Use   Vaping Use: Never used  Substance and Sexual Activity   Alcohol use: No   Drug use: No   Sexual activity: Yes    Birth control/protection: Surgical    Comment: BTL

## 2023-01-08 ENCOUNTER — Ambulatory Visit (INDEPENDENT_AMBULATORY_CARE_PROVIDER_SITE_OTHER): Payer: 59 | Admitting: Orthopaedic Surgery

## 2023-01-08 ENCOUNTER — Encounter: Payer: Self-pay | Admitting: Orthopaedic Surgery

## 2023-01-08 DIAGNOSIS — M65331 Trigger finger, right middle finger: Secondary | ICD-10-CM

## 2023-01-08 DIAGNOSIS — G5601 Carpal tunnel syndrome, right upper limb: Secondary | ICD-10-CM

## 2023-01-08 MED ORDER — TRAMADOL HCL 50 MG PO TABS: 50.0000 mg | ORAL_TABLET | Freq: Every day | ORAL | 0 refills | Status: DC | PRN

## 2023-01-08 NOTE — Progress Notes (Signed)
Post-Op Visit Note   Patient: Patricia Davila           Date of Birth: Dec 21, 1970           MRN: [id number] Visit Date: 01/08/2023 PCP: Georgina Quint, MD   Assessment & Plan:  Chief Complaint:  Chief Complaint  Patient presents with   Right Wrist - Follow-up    right carpal tunnel release and right long trigger finger release 12/10/2022   Visit Diagnoses:  1. Right carpal tunnel syndrome   2. Trigger middle finger of right hand     Plan: Archie Patten is 4 weeks status post right carpal tunnel release and right middle trigger finger release.  She is doing well overall.  No real complaints.  Examination of the right hand shows fully healed surgical scars with some dry skin.  No signs of infection.  No neurovascular compromise.  She has been able to make a full composite fist.  At this point she can increase activity as tolerated to the right hand.  Total hip implant card was provided.  Follow-up as needed.  Small supply of tramadol refilled.  Follow-Up Instructions: No follow-ups on file.   Orders:  No orders of the defined types were placed in this encounter.  No orders of the defined types were placed in this encounter.   Imaging: No results found.  PMFS History: Patient Active Problem List   Diagnosis Date Noted   Right carpal tunnel syndrome 08/21/2022   Trigger middle finger of right hand 08/21/2022   Mallet finger of right hand 08/18/2022   Nonintractable chronic migraine 02/12/2022   Dyslipidemia 07/02/2021   Chronic pain syndrome 07/02/2021   History of blood clots 07/02/2021   Erosive osteoarthritis of multiple sites 07/02/2021   Primary osteoarthritis of right hip 04/08/2017   History of hip replacement 03/25/2017   Primary osteoarthritis of left hip 12/22/2016   Major depressive disorder 10/24/2012   Headache(784.0) 05/13/2010   Microalbuminuria 04/16/2009   Left hip pain 11/22/2008   Bipolar 1 disorder, depressed (HCC) 05/17/2007   Lumbosacral  degenerative disk disease 03/23/2005   Hypertension 08/18/2003   Past Medical History:  Diagnosis Date   Anemia    low iron during pregnancy   Anxiety    Arthritis    Asthma    as a child   Bipolar disorder (HCC)    Depression    GERD (gastroesophageal reflux disease)    Headache    Hypertension    Left trimalleolar fracture    Pneumonia    as a child   PONV (postoperative nausea and vomiting)    Restless legs     Family History  Problem Relation Age of Onset   Stomach cancer Mother    HIV Mother    Cancer Mother    Colon cancer Sister 71   Breast cancer Neg Hx    Rectal cancer Neg Hx     Past Surgical History:  Procedure Laterality Date   BILATERAL ANTERIOR TOTAL HIP ARTHROPLASTY Bilateral 03/25/2017   Procedure: BILATERAL ANTERIOR TOTAL HIP ARTHROPLASTY;  Surgeon: Tarry Kos, MD;  Location: [facility];  Service: Orthopedics;  Laterality: Bilateral;   CORONARY ULTRASOUND/IVUS N/A 06/09/2019   Procedure: INTRAVASCULAR ULTRASOUND/IVUS;  Surgeon: Sherren Kerns, MD;  Location: [facility];  Service: Cardiovascular;  Laterality: N/A;   DENTAL SURGERY     all teeth extracted   LAMINECTOMY AND MICRODISCECTOMY LUMBAR SPINE   06/27/2010   ORIF ANKLE FRACTURE Left 03/17/2019  Procedure: OPEN REDUCTION INTERNAL FIXATION (ORIF) LEFT ANKLE FRACTURE;  Surgeon: Tarry Kos, MD;  Location: [facility];  Service: Orthopedics;  Laterality: Left;   PERIPHERAL VASCULAR INTERVENTION Left 06/09/2019   Procedure: PERIPHERAL VASCULAR INTERVENTION;  Surgeon: Sherren Kerns, MD;  Location: [facility] IN[facility]AS[facility];  Service: Cardiovascular;  Laterality: Left;  Common Iliac vein   PERIPHERAL VASCULAR THROMBECTOMY Left 06/09/2019   Procedure: PERIPHERAL VASCULAR THROMBECTOMY;  Surgeon: Sherren Kerns, MD;  Location: [facility];  Service: Cardiovascular;  Laterality: Left;   TUBAL LIGATION Bilateral    Social History   Occupational History   Not on file  Tobacco  Use   Smoking status: Former    Packs/day: .25    Types: Cigarettes    Passive exposure: Current   Smokeless tobacco: Never  Vaping Use   Vaping Use: Never used  Substance and Sexual Activity   Alcohol use: No   Drug use: No   Sexual activity: Yes    Birth control/protection: Surgical    Comment: BTL

## 2023-01-18 ENCOUNTER — Encounter: Payer: Self-pay | Admitting: Emergency Medicine

## 2023-01-20 ENCOUNTER — Ambulatory Visit: Payer: 59 | Admitting: Emergency Medicine

## 2023-02-02 ENCOUNTER — Ambulatory Visit: Payer: 59 | Admitting: Emergency Medicine

## 2023-03-10 ENCOUNTER — Ambulatory Visit (INDEPENDENT_AMBULATORY_CARE_PROVIDER_SITE_OTHER): Payer: 59

## 2023-03-10 ENCOUNTER — Ambulatory Visit (INDEPENDENT_AMBULATORY_CARE_PROVIDER_SITE_OTHER): Payer: 59 | Admitting: Emergency Medicine

## 2023-03-10 ENCOUNTER — Encounter: Payer: Self-pay | Admitting: Emergency Medicine

## 2023-03-10 VITALS — BP 136/70 | HR 70 | Temp 98.6°F | Ht 61.5 in | Wt 200.0 lb

## 2023-03-10 DIAGNOSIS — R634 Abnormal weight loss: Secondary | ICD-10-CM | POA: Insufficient documentation

## 2023-03-10 DIAGNOSIS — I10 Essential (primary) hypertension: Secondary | ICD-10-CM | POA: Diagnosis not present

## 2023-03-10 DIAGNOSIS — G894 Chronic pain syndrome: Secondary | ICD-10-CM

## 2023-03-10 DIAGNOSIS — K2100 Gastro-esophageal reflux disease with esophagitis, without bleeding: Secondary | ICD-10-CM | POA: Diagnosis not present

## 2023-03-10 DIAGNOSIS — R630 Anorexia: Secondary | ICD-10-CM | POA: Insufficient documentation

## 2023-03-10 DIAGNOSIS — Z87891 Personal history of nicotine dependence: Secondary | ICD-10-CM | POA: Diagnosis not present

## 2023-03-10 DIAGNOSIS — E785 Hyperlipidemia, unspecified: Secondary | ICD-10-CM

## 2023-03-10 LAB — CBC WITH DIFFERENTIAL/PLATELET
Basophils Absolute: 0 10*3/uL (ref 0.0–0.1)
Basophils Relative: 0.4 % (ref 0.0–3.0)
Eosinophils Absolute: 0.1 10*3/uL (ref 0.0–0.7)
Eosinophils Relative: 1.1 % (ref 0.0–5.0)
HCT: 40.5 % (ref 36.0–46.0)
Hemoglobin: 13.1 g/dL (ref 12.0–15.0)
Lymphocytes Relative: 34.3 % (ref 12.0–46.0)
Lymphs Abs: 2.8 10*3/uL (ref 0.7–4.0)
MCHC: 32.2 g/dL (ref 30.0–36.0)
MCV: 88.4 fl (ref 78.0–100.0)
Monocytes Absolute: 0.6 10*3/uL (ref 0.1–1.0)
Monocytes Relative: 7 % (ref 3.0–12.0)
Neutro Abs: 4.6 10*3/uL (ref 1.4–7.7)
Neutrophils Relative %: 57.2 % (ref 43.0–77.0)
Platelets: 261 10*3/uL (ref 150.0–400.0)
RBC: 4.59 Mil/uL (ref 3.87–5.11)
RDW: 15.5 % (ref 11.5–15.5)
WBC: 8 10*3/uL (ref 4.0–10.5)

## 2023-03-10 LAB — LIPID PANEL
Cholesterol: 122 mg/dL (ref 0–200)
HDL: 35.5 mg/dL — ABNORMAL LOW (ref >39.00)
LDL Cholesterol: 67 mg/dL (ref 0–99)
NonHDL: 86.03
Total CHOL/HDL Ratio: 3
Triglycerides: 96 mg/dL (ref 0.0–149.0)
VLDL: 19.2 mg/dL (ref 0.0–40.0)

## 2023-03-10 LAB — URINALYSIS
Bilirubin Urine: NEGATIVE
Ketones, ur: NEGATIVE
Leukocytes,Ua: NEGATIVE
Nitrite: NEGATIVE
Specific Gravity, Urine: 1.02 (ref 1.000–1.030)
Total Protein, Urine: NEGATIVE
Urine Glucose: NEGATIVE
Urobilinogen, UA: 0.2 (ref 0.0–1.0)
pH: 6 (ref 5.0–8.0)

## 2023-03-10 LAB — COMPREHENSIVE METABOLIC PANEL
ALT: 24 U/L (ref 0–35)
AST: 21 U/L (ref 0–37)
Albumin: 4.3 g/dL (ref 3.5–5.2)
Alkaline Phosphatase: 91 U/L (ref 39–117)
BUN: 10 mg/dL (ref 6–23)
CO2: 28 mEq/L (ref 19–32)
Calcium: 9.4 mg/dL (ref 8.4–10.5)
Chloride: 105 mEq/L (ref 96–112)
Creatinine, Ser: 0.8 mg/dL (ref 0.40–1.20)
GFR: 84.9 mL/min (ref >60.00)
Glucose, Bld: 94 mg/dL (ref 70–99)
Potassium: 3 mEq/L — ABNORMAL LOW (ref 3.5–5.1)
Sodium: 140 mEq/L (ref 135–145)
Total Bilirubin: 0.5 mg/dL (ref 0.2–1.2)
Total Protein: 7.2 g/dL (ref 6.0–8.3)

## 2023-03-10 LAB — HEMOGLOBIN A1C: Hgb A1c MFr Bld: 5.7 % (ref 4.6–6.5)

## 2023-03-10 LAB — TSH: TSH: 0.51 u[IU]/mL (ref 0.35–5.50)

## 2023-03-10 MED ORDER — AMLODIPINE BESYLATE 10 MG PO TABS: 10.0000 mg | ORAL_TABLET | Freq: Every day | ORAL | 3 refills | Status: DC

## 2023-03-10 MED ORDER — LOSARTAN POTASSIUM 50 MG PO TABS: 50.0000 mg | ORAL_TABLET | Freq: Every day | ORAL | 3 refills | Status: DC

## 2023-03-10 MED ORDER — PANTOPRAZOLE SODIUM 40 MG PO TBEC
40.0000 mg | DELAYED_RELEASE_TABLET | Freq: Every day | ORAL | 3 refills | Status: DC
Start: 2023-03-10 — End: 2023-09-07

## 2023-03-10 NOTE — Progress Notes (Signed)
Lennart Pall 52 y.o.   Chief Complaint  Patient presents with   Medical Management of Chronic Issues    Patient states she is having some stomach pain, poss GERD, acid reflux  Patient states she doesn't have an appetite and she has loss some weight     HISTORY OF PRESENT ILLNESS: This is a 52 y.o. female complaining of loss of appetite with resultant weight loss along with burning epigastric pain for several weeks Has history of GERD.  Not on medication at present time Former smoker.  Recently stopped smoking marijuana. History of depression and chronic pain syndrome. No other complaints or medical concerns today. History of hypertension and dyslipidemia  HPI   Prior to Admission medications   Medication Sig Start Date End Date Taking? Authorizing Provider  aspirin EC 81 MG tablet Take 81 mg by mouth daily. Swallow whole.   Yes [provider]  atorvastatin (LIPITOR) 10 MG tablet TAKE 1 TABLET(10 MG) BY MOUTH DAILY 12/16/21  Yes Claiborne Rigg, NP  benzonatate (TESSALON) 100 MG capsule Take 1 capsule (100 mg total) by mouth every 8 (eight) hours. 05/12/22  Yes Carlisle Beers, FNP  Cholecalciferol (VITAMIN D3) 50 MCG (2000 UT) TABS TAKE 1 TABLET BY MOUTH EVERY DAY 08/14/19  Yes Grayce Sessions, NP  cyclobenzaprine (FLEXERIL) 5 MG tablet Take 1 tablet (5 mg total) by mouth 3 (three) times daily as needed for muscle spasms. 08/18/22  Yes Shayaan Parke, Eilleen Kempf, MD  DULoxetine (CYMBALTA) 30 MG capsule Take 1 capsule (30 mg total) by mouth daily. X 1 week, then 60 mg daily- for nerve and back pain 10/13/21  Yes Lovorn, Megan, MD  Galcanezumab-gnlm (EMGALITY) 120 MG/ML SOAJ Inject 120 mg into the skin every 30 (thirty) days. 03/10/22  Yes Ocie Doyne, MD  hydrochlorothiazide (HYDRODIURIL) 12.5 MG tablet Take 1 tablet (12.5 mg total) by mouth daily. 08/26/22  Yes Fadel Clason, Eilleen Kempf, MD  HYDROcodone-acetaminophen [facility]) 5-325 MG tablet Take 1 tablet by mouth 2 (two)  times daily as needed. 12/25/22  Yes Cristie Hem, PA-C  hydrocortisone (ANUSOL-HC) 2.5 % rectal cream Place 1 Application rectally 2 (two) times daily. 12/15/22  Yes Natania Finigan, Eilleen Kempf, MD  lidocaine (XYLOCAINE) 5 % ointment Apply 1 Application topically 4 (four) times daily. Apply with voltaren gel 4x/day to L ankle. Dime sized amount- for burning/tingling. 08/18/22  Yes Shanele Nissan, Eilleen Kempf, MD  Multiple Vitamin (MULTIVITAMIN) tablet Take 1 tablet by mouth daily.   Yes [provider]  ondansetron (ZOFRAN) 4 MG tablet Take 1 tablet (4 mg total) by mouth every 8 (eight) hours as needed for nausea or vomiting. 12/03/22  Yes Cristie Hem, PA-C  pantoprazole (PROTONIX) 40 MG tablet Take 1 tablet (40 mg total) by mouth daily. 03/10/23  Yes Nickolus Wadding, Eilleen Kempf, MD  pregabalin (LYRICA) 100 MG capsule Take 1 capsule (100 mg total) by mouth 3 (three) times daily. Take 1 tab nightly x 1 week, then 100 mg in AM and 200 mg night- for nerve pain 05/01/22  Yes Lovorn, Aundra Millet, MD  traMADol (ULTRAM) 50 MG tablet Take 1-2 tablets (50-100 mg total) by mouth daily as needed. 01/08/23  Yes Tarry Kos, MD  venlafaxine XR (EFFEXOR XR) 37.5 MG 24 hr capsule Take 1 capsule (37.5 mg total) by mouth daily with breakfast. 09/23/21  Yes Ocie Doyne, MD  Vitamin D, Ergocalciferol, (DRISDOL) 1.25 MG (50000 UNIT) CAPS capsule Take 1 capsule (50,000 Units total) by mouth every 7 (seven) days. 05/22/21  Yes Claiborne Rigg, NP  zolmitriptan (ZOMIG) 5 MG tablet Take 1 tablet (5 mg total) by mouth as needed for migraine. May repeat dose in 2 hours if headache persists. Max dose 2 pills in 24 hours 03/10/22  Yes Chima, Victorino Dike, MD  amLODipine (NORVASC) 10 MG tablet Take 1 tablet (10 mg total) by mouth daily. 03/10/23   Georgina Quint, MD  losartan (COZAAR) 50 MG tablet Take 1 tablet (50 mg total) by mouth daily. 03/10/23   Georgina Quint, MD    Allergies  Allergen Reactions   Ativan [Lorazepam]  Anxiety    HALLUCINATIONS   Anesthesia S-I-40 [Propofol]     hallucinations   Ditropan [Oxybutynin]     Anxiety   Tylenol With Codeine #3 [Acetaminophen-Codeine] Hives and Itching    Patient Active Problem List   Diagnosis Date Noted   Right carpal tunnel syndrome 08/21/2022   Trigger middle finger of right hand 08/21/2022   Mallet finger of right hand 08/18/2022   Nonintractable chronic migraine 02/12/2022   Dyslipidemia 07/02/2021   Chronic pain syndrome 07/02/2021   History of blood clots 07/02/2021   Erosive osteoarthritis of multiple sites 07/02/2021   Primary osteoarthritis of right hip 04/08/2017   History of hip replacement 03/25/2017   Primary osteoarthritis of left hip 12/22/2016   Major depressive disorder 10/24/2012   Headache(784.0) 05/13/2010   Microalbuminuria 04/16/2009   Left hip pain 11/22/2008   Bipolar 1 disorder, depressed (HCC) 05/17/2007   Lumbosacral degenerative disk disease 03/23/2005   Hypertension 08/18/2003    Past Medical History:  Diagnosis Date   Anemia    low iron during pregnancy   Anxiety    Arthritis    Asthma    as a child   Bipolar disorder (HCC)    Depression    GERD (gastroesophageal reflux disease)    Headache    Hypertension    Left trimalleolar fracture    Pneumonia    as a child   PONV (postoperative nausea and vomiting)    Restless legs     Past Surgical History:  Procedure Laterality Date   BILATERAL ANTERIOR TOTAL HIP ARTHROPLASTY Bilateral 03/25/2017   Procedure: BILATERAL ANTERIOR TOTAL HIP ARTHROPLASTY;  Surgeon: Tarry Kos, MD;  Location: [facility] OR;  Service: Orthopedics;  Laterality: Bilateral;   CORONARY ULTRASOUND/IVUS N/A 06/09/2019   Procedure: INTRAVASCULAR ULTRASOUND/IVUS;  Surgeon: Sherren Kerns, MD;  Location: [facility] INVASIVE CV LAB;  Service: Cardiovascular;  Laterality: N/A;   DENTAL SURGERY     all teeth extracted   LAMINECTOMY AND MICRODISCECTOMY LUMBAR SPINE   06/27/2010   ORIF ANKLE FRACTURE  Left 03/17/2019   Procedure: OPEN REDUCTION INTERNAL FIXATION (ORIF) LEFT ANKLE FRACTURE;  Surgeon: Tarry Kos, MD;  Location: [facility];  Service: Orthopedics;  Laterality: Left;   PERIPHERAL VASCULAR INTERVENTION Left 06/09/2019   Procedure: PERIPHERAL VASCULAR INTERVENTION;  Surgeon: Sherren Kerns, MD;  Location: [facility]ASIVE CV LAB;  Service: Cardiovascular;  Laterality: Left;  Common Iliac vein   PERIPHERAL VASCULAR THROMBECTOMY Left 06/09/2019   Procedure: PERIPHERAL VASCULAR THROMBECTOMY;  Surgeon: Sherren Kerns, MD;  Location: [facility] INVASIVE CV LAB;  Service: Cardiovascular;  Laterality: Left;   TUBAL LIGATION Bilateral     Social History   Socioeconomic History   Marital status: Married    Spouse name: Not on file   Number of children: Not on file   Years of education: Not on file   Highest education level: 10th grade  Occupational History   Not on file  Tobacco Use   Smoking status: Former    Current packs/day: 0.25    Types: Cigarettes    Passive exposure: Current   Smokeless tobacco: Never  Vaping Use   Vaping status: Never Used  Substance and Sexual Activity   Alcohol use: No   Drug use: No   Sexual activity: Yes    Birth control/protection: Surgical    Comment: BTL  Other Topics Concern   Not on file  Social History Narrative   Not on file   Social Determinants of Health   Financial Resource Strain: Low Risk  (02/01/2023)   Overall Financial Resource Strain (CARDIA)    Difficulty of Paying Living Expenses: Not hard at all  Food Insecurity: No Food Insecurity (02/01/2023)   Hunger Vital Sign    Worried About Running Out of Food in the Last Year: Never true    Ran Out of Food in the Last Year: Never true  Transportation Needs: No Transportation Needs (02/01/2023)   PRAPARE - Administrator, Civil Service (Medical): No    Lack of Transportation (Non-Medical): No  Physical Activity: Sufficiently Active (10/01/2022)   Exercise  Vital Sign    Days of Exercise per Week: 2 days    Minutes of Exercise per Session: 120 min  Stress: No Stress Concern Present (10/01/2022)   [employer] of Occupational Health - Occupational Stress Questionnaire    Feeling of Stress : Not at all  Social Connections: Socially Integrated (02/01/2023)   Social Connection and Isolation Panel [NHANES]    Frequency of Communication with Friends and Family: More than three times a week    Frequency of Social Gatherings with Friends and Family: More than three times a week    Attends Religious Services: More than 4 times per year    Active Member of [employer] or Organizations: Yes    Attends Engineer, structural: More than 4 times per year    Marital Status: Married  Catering manager Violence: Not At Risk (10/01/2022)   Humiliation, Afraid, Rape, and Kick questionnaire    Fear of Current or Ex-Partner: No    Emotionally Abused: No    Physically Abused: No    Sexually Abused: No    Family History  Problem Relation Age of Onset   Stomach cancer Mother    HIV Mother    Cancer Mother    Colon cancer Sister 65   Breast cancer Neg Hx    Rectal cancer Neg Hx      Review of Systems  Constitutional:  Positive for weight loss. Negative for malaise/fatigue.  HENT: Negative.  Negative for congestion and sore throat.   Respiratory: Negative.  Negative for cough and shortness of breath.   Cardiovascular: Negative.  Negative for chest pain and palpitations.  Gastrointestinal:  Negative for abdominal pain, diarrhea, nausea and vomiting.  Genitourinary: Negative.  Negative for dysuria and hematuria.  Neurological: Negative.  Negative for dizziness and headaches.  All other systems reviewed and are negative.   Vitals:   03/10/23 1508  BP: 136/70  Pulse: 70  Temp: 98.6 F (37 C)  SpO2: 98%    Physical Exam Vitals reviewed.  Constitutional:      Appearance: Normal appearance. She is obese.  HENT:     Head: Normocephalic.      Mouth/Throat:     Mouth: Mucous membranes are moist.     Pharynx: Oropharynx is clear.  Eyes:  Extraocular Movements: Extraocular movements intact.     Conjunctiva/sclera: Conjunctivae normal.     Pupils: Pupils are equal, round, and reactive to light.  Cardiovascular:     Rate and Rhythm: Normal rate and regular rhythm.     Pulses: Normal pulses.     Heart sounds: Normal heart sounds.  Pulmonary:     Effort: Pulmonary effort is normal.     Breath sounds: Normal breath sounds.  Abdominal:     Palpations: Abdomen is soft.     Tenderness: There is no abdominal tenderness.  Musculoskeletal:     Cervical back: No tenderness.  Lymphadenopathy:     Cervical: No cervical adenopathy.  Skin:    General: Skin is warm and dry.     Capillary Refill: Capillary refill takes less than 2 seconds.  Neurological:     General: No focal deficit present.     Mental Status: She is alert and oriented to person, place, and time.  Psychiatric:        Mood and Affect: Mood normal.        Behavior: Behavior normal.     DG Chest 2 View  Result Date: 03/10/2023 CLINICAL DATA:  Weight loss. EXAM: CHEST - 2 VIEW COMPARISON:  Dec 27, 2020. FINDINGS: The heart size and mediastinal contours are within normal limits. Both lungs are clear. The visualized skeletal structures are unremarkable. IMPRESSION: No active cardiopulmonary disease. Electronically Signed   By: Lupita Raider M.D.   On: 03/10/2023 16:17    ASSESSMENT & PLAN: A total of 45 minutes was spent with the patient and counseling/coordination of care regarding preparing for this visit, review of most recent office visit notes, review of multiple chronic medical conditions and their management, review of all medications, differential diagnosis for weight loss and decreased appetite and need for workup, diagnosis of GERD and need for GI evaluation and EGD, need to start PPI treatment, education on nutrition, review of chest x-ray report done today,  prognosis, documentation and need for follow-up  Problem List Items Addressed This Visit       Cardiovascular and Mediastinum   Hypertension    BP Readings from Last 3 Encounters:  03/10/23 136/70  09/25/22 134/89  09/04/22 117/77  Well-controlled hypertension. Continue amlodipine 10 mg daily, hydrochlorothiazide 12.5 mg daily, and losartan 50 mg daily Cardiovascular risks associated with hypertension discussed       Relevant Medications   amLODipine (NORVASC) 10 MG tablet   losartan (COZAAR) 50 MG tablet   Other Relevant Orders   CBC with Differential/Platelet   Comprehensive metabolic panel     Digestive   Gastroesophageal reflux disease with esophagitis without hemorrhage - Primary    Presently active and affecting quality of life. Recommend to start pantoprazole 40 mg daily Needs GI evaluation and EGD. Referral placed today.      Relevant Medications   pantoprazole (PROTONIX) 40 MG tablet   Other Relevant Orders   Ambulatory referral to Gastroenterology   CBC with Differential/Platelet   Comprehensive metabolic panel     Other   Dyslipidemia   Relevant Orders   Lipid panel   Chronic pain syndrome    Stable and well-controlled.      Loss of weight    Very concerning given the fact that she is a former smoker. Family history of colon cancer.  Negative colonoscopy in 2023. Wt Readings from Last 3 Encounters:  03/10/23 200 lb (90.7 kg)  10/01/22 215 lb (97.5 kg)  09/25/22 215  lb 8 oz (97.8 kg)  Needs diagnostic workup. Differential diagnosis including cancer discussed. Needs EGD. Chest x-ray done today. Urinalysis looking for hematuria done today Blood work done today       Relevant Orders   Urinalysis   DG Chest 2 View   CBC with Differential/Platelet   Comprehensive metabolic panel   Hemoglobin A1c   Lipid panel   TSH   Decreased appetite    Multifactorial including emotional state Has history of depression Very concerned about her medical  condition Presently on Effexor 37.5 mg daily      Former smoker    Denies cough or difficulty breathing. Chest x-ray done today Report reviewed      Relevant Orders   Urinalysis   DG Chest 2 View   Patient Instructions  Food Choices for Gastroesophageal Reflux Disease, Adult When you have gastroesophageal reflux disease (GERD), the foods you eat and your eating habits are very important. Choosing the right foods can help ease your discomfort. Think about working with a food expert (dietitian) to help you make good choices. What are tips for following this plan? Reading food labels Look for foods that are low in saturated fat. Foods that may help with your symptoms include: Foods that have less than 5% of daily value (DV) of fat. Foods that have 0 grams of trans fat. Cooking Do not fry your food. Cook your food by baking, steaming, grilling, or broiling. These are all methods that do not need a lot of fat for cooking. To add flavor, try to use herbs that are low in spice and acidity. Meal planning  Choose healthy foods that are low in fat, such as: Fruits and vegetables. Whole grains. Low-fat dairy products. Lean meats, fish, and poultry. Eat small meals often instead of eating 3 large meals each day. Eat your meals slowly in a place where you are relaxed. Avoid bending over or lying down until 2-3 hours after eating. Limit high-fat foods such as fatty meats or fried foods. Limit your intake of fatty foods, such as oils, butter, and shortening. Avoid the following as told by your doctor: Foods that cause symptoms. These may be different for different people. Keep a food diary to keep track of foods that cause symptoms. Alcohol. Drinking a lot of liquid with meals. Eating meals during the 2-3 hours before bed. Lifestyle Stay at a healthy weight. Ask your doctor what weight is healthy for you. If you need to lose weight, work with your doctor to do so safely. Exercise for at  least 30 minutes on 5 or more days each week, or as told by your doctor. Wear loose-fitting clothes. Do not smoke or use any products that contain nicotine or tobacco. If you need help quitting, ask your doctor. Sleep with the head of your bed higher than your feet. Use a wedge under the mattress or blocks under the bed frame to raise the head of the bed. Chew sugar-free gum after meals. What foods should eat?  Eat a healthy, well-balanced diet of fruits, vegetables, whole grains, low-fat dairy products, lean meats, fish, and poultry. Each person is different. Foods that may cause symptoms in one person may not cause any symptoms in another person. Work with your doctor to find foods that are safe for you. The items listed above may not be a complete list of what you can eat and drink. Contact a food expert for more options. What foods should I avoid? Limiting some of these  foods may help in managing the symptoms of GERD. Everyone is different. Talk with a food expert or your doctor to help you find the exact foods to avoid, if any. Fruits Any fruits prepared with added fat. Any fruits that cause symptoms. For some people, this may include citrus fruits, such as oranges, grapefruit, pineapple, and lemons. Vegetables Deep-fried vegetables. [place] fries. Any vegetables prepared with added fat. Any vegetables that cause symptoms. For some people, this may include tomatoes and tomato products, chili peppers, onions and garlic, and horseradish. Grains Pastries or quick breads with added fat. Meats and other proteins High-fat meats, such as fatty beef or pork, hot dogs, ribs, ham, sausage, salami, and bacon. Fried meat or protein, including fried fish and fried chicken. Nuts and nut butters, in large amounts. Dairy Whole milk and chocolate milk. Sour cream. Cream. Ice cream. Cream cheese. Milkshakes. Fats and oils Butter. Margarine. Shortening. Ghee. Beverages Coffee and tea, with or without  caffeine. Carbonated beverages. Sodas. Energy drinks. Fruit juice made with acidic fruits, such as orange or grapefruit. Tomato juice. Alcoholic drinks. Sweets and desserts Chocolate and cocoa. Donuts. Seasonings and condiments Pepper. Peppermint and spearmint. Added salt. Any condiments, herbs, or seasonings that cause symptoms. For some people, this may include curry, hot sauce, or vinegar-based salad dressings. The items listed above may not be a complete list of what you should not eat and drink. Contact a food expert for more options. Questions to ask your doctor Diet and lifestyle changes are often the first steps that are taken to manage symptoms of GERD. If diet and lifestyle changes do not help, talk with your doctor about taking medicines. Where to find more information International Foundation for Gastrointestinal Disorders: aboutgerd.org Summary When you have GERD, food and lifestyle choices are very important in easing your symptoms. Eat small meals often instead of 3 large meals a day. Eat your meals slowly and in a place where you are relaxed. Avoid bending over or lying down until 2-3 hours after eating. Limit high-fat foods such as fatty meats or fried foods. This information is not intended to replace advice given to you by your health care provider. Make sure you discuss any questions you have with your health care provider. Document Revised: 02/12/2020 Document Reviewed: 02/12/2020 Elsevier Patient Education  2024 Elsevier Inc.      Edwina Barth, MD [facility]

## 2023-03-10 NOTE — Assessment & Plan Note (Signed)
Multifactorial including emotional state Has history of depression Very concerned about her medical condition Presently on Effexor 37.5 mg daily

## 2023-03-10 NOTE — Assessment & Plan Note (Signed)
Denies cough or difficulty breathing. Chest x-ray done today Report reviewed

## 2023-03-10 NOTE — Patient Instructions (Signed)

## 2023-03-10 NOTE — Assessment & Plan Note (Signed)
BP Readings from Last 3 Encounters:  03/10/23 136/70  09/25/22 134/89  09/04/22 117/77  Well-controlled hypertension. Continue amlodipine 10 mg daily, hydrochlorothiazide 12.5 mg daily, and losartan 50 mg daily Cardiovascular risks associated with hypertension discussed

## 2023-03-10 NOTE — Assessment & Plan Note (Signed)
Stable and well controlled. 

## 2023-03-10 NOTE — Assessment & Plan Note (Signed)
Presently active and affecting quality of life. Recommend to start pantoprazole 40 mg daily Needs GI evaluation and EGD. Referral placed today.

## 2023-03-10 NOTE — Assessment & Plan Note (Signed)
Very concerning given the fact that she is a former smoker. Family history of colon cancer.  Negative colonoscopy in 2023. Wt Readings from Last 3 Encounters:  03/10/23 200 lb (90.7 kg)  10/01/22 215 lb (97.5 kg)  09/25/22 215 lb 8 oz (97.8 kg)  Needs diagnostic workup. Differential diagnosis including cancer discussed. Needs EGD. Chest x-ray done today. Urinalysis looking for hematuria done today Blood work done today

## 2023-03-13 ENCOUNTER — Encounter: Payer: Self-pay | Admitting: Emergency Medicine

## 2023-04-07 ENCOUNTER — Other Ambulatory Visit: Payer: Self-pay | Admitting: Emergency Medicine

## 2023-04-07 DIAGNOSIS — Z760 Encounter for issue of repeat prescription: Secondary | ICD-10-CM

## 2023-05-05 ENCOUNTER — Other Ambulatory Visit (INDEPENDENT_AMBULATORY_CARE_PROVIDER_SITE_OTHER): Payer: Self-pay | Admitting: Emergency Medicine

## 2023-05-05 DIAGNOSIS — E7800 Pure hypercholesterolemia, unspecified: Secondary | ICD-10-CM

## 2023-05-25 ENCOUNTER — Ambulatory Visit: Payer: 59 | Admitting: Emergency Medicine

## 2023-05-31 ENCOUNTER — Telehealth: Payer: Self-pay | Admitting: Emergency Medicine

## 2023-05-31 ENCOUNTER — Ambulatory Visit (INDEPENDENT_AMBULATORY_CARE_PROVIDER_SITE_OTHER): Payer: 59 | Admitting: Emergency Medicine

## 2023-05-31 ENCOUNTER — Encounter: Payer: Self-pay | Admitting: Emergency Medicine

## 2023-05-31 VITALS — BP 132/84 | HR 62 | Temp 98.4°F | Ht 61.5 in | Wt 207.5 lb

## 2023-05-31 DIAGNOSIS — Z760 Encounter for issue of repeat prescription: Secondary | ICD-10-CM | POA: Diagnosis not present

## 2023-05-31 DIAGNOSIS — K644 Residual hemorrhoidal skin tags: Secondary | ICD-10-CM | POA: Insufficient documentation

## 2023-05-31 DIAGNOSIS — Z23 Encounter for immunization: Secondary | ICD-10-CM

## 2023-05-31 DIAGNOSIS — K648 Other hemorrhoids: Secondary | ICD-10-CM | POA: Diagnosis not present

## 2023-05-31 DIAGNOSIS — G894 Chronic pain syndrome: Secondary | ICD-10-CM | POA: Diagnosis not present

## 2023-05-31 DIAGNOSIS — I10 Essential (primary) hypertension: Secondary | ICD-10-CM

## 2023-05-31 MED ORDER — CYCLOBENZAPRINE HCL 5 MG PO TABS: 5.0000 mg | ORAL_TABLET | Freq: Three times a day (TID) | ORAL | 3 refills | Status: DC | PRN

## 2023-05-31 MED ORDER — HYDROCORTISONE ACETATE 25 MG RE SUPP
25.0000 mg | Freq: Two times a day (BID) | RECTAL | 1 refills | Status: DC
Start: 2023-05-31 — End: 2023-09-07

## 2023-05-31 MED ORDER — ZOLMITRIPTAN 5 MG PO TABS: 5.0000 mg | ORAL_TABLET | ORAL | 6 refills | Status: DC | PRN

## 2023-05-31 MED ORDER — VITAMIN D3 50 MCG (2000 UT) PO TABS
1.0000 | ORAL_TABLET | Freq: Every day | ORAL | 2 refills | Status: DC
Start: 2023-05-31 — End: 2023-09-07

## 2023-05-31 NOTE — Progress Notes (Signed)
Patricia Davila 52 y.o.   Chief Complaint  Patient presents with   Medical Management of Chronic Issues    Patient states she has a poss hemorrhoid, patient states she bleeds when she uses the bathroom Concern about her weight loss.    Referral    Patient wants a referral to [facility], previous referral is closed     HISTORY OF PRESENT ILLNESS: This is a 52 y.o. female complaining of painful external hemorrhoids for the past 4 months.  External cream not helping much. Colonoscopy report from 2023 shows medium size internal and external hemorrhoids. No other complaints or medical concerns today.  HPI   Prior to Admission medications   Medication Sig Start Date End Date Taking? Authorizing Provider  amLODipine (NORVASC) 10 MG tablet Take 1 tablet (10 mg total) by mouth daily. 03/10/23  Yes SagardiaEilleen Kempf, MD  aspirin EC 81 MG tablet Take 81 mg by mouth daily. Swallow whole.   Yes [provider]  atorvastatin (LIPITOR) 10 MG tablet TAKE 1 TABLET(10 MG) BY MOUTH DAILY 05/05/23  Yes Georgina Quint, MD  Cholecalciferol (VITAMIN D3) 50 MCG (2000 UT) TABS TAKE 1 TABLET BY MOUTH EVERY DAY 08/14/19  Yes Grayce Sessions, NP  cyclobenzaprine (FLEXERIL) 5 MG tablet Take 1 tablet (5 mg total) by mouth 3 (three) times daily as needed for muscle spasms. 08/18/22  Yes Chace Klippel, Eilleen Kempf, MD  DULoxetine (CYMBALTA) 30 MG capsule Take 1 capsule (30 mg total) by mouth daily. X 1 week, then 60 mg daily- for nerve and back pain 10/13/21  Yes Lovorn, Megan, MD  Galcanezumab-gnlm (EMGALITY) 120 MG/ML SOAJ Inject 120 mg into the skin every 30 (thirty) days. 03/10/22  Yes Ocie Doyne, MD  hydrochlorothiazide (HYDRODIURIL) 12.5 MG tablet Take 1 tablet (12.5 mg total) by mouth daily. 08/26/22  Yes Milen Lengacher, Eilleen Kempf, MD  HYDROcodone-acetaminophen [facility]) 5-325 MG tablet Take 1 tablet by mouth 2 (two) times daily as needed. 12/25/22  Yes Cristie Hem, PA-C  hydrocortisone (ANUSOL-HC)  2.5 % rectal cream Place 1 Application rectally 2 (two) times daily. 12/15/22  Yes Twylah Bennetts, Eilleen Kempf, MD  lidocaine (XYLOCAINE) 5 % ointment Apply 1 Application topically 4 (four) times daily. Apply with voltaren gel 4x/day to L ankle. Dime sized amount- for burning/tingling. 08/18/22  Yes Charman Blasco, Eilleen Kempf, MD  losartan (COZAAR) 50 MG tablet Take 1 tablet (50 mg total) by mouth daily. 03/10/23  Yes Guadalupe Nickless, Eilleen Kempf, MD  Multiple Vitamin (MULTIVITAMIN) tablet Take 1 tablet by mouth daily.   Yes [provider]  ondansetron (ZOFRAN) 4 MG tablet Take 1 tablet (4 mg total) by mouth every 8 (eight) hours as needed for nausea or vomiting. 12/03/22  Yes Cristie Hem, PA-C  pantoprazole (PROTONIX) 40 MG tablet Take 1 tablet (40 mg total) by mouth daily. 03/10/23  Yes Katori Wirsing, Eilleen Kempf, MD  pregabalin (LYRICA) 100 MG capsule Take 1 capsule (100 mg total) by mouth 3 (three) times daily. Take 1 tab nightly x 1 week, then 100 mg in AM and 200 mg night- for nerve pain 05/01/22  Yes Lovorn, Aundra Millet, MD  traMADol (ULTRAM) 50 MG tablet Take 1-2 tablets (50-100 mg total) by mouth daily as needed. 01/08/23  Yes Tarry Kos, MD  venlafaxine XR (EFFEXOR XR) 37.5 MG 24 hr capsule Take 1 capsule (37.5 mg total) by mouth daily with breakfast. 09/23/21  Yes Ocie Doyne, MD  Vitamin D, Ergocalciferol, (DRISDOL) 1.25 MG (50000 UNIT) CAPS capsule Take 1  capsule (50,000 Units total) by mouth every 7 (seven) days. 05/22/21  Yes Claiborne Rigg, NP  zolmitriptan (ZOMIG) 5 MG tablet Take 1 tablet (5 mg total) by mouth as needed for migraine. May repeat dose in 2 hours if headache persists. Max dose 2 pills in 24 hours 03/10/22  Yes Chima, Victorino Dike, MD  benzonatate (TESSALON) 100 MG capsule Take 1 capsule (100 mg total) by mouth every 8 (eight) hours. Patient not taking: Reported on 05/31/2023 05/12/22   Carlisle Beers, FNP    Allergies  Allergen Reactions   Ativan [Lorazepam] Anxiety     HALLUCINATIONS   Anesthesia S-I-40 [Propofol]     hallucinations   Ditropan [Oxybutynin]     Anxiety   Tylenol With Codeine #3 [Acetaminophen-Codeine] Hives and Itching    Patient Active Problem List   Diagnosis Date Noted   Loss of weight 03/10/2023   Gastroesophageal reflux disease with esophagitis without hemorrhage 03/10/2023   Decreased appetite 03/10/2023   Former smoker 03/10/2023   Right carpal tunnel syndrome 08/21/2022   Trigger middle finger of right hand 08/21/2022   Mallet finger of right hand 08/18/2022   Nonintractable chronic migraine 02/12/2022   Dyslipidemia 07/02/2021   Chronic pain syndrome 07/02/2021   History of blood clots 07/02/2021   Erosive osteoarthritis of multiple sites 07/02/2021   Primary osteoarthritis of right hip 04/08/2017   History of hip replacement 03/25/2017   Primary osteoarthritis of left hip 12/22/2016   Major depressive disorder 10/24/2012   Headache(784.0) 05/13/2010   Microalbuminuria 04/16/2009   Left hip pain 11/22/2008   Bipolar 1 disorder, depressed (HCC) 05/17/2007   Lumbosacral degenerative disk disease 03/23/2005   Hypertension 08/18/2003    Past Medical History:  Diagnosis Date   Anemia    low iron during pregnancy   Anxiety    Arthritis    Asthma    as a child   Bipolar disorder (HCC)    Depression    GERD (gastroesophageal reflux disease)    Headache    Hypertension    Left trimalleolar fracture    Pneumonia    as a child   PONV (postoperative nausea and vomiting)    Restless legs     Past Surgical History:  Procedure Laterality Date   BILATERAL ANTERIOR TOTAL HIP ARTHROPLASTY Bilateral 03/25/2017   Procedure: BILATERAL ANTERIOR TOTAL HIP ARTHROPLASTY;  Surgeon: Tarry Kos, MD;  Location: [facility] OR;  Service: Orthopedics;  Laterality: Bilateral;   CORONARY ULTRASOUND/IVUS N/A 06/09/2019   Procedure: INTRAVASCULAR ULTRASOUND/IVUS;  Surgeon: Sherren Kerns, MD;  Location: [facility] INVASIVE CV LAB;  Service:  Cardiovascular;  Laterality: N/A;   DENTAL SURGERY     all teeth extracted   LAMINECTOMY AND MICRODISCECTOMY LUMBAR SPINE   06/27/2010   ORIF ANKLE FRACTURE Left 03/17/2019   Procedure: OPEN REDUCTION INTERNAL FIXATION (ORIF) LEFT ANKLE FRACTURE;  Surgeon: Tarry Kos, MD;  Location: [facility];  Service: Orthopedics;  Laterality: Left;   PERIPHERAL VASCULAR INTERVENTION Left 06/09/2019   Procedure: PERIPHERAL VASCULAR INTERVENTION;  Surgeon: Sherren Kerns, MD;  Location: [facility] INVASIVE CV LAB;  Service: Cardiovascular;  Laterality: Left;  Common Iliac vein   PERIPHERAL VASCULAR THROMBECTOMY Left 06/09/2019   Procedure: PERIPHERAL VASCULAR THROMBECTOMY;  Surgeon: Sherren Kerns, MD;  Location: [facility] INVASIVE CV LAB;  Service: Cardiovascular;  Laterality: Left;   TUBAL LIGATION Bilateral     Social History   Socioeconomic History   Marital status: Married    Spouse name: Not  on file   Number of children: Not on file   Years of education: Not on file   Highest education level: 10th grade  Occupational History   Not on file  Tobacco Use   Smoking status: Former    Current packs/day: 0.25    Types: Cigarettes    Passive exposure: Current   Smokeless tobacco: Never  Vaping Use   Vaping status: Never Used  Substance and Sexual Activity   Alcohol use: No   Drug use: No   Sexual activity: Yes    Birth control/protection: Surgical    Comment: BTL  Other Topics Concern   Not on file  Social History Narrative   Not on file   Social Determinants of Health   Financial Resource Strain: Low Risk  (05/31/2023)   Overall Financial Resource Strain (CARDIA)    Difficulty of Paying Living Expenses: Not hard at all  Food Insecurity: No Food Insecurity (05/31/2023)   Hunger Vital Sign    Worried About Running Out of Food in the Last Year: Never true    Ran Out of Food in the Last Year: Never true  Transportation Needs: No Transportation Needs (05/31/2023)   PRAPARE -  Administrator, Civil Service (Medical): No    Lack of Transportation (Non-Medical): No  Physical Activity: Sufficiently Active (05/31/2023)   Exercise Vital Sign    Days of Exercise per Week: 7 days    Minutes of Exercise per Session: 60 min  Stress: No Stress Concern Present (05/31/2023)   [employer] of Occupational Health - Occupational Stress Questionnaire    Feeling of Stress : Not at all  Social Connections: Socially Integrated (05/31/2023)   Social Connection and Isolation Panel [NHANES]    Frequency of Communication with Friends and Family: More than three times a week    Frequency of Social Gatherings with Friends and Family: Twice a week    Attends Religious Services: More than 4 times per year    Active Member of [employer] or Organizations: Yes    Attends Engineer, structural: More than 4 times per year    Marital Status: Married  Catering manager Violence: Not At Risk (10/01/2022)   Humiliation, Afraid, Rape, and Kick questionnaire    Fear of Current or Ex-Partner: No    Emotionally Abused: No    Physically Abused: No    Sexually Abused: No    Family History  Problem Relation Age of Onset   Stomach cancer Mother    HIV Mother    Cancer Mother    Colon cancer Sister 103   Breast cancer Neg Hx    Rectal cancer Neg Hx      Review of Systems  Constitutional: Negative.  Negative for chills and fever.  HENT: Negative.  Negative for congestion and sore throat.   Respiratory: Negative.  Negative for cough and shortness of breath.   Cardiovascular: Negative.  Negative for chest pain and palpitations.  Gastrointestinal:  Positive for blood in stool (Only when wiping). Negative for abdominal pain, diarrhea, nausea and vomiting.  Genitourinary: Negative.  Negative for dysuria and hematuria.  Musculoskeletal: Negative.   Skin: Negative.  Negative for rash.  Neurological: Negative.  Negative for dizziness and headaches.  All other systems reviewed and  are negative.   Vitals:   05/31/23 1030  BP: 132/84  Pulse: 62  Temp: 98.4 F (36.9 C)  SpO2: 97%    Physical Exam Vitals reviewed. Exam conducted with a chaperone present.  Constitutional:      Appearance: Normal appearance. She is obese.  Eyes:     Extraocular Movements: Extraocular movements intact.  Cardiovascular:     Rate and Rhythm: Normal rate.  Pulmonary:     Effort: Pulmonary effort is normal.  Abdominal:     Palpations: Abdomen is soft.     Tenderness: There is no abdominal tenderness.  Genitourinary:    Rectum: External hemorrhoid (No thrombosis or bleeding) present. No anal fissure.  Skin:    General: Skin is warm and dry.     Capillary Refill: Capillary refill takes less than 2 seconds.  Neurological:     General: No focal deficit present.     Mental Status: She is alert and oriented to person, place, and time.  Psychiatric:        Mood and Affect: Mood normal.        Behavior: Behavior normal.      ASSESSMENT & PLAN: A total of 42 minutes was spent with the patient and counseling/coordination of care regarding preparing for this visit, review of most recent office visit notes, review of multiple chronic medical conditions under management, review of all medications, diagnosis of internal and external hemorrhoids and need for colorectal evaluation, pain management, education on nutrition and need to increase amount of daily fiber intake, prognosis, documentation and need for follow-up.  Problem List Items Addressed This Visit       Cardiovascular and Mediastinum   Hypertension    BP Readings from Last 3 Encounters:  05/31/23 132/84  03/10/23 136/70  09/25/22 134/89  Well-controlled hypertension Continue amlodipine 10 mg daily and hydrochlorothiazide 12.5 mg daily with losartan 50 mg daily       External hemorrhoids - Primary    Active and affecting quality of life External measures to relieve pain discussed Recommend also Anusol HC  suppositories Diet and nutrition discussed.  Advised to increase amount of daily fiber intake Recommend colorectal surgery evaluation Referral placed today.      Relevant Medications   hydrocortisone (ANUSOL-HC) 25 MG suppository   Other Relevant Orders   Ambulatory referral to Colorectal Surgery   Internal hemorrhoids    Contributing to symptoms No active bleeding at present time Diet and nutrition discussed Recommend Anusol HC suppositories Needs colorectal surgery evaluation Referral placed today      Relevant Medications   hydrocortisone (ANUSOL-HC) 25 MG suppository   Other Relevant Orders   Ambulatory referral to Colorectal Surgery     Other   Chronic pain syndrome   Relevant Medications   cyclobenzaprine (FLEXERIL) 5 MG tablet   Other Visit Diagnoses     Medication refill       Relevant Medications   Cholecalciferol (VITAMIN D3) 50 MCG (2000 UT) TABS   Need for vaccination       Relevant Orders   Flu vaccine trivalent PF, 6mos and older(Flulaval,Afluria,Fluarix,Fluzone) (Completed)      Patient Instructions  Hemorrhoids Hemorrhoids are swollen veins that may form: In the butt (rectum). These are called internal hemorrhoids. Around the opening of the butt (anus). These are called external hemorrhoids. Most hemorrhoids do not cause very bad problems. They often get better with changes to your lifestyle and what you eat. What are the causes? Having trouble pooping (constipation) or watery poop (diarrhea). Pushing too hard when you poop. Pregnancy. Being very overweight (obese). Sitting for too long. Riding a bike for a long time. Heavy lifting or other things that take a lot of effort. Anal sex.  What are the signs or symptoms? Pain. Itching or soreness in the butt. Bleeding from the butt. Leaking poop. Swelling. One or more lumps around the opening of your butt. How is this treated? In most cases, hemorrhoids can be treated at home. You may be told  to: Change what you eat. Make changes to your lifestyle. If these treatments do not help, you may need to have a procedure done. Your doctor may need to: Place rubber bands at the bottom of the hemorrhoids to make them fall off. Put medicine into the hemorrhoids to shrink them. Shine a type of light on the hemorrhoids to cause them to fall off. Do surgery to get rid of the hemorrhoids. Follow these instructions at home: Medicines Take over-the-counter and prescription medicines only as told by your doctor. Use creams with medicine in them or medicines that you put in your butt as told by your doctor. Eating and drinking  Eat foods that have a lot of fiber in them. These include whole grains, beans, nuts, fruits, and vegetables. Ask your doctor about taking products that have fiber added to them (fibersupplements). Take in less fat. You can do this by: Eating low-fat dairy products. Eating less red meat. Staying away from processed foods. Drink enough fluid to keep your pee (urine) pale yellow. Managing pain and swelling  Take a warm-water bath (sitz bath) for 20 minutes to ease pain. Do this 3-4 times a day. You may do this in a bathtub. You may also use a portable sitz bath that fits over the toilet. If told, put ice on the painful area. It may help to use ice between your warm baths. Put ice in a plastic bag. Place a towel between your skin and the bag. Leave the ice on for 20 minutes, 2-3 times a day. If your skin turns bright red, take off the ice right away to prevent skin damage. The risk of damage is higher if you cannot feel pain, heat, or cold. General instructions Exercise. Ask your doctor how much and what kind of exercise is best for you. Go to the bathroom when you need to poop. Do not wait. Try not to push too hard when you poop. Keep your butt dry and clean. Use wet toilet paper or moist towelettes after you poop. Do not sit on the toilet for a long time. Contact a  doctor if: You have pain and swelling that do not get better with treatment. You have trouble pooping. You cannot poop. You have pain or swelling outside the area of the hemorrhoids. Get help right away if: You have bleeding from the butt that will not stop. This information is not intended to replace advice given to you by your health care provider. Make sure you discuss any questions you have with your health care provider. Document Revised: 04/15/2022 Document Reviewed: 04/15/2022 Elsevier Patient Education  2024 Elsevier Inc.      Edwina Barth, MD [facility]

## 2023-05-31 NOTE — Telephone Encounter (Signed)
Patient called and said that the suppository was 200.  Please send in something else.  Also states that she needs a prior authorization on the migraine medicine.

## 2023-05-31 NOTE — Telephone Encounter (Signed)
Sent patient a Clinical cytogeneticist message so she can call her pharmacy to get alternatives.

## 2023-05-31 NOTE — Assessment & Plan Note (Signed)
Contributing to symptoms No active bleeding at present time Diet and nutrition discussed Recommend Anusol HC suppositories Needs colorectal surgery evaluation Referral placed today

## 2023-05-31 NOTE — Patient Instructions (Signed)
Hemorrhoids Hemorrhoids are swollen veins that may form: In the butt (rectum). These are called internal hemorrhoids. Around the opening of the butt (anus). These are called external hemorrhoids. Most hemorrhoids do not cause very bad problems. They often get better with changes to your lifestyle and what you eat. What are the causes? Having trouble pooping (constipation) or watery poop (diarrhea). Pushing too hard when you poop. Pregnancy. Being very overweight (obese). Sitting for too long. Riding a bike for a long time. Heavy lifting or other things that take a lot of effort. Anal sex. What are the signs or symptoms? Pain. Itching or soreness in the butt. Bleeding from the butt. Leaking poop. Swelling. One or more lumps around the opening of your butt. How is this treated? In most cases, hemorrhoids can be treated at home. You may be told to: Change what you eat. Make changes to your lifestyle. If these treatments do not help, you may need to have a procedure done. Your doctor may need to: Place rubber bands at the bottom of the hemorrhoids to make them fall off. Put medicine into the hemorrhoids to shrink them. Shine a type of light on the hemorrhoids to cause them to fall off. Do surgery to get rid of the hemorrhoids. Follow these instructions at home: Medicines Take over-the-counter and prescription medicines only as told by your doctor. Use creams with medicine in them or medicines that you put in your butt as told by your doctor. Eating and drinking  Eat foods that have a lot of fiber in them. These include whole grains, beans, nuts, fruits, and vegetables. Ask your doctor about taking products that have fiber added to them (fibersupplements). Take in less fat. You can do this by: Eating low-fat dairy products. Eating less red meat. Staying away from processed foods. Drink enough fluid to keep your pee (urine) pale yellow. Managing pain and swelling  Take a  warm-water bath (sitz bath) for 20 minutes to ease pain. Do this 3-4 times a day. You may do this in a bathtub. You may also use a portable sitz bath that fits over the toilet. If told, put ice on the painful area. It may help to use ice between your warm baths. Put ice in a plastic bag. Place a towel between your skin and the bag. Leave the ice on for 20 minutes, 2-3 times a day. If your skin turns bright red, take off the ice right away to prevent skin damage. The risk of damage is higher if you cannot feel pain, heat, or cold. General instructions Exercise. Ask your doctor how much and what kind of exercise is best for you. Go to the bathroom when you need to poop. Do not wait. Try not to push too hard when you poop. Keep your butt dry and clean. Use wet toilet paper or moist towelettes after you poop. Do not sit on the toilet for a long time. Contact a doctor if: You have pain and swelling that do not get better with treatment. You have trouble pooping. You cannot poop. You have pain or swelling outside the area of the hemorrhoids. Get help right away if: You have bleeding from the butt that will not stop. This information is not intended to replace advice given to you by your health care provider. Make sure you discuss any questions you have with your health care provider. Document Revised: 04/15/2022 Document Reviewed: 04/15/2022 Elsevier Patient Education  2024 [employer].

## 2023-05-31 NOTE — Assessment & Plan Note (Signed)
Active and affecting quality of life External measures to relieve pain discussed Recommend also Anusol HC suppositories Diet and nutrition discussed.  Advised to increase amount of daily fiber intake Recommend colorectal surgery evaluation Referral placed today.

## 2023-05-31 NOTE — Assessment & Plan Note (Signed)
BP Readings from Last 3 Encounters:  05/31/23 132/84  03/10/23 136/70  09/25/22 134/89  Well-controlled hypertension Continue amlodipine 10 mg daily and hydrochlorothiazide 12.5 mg daily with losartan 50 mg daily

## 2023-06-07 ENCOUNTER — Encounter: Payer: Self-pay | Admitting: Emergency Medicine

## 2023-06-29 DIAGNOSIS — K6289 Other specified diseases of anus and rectum: Secondary | ICD-10-CM | POA: Diagnosis not present

## 2023-06-29 DIAGNOSIS — K5900 Constipation, unspecified: Secondary | ICD-10-CM | POA: Diagnosis not present

## 2023-07-02 ENCOUNTER — Other Ambulatory Visit (HOSPITAL_COMMUNITY): Payer: Self-pay

## 2023-07-05 ENCOUNTER — Other Ambulatory Visit (HOSPITAL_COMMUNITY): Payer: Self-pay

## 2023-07-05 ENCOUNTER — Telehealth: Payer: Self-pay

## 2023-07-05 NOTE — Telephone Encounter (Signed)
Pharmacy Patient Advocate Encounter   Received notification from Patient Pharmacy that prior authorization for ZOLMitriptan 5MG  tablets is required/requested.   Insurance verification completed.   The patient is insured through [facility] Medicare Part D .   Per test claim: PA required; PA submitted to above mentioned insurance via CoverMyMeds Key/confirmation #/EOC [id number] Status is pending

## 2023-07-07 NOTE — Telephone Encounter (Signed)
Pharmacy Patient Advocate Encounter  Received notification from [facility] Medicare Part D that Prior Authorization for ZOLMitriptan 5MG  tablets has been DENIED.  Full denial letter will be uploaded to the media tab. See denial reason below.  Medication authorization requires the following:  (1) You need to try this covered drug: Naratriptan; OR (2) Your doctor needs to give [place] specific medical reasons why the covered drug is not appropriate for you  PA #/Case ID/Reference #: [id number]

## 2023-09-07 ENCOUNTER — Encounter: Payer: Self-pay | Admitting: Emergency Medicine

## 2023-09-07 ENCOUNTER — Ambulatory Visit (INDEPENDENT_AMBULATORY_CARE_PROVIDER_SITE_OTHER): Payer: 59 | Admitting: Emergency Medicine

## 2023-09-07 VITALS — BP 126/88 | HR 66 | Temp 98.4°F | Ht 61.5 in | Wt 203.0 lb

## 2023-09-07 DIAGNOSIS — K2100 Gastro-esophageal reflux disease with esophagitis, without bleeding: Secondary | ICD-10-CM

## 2023-09-07 DIAGNOSIS — Z1329 Encounter for screening for other suspected endocrine disorder: Secondary | ICD-10-CM

## 2023-09-07 DIAGNOSIS — Z0000 Encounter for general adult medical examination without abnormal findings: Secondary | ICD-10-CM

## 2023-09-07 DIAGNOSIS — E785 Hyperlipidemia, unspecified: Secondary | ICD-10-CM | POA: Diagnosis not present

## 2023-09-07 DIAGNOSIS — I10 Essential (primary) hypertension: Secondary | ICD-10-CM

## 2023-09-07 DIAGNOSIS — M154 Erosive (osteo)arthritis: Secondary | ICD-10-CM

## 2023-09-07 DIAGNOSIS — G894 Chronic pain syndrome: Secondary | ICD-10-CM

## 2023-09-07 DIAGNOSIS — Z13228 Encounter for screening for other metabolic disorders: Secondary | ICD-10-CM

## 2023-09-07 DIAGNOSIS — Z130 Encounter for screening for diseases of the blood and blood-forming organs and certain disorders involving the immune mechanism: Secondary | ICD-10-CM | POA: Diagnosis not present

## 2023-09-07 DIAGNOSIS — R1319 Other dysphagia: Secondary | ICD-10-CM | POA: Diagnosis not present

## 2023-09-07 DIAGNOSIS — Z0001 Encounter for general adult medical examination with abnormal findings: Secondary | ICD-10-CM

## 2023-09-07 LAB — CBC WITH DIFFERENTIAL/PLATELET
Basophils Absolute: 0.1 10*3/uL (ref 0.0–0.1)
Basophils Relative: 0.7 % (ref 0.0–3.0)
Eosinophils Absolute: 0.1 10*3/uL (ref 0.0–0.7)
Eosinophils Relative: 0.9 % (ref 0.0–5.0)
HCT: 39.7 % (ref 36.0–46.0)
Hemoglobin: 13.2 g/dL (ref 12.0–15.0)
Lymphocytes Relative: 32.2 % (ref 12.0–46.0)
Lymphs Abs: 2.7 10*3/uL (ref 0.7–4.0)
MCHC: 33.2 g/dL (ref 30.0–36.0)
MCV: 90.3 fL (ref 78.0–100.0)
Monocytes Absolute: 0.4 10*3/uL (ref 0.1–1.0)
Monocytes Relative: 4.7 % (ref 3.0–12.0)
Neutro Abs: 5.2 10*3/uL (ref 1.4–7.7)
Neutrophils Relative %: 61.5 % (ref 43.0–77.0)
Platelets: 276 10*3/uL (ref 150.0–400.0)
RBC: 4.39 Mil/uL (ref 3.87–5.11)
RDW: 15.4 % (ref 11.5–15.5)
WBC: 8.4 10*3/uL (ref 4.0–10.5)

## 2023-09-07 LAB — COMPREHENSIVE METABOLIC PANEL
ALT: 17 U/L (ref 0–35)
AST: 15 U/L (ref 0–37)
Albumin: 4.4 g/dL (ref 3.5–5.2)
Alkaline Phosphatase: 97 U/L (ref 39–117)
BUN: 10 mg/dL (ref 6–23)
CO2: 31 meq/L (ref 19–32)
Calcium: 9.4 mg/dL (ref 8.4–10.5)
Chloride: 105 meq/L (ref 96–112)
Creatinine, Ser: 0.85 mg/dL (ref 0.40–1.20)
GFR: 78.67 mL/min (ref >60.00)
Glucose, Bld: 83 mg/dL (ref 70–99)
Potassium: 3.8 meq/L (ref 3.5–5.1)
Sodium: 141 meq/L (ref 135–145)
Total Bilirubin: 0.4 mg/dL (ref 0.2–1.2)
Total Protein: 7.3 g/dL (ref 6.0–8.3)

## 2023-09-07 LAB — HEMOGLOBIN A1C: Hgb A1c MFr Bld: 6 % (ref 4.6–6.5)

## 2023-09-07 LAB — LIPID PANEL
Cholesterol: 140 mg/dL (ref 0–200)
HDL: 37.9 mg/dL — ABNORMAL LOW (ref >39.00)
LDL Cholesterol: 83 mg/dL (ref 0–99)
NonHDL: 102.31
Total CHOL/HDL Ratio: 4
Triglycerides: 98 mg/dL (ref 0.0–149.0)
VLDL: 19.6 mg/dL (ref 0.0–40.0)

## 2023-09-07 MED ORDER — ATORVASTATIN CALCIUM 10 MG PO TABS
10.0000 mg | ORAL_TABLET | Freq: Every day | ORAL | 3 refills | Status: DC
Start: 2023-09-07 — End: 2023-09-22

## 2023-09-07 MED ORDER — TRAMADOL HCL 50 MG PO TABS: 50.0000 mg | ORAL_TABLET | Freq: Every day | ORAL | 0 refills | Status: DC | PRN

## 2023-09-07 NOTE — Assessment & Plan Note (Signed)
Stable.  Diet and nutrition discussed. Advised to decrease amount of daily carbohydrate intake and daily calories and increase amount of plant-based protein in her diet. Continue atorvastatin 10 mg daily. Lipid profile done today

## 2023-09-07 NOTE — Assessment & Plan Note (Signed)
Well-controlled hypertension Continue amlodipine 10 mg daily and hydrochlorothiazide 12.5 mg daily with losartan 50 mg daily

## 2023-09-07 NOTE — Assessment & Plan Note (Signed)
With history of GERD.  Smoker. Differential diagnosis discussed with patient. Concerning for malignancy. Scheduled for upper endoscopy February 6th

## 2023-09-07 NOTE — Assessment & Plan Note (Signed)
Still active and affecting quality of life despite PPI treatment Having dysphagia mostly to solid foods leading to decreased appetite and weight loss Scheduled for upper endoscopy February 6th

## 2023-09-07 NOTE — Assessment & Plan Note (Signed)
On and off pain to multiple joints. Contributing to chronic pain Recommend referral to pain management clinic Pain management discussed

## 2023-09-07 NOTE — Patient Instructions (Signed)

## 2023-09-07 NOTE — Assessment & Plan Note (Signed)
Has been to pain management clinics in the past. Patient still smokes marijuana and occasionally uses Flexeril Has been dismissed from clinics in the past due to failed toxicology screens. Chronic pain however affecting quality of life significantly. Will place referral to different pain management clinic.

## 2023-09-07 NOTE — Progress Notes (Signed)
Lennart Pall 52 y.o.   Chief Complaint  Patient presents with  . Annual Exam    Patient states having a itchy rash on her back for the past 6 months. Patient states Her o2, energy level, her eating is terrible, also having SOB for the past 5 months,    HISTORY OF PRESENT ILLNESS: This is a 53 y.o. female here for annual exam and follow-up of chronic medical conditions Patient has history of chronic pain.  Needs referral to pain management clinic Complaining of low energy level and appetite loss.  Complaining of weight loss. Complaining of dysphagia mostly to solid foods.  Scheduled for upper endoscopy February 6th Complaining of intermittent rash to her back Also has occasional dyspnea on exertion No other complaints or medical concerns today. Wt Readings from Last 3 Encounters:  09/07/23 203 lb (92.1 kg)  05/31/23 207 lb 8 oz (94.1 kg)  03/10/23 200 lb (90.7 kg)     HPI   Prior to Admission medications   Medication Sig Start Date End Date Taking? Authorizing Provider  amLODipine (NORVASC) 10 MG tablet Take 1 tablet (10 mg total) by mouth daily. 03/10/23  Yes SagardiaEilleen Kempf, MD  aspirin EC 81 MG tablet Take 81 mg by mouth daily. Swallow whole.   Yes [provider]  cyclobenzaprine (FLEXERIL) 5 MG tablet Take 1 tablet (5 mg total) by mouth 3 (three) times daily as needed for muscle spasms. 05/31/23  Yes Selma Mink, Eilleen Kempf, MD  hydrochlorothiazide (HYDRODIURIL) 12.5 MG tablet Take 1 tablet (12.5 mg total) by mouth daily. 08/26/22  Yes Martavious Hartel, Eilleen Kempf, MD  losartan (COZAAR) 50 MG tablet Take 1 tablet (50 mg total) by mouth daily. 03/10/23  Yes Luretha Eberly, Eilleen Kempf, MD  traMADol (ULTRAM) 50 MG tablet Take 1-2 tablets (50-100 mg total) by mouth daily as needed. 01/08/23  Yes Tarry Kos, MD  atorvastatin (LIPITOR) 10 MG tablet TAKE 1 TABLET(10 MG) BY MOUTH DAILY Patient not taking: Reported on 09/07/2023 05/05/23   Georgina Quint, MD  benzonatate  (TESSALON) 100 MG capsule Take 1 capsule (100 mg total) by mouth every 8 (eight) hours. Patient not taking: Reported on 09/07/2023 05/12/22   Carlisle Beers, FNP  Cholecalciferol (VITAMIN D3) 50 MCG (2000 UT) TABS Take 1 tablet (50 mcg total) by mouth daily. Patient not taking: Reported on 09/07/2023 05/31/23   Georgina Quint, MD  DULoxetine (CYMBALTA) 30 MG capsule Take 1 capsule (30 mg total) by mouth daily. X 1 week, then 60 mg daily- for nerve and back pain Patient not taking: Reported on 09/07/2023 10/13/21   Lovorn, Aundra Millet, MD  Galcanezumab-gnlm (EMGALITY) 120 MG/ML SOAJ Inject 120 mg into the skin every 30 (thirty) days. Patient not taking: Reported on 09/07/2023 03/10/22   Ocie Doyne, MD  HYDROcodone-acetaminophen (NORCO) 5-325 MG tablet Take 1 tablet by mouth 2 (two) times daily as needed. Patient not taking: Reported on 09/07/2023 12/25/22   Cristie Hem, PA-C  hydrocortisone (ANUSOL-HC) 2.5 % rectal cream Place 1 Application rectally 2 (two) times daily. Patient not taking: Reported on 09/07/2023 12/15/22   Georgina Quint, MD  hydrocortisone (ANUSOL-HC) 25 MG suppository Place 1 suppository (25 mg total) rectally 2 (two) times daily. Patient not taking: Reported on 09/07/2023 05/31/23   Georgina Quint, MD  lidocaine (XYLOCAINE) 5 % ointment Apply 1 Application topically 4 (four) times daily. Apply with voltaren gel 4x/day to L ankle. Dime sized amount- for burning/tingling. Patient not taking: Reported on 09/07/2023  08/18/22   Georgina Quint, MD  Multiple Vitamin (MULTIVITAMIN) tablet Take 1 tablet by mouth daily. Patient not taking: Reported on 09/07/2023    [provider]  ondansetron (ZOFRAN) 4 MG tablet Take 1 tablet (4 mg total) by mouth every 8 (eight) hours as needed for nausea or vomiting. Patient not taking: Reported on 09/07/2023 12/03/22   Cristie Hem, PA-C  pantoprazole (PROTONIX) 40 MG tablet Take 1 tablet (40 mg total) by mouth  daily. Patient not taking: Reported on 09/07/2023 03/10/23   Georgina Quint, MD  pregabalin (LYRICA) 100 MG capsule Take 1 capsule (100 mg total) by mouth 3 (three) times daily. Take 1 tab nightly x 1 week, then 100 mg in AM and 200 mg night- for nerve pain Patient not taking: Reported on 09/07/2023 05/01/22   Genice Rouge, MD  venlafaxine XR (EFFEXOR XR) 37.5 MG 24 hr capsule Take 1 capsule (37.5 mg total) by mouth daily with breakfast. Patient not taking: Reported on 09/07/2023 09/23/21   Ocie Doyne, MD  Vitamin D, Ergocalciferol, (DRISDOL) 1.25 MG (50000 UNIT) CAPS capsule Take 1 capsule (50,000 Units total) by mouth every 7 (seven) days. Patient not taking: Reported on 09/07/2023 05/22/21   Claiborne Rigg, NP  zolmitriptan (ZOMIG) 5 MG tablet Take 1 tablet (5 mg total) by mouth as needed for migraine. May repeat dose in 2 hours if headache persists. Max dose 2 pills in 24 hours Patient not taking: Reported on 09/07/2023 05/31/23   Georgina Quint, MD    Allergies  Allergen Reactions  . Ativan [Lorazepam] Anxiety    HALLUCINATIONS  . Anesthesia S-I-40 [Propofol]     hallucinations  . Ditropan [Oxybutynin]     Anxiety  . Tylenol With Codeine #3 [Acetaminophen-Codeine] Hives and Itching    Patient Active Problem List   Diagnosis Date Noted  . External hemorrhoids 05/31/2023  . Internal hemorrhoids 05/31/2023  . Gastroesophageal reflux disease with esophagitis without hemorrhage 03/10/2023  . Former smoker 03/10/2023  . Right carpal tunnel syndrome 08/21/2022  . Trigger middle finger of right hand 08/21/2022  . Mallet finger of right hand 08/18/2022  . Nonintractable chronic migraine 02/12/2022  . Dyslipidemia 07/02/2021  . Chronic pain syndrome 07/02/2021  . History of blood clots 07/02/2021  . Erosive osteoarthritis of multiple sites 07/02/2021  . Primary osteoarthritis of right hip 04/08/2017  . History of hip replacement 03/25/2017  . Primary osteoarthritis of  left hip 12/22/2016  . Major depressive disorder 10/24/2012  . Microalbuminuria 04/16/2009  . Bipolar 1 disorder, depressed (HCC) 05/17/2007  . Lumbosacral degenerative disk disease 03/23/2005  . Hypertension 08/18/2003    Past Medical History:  Diagnosis Date  . Anemia    low iron during pregnancy  . Anxiety   . Arthritis   . Asthma    as a child  . Bipolar disorder (HCC)   . Depression   . GERD (gastroesophageal reflux disease)   . Headache   . Hypertension   . Left trimalleolar fracture   . Pneumonia    as a child  . PONV (postoperative nausea and vomiting)   . Restless legs     Past Surgical History:  Procedure Laterality Date  . BILATERAL ANTERIOR TOTAL HIP ARTHROPLASTY Bilateral 03/25/2017   Procedure: BILATERAL ANTERIOR TOTAL HIP ARTHROPLASTY;  Surgeon: Tarry Kos, MD;  Location: [facility] OR;  Service: Orthopedics;  Laterality: Bilateral;  . CORONARY ULTRASOUND/IVUS N/A 06/09/2019   Procedure: INTRAVASCULAR ULTRASOUND/IVUS;  Surgeon: Sherren Kerns, MD;  Location: [facility] INVASIVE CV LAB;  Service: Cardiovascular;  Laterality: N/A;  . DENTAL SURGERY     all teeth extracted  . LAMINECTOMY AND MICRODISCECTOMY LUMBAR SPINE   06/27/2010  . ORIF ANKLE FRACTURE Left 03/17/2019   Procedure: OPEN REDUCTION INTERNAL FIXATION (ORIF) LEFT ANKLE FRACTURE;  Surgeon: Tarry Kos, MD;  Location: [facility];  Service: Orthopedics;  Laterality: Left;  . PERIPHERAL VASCULAR INTERVENTION Left 06/09/2019   Procedure: PERIPHERAL VASCULAR INTERVENTION;  Surgeon: Sherren Kerns, MD;  Location: [facility] INVASIVE CV LAB;  Service: Cardiovascular;  Laterality: Left;  Common Iliac vein  . PERIPHERAL VASCULAR THROMBECTOMY Left 06/09/2019   Procedure: PERIPHERAL VASCULAR THROMBECTOMY;  Surgeon: Sherren Kerns, MD;  Location: [facility] INVASIVE CV LAB;  Service: Cardiovascular;  Laterality: Left;  . TUBAL LIGATION Bilateral     Social History   Socioeconomic History  . Marital status:  Married    Spouse name: Not on file  . Number of children: Not on file  . Years of education: Not on file  . Highest education level: 10th grade  Occupational History  . Not on file  Tobacco Use  . Smoking status: Former    Current packs/day: 0.25    Types: Cigarettes    Passive exposure: Current  . Smokeless tobacco: Never  Vaping Use  . Vaping status: Never Used  Substance and Sexual Activity  . Alcohol use: No  . Drug use: No  . Sexual activity: Yes    Birth control/protection: Surgical    Comment: BTL  Other Topics Concern  . Not on file  Social History Narrative  . Not on file   Social Drivers of Health   Financial Resource Strain: Low Risk  (05/31/2023)   Overall Financial Resource Strain (CARDIA)   . Difficulty of Paying Living Expenses: Not hard at all  Food Insecurity: No Food Insecurity (05/31/2023)   Hunger Vital Sign   . Worried About Programme researcher, broadcasting/film/video in the Last Year: Never true   . Ran Out of Food in the Last Year: Never true  Transportation Needs: No Transportation Needs (05/31/2023)   PRAPARE - Transportation   . Lack of Transportation (Medical): No   . Lack of Transportation (Non-Medical): No  Physical Activity: Sufficiently Active (05/31/2023)   Exercise Vital Sign   . Days of Exercise per Week: 7 days   . Minutes of Exercise per Session: 60 min  Stress: No Stress Concern Present (05/31/2023)   [employer] of Occupational Health - Occupational Stress Questionnaire   . Feeling of Stress : Not at all  Social Connections: Socially Integrated (05/31/2023)   Social Connection and Isolation Panel [NHANES]   . Frequency of Communication with Friends and Family: More than three times a week   . Frequency of Social Gatherings with Friends and Family: Twice a week   . Attends Religious Services: More than 4 times per year   . Active Member of Clubs or Organizations: Yes   . Attends Banker Meetings: More than 4 times per year   .  Marital Status: Married  Catering manager Violence: Not At Risk (10/01/2022)   Humiliation, Afraid, Rape, and Kick questionnaire   . Fear of Current or Ex-Partner: No   . Emotionally Abused: No   . Physically Abused: No   . Sexually Abused: No    Family History  Problem Relation Age of Onset  . Stomach cancer Mother   . HIV Mother   . Cancer Mother   .  Colon cancer Sister 68  . Breast cancer Neg Hx   . Rectal cancer Neg Hx      Review of Systems  Constitutional:  Positive for malaise/fatigue and weight loss.  HENT:  Negative for congestion and sore throat.   Respiratory:  Positive for shortness of breath (Dyspnea on exertion). Negative for cough.   Cardiovascular: Negative.  Negative for chest pain and palpitations.  Gastrointestinal:  Negative for abdominal pain, blood in stool, constipation, diarrhea, nausea and vomiting.       Dysphagia to solids  Genitourinary: Negative.  Negative for dysuria and hematuria.  Skin: Negative.  Negative for rash.  Neurological: Negative.  Negative for dizziness and headaches.  All other systems reviewed and are negative.   Vitals:   09/07/23 1037  BP: 126/88  Pulse: 66  Temp: 98.4 F (36.9 C)  SpO2: 97%    Physical Exam Vitals reviewed.  Constitutional:      Appearance: Normal appearance.  HENT:     Head: Normocephalic.     Right Ear: Tympanic membrane, ear canal and external ear normal.     Left Ear: Tympanic membrane, ear canal and external ear normal.     Mouth/Throat:     Mouth: Mucous membranes are moist.     Pharynx: Oropharynx is clear.  Eyes:     Extraocular Movements: Extraocular movements intact.     Conjunctiva/sclera: Conjunctivae normal.     Pupils: Pupils are equal, round, and reactive to light.  Cardiovascular:     Rate and Rhythm: Normal rate and regular rhythm.     Pulses: Normal pulses.     Heart sounds: Normal heart sounds.  Pulmonary:     Effort: Pulmonary effort is normal.     Breath sounds: Normal  breath sounds.  Abdominal:     Palpations: Abdomen is soft.     Tenderness: There is no abdominal tenderness.  Musculoskeletal:     Cervical back: No tenderness.  Lymphadenopathy:     Cervical: No cervical adenopathy.  Skin:    General: Skin is warm and dry.     Capillary Refill: Capillary refill takes less than 2 seconds.  Neurological:     General: No focal deficit present.     Mental Status: She is alert and oriented to person, place, and time.  Psychiatric:        Mood and Affect: Mood normal.        Behavior: Behavior normal.     ASSESSMENT & PLAN: Problem List Items Addressed This Visit       Cardiovascular and Mediastinum   Hypertension   Well-controlled hypertension Continue amlodipine 10 mg daily and hydrochlorothiazide 12.5 mg daily with losartan 50 mg daily      Relevant Medications   atorvastatin (LIPITOR) 10 MG tablet   Other Relevant Orders   CBC with Differential/Platelet   Comprehensive metabolic panel   Hemoglobin A1c   Lipid panel     Digestive   Gastroesophageal reflux disease with esophagitis without hemorrhage   Still active and affecting quality of life despite PPI treatment Having dysphagia mostly to solid foods leading to decreased appetite and weight loss Scheduled for upper endoscopy February 6th      Relevant Orders   CBC with Differential/Platelet   Esophageal dysphagia   With history of GERD.  Smoker. Differential diagnosis discussed with patient. Concerning for malignancy. Scheduled for upper endoscopy February 6th        Musculoskeletal and Integument   Erosive osteoarthritis of multiple  sites   On and off pain to multiple joints. Contributing to chronic pain Recommend referral to pain management clinic Pain management discussed      Relevant Medications   traMADol (ULTRAM) 50 MG tablet   Other Relevant Orders   CBC with Differential/Platelet     Other   Dyslipidemia   Stable.  Diet and nutrition discussed. Advised  to decrease amount of daily carbohydrate intake and daily calories and increase amount of plant-based protein in her diet. Continue atorvastatin 10 mg daily. Lipid profile done today      Relevant Medications   atorvastatin (LIPITOR) 10 MG tablet   Other Relevant Orders   Comprehensive metabolic panel   Hemoglobin A1c   Lipid panel   Chronic pain syndrome   Has been to pain management clinics in the past. Patient still smokes marijuana and occasionally uses Flexeril Has been dismissed from clinics in the past due to failed toxicology screens. Chronic pain however affecting quality of life significantly. Will place referral to different pain management clinic.      Relevant Medications   traMADol (ULTRAM) 50 MG tablet   Other Relevant Orders   Ambulatory referral to Pain Clinic   Other Visit Diagnoses       Encounter for general adult medical examination with abnormal findings    -  Primary   Relevant Medications   atorvastatin (LIPITOR) 10 MG tablet   traMADol (ULTRAM) 50 MG tablet   Other Relevant Orders   Ambulatory referral to Pain Clinic   CBC with Differential/Platelet   Comprehensive metabolic panel   Hemoglobin A1c   Lipid panel     Screening for deficiency anemia       Relevant Orders   CBC with Differential/Platelet     Screening for endocrine, metabolic and immunity disorder       Relevant Orders   Comprehensive metabolic panel   Hemoglobin A1c      Modifiable risk factors discussed with patient. Anticipatory guidance according to age provided. The following topics were also discussed: Social Determinants of Health Smoking and cardiovascular/cancer risks associated with this. Diet and nutrition Benefits of exercise Cancer screening and review of most recent mammogram and colonoscopy reports Vaccinations review and recommendations Cardiovascular risk assessment Review of multiple chronic medical conditions under management Review of all  medications Pain management Mental health including depression and anxiety Fall and accident prevention  Patient Instructions  Health Maintenance, Female Adopting a healthy lifestyle and getting preventive care are important in promoting health and wellness. Ask your health care provider about: The right schedule for you to have regular tests and exams. Things you can do on your own to prevent diseases and keep yourself healthy. What should I know about diet, weight, and exercise? Eat a healthy diet  Eat a diet that includes plenty of vegetables, fruits, low-fat dairy products, and lean protein. Do not eat a lot of foods that are high in solid fats, added sugars, or sodium. Maintain a healthy weight Body mass index (BMI) is used to identify weight problems. It estimates body fat based on height and weight. Your health care provider can help determine your BMI and help you achieve or maintain a healthy weight. Get regular exercise Get regular exercise. This is one of the most important things you can do for your health. Most adults should: Exercise for at least 150 minutes each week. The exercise should increase your heart rate and make you sweat (moderate-intensity exercise). Do strengthening exercises at least twice  a week. This is in addition to the moderate-intensity exercise. Spend less time sitting. Even light physical activity can be beneficial. Watch cholesterol and blood lipids Have your blood tested for lipids and cholesterol at 53 years of age, then have this test every 5 years. Have your cholesterol levels checked more often if: Your lipid or cholesterol levels are high. You are older than 53 years of age. You are at high risk for heart disease. What should I know about cancer screening? Depending on your health history and family history, you may need to have cancer screening at various ages. This may include screening for: Breast cancer. Cervical cancer. Colorectal  cancer. Skin cancer. Lung cancer. What should I know about heart disease, diabetes, and high blood pressure? Blood pressure and heart disease High blood pressure causes heart disease and increases the risk of stroke. This is more likely to develop in people who have high blood pressure readings or are overweight. Have your blood pressure checked: Every 3-5 years if you are 71-61 years of age. Every year if you are 13 years old or older. Diabetes Have regular diabetes screenings. This checks your fasting blood sugar level. Have the screening done: Once every three years after age 59 if you are at a normal weight and have a low risk for diabetes. More often and at a younger age if you are overweight or have a high risk for diabetes. What should I know about preventing infection? Hepatitis B If you have a higher risk for hepatitis B, you should be screened for this virus. Talk with your health care provider to find out if you are at risk for hepatitis B infection. Hepatitis C Testing is recommended for: Everyone born from [birth year] through 1965. Anyone with known risk factors for hepatitis C. Sexually transmitted infections (STIs) Get screened for STIs, including gonorrhea and chlamydia, if: You are sexually active and are younger than 53 years of age. You are older than 53 years of age and your health care provider tells you that you are at risk for this type of infection. Your sexual activity has changed since you were last screened, and you are at increased risk for chlamydia or gonorrhea. Ask your health care provider if you are at risk. Ask your health care provider about whether you are at high risk for HIV. Your health care provider may recommend a prescription medicine to help prevent HIV infection. If you choose to take medicine to prevent HIV, you should first get tested for HIV. You should then be tested every 3 months for as long as you are taking the medicine. Pregnancy If you are  about to stop having your period (premenopausal) and you may become pregnant, seek counseling before you get pregnant. Take 400 to 800 micrograms (mcg) of folic acid every day if you become pregnant. Ask for birth control (contraception) if you want to prevent pregnancy. Osteoporosis and menopause Osteoporosis is a disease in which the bones lose minerals and strength with aging. This can result in bone fractures. If you are 65 years old or older, or if you are at risk for osteoporosis and fractures, ask your health care provider if you should: Be screened for bone loss. Take a calcium or vitamin D supplement to lower your risk of fractures. Be given hormone replacement therapy (HRT) to treat symptoms of menopause. Follow these instructions at home: Alcohol use Do not drink alcohol if: Your health care provider tells you not to drink. You are pregnant, may  be pregnant, or are planning to become pregnant. If you drink alcohol: Limit how much you have to: 0-1 drink a day. Know how much alcohol is in your drink. In the [place] one drink equals one 12 oz bottle of beer (355 mL), one 5 oz glass of wine (148 mL), or one 1 oz glass of hard liquor (44 mL). Lifestyle Do not use any products that contain nicotine or tobacco. These products include cigarettes, chewing tobacco, and vaping devices, such as e-cigarettes. If you need help quitting, ask your health care provider. Do not use street drugs. Do not share needles. Ask your health care provider for help if you need support or information about quitting drugs. General instructions Schedule regular health, dental, and eye exams. Stay current with your vaccines. Tell your health care provider if: You often feel depressed. You have ever been abused or do not feel safe at home. Summary Adopting a healthy lifestyle and getting preventive care are important in promoting health and wellness. Follow your health care provider's instructions about  healthy diet, exercising, and getting tested or screened for diseases. Follow your health care provider's instructions on monitoring your cholesterol and blood pressure. This information is not intended to replace advice given to you by your health care provider. Make sure you discuss any questions you have with your health care provider. Document Revised: 12/23/2020 Document Reviewed: 12/23/2020 Elsevier Patient Education  2024 Elsevier Inc.     Edwina Barth, MD [facility]

## 2023-09-09 ENCOUNTER — Other Ambulatory Visit: Payer: Self-pay | Admitting: Radiology

## 2023-09-09 DIAGNOSIS — Z0001 Encounter for general adult medical examination with abnormal findings: Secondary | ICD-10-CM

## 2023-09-09 DIAGNOSIS — G894 Chronic pain syndrome: Secondary | ICD-10-CM

## 2023-09-09 NOTE — Telephone Encounter (Signed)
Please refer to different pain management clinic.  Thanks.

## 2023-09-22 ENCOUNTER — Ambulatory Visit (INDEPENDENT_AMBULATORY_CARE_PROVIDER_SITE_OTHER): Payer: 59 | Admitting: Physician Assistant

## 2023-09-22 ENCOUNTER — Encounter: Payer: Self-pay | Admitting: Physician Assistant

## 2023-09-22 VITALS — BP 134/80 | HR 68 | Ht 60.0 in | Wt 200.5 lb

## 2023-09-22 DIAGNOSIS — Z860101 Personal history of adenomatous and serrated colon polyps: Secondary | ICD-10-CM

## 2023-09-22 DIAGNOSIS — R1310 Dysphagia, unspecified: Secondary | ICD-10-CM | POA: Diagnosis not present

## 2023-09-22 DIAGNOSIS — R630 Anorexia: Secondary | ICD-10-CM

## 2023-09-22 DIAGNOSIS — F1290 Cannabis use, unspecified, uncomplicated: Secondary | ICD-10-CM | POA: Diagnosis not present

## 2023-09-22 DIAGNOSIS — R634 Abnormal weight loss: Secondary | ICD-10-CM

## 2023-09-22 DIAGNOSIS — Z800 Family history of malignant neoplasm of digestive organs: Secondary | ICD-10-CM

## 2023-09-22 DIAGNOSIS — R1013 Epigastric pain: Secondary | ICD-10-CM

## 2023-09-22 DIAGNOSIS — R1319 Other dysphagia: Secondary | ICD-10-CM

## 2023-09-22 DIAGNOSIS — R110 Nausea: Secondary | ICD-10-CM

## 2023-09-22 MED ORDER — PANTOPRAZOLE SODIUM 40 MG PO TBEC: 40.0000 mg | DELAYED_RELEASE_TABLET | Freq: Every morning | ORAL | 11 refills | Status: AC

## 2023-09-22 NOTE — Progress Notes (Signed)
 Subjective:    Patient ID: Patricia Davila, female    DOB: 12-Jun-1971, 53 y.o.   MRN: [id number]  HPI Graciella is a pleasant 53 year old African-American female established with Dr. Shila, who was last seen in 2023 when she underwent colonoscopy.  She comes in today with complaints about 53 longstanding GERD symptoms, which have been worse over the past year as well as solid food dysphagia, frequent nausea and weight loss. She has not had prior EGD here but relates that she did have a very remote EGD greater than 20 years ago. She says she has gradually lost about 22 pounds in a little over a year.  She says that her stomach problems have been present for years, and that she had taken Prilosec remotely but it has not had a prescription for any medication for many years.  She is complaining of epigastric pain which she describes as burning in nature, sometimes relieved by drinking something cold.  She has frequent nausea usually does not vomit.  She does not have true dysphagia to liquids but says if she is drinking sodas sometimes they feel like they do not go down and will regurgitate back up.  Frequently with solid food she feels as if the food is sitting in her esophagus prolonged period of time, gradually progressing to her stomach.  Her appetite overall is decreased. She does have some mild constipation problems as well.  She has not noted any recent melena or hematochezia. No EtOH use, she does use BC powders but denies using them on a daily basis as she usually takes 1 once or twice per week. She does consume cannabis on a daily basis and says she has for a long time which she uses to help control pain from multiple previous orthopedic surgeries etc.  She says she stopped marijuana for a couple of months to see if it would help improve her appetite, and did not seem to change any of her symptoms so she resumed.  At colonoscopy February 2023 2 polyps were removed the largest was 5 mm, also noted to  have nonbleeding internal hemorrhoids.  Path on the polyps 1 tubular adenoma and 1 hyperplastic and indicated for 5-year interval follow-up.  She does have family history of colon cancer in her mother..  Other medical problems include hypertension, chronic back and hip pain.  Recent labs from 09/07/2023 reviewed with normal CBC and c-Met, HGBA1c of 6.0.. TSH 0.5 15 February 2023  Review of Systems Pertinent positive and negative review of systems were noted in the above HPI section.  All other review of systems was otherwise negative.   Outpatient Encounter Medications as of 09/22/2023  Medication Sig   amLODipine  (NORVASC ) 10 MG tablet Take 1 tablet (10 mg total) by mouth daily.   aspirin  EC 81 MG tablet Take 81 mg by mouth daily. Swallow whole.   cyclobenzaprine  (FLEXERIL ) 5 MG tablet Take 1 tablet (5 mg total) by mouth 3 (three) times daily as needed for muscle spasms.   hydrochlorothiazide  (HYDRODIURIL ) 12.5 MG tablet Take 1 tablet (12.5 mg total) by mouth daily.   losartan  (COZAAR ) 50 MG tablet Take 1 tablet (50 mg total) by mouth daily.   pantoprazole  (PROTONIX ) 40 MG tablet Take 1 tablet (40 mg total) by mouth in the morning. Before breakfast   traMADol  (ULTRAM ) 50 MG tablet Take 1-2 tablets (50-100 mg total) by mouth daily as needed.   [DISCONTINUED] atorvastatin  (LIPITOR) 10 MG tablet Take 1 tablet (10 mg total) by  mouth daily.   No facility-administered encounter medications on file as of 09/22/2023.   Allergies  Allergen Reactions   Ativan [Lorazepam] Anxiety    HALLUCINATIONS   Anesthesia S-I-40 [Propofol ]     hallucinations   Ditropan  [Oxybutynin ]     Anxiety   Lipitor [Atorvastatin ] Hives   Tylenol  With Codeine #3 [Acetaminophen -Codeine] Hives and Itching   Patient Active Problem List   Diagnosis Date Noted   Esophageal dysphagia 09/07/2023   External hemorrhoids 05/31/2023   Internal hemorrhoids 05/31/2023   Gastroesophageal reflux disease with esophagitis without  hemorrhage 03/10/2023   Former smoker 03/10/2023   Right carpal tunnel syndrome 08/21/2022   Trigger middle finger of right hand 08/21/2022   Mallet finger of right hand 08/18/2022   Nonintractable chronic migraine 02/12/2022   Dyslipidemia 07/02/2021   Chronic pain syndrome 07/02/2021   History of blood clots 07/02/2021   Erosive osteoarthritis of multiple sites 07/02/2021   Primary osteoarthritis of right hip 04/08/2017   History of hip replacement 03/25/2017   Primary osteoarthritis of left hip 12/22/2016   Major depressive disorder 10/24/2012   Microalbuminuria 04/16/2009   Bipolar 1 disorder, depressed (HCC) 05/17/2007   Lumbosacral degenerative disk disease 03/23/2005   Hypertension 08/18/2003   Social History   Socioeconomic History   Marital status: Married    Spouse name: Not on file   Number of children: Not on file   Years of education: Not on file   Highest education level: 10th grade  Occupational History   Not on file  Tobacco Use   Smoking status: Former    Current packs/day: 0.25    Types: Cigarettes    Passive exposure: Current   Smokeless tobacco: Never  Vaping Use   Vaping status: Never Used  Substance and Sexual Activity   Alcohol use: No   Drug use: Yes    Types: Marijuana   Sexual activity: Yes    Birth control/protection: Surgical    Comment: BTL  Other Topics Concern   Not on file  Social History Narrative   Not on file   Social Drivers of Health   Financial Resource Strain: Low Risk  (05/31/2023)   Overall Financial Resource Strain (CARDIA)    Difficulty of Paying Living Expenses: Not hard at all  Food Insecurity: No Food Insecurity (05/31/2023)   Hunger Vital Sign    Worried About Radiation Protection Practitioner of Food in the Last Year: Never true    Ran Out of Food in the Last Year: Never true  Transportation Needs: No Transportation Needs (05/31/2023)   PRAPARE - Administrator, Civil Service (Medical): No    Lack of Transportation  (Non-Medical): No  Physical Activity: Sufficiently Active (05/31/2023)   Exercise Vital Sign    Days of Exercise per Week: 7 days    Minutes of Exercise per Session: 60 min  Stress: No Stress Concern Present (05/31/2023)   [employer] of Occupational Health - Occupational Stress Questionnaire    Feeling of Stress : Not at all  Social Connections: Socially Integrated (05/31/2023)   Social Connection and Isolation Panel [NHANES]    Frequency of Communication with Friends and Family: More than three times a week    Frequency of Social Gatherings with Friends and Family: Twice a week    Attends Religious Services: More than 4 times per year    Active Member of [employer] or Organizations: Yes    Attends Banker Meetings: More than 4 times per year  Marital Status: Married  Catering Manager Violence: Not At Risk (10/01/2022)   Humiliation, Afraid, Rape, and Kick questionnaire    Fear of Current or Ex-Partner: No    Emotionally Abused: No    Physically Abused: No    Sexually Abused: No    Ms. Weld's family history includes Cancer in her mother; Colon cancer (age of onset: 44) in her sister; HIV in her mother; Stomach cancer in her mother.      Objective:    Vitals:   09/22/23 0833  BP: 134/80  Pulse: 68    Physical Exam Well-developed well-nourished  AA female  in no acute distress.  Height, Weight,200  BMI39.1  HEENT; nontraumatic normocephalic, EOMI, PE R LA, sclera anicteric. Oropharynx;not done Neck; supple, no JVD Cardiovascular; regular rate and rhythm with S1-S2, no murmur rub or gallop Pulmonary; Clear bilaterally Abdomen; soft, obese, there is mild tenderness in the epigastrium, no guarding nondistended, no palpable mass or hepatosplenomegaly, bowel sounds are active Rectal; not done today Skin; benign exam, no jaundice rash or appreciable lesions Extremities; no clubbing cyanosis or edema skin warm and dry Neuro/Psych; alert and oriented x4, grossly  nonfocal mood and affect appropriate        Assessment & Plan:   #46 53 year old African-American female with what she describes as chronic GERD symptoms, and associated intermittent solid food dysphagia over the past year. Rule out peptic stricture, Schatzki's ring versus motility issues.  #2 epigastric pain, burning in nature, decreased appetite and weight loss  Rule out chronic gastropathy, peptic ulcer disease, H. pylori induced gastropathy, occult malignancy, rule out possible cannabis hyperemesis type syndrome with chronic abdominal pain and weight loss secondary  #3 family history of colon cancer in patient's mother #4 personal history of adenomatous colon polyps-up-to-date with colonoscopy last done February 2023 and indicated for 5-year interval follow-up #5 hypertension #6.  Obesity #7.  Chronic pain syndrome #8  Daily cannabis use  Plan; Start Protonix  40 mg p.o. every morning AC breakfast Advised patient to avoid BC powders , Goody's and NSAIDs. Patient will be scheduled for EGD with Dr. Shila  with possible esophageal dilation.  If EGD is unremarkable and patient's symptoms are persisting, she will need labs, and cross-sectional imaging with CT abdomen and pelvis We also discussed that if EGD is negative, then she should give herself a longer trial of complete cannabis abstinence.     Kamla Skilton S Mako Pelfrey PA-C 09/22/2023   Cc: Purcell Emil Schanz, *

## 2023-09-22 NOTE — Patient Instructions (Signed)
 You have been scheduled for an endoscopy. Please follow written instructions given to you at your visit today.  If you use inhalers (even only as needed), please bring them with you on the day of your procedure.  If you take any of the following medications, they will need to be adjusted prior to your procedure:   DO NOT TAKE 7 DAYS PRIOR TO TEST- Trulicity (dulaglutide) Ozempic, Wegovy (semaglutide) Mounjaro (tirzepatide) Bydureon Bcise (exanatide extended release)  DO NOT TAKE 1 DAY PRIOR TO YOUR TEST Rybelsus (semaglutide) Adlyxin (lixisenatide) Victoza (liraglutide) Byetta (exanatide) ___________________________________________________________________________   We have sent the following medications to your pharmacy for you to pick up at your convenience: Protonix   AVOID  Asprin BC Powders,Advil   Due to recent changes in healthcare laws, you may see the results of your imaging and laboratory studies on MyChart before your provider has had a chance to review them.  We understand that in some cases there may be results that are confusing or concerning to you. Not all laboratory results come back in the same time frame and the provider may be waiting for multiple results in order to interpret others.  Please give us  48 hours in order for your provider to thoroughly review all the results before contacting the office for clarification of your results.    I appreciate the  opportunity to care for you  Thank You   Amy Ever RIGGERS

## 2023-10-02 ENCOUNTER — Encounter: Payer: Self-pay | Admitting: Emergency Medicine

## 2023-10-04 ENCOUNTER — Ambulatory Visit (INDEPENDENT_AMBULATORY_CARE_PROVIDER_SITE_OTHER): Payer: 59

## 2023-10-04 ENCOUNTER — Other Ambulatory Visit: Payer: Self-pay | Admitting: Emergency Medicine

## 2023-10-04 ENCOUNTER — Encounter: Payer: Self-pay | Admitting: Gastroenterology

## 2023-10-04 VITALS — Ht 60.0 in | Wt 200.0 lb

## 2023-10-04 DIAGNOSIS — Z0000 Encounter for general adult medical examination without abnormal findings: Secondary | ICD-10-CM

## 2023-10-04 MED ORDER — CLOBETASOL PROPIONATE 0.05 % EX CREA: 1.0000 | TOPICAL_CREAM | Freq: Two times a day (BID) | CUTANEOUS | 0 refills | Status: DC

## 2023-10-04 NOTE — Patient Instructions (Addendum)
Ms. Camarena , Thank you for taking time to come for your Medicare Wellness Visit. I appreciate your ongoing commitment to your health goals. Please review the following plan we discussed and let me know if I can assist you in the future.   Referrals/Orders/Follow-Ups/Clinician Recommendations: It was nice talking to you today.  Keep up the good work.  This is a list of the screening recommended for you and due dates:  Health Maintenance  Topic Date Due   COVID-19 Vaccine (1) Never done   Pap with HPV screening  11/09/2022   Medicare Annual Wellness Visit  10/02/2023   Mammogram  11/08/2024   Colon Cancer Screening  10/08/2026   Flu Shot  Completed   Hepatitis C Screening  Completed   HIV Screening  Completed   Zoster (Shingles) Vaccine  Completed   HPV Vaccine  Aged Out   DTaP/Tdap/Td vaccine  Discontinued    Advanced directives: (Declined) Advance directive discussed with you today. Even though you declined this today, please call our office should you change your mind, and we can give you the proper paperwork for you to fill out.  Next Medicare Annual Wellness Visit scheduled for next year: Yes

## 2023-10-04 NOTE — Telephone Encounter (Signed)
New prescription for clobetasol cream sent to pharmacy of record today.  Thanks.

## 2023-10-04 NOTE — Progress Notes (Signed)
Subjective:   Patricia Davila is a 53 y.o. female who presents for Medicare Annual (Subsequent) preventive examination.  Visit Complete: Virtual I connected with  Lennart Pall on 10/04/23 by a audio enabled telemedicine application and verified that I am speaking with the correct person using two identifiers.  Patient Location: Home  Provider Location: Home Office  I discussed the limitations of evaluation and management by telemedicine. The patient expressed understanding and agreed to proceed.  Vital Signs: Because this visit was a virtual/telehealth visit, some criteria may be missing or patient reported. Any vitals not documented were not able to be obtained and vitals that have been documented are patient reported.  Patient Medicare AWV questionnaire was completed by the patient on 09/28/2023; I have confirmed that all information answered by patient is correct and no changes since this date.  Cardiac Risk Factors include: advanced age (>65men, >70 women);dyslipidemia;hypertension     Objective:    Today's Vitals   10/04/23 1046  Weight: 200 lb (90.7 kg)  Height: 5' (1.524 m)   Body mass index is 39.06 kg/m.     10/04/2023   11:01 AM 10/01/2022    9:52 AM 10/13/2021   11:31 AM 12/27/2020   11:54 AM 12/13/2019   12:04 PM 07/06/2019    9:14 AM 06/07/2019   11:00 PM  Advanced Directives  Does Patient Have a Medical Advance Directive? No No No No No No No  Would patient like information on creating a medical advance directive? No - Patient declined No - Patient declined  No - Patient declined No - Patient declined No - Patient declined No - Patient declined    Current Medications (verified) Outpatient Encounter Medications as of 10/04/2023  Medication Sig   amLODipine (NORVASC) 10 MG tablet Take 1 tablet (10 mg total) by mouth daily.   aspirin EC 81 MG tablet Take 81 mg by mouth daily. Swallow whole.   clobetasol cream (TEMOVATE) 0.05 % Apply 1 Application topically 2 (two)  times daily.   cyclobenzaprine (FLEXERIL) 5 MG tablet Take 1 tablet (5 mg total) by mouth 3 (three) times daily as needed for muscle spasms.   hydrochlorothiazide (HYDRODIURIL) 12.5 MG tablet Take 1 tablet (12.5 mg total) by mouth daily.   losartan (COZAAR) 50 MG tablet Take 1 tablet (50 mg total) by mouth daily.   pantoprazole (PROTONIX) 40 MG tablet Take 1 tablet (40 mg total) by mouth in the morning. Before breakfast   traMADol (ULTRAM) 50 MG tablet Take 1-2 tablets (50-100 mg total) by mouth daily as needed.   No facility-administered encounter medications on file as of 10/04/2023.    Allergies (verified) Ativan [lorazepam], Anesthesia s-i-40 [propofol], Ditropan [oxybutynin], Lipitor [atorvastatin], and Tylenol with codeine #3 [acetaminophen-codeine]   History: Past Medical History:  Diagnosis Date   Anemia    low iron during pregnancy   Anxiety    Arthritis    Asthma    as a child   Bipolar disorder (HCC)    Depression    GERD (gastroesophageal reflux disease)    Headache    Hypertension    Left trimalleolar fracture    Pneumonia    as a child   PONV (postoperative nausea and vomiting)    Restless legs    Past Surgical History:  Procedure Laterality Date   BILATERAL ANTERIOR TOTAL HIP ARTHROPLASTY Bilateral 03/25/2017   Procedure: BILATERAL ANTERIOR TOTAL HIP ARTHROPLASTY;  Surgeon: Tarry Kos, MD;  Location: [facility] OR;  Service: Orthopedics;  Laterality: Bilateral;   CORONARY ULTRASOUND/IVUS N/A 06/09/2019   Procedure: INTRAVASCULAR ULTRASOUND/IVUS;  Surgeon: Sherren Kerns, MD;  Location: [facility] INVASIVE CV LAB;  Service: Cardiovascular;  Laterality: N/A;   DENTAL SURGERY     all teeth extracted   LAMINECTOMY AND MICRODISCECTOMY LUMBAR SPINE   06/27/2010   ORIF ANKLE FRACTURE Left 03/17/2019   Procedure: OPEN REDUCTION INTERNAL FIXATION (ORIF) LEFT ANKLE FRACTURE;  Surgeon: Tarry Kos, MD;  Location: [facility];  Service: Orthopedics;  Laterality: Left;    PERIPHERAL VASCULAR INTERVENTION Left 06/09/2019   Procedure: PERIPHERAL VASCULAR INTERVENTION;  Surgeon: Sherren Kerns, MD;  Location: [facility] INVASIVE CV LAB;  Service: Cardiovascular;  Laterality: Left;  Common Iliac vein   PERIPHERAL VASCULAR THROMBECTOMY Left 06/09/2019   Procedure: PERIPHERAL VASCULAR THROMBECTOMY;  Surgeon: Sherren Kerns, MD;  Location: [facility];  Service: Cardiovascular;  Laterality: Left;   TUBAL LIGATION Bilateral    Family History  Problem Relation Age of Onset   Stomach cancer Mother    HIV Mother    Cancer Mother    Colon cancer Sister 29   Breast cancer Neg Hx    Rectal cancer Neg Hx    Social History   Socioeconomic History   Marital status: Married    Spouse name: Earl Lites   Number of children: 6   Years of education: Not on file   Highest education level: 10th grade  Occupational History   Occupation: Disabled  Tobacco Use   Smoking status: Former    Current packs/day: 0.25    Types: Cigarettes    Passive exposure: Current   Smokeless tobacco: Never  Vaping Use   Vaping status: Never Used  Substance and Sexual Activity   Alcohol use: No   Drug use: Yes    Types: Marijuana   Sexual activity: Yes    Birth control/protection: Surgical    Comment: BTL  Other Topics Concern   Not on file  Social History Narrative   Lives with husband and 2 young adults   Social Drivers of Health   Financial Resource Strain: Low Risk  (10/04/2023)   Overall Financial Resource Strain (CARDIA)    Difficulty of Paying Living Expenses: Not hard at all  Food Insecurity: No Food Insecurity (10/04/2023)   Hunger Vital Sign    Worried About Running Out of Food in the Last Year: Never true    Ran Out of Food in the Last Year: Never true  Transportation Needs: No Transportation Needs (10/04/2023)   PRAPARE - Administrator, Civil Service (Medical): No    Lack of Transportation (Non-Medical): No  Physical Activity: Inactive (10/04/2023)    Exercise Vital Sign    Days of Exercise per Week: 0 days    Minutes of Exercise per Session: 0 min  Stress: No Stress Concern Present (10/04/2023)   [employer] of Occupational Health - Occupational Stress Questionnaire    Feeling of Stress : Not at all  Social Connections: Socially Integrated (10/04/2023)   Social Connection and Isolation Panel [NHANES]    Frequency of Communication with Friends and Family: Once a week    Frequency of Social Gatherings with Friends and Family: More than three times a week    Attends Religious Services: More than 4 times per year    Active Member of [employer] or Organizations: Yes    Attends Banker Meetings: Never    Marital Status: Married    Tobacco Counseling Counseling given:  Not Answered   Clinical Intake:  Pre-visit preparation completed: Yes  Pain : No/denies pain     BMI - recorded: 39.06 Nutritional Status: BMI > 30  Obese Nutritional Risks: None Diabetes: No  How often do you need to have someone help you when you read instructions, pamphlets, or other written materials from your doctor or pharmacy?: 1 - Never  Interpreter Needed?: No  Information entered by :: Gerell Fortson, RMA   Activities of Daily Living    09/28/2023    2:46 PM  In your present state of health, do you have any difficulty performing the following activities:  Hearing? 0  Vision? 0  Difficulty concentrating or making decisions? 0  Walking or climbing stairs? 1  Dressing or bathing? 1  Doing errands, shopping? 0  Preparing Food and eating ? N  Using the Toilet? N  In the past six months, have you accidently leaked urine? N  Do you have problems with loss of bowel control? N  Managing your Medications? N  Managing your Finances? N  Housekeeping or managing your Housekeeping? N    Patient Care Team: Georgina Quint, MD as PCP - General (Internal Medicine)  Indicate any recent Medical Services you may have received from  other than [facility] providers in the past year (date may be approximate).     Assessment:   This is a routine wellness examination for Patricia Davila.  Hearing/Vision screen Hearing Screening - Comments:: Denies hearing difficulties   Vision Screening - Comments:: Denies vision issues.    Goals Addressed             This Visit's Progress    DIET - EAT MORE FRUITS AND VEGETABLES   On track     Depression Screen    10/04/2023   11:02 AM 09/07/2023   10:47 AM 05/31/2023   10:37 AM 03/10/2023    3:09 PM 10/01/2022    9:51 AM 08/18/2022    2:29 PM 05/01/2022   10:56 AM  PHQ 2/9 Scores  PHQ - 2 Score 0 0 0 0 0 0 0  PHQ- 9 Score 1 6   0      Fall Risk    09/28/2023    2:46 PM 09/07/2023   10:47 AM 05/31/2023   10:37 AM 03/10/2023    3:09 PM 10/01/2022    9:53 AM  Fall Risk   Falls in the past year? 0 1 0 0 0  Number falls in past yr: 0 0 0 0 0  Injury with Fall? 0 0 0 0 0  Risk for fall due to : No Fall Risks No Fall Risks No Fall Risks No Fall Risks No Fall Risks  Follow up Falls evaluation completed;Falls prevention discussed Falls evaluation completed Falls evaluation completed Falls evaluation completed Falls prevention discussed;Falls evaluation completed    MEDICARE RISK AT HOME: Medicare Risk at Home Any stairs in or around the home?: (Patient-Rptd) No If so, are there any without handrails?: (Patient-Rptd) No Home free of loose throw rugs in walkways, pet beds, electrical cords, etc?: (Patient-Rptd) Yes Adequate lighting in your home to reduce risk of falls?: (Patient-Rptd) Yes Life alert?: (Patient-Rptd) No Use of a cane, walker or w/c?: (Patient-Rptd) No Grab bars in the bathroom?: (Patient-Rptd) No Shower chair or bench in shower?: (Patient-Rptd) No Elevated toilet seat or a handicapped toilet?: (Patient-Rptd) No  TIMED UP AND GO:  Was the test performed?  No    Cognitive Function:    04/23/2021  9:54 PM  MMSE - Mini Mental State Exam  Orientation to time 5   Registration 3  Recall 3  Language- repeat 1  Language- read & follow direction 1  Copy design 1        10/01/2022    9:58 AM  6CIT Screen  What Year? 0 points  What month? 0 points  What time? 0 points  Count back from 20 0 points  Months in reverse 4 points  Repeat phrase 2 points  Total Score 6 points    Immunizations Immunization History  Administered Date(s) Administered   Influenza, Seasonal, Injecte, Preservative Fre 05/31/2023   Influenza,inj,Quad PF,6+ Mos 05/19/2018, 06/10/2019, 05/07/2021, 08/18/2022   Td 08/17/2005   Tdap 07/10/2012   Zoster Recombinant(Shingrix) 02/12/2022, 08/18/2022    TDAP status: Due, Education has been provided regarding the importance of this vaccine. Advised may receive this vaccine at local pharmacy or Health Dept. Aware to provide a copy of the vaccination record if obtained from local pharmacy or Health Dept. Verbalized acceptance and understanding.  Flu Vaccine status: Up to date   Covid-19 vaccine status: Declined, Education has been provided regarding the importance of this vaccine but patient still declined. Advised may receive this vaccine at local pharmacy or Health Dept.or vaccine clinic. Aware to provide a copy of the vaccination record if obtained from local pharmacy or Health Dept. Verbalized acceptance and understanding.  Qualifies for Shingles Vaccine? Yes   Zostavax completed Yes   Shingrix Completed?: Yes  Screening Tests Health Maintenance  Topic Date Due   COVID-19 Vaccine (1) Never done   Cervical Cancer Screening (HPV/Pap Cotest)  11/09/2022   Medicare Annual Wellness (AWV)  10/03/2024   MAMMOGRAM  11/08/2024   Colonoscopy  10/08/2026   INFLUENZA VACCINE  Completed   Hepatitis C Screening  Completed   HIV Screening  Completed   Zoster Vaccines- Shingrix  Completed   HPV VACCINES  Aged Out   DTaP/Tdap/Td  Discontinued    Health Maintenance  Health Maintenance Due  Topic Date Due   COVID-19 Vaccine  (1) Never done   Cervical Cancer Screening (HPV/Pap Cotest)  11/09/2022    Colorectal cancer screening: Type of screening: Colonoscopy. Completed 10/08/2021. Repeat every 5 years  Mammogram status: Completed 11/09/2022. Repeat every year  Lung Cancer Screening: (Low Dose CT Chest recommended if Age 68-80 years, 20 pack-year currently smoking OR have quit w/in 15years.) does not qualify.   Lung Cancer Screening Referral: N/A  Additional Screening:  Hepatitis C Screening: does qualify; Completed 05/23/2020  Vision Screening: Recommended annual ophthalmology exams for early detection of glaucoma and other disorders of the eye. Is the patient up to date with their annual eye exam?  No  Who is the provider or what is the name of the office in which the patient attends annual eye exams? Pt requesting a referral If pt is not established with a provider, would they like to be referred to a provider to establish care? Yes .   Dental Screening: Recommended annual dental exams for proper oral hygiene   Community Resource Referral / Chronic Care Management: CRR required this visit?  No   CCM required this visit?  No     Plan:     I have personally reviewed and noted the following in the patient's chart:   Medical and social history Use of alcohol, tobacco or illicit drugs  Current medications and supplements including opioid prescriptions. Patient is not currently taking opioid prescriptions. Functional ability and status  Nutritional status Physical activity Advanced directives List of other physicians Hospitalizations, surgeries, and ER visits in previous 12 months Vitals Screenings to include cognitive, depression, and falls Referrals and appointments  In addition, I have reviewed and discussed with patient certain preventive protocols, quality metrics, and best practice recommendations. A written personalized care plan for preventive services as well as general preventive  health recommendations were provided to patient.     Erlene Devita L Hodge Stachnik, CMA   10/04/2023   After Visit Summary: (MyChart) Due to this being a telephonic visit, the after visit summary with patients personalized plan was offered to patient via MyChart   Nurse Notes: Patient is up to date with her health maintenance.  She is requesting a referral to an eye doctor.  She had no other concerns to address today.

## 2023-10-12 ENCOUNTER — Telehealth: Payer: Self-pay | Admitting: Gastroenterology

## 2023-10-12 ENCOUNTER — Encounter: Payer: 59 | Admitting: Gastroenterology

## 2023-10-12 NOTE — Telephone Encounter (Signed)
 ok

## 2023-10-12 NOTE — Telephone Encounter (Signed)
 Good Morning Dr. Lavon Paganini, I received a call from this patient stating that she had got the stomach virus and has been throwing up all last night and can not come into her procedure today. Patient stated she will call back to reschedule. Please advise.

## 2023-10-21 ENCOUNTER — Other Ambulatory Visit: Payer: Self-pay

## 2023-10-21 DIAGNOSIS — I871 Compression of vein: Secondary | ICD-10-CM

## 2023-10-27 ENCOUNTER — Ambulatory Visit (HOSPITAL_COMMUNITY)
Admission: RE | Admit: 2023-10-27 | Discharge: 2023-10-27 | Disposition: A | Discharge status: Home / Self Care | Source: Ambulatory Visit | Attending: Vascular Surgery | Admitting: Vascular Surgery

## 2023-10-27 DIAGNOSIS — I871 Compression of vein: Secondary | ICD-10-CM | POA: Insufficient documentation

## 2023-11-04 ENCOUNTER — Encounter: Payer: Self-pay | Admitting: Emergency Medicine

## 2023-11-04 NOTE — Telephone Encounter (Signed)
 Needs to follow-up with whoever removed her polyp.  Thanks.

## 2023-11-05 ENCOUNTER — Ambulatory Visit (INDEPENDENT_AMBULATORY_CARE_PROVIDER_SITE_OTHER): Payer: 59 | Admitting: Physician Assistant

## 2023-11-05 ENCOUNTER — Encounter (HOSPITAL_COMMUNITY): Payer: 59

## 2023-11-05 VITALS — BP 120/80 | HR 78 | Temp 97.8°F | Resp 93 | Ht 61.0 in | Wt 201.2 lb

## 2023-11-05 DIAGNOSIS — I82402 Acute embolism and thrombosis of unspecified deep veins of left lower extremity: Secondary | ICD-10-CM

## 2023-11-05 DIAGNOSIS — I871 Compression of vein: Secondary | ICD-10-CM

## 2023-11-05 DIAGNOSIS — M7989 Other specified soft tissue disorders: Secondary | ICD-10-CM

## 2023-11-05 NOTE — Progress Notes (Signed)
 Office Note     CC:  follow up Requesting Provider:  Georgina Quint, *  HPI: Patricia Davila is a 53 y.o. 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 Darrick Penna in October 2020.  She has mild edema in the left leg that worsens over the course of the day.  She does not wear compression or elevate her leg.  She continues to take a baby aspirin daily.  She denies tobacco use however does smoke marijuana.  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  She denies traditional claudication symptoms.  She is also without rest pain or tissue loss at the level of the feet.   Past Medical History:  Diagnosis Date   Anemia    low iron during pregnancy   Anxiety    Arthritis    Asthma    as a child   Bipolar disorder (HCC)    Depression    GERD (gastroesophageal reflux disease)    Headache    Hypertension    Left trimalleolar fracture    Pneumonia    as a child   PONV (postoperative nausea and vomiting)    Restless legs     Past Surgical History:  Procedure Laterality Date   BILATERAL ANTERIOR TOTAL HIP ARTHROPLASTY Bilateral 03/25/2017   Procedure: BILATERAL ANTERIOR TOTAL HIP ARTHROPLASTY;  Surgeon: Tarry Kos, MD;  Location: [facility] OR;  Service: Orthopedics;  Laterality: Bilateral;   CORONARY ULTRASOUND/IVUS N/A 06/09/2019   Procedure: INTRAVASCULAR ULTRASOUND/IVUS;  Surgeon: Sherren Kerns, MD;  Location: [facility] INVASIVE CV LAB;  Service: Cardiovascular;  Laterality: N/A;   DENTAL SURGERY     all teeth extracted   LAMINECTOMY AND MICRODISCECTOMY LUMBAR SPINE   06/27/2010   ORIF ANKLE FRACTURE Left 03/17/2019   Procedure: OPEN REDUCTION INTERNAL FIXATION (ORIF) LEFT ANKLE FRACTURE;  Surgeon: Tarry Kos, MD;  Location: [facility];  Service: Orthopedics;  Laterality: Left;    PERIPHERAL VASCULAR INTERVENTION Left 06/09/2019   Procedure: PERIPHERAL VASCULAR INTERVENTION;  Surgeon: Sherren Kerns, MD;  Location: [facility] INVASIVE CV LAB;  Service: Cardiovascular;  Laterality: Left;  Common Iliac vein   PERIPHERAL VASCULAR THROMBECTOMY Left 06/09/2019   Procedure: PERIPHERAL VASCULAR THROMBECTOMY;  Surgeon: Sherren Kerns, MD;  Location: [facility]VAS[facility];  Service: Cardiovascular;  Laterality: Left;   TUBAL LIGATION Bilateral     Social History   Socioeconomic History   Marital status: Married    Spouse name: Earl Lites   Number of children: 6   Years of education: Not on file   Highest education level: 10th grade  Occupational History   Occupation: Disabled  Tobacco Use   Smoking status: Former    Current packs/day: 0.25    Types: Cigarettes    Passive exposure: Current   Smokeless tobacco: Never  Vaping Use   Vaping status: Never Used  Substance and Sexual Activity   Alcohol use: No   Drug use: Yes    Types: Marijuana   Sexual activity: Yes    Birth control/protection: Surgical    Comment: BTL  Other Topics Concern   Not on file  Social History Narrative   Lives with husband and 2 young adults   Social Drivers of Health   Financial Resource Strain: Low Risk  (10/04/2023)   Overall Financial Resource  Strain (CARDIA)    Difficulty of Paying Living Expenses: Not hard at all  Food Insecurity: No Food Insecurity (10/04/2023)   Hunger Vital Sign    Worried About Running Out of Food in the Last Year: Never true    Ran Out of Food in the Last Year: Never true  Transportation Needs: No Transportation Needs (10/04/2023)   PRAPARE - Administrator, Civil Service (Medical): No    Lack of Transportation (Non-Medical): No  Physical Activity: Inactive (10/04/2023)   Exercise Vital Sign    Days of Exercise per Week: 0 days    Minutes of Exercise per Session: 0 min  Stress: No Stress Concern Present (10/04/2023)   [employer] of  Occupational Health - Occupational Stress Questionnaire    Feeling of Stress : Not at all  Social Connections: Socially Integrated (10/04/2023)   Social Connection and Isolation Panel [NHANES]    Frequency of Communication with Friends and Family: Once a week    Frequency of Social Gatherings with Friends and Family: More than three times a week    Attends Religious Services: More than 4 times per year    Active Member of [employer] or Organizations: Yes    Attends Banker Meetings: Never    Marital Status: Married  Catering manager Violence: Not At Risk (10/04/2023)   Humiliation, Afraid, Rape, and Kick questionnaire    Fear of Current or Ex-Partner: No    Emotionally Abused: No    Physically Abused: No    Sexually Abused: No    Family History  Problem Relation Age of Onset   Stomach cancer Mother    HIV Mother    Cancer Mother    Colon cancer Sister 61   Breast cancer Neg Hx    Rectal cancer Neg Hx     Current Outpatient Medications  Medication Sig Dispense Refill   amLODipine (NORVASC) 10 MG tablet Take 1 tablet (10 mg total) by mouth daily. 90 tablet 3   aspirin EC 81 MG tablet Take 81 mg by mouth daily. Swallow whole.     clobetasol cream (TEMOVATE) 0.05 % Apply 1 Application topically 2 (two) times daily. 30 g 0   cyclobenzaprine (FLEXERIL) 5 MG tablet Take 1 tablet (5 mg total) by mouth 3 (three) times daily as needed for muscle spasms. 30 tablet 3   hydrochlorothiazide (HYDRODIURIL) 12.5 MG tablet Take 1 tablet (12.5 mg total) by mouth daily. 90 tablet 3   losartan (COZAAR) 50 MG tablet Take 1 tablet (50 mg total) by mouth daily. 90 tablet 3   pantoprazole (PROTONIX) 40 MG tablet Take 1 tablet (40 mg total) by mouth in the morning. Before breakfast 30 tablet 11   traMADol (ULTRAM) 50 MG tablet Take 1-2 tablets (50-100 mg total) by mouth daily as needed. 20 tablet 0   No current facility-administered medications for this visit.    Allergies  Allergen Reactions    Ativan [Lorazepam] Anxiety    HALLUCINATIONS   Anesthesia S-I-40 [Propofol]     hallucinations   Ditropan [Oxybutynin]     Anxiety   Lipitor [Atorvastatin] Hives   Tylenol With Codeine #3 [Acetaminophen-Codeine] Hives and Itching     REVIEW OF SYSTEMS:   [X]  denotes positive finding, [ ]  denotes negative finding Cardiac  Comments:  Chest pain or chest pressure:    Shortness of breath upon exertion:    Short of breath when lying flat:    Irregular heart rhythm:  Vascular    Pain in calf, thigh, or hip brought on by ambulation:    Pain in feet at night that wakes you up from your sleep:     Blood clot in your veins:    Leg swelling:         Pulmonary    Oxygen at home:    Productive cough:     Wheezing:         Neurologic    Sudden weakness in arms or legs:     Sudden numbness in arms or legs:     Sudden onset of difficulty speaking or slurred speech:    Temporary loss of vision in one eye:     Problems with dizziness:         Gastrointestinal    Blood in stool:     Vomited blood:         Genitourinary    Burning when urinating:     Blood in urine:        Psychiatric    Major depression:         Hematologic    Bleeding problems:    Problems with blood clotting too easily:        Skin    Rashes or ulcers:        Constitutional    Fever or chills:      PHYSICAL EXAMINATION:  Vitals:   11/05/23 0926  BP: 120/80  Pulse: 78  Resp: (!) 93  Temp: 97.8 F (36.6 C)  TempSrc: Temporal  Weight: 201 lb 3.2 oz (91.3 kg)  Height: 5\' 1"  (1.549 m)    General:  WDWN in NAD; vital signs documented above Gait: Not observed HENT: WNL, normocephalic Pulmonary: normal non-labored breathing , without Rales, rhonchi,  wheezing Cardiac: regular HR Abdomen: soft, NT, no masses Skin: without rashes Vascular Exam/Pulses: 2+ palpable left PT pulse Extremities: without ischemic changes, without Gangrene , without cellulitis; without open wounds; mildly  edematous left lower leg Musculoskeletal: no muscle wasting or atrophy  Neurologic: A&O X 3 Psychiatric:  The pt has Normal affect.   Non-Invasive Vascular Imaging:   IVC widely patent Left common iliac vein stent patent   ASSESSMENT/PLAN:: 53 y.o. 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  She continues to have left lower extremity edema with venous symptoms.  Recommendations include regular use of 15 to 20 mmHg knee-high compression which she agrees to be measured for today and prescribed.  She will also need to focus on proper leg elevation periodically during the day.  She has what sounds like radiculopathy from lumbar spine stenosis.  On exam she has a palpable left PT pulse which rules out arterial insufficiency.  She can follow-up with her PCP for further workup of lumbar spine if symptoms become intolerable.  She will continue a baby aspirin daily.  We will repeat IVC and left iliac venous duplex in 1 year.   Emilie Rutter, PA-C [facility] [phone]  Clinic MD:   Randie Heinz on call

## 2023-11-08 ENCOUNTER — Other Ambulatory Visit: Payer: Self-pay | Admitting: Radiology

## 2023-11-08 NOTE — Telephone Encounter (Signed)
 Needs follow-up with GI.  Place referral please.

## 2023-11-09 ENCOUNTER — Ambulatory Visit (AMBULATORY_SURGERY_CENTER): Payer: 59 | Admitting: Gastroenterology

## 2023-11-09 ENCOUNTER — Encounter: Payer: Self-pay | Admitting: Gastroenterology

## 2023-11-09 VITALS — BP 140/100 | HR 57 | Temp 98.3°F | Resp 32 | Ht 60.0 in | Wt 200.0 lb

## 2023-11-09 DIAGNOSIS — R112 Nausea with vomiting, unspecified: Secondary | ICD-10-CM | POA: Diagnosis not present

## 2023-11-09 DIAGNOSIS — R1013 Epigastric pain: Secondary | ICD-10-CM | POA: Diagnosis not present

## 2023-11-09 DIAGNOSIS — R1310 Dysphagia, unspecified: Secondary | ICD-10-CM

## 2023-11-09 DIAGNOSIS — I10 Essential (primary) hypertension: Secondary | ICD-10-CM | POA: Diagnosis not present

## 2023-11-09 MED ORDER — SODIUM CHLORIDE 0.9 % IV SOLN: 500.0000 mL | INTRAVENOUS | Status: DC

## 2023-11-09 NOTE — Op Note (Signed)
 [facility] Patient Name: Patricia Davila Procedure Date: 11/09/2023 3:11 PM MRN: [id number] Endoscopist: Napoleon Form , MD, [id number] Age: 53 Referring MD:  Date of Birth: [birth year] Gender: Female Account #: [id number] Procedure:                Upper GI endoscopy Indications:              Dysphagia, Epigastric abdominal pain, Nausea with                            vomiting Medicines:                Monitored Anesthesia Care Procedure:                Pre-Anesthesia Assessment:                           - Prior to the procedure, a History and Physical                            was performed, and patient medications and                            allergies were reviewed. The patient's tolerance of                            previous anesthesia was also reviewed. The risks                            and benefits of the procedure and the sedation                            options and risks were discussed with the patient.                            All questions were answered, and informed consent                            was obtained. Prior Anticoagulants: The patient has                            taken no anticoagulant or antiplatelet agents. ASA                            Grade Assessment: III - A patient with severe                            systemic disease. After reviewing the risks and                            benefits, the patient was deemed in satisfactory                            condition to undergo the procedure.  After obtaining informed consent, the endoscope was                            passed under direct vision. Throughout the                            procedure, the patient's blood pressure, pulse, and                            oxygen saturations were monitored continuously. The                            Olympus Scope [id number] was introduced through the                            mouth, and advanced to the second  part of duodenum.                            The upper GI endoscopy was accomplished without                            difficulty. The patient tolerated the procedure                            well. Scope In: Scope Out: Findings:                 The Z-line was regular and was found 38 cm from the                            incisors.                           No endoscopic abnormality was evident in the                            esophagus to explain the patient's complaint of                            dysphagia.                           The stomach was normal.                           The cardia and gastric fundus were normal on                            retroflexion.                           The examined duodenum was normal. Complications:            No immediate complications. Estimated Blood Loss:     Estimated blood loss was minimal. Impression:               - Z-line regular, 38 cm from the  incisors.                           - No endoscopic esophageal abnormality to explain                            patient's dysphagia.                           - Normal stomach.                           - Normal examined duodenum.                           - No specimens collected. Recommendation:           - Patient has a contact number available for                            emergencies. The signs and symptoms of potential                            delayed complications were discussed with the                            patient. Return to normal activities tomorrow.                            Written discharge instructions were provided to the                            patient.                           - Resume previous diet.                           - Continue present medications.                           - Follow an antireflux regimen. Napoleon Form, MD 11/09/2023 3:32:18 PM This report has been signed electronically.

## 2023-11-09 NOTE — Patient Instructions (Addendum)
 -  Resume previous diet. - Continue present medications.  YOU HAD AN ENDOSCOPIC PROCEDURE TODAY AT THE [facility]:   Refer to the procedure report that was given to you for any specific questions about what was found during the examination.  If the procedure report does not answer your questions, please call your gastroenterologist to clarify.  If you requested that your care partner not be given the details of your procedure findings, then the procedure report has been included in a sealed envelope for you to review at your convenience later.  YOU SHOULD EXPECT: Some feelings of bloating in the abdomen. Passage of more gas than usual.  Walking can help get rid of the air that was put into your GI tract during the procedure and reduce the bloating. If you had a lower endoscopy (such as a colonoscopy or flexible sigmoidoscopy) you may notice spotting of blood in your stool or on the toilet paper. If you underwent a bowel prep for your procedure, you may not have a normal bowel movement for a few days.  Please Note:  You might notice some irritation and congestion in your nose or some drainage.  This is from the oxygen used during your procedure.  There is no need for concern and it should clear up in a day or so.  SYMPTOMS TO REPORT IMMEDIATELY:    Following upper endoscopy (EGD)  Vomiting of blood or coffee ground material  New chest pain or pain under the shoulder blades  Painful or persistently difficult swallowing  New shortness of breath  Fever of 100F or higher  Black, tarry-looking stools  For urgent or emergent issues, a gastroenterologist can be reached at any hour by calling [phone]. Do not use MyChart messaging for urgent concerns.    DIET:  We do recommend a small meal at first, but then you may proceed to your regular diet.  Drink plenty of fluids but you should avoid alcoholic beverages for 24 hours.  ACTIVITY:  You should plan to take it easy for the rest  of today and you should NOT DRIVE or use heavy machinery until tomorrow (because of the sedation medicines used during the test).    FOLLOW UP: Our staff will call the number listed on your records the next business day following your procedure.  We will call around 7:15- 8:00 am to check on you and address any questions or concerns that you may have regarding the information given to you following your procedure. If we do not reach you, we will leave a message.     If any biopsies were taken you will be contacted by phone or by letter within the next 1-3 weeks.  Please call us at [phone] if you have not heard about the biopsies in 3 weeks.    SIGNATURES/CONFIDENTIALITY: You and/or your care partner have signed paperwork which will be entered into your electronic medical record.  These signatures attest to the fact that that the information above on your After Visit Summary has been reviewed and is understood.  Full responsibility of the confidentiality of this discharge information lies with you and/or your care-partner.

## 2023-11-09 NOTE — Progress Notes (Signed)
 To pacu, VSS. Report to [email or web address]

## 2023-11-09 NOTE — Progress Notes (Signed)
 [facility] History and Physical   Primary Care Physician:  Georgina Quint, MD   Reason for Procedure:  Dysphagia, epgastric pain  Plan:    EGD  with possible interventions as needed     HPI: Patricia Davila is a very pleasant 53 y.o. female here for EGD for epigastric pain, dysphagia. Denies any nausea, vomiting, abdominal pain, melena or bright red blood per rectum  The risks and benefits as well as alternatives of endoscopic procedure(s) have been discussed and reviewed. All questions answered. The patient agrees to proceed.    Past Medical History:  Diagnosis Date   Anemia    low iron during pregnancy   Anxiety    Arthritis    Asthma    as a child   Bipolar disorder (HCC)    Depression    GERD (gastroesophageal reflux disease)    Headache    Hypertension    Left trimalleolar fracture    Pneumonia    as a child   PONV (postoperative nausea and vomiting)    Restless legs     Past Surgical History:  Procedure Laterality Date   BILATERAL ANTERIOR TOTAL HIP ARTHROPLASTY Bilateral 03/25/2017   Procedure: BILATERAL ANTERIOR TOTAL HIP ARTHROPLASTY;  Surgeon: Tarry Kos, MD;  Location: [facility] OR;  Service: Orthopedics;  Laterality: Bilateral;   CORONARY ULTRASOUND/IVUS N/A 06/09/2019   Procedure: INTRAVASCULAR ULTRASOUND/IVUS;  Surgeon: Sherren Kerns, MD;  Location: [facility] INVASIVE CV LAB;  Service: Cardiovascular;  Laterality: N/A;   DENTAL SURGERY     all teeth extracted   LAMINECTOMY AND MICRODISCECTOMY LUMBAR SPINE   06/27/2010   ORIF ANKLE FRACTURE Left 03/17/2019   Procedure: OPEN REDUCTION INTERNAL FIXATION (ORIF) LEFT ANKLE FRACTURE;  Surgeon: Tarry Kos, MD;  Location: [facility];  Service: Orthopedics;  Laterality: Left;   PERIPHERAL VASCULAR INTERVENTION Left 06/09/2019   Procedure: PERIPHERAL VASCULAR INTERVENTION;  Surgeon: Sherren Kerns, MD;  Location: [facility] INVASIVE CV LAB;  Service: Cardiovascular;  Laterality: Left;   Common Iliac vein   PERIPHERAL VASCULAR THROMBECTOMY Left 06/09/2019   Procedure: PERIPHERAL VASCULAR THROMBECTOMY;  Surgeon: Sherren Kerns, MD;  Location: [facility] INVASIVE CV LAB;  Service: Cardiovascular;  Laterality: Left;   TUBAL LIGATION Bilateral     Prior to Admission medications   Medication Sig Start Date End Date Taking? Authorizing Provider  amLODipine (NORVASC) 10 MG tablet Take 1 tablet (10 mg total) by mouth daily. 03/10/23  Yes SagardiaEilleen Kempf, MD  aspirin EC 81 MG tablet Take 81 mg by mouth daily. Swallow whole.   Yes [provider]  cyclobenzaprine (FLEXERIL) 5 MG tablet Take 1 tablet (5 mg total) by mouth 3 (three) times daily as needed for muscle spasms. 05/31/23  Yes Sagardia, Eilleen Kempf, MD  hydrochlorothiazide (HYDRODIURIL) 12.5 MG tablet Take 1 tablet (12.5 mg total) by mouth daily. 08/26/22  Yes Sagardia, Eilleen Kempf, MD  losartan (COZAAR) 50 MG tablet Take 1 tablet (50 mg total) by mouth daily. 03/10/23  Yes Sagardia, Eilleen Kempf, MD  pantoprazole (PROTONIX) 40 MG tablet Take 1 tablet (40 mg total) by mouth in the morning. Before breakfast 09/22/23  Yes Esterwood, Amy S, PA-C  clobetasol cream (TEMOVATE) 0.05 % Apply 1 Application topically 2 (two) times daily. 10/04/23   Georgina Quint, MD  traMADol (ULTRAM) 50 MG tablet Take 1-2 tablets (50-100 mg total) by mouth daily as needed. 09/07/23   Georgina Quint, MD    Current Outpatient Medications  Medication  Sig Dispense Refill   amLODipine (NORVASC) 10 MG tablet Take 1 tablet (10 mg total) by mouth daily. 90 tablet 3   aspirin EC 81 MG tablet Take 81 mg by mouth daily. Swallow whole.     cyclobenzaprine (FLEXERIL) 5 MG tablet Take 1 tablet (5 mg total) by mouth 3 (three) times daily as needed for muscle spasms. 30 tablet 3   hydrochlorothiazide (HYDRODIURIL) 12.5 MG tablet Take 1 tablet (12.5 mg total) by mouth daily. 90 tablet 3   losartan (COZAAR) 50 MG tablet Take 1 tablet (50 mg total) by  mouth daily. 90 tablet 3   pantoprazole (PROTONIX) 40 MG tablet Take 1 tablet (40 mg total) by mouth in the morning. Before breakfast 30 tablet 11   clobetasol cream (TEMOVATE) 0.05 % Apply 1 Application topically 2 (two) times daily. 30 g 0   traMADol (ULTRAM) 50 MG tablet Take 1-2 tablets (50-100 mg total) by mouth daily as needed. 20 tablet 0   Current Facility-Administered Medications  Medication Dose Route Frequency Provider Last Rate Last Admin   0.9 %  sodium chloride infusion  500 mL Intravenous Continuous Samaiya Awadallah, Eleonore Chiquito, MD        Allergies as of 11/09/2023 - Review Complete 11/09/2023  Allergen Reaction Noted   Versed [midazolam] Other (See Comments) 11/09/2023   Ativan [lorazepam] Anxiety 06/29/2015   Ditropan [oxybutynin] Other (See Comments) 12/27/2020   Lipitor [atorvastatin] Hives 09/22/2023   Tylenol with codeine #3 [acetaminophen-codeine] Hives and Itching 06/29/2015    Family History  Problem Relation Age of Onset   Stomach cancer Mother    HIV Mother    Cancer Mother    Colon cancer Sister 25   Breast cancer Neg Hx    Rectal cancer Neg Hx    Esophageal cancer Neg Hx     Social History   Socioeconomic History   Marital status: Married    Spouse name: Earl Lites   Number of children: 6   Years of education: Not on file   Highest education level: 10th grade  Occupational History   Occupation: Disabled  Tobacco Use   Smoking status: Former    Current packs/day: 0.25    Types: Cigarettes    Passive exposure: Current   Smokeless tobacco: Never  Vaping Use   Vaping status: Never Used  Substance and Sexual Activity   Alcohol use: No   Drug use: Yes    Types: Marijuana    Comment: 11/08/23   Sexual activity: Yes    Birth control/protection: Surgical    Comment: BTL  Other Topics Concern   Not on file  Social History Narrative   Lives with husband and 2 young adults   Social Drivers of Corporate investment banker Strain: Low Risk  (10/04/2023)    Overall Financial Resource Strain (CARDIA)    Difficulty of Paying Living Expenses: Not hard at all  Food Insecurity: No Food Insecurity (10/04/2023)   Hunger Vital Sign    Worried About Running Out of Food in the Last Year: Never true    Ran Out of Food in the Last Year: Never true  Transportation Needs: No Transportation Needs (10/04/2023)   PRAPARE - Administrator, Civil Service (Medical): No    Lack of Transportation (Non-Medical): No  Physical Activity: Inactive (10/04/2023)   Exercise Vital Sign    Days of Exercise per Week: 0 days    Minutes of Exercise per Session: 0 min  Stress: No Stress Concern Present (  10/04/2023)   [employer] of Occupational Health - Occupational Stress Questionnaire    Feeling of Stress : Not at all  Social Connections: Socially Integrated (10/04/2023)   Social Connection and Isolation Panel [NHANES]    Frequency of Communication with Friends and Family: Once a week    Frequency of Social Gatherings with Friends and Family: More than three times a week    Attends Religious Services: More than 4 times per year    Active Member of [employer] or Organizations: Yes    Attends Banker Meetings: Never    Marital Status: Married  Catering manager Violence: Not At Risk (10/04/2023)   Humiliation, Afraid, Rape, and Kick questionnaire    Fear of Current or Ex-Partner: No    Emotionally Abused: No    Physically Abused: No    Sexually Abused: No    Review of Systems:  All other review of systems negative except as mentioned in the HPI.  Physical Exam: Vital signs in last 24 hours: BP (!) 161/95   Pulse (!) 57   Temp 98.3 F (36.8 C)   Ht 5' (1.524 m)   Wt 200 lb (90.7 kg)   LMP 09/17/2021 (Approximate)   SpO2 100%   BMI 39.06 kg/m  General:   Alert, NAD Lungs:  Clear .   Heart:  Regular rate and rhythm Abdomen:  Soft, nontender and nondistended. Neuro/Psych:  Alert and cooperative. Normal mood and affect. A and O x  3  Reviewed labs, radiology imaging, old records and pertinent past GI work up  Patient is appropriate for planned procedure(s) and anesthesia in an ambulatory setting   K. Scherry Ran , MD [phone]

## 2023-11-10 ENCOUNTER — Telehealth: Payer: Self-pay | Admitting: *Deleted

## 2023-11-10 NOTE — Telephone Encounter (Signed)
  Follow up Call-     11/09/2023    2:36 PM 10/08/2021   10:49 AM  Call back number  Post procedure Call Back phone  # [phone]  Permission to leave phone message Yes Yes     Patient questions:  Do you have a fever, pain , or abdominal swelling? No. Pain Score  0 *  Have you tolerated food without any problems? Yes.    Have you been able to return to your normal activities? Yes.    Do you have any questions about your discharge instructions: Diet   No. Medications  No. Follow up visit  No.  Do you have questions or concerns about your Care? No.  Actions: * If pain score is 4 or above: No action needed, pain <4.

## 2023-11-18 ENCOUNTER — Other Ambulatory Visit: Payer: Self-pay | Admitting: Emergency Medicine

## 2023-11-18 MED ORDER — CLOBETASOL PROPIONATE 0.05 % EX CREA: 1.0000 | TOPICAL_CREAM | Freq: Two times a day (BID) | CUTANEOUS | 0 refills | Status: DC

## 2023-11-21 ENCOUNTER — Encounter: Payer: Self-pay | Admitting: Emergency Medicine

## 2023-11-22 NOTE — Telephone Encounter (Signed)
 Recommend over-the-counter Zyrtec or Claritin

## 2024-01-20 ENCOUNTER — Other Ambulatory Visit: Payer: Self-pay | Admitting: Emergency Medicine

## 2024-03-06 ENCOUNTER — Ambulatory Visit: Payer: 59 | Admitting: Emergency Medicine

## 2024-03-06 ENCOUNTER — Encounter: Payer: Self-pay | Admitting: Emergency Medicine

## 2024-03-27 ENCOUNTER — Other Ambulatory Visit: Payer: Self-pay | Admitting: Emergency Medicine

## 2024-03-27 DIAGNOSIS — M154 Erosive (osteo)arthritis: Secondary | ICD-10-CM

## 2024-03-27 DIAGNOSIS — I10 Essential (primary) hypertension: Secondary | ICD-10-CM

## 2024-03-27 DIAGNOSIS — Z0001 Encounter for general adult medical examination with abnormal findings: Secondary | ICD-10-CM

## 2024-03-27 NOTE — Telephone Encounter (Signed)
 Copied from CRM #[id number]. Topic: Clinical - Medication Refill >> Mar 27, 2024 11:37 AM Jayma L wrote: Medication: losartan  (COZAAR ) 50 MG tablet amLODipine  (NORVASC ) 10 MG tablet hydrochlorothiazide  (HYDRODIURIL ) 12.5 MG tablet traMADol  (ULTRAM ) 50 MG tablet pantoprazole  (PROTONIX ) 40 MG tablet clobetasol  cream (TEMOVATE ) 0.05 %   Has the patient contacted their pharmacy? Yes (Agent: If no, request that the patient contact the pharmacy for the refill. If patient does not wish to contact the pharmacy document the reason why and proceed with request.) (Agent: If yes, when and what did the pharmacy advise?)  This is the patient's preferred pharmacy:  [facility] [address], [place] - [address] [place] [address] Phone: [phone] Fax: [phone]  Is this the correct pharmacy for this prescription? Yes If no, delete pharmacy and type the correct one.   Has the prescription been filled recently? No  Is the patient out of the medication? Yes  Has the patient been seen for an appointment in the last year OR does the patient have an upcoming appointment? Yes  Can we respond through MyChart? Yes  Agent: Please be advised that Rx refills may take up to 3 business days. We ask that you follow-up with your pharmacy.

## 2024-03-29 MED ORDER — LOSARTAN POTASSIUM 50 MG PO TABS: 50.0000 mg | ORAL_TABLET | Freq: Every day | ORAL | 3 refills | Status: AC

## 2024-03-29 MED ORDER — AMLODIPINE BESYLATE 10 MG PO TABS: 10.0000 mg | ORAL_TABLET | Freq: Every day | ORAL | 3 refills | Status: AC

## 2024-03-29 MED ORDER — HYDROCHLOROTHIAZIDE 12.5 MG PO TABS: 12.5000 mg | ORAL_TABLET | Freq: Every day | ORAL | 3 refills | Status: AC

## 2024-03-29 MED ORDER — TRAMADOL HCL 50 MG PO TABS: 50.0000 mg | ORAL_TABLET | Freq: Every day | ORAL | 0 refills | Status: AC | PRN

## 2024-03-29 MED ORDER — CLOBETASOL PROPIONATE 0.05 % EX CREA: 1.0000 | TOPICAL_CREAM | Freq: Two times a day (BID) | CUTANEOUS | 0 refills | Status: AC

## 2024-06-13 ENCOUNTER — Ambulatory Visit: Admitting: Emergency Medicine

## 2024-06-20 ENCOUNTER — Ambulatory Visit: Admitting: Physician Assistant

## 2024-07-04 ENCOUNTER — Ambulatory Visit: Payer: Self-pay | Admitting: Emergency Medicine

## 2024-07-04 ENCOUNTER — Encounter: Payer: Self-pay | Admitting: Emergency Medicine

## 2024-07-04 ENCOUNTER — Ambulatory Visit (INDEPENDENT_AMBULATORY_CARE_PROVIDER_SITE_OTHER): Admitting: Emergency Medicine

## 2024-07-04 ENCOUNTER — Ambulatory Visit (INDEPENDENT_AMBULATORY_CARE_PROVIDER_SITE_OTHER)

## 2024-07-04 VITALS — BP 98/62 | HR 62 | Temp 98.0°F | Ht 60.0 in | Wt 206.0 lb

## 2024-07-04 DIAGNOSIS — I10 Essential (primary) hypertension: Secondary | ICD-10-CM

## 2024-07-04 DIAGNOSIS — M154 Erosive (osteo)arthritis: Secondary | ICD-10-CM

## 2024-07-04 DIAGNOSIS — F319 Bipolar disorder, unspecified: Secondary | ICD-10-CM

## 2024-07-04 DIAGNOSIS — G894 Chronic pain syndrome: Secondary | ICD-10-CM

## 2024-07-04 DIAGNOSIS — R634 Abnormal weight loss: Secondary | ICD-10-CM

## 2024-07-04 DIAGNOSIS — E785 Hyperlipidemia, unspecified: Secondary | ICD-10-CM

## 2024-07-04 DIAGNOSIS — K2100 Gastro-esophageal reflux disease with esophagitis, without bleeding: Secondary | ICD-10-CM

## 2024-07-04 LAB — URINALYSIS, ROUTINE W REFLEX MICROSCOPIC
Bilirubin Urine: NEGATIVE
Ketones, ur: NEGATIVE
Leukocytes,Ua: NEGATIVE
Nitrite: NEGATIVE
Specific Gravity, Urine: 1.015 (ref 1.000–1.030)
Total Protein, Urine: NEGATIVE
Urine Glucose: NEGATIVE
Urobilinogen, UA: 0.2 (ref 0.0–1.0)
pH: 6.5 (ref 5.0–8.0)

## 2024-07-04 LAB — POCT GLYCOSYLATED HEMOGLOBIN (HGB A1C): HbA1c POC (<> result, manual entry): 5.6 %

## 2024-07-04 LAB — COMPREHENSIVE METABOLIC PANEL WITH GFR
ALT: 27 U/L (ref 0–35)
AST: 26 U/L (ref 0–37)
Albumin: 4.3 g/dL (ref 3.5–5.2)
Alkaline Phosphatase: 102 U/L (ref 39–117)
BUN: 10 mg/dL (ref 6–23)
CO2: 28 meq/L (ref 19–32)
Calcium: 9.6 mg/dL (ref 8.4–10.5)
Chloride: 103 meq/L (ref 96–112)
Creatinine, Ser: 0.79 mg/dL (ref 0.40–1.20)
GFR: 85.4 mL/min (ref >60.00)
Glucose, Bld: 85 mg/dL (ref 70–99)
Potassium: 3.9 meq/L (ref 3.5–5.1)
Sodium: 139 meq/L (ref 135–145)
Total Bilirubin: 0.5 mg/dL (ref 0.2–1.2)
Total Protein: 7.4 g/dL (ref 6.0–8.3)

## 2024-07-04 LAB — CBC WITH DIFFERENTIAL/PLATELET
Basophils Absolute: 0 K/uL (ref 0.0–0.1)
Basophils Relative: 0.3 % (ref 0.0–3.0)
Eosinophils Absolute: 0.1 K/uL (ref 0.0–0.7)
Eosinophils Relative: 1.5 % (ref 0.0–5.0)
HCT: 39.3 % (ref 36.0–46.0)
Hemoglobin: 13.3 g/dL (ref 12.0–15.0)
Lymphocytes Relative: 31.4 % (ref 12.0–46.0)
Lymphs Abs: 2.6 K/uL (ref 0.7–4.0)
MCHC: 33.9 g/dL (ref 30.0–36.0)
MCV: 88 fl (ref 78.0–100.0)
Monocytes Absolute: 0.5 K/uL (ref 0.1–1.0)
Monocytes Relative: 6.1 % (ref 3.0–12.0)
Neutro Abs: 5.1 K/uL (ref 1.4–7.7)
Neutrophils Relative %: 60.7 % (ref 43.0–77.0)
Platelets: 291 K/uL (ref 150.0–400.0)
RBC: 4.46 Mil/uL (ref 3.87–5.11)
RDW: 15.2 % (ref 11.5–15.5)
WBC: 8.4 K/uL (ref 4.0–10.5)

## 2024-07-04 LAB — LIPID PANEL
Cholesterol: 184 mg/dL (ref 0–200)
HDL: 36.4 mg/dL — ABNORMAL LOW (ref >39.00)
LDL Cholesterol: 133 mg/dL — ABNORMAL HIGH (ref 0–99)
NonHDL: 147.28
Total CHOL/HDL Ratio: 5
Triglycerides: 71 mg/dL (ref 0.0–149.0)
VLDL: 14.2 mg/dL (ref 0.0–40.0)

## 2024-07-04 LAB — TSH: TSH: 0.59 u[IU]/mL (ref 0.35–5.50)

## 2024-07-04 NOTE — Progress Notes (Signed)
 Patricia Davila 53 y.o.   Chief Complaint  Patient presents with   Follow-up    Pt has a loss appetite     HISTORY OF PRESENT ILLNESS: This is a 53 y.o. female complaining of loss of appetite for several months. Was able to follow-up with a GI doctor.  Unremarkable upper endoscopy No other associated symptoms Secondhand smoking No other complaints or medical concerns today.  HPI   Prior to Admission medications   Medication Sig Start Date End Date Taking? Authorizing Provider  amLODipine  (NORVASC ) 10 MG tablet Take 1 tablet (10 mg total) by mouth daily. 03/29/24  Yes Purcell Emil Schanz, MD  aspirin  EC 81 MG tablet Take 81 mg by mouth daily. Swallow whole.   Yes [provider]  clobetasol  cream (TEMOVATE ) 0.05 % Apply 1 Application topically 2 (two) times daily. 03/29/24  Yes Mcarthur Ivins, Emil Schanz, MD  cyclobenzaprine  (FLEXERIL ) 5 MG tablet Take 1 tablet (5 mg total) by mouth 3 (three) times daily as needed for muscle spasms. 05/31/23  Yes Klinton Candelas, Emil Schanz, MD  hydrochlorothiazide  (HYDRODIURIL ) 12.5 MG tablet Take 1 tablet (12.5 mg total) by mouth daily. 03/29/24  Yes Gerilynn Mccullars, Emil Schanz, MD  losartan  (COZAAR ) 50 MG tablet Take 1 tablet (50 mg total) by mouth daily. 03/29/24  Yes Fredna Stricker, Emil Schanz, MD  pantoprazole  (PROTONIX ) 40 MG tablet Take 1 tablet (40 mg total) by mouth in the morning. Before breakfast 09/22/23  Yes Esterwood, Amy S, PA-C  traMADol  (ULTRAM ) 50 MG tablet Take 1-2 tablets (50-100 mg total) by mouth daily as needed. 03/29/24  Yes Kelty Szafran, Emil Schanz, MD    Allergies  Allergen Reactions   Versed  [email or web address] ] Other (See Comments)    Hallucinations, ready to fight    Ativan [Lorazepam] Anxiety    HALLUCINATIONS   Ditropan  [Oxybutynin ] Other (See Comments)    Anxiety   Lipitor [Atorvastatin ] Hives   Tylenol  With Codeine #3 [Acetaminophen -Codeine] Hives and Itching    Patient Active Problem List   Diagnosis Date Noted   Esophageal dysphagia  09/07/2023   External hemorrhoids 05/31/2023   Internal hemorrhoids 05/31/2023   Gastroesophageal reflux disease with esophagitis without hemorrhage 03/10/2023   Former smoker 03/10/2023   Right carpal tunnel syndrome 08/21/2022   Trigger middle finger of right hand 08/21/2022   Mallet finger of right hand 08/18/2022   Nonintractable chronic migraine 02/12/2022   Dyslipidemia 07/02/2021   Chronic pain syndrome 07/02/2021   History of blood clots 07/02/2021   Erosive osteoarthritis of multiple sites 07/02/2021   Primary osteoarthritis of right hip 04/08/2017   History of hip replacement 03/25/2017   Primary osteoarthritis of left hip 12/22/2016   Major depressive disorder 10/24/2012   Microalbuminuria 04/16/2009   Bipolar 1 disorder, depressed (HCC) 05/17/2007   Lumbosacral degenerative disk disease 03/23/2005   Hypertension 08/18/2003    Past Medical History:  Diagnosis Date   Anemia    low iron during pregnancy   Anxiety    Arthritis    Asthma    as a child   Bipolar disorder (HCC)    Depression    GERD (gastroesophageal reflux disease)    Headache    Hypertension    Left trimalleolar fracture    Pneumonia    as a child   PONV (postoperative nausea and vomiting)    Restless legs     Past Surgical History:  Procedure Laterality Date   BILATERAL ANTERIOR TOTAL HIP ARTHROPLASTY Bilateral 03/25/2017   Procedure: BILATERAL ANTERIOR TOTAL HIP ARTHROPLASTY;  Surgeon: Jerri Kay HERO, MD;  Location: [facility] OR;  Service: Orthopedics;  Laterality: Bilateral;   CORONARY ULTRASOUND/IVUS N/A 06/09/2019   Procedure: INTRAVASCULAR ULTRASOUND/IVUS;  Surgeon: Harvey Carlin BRAVO, MD;  Location: [facility] INVASIVE CV LAB;  Service: Cardiovascular;  Laterality: N/A;   DENTAL SURGERY     all teeth extracted   LAMINECTOMY AND MICRODISCECTOMY LUMBAR SPINE   06/27/2010   ORIF ANKLE FRACTURE Left 03/17/2019   Procedure: OPEN REDUCTION INTERNAL FIXATION (ORIF) LEFT ANKLE FRACTURE;  Surgeon: Jerri Kay HERO,  MD;  Location: [facility];  Service: Orthopedics;  Laterality: Left;   PERIPHERAL VASCULAR INTERVENTION Left 06/09/2019   Procedure: PERIPHERAL VASCULAR INTERVENTION;  Surgeon: Harvey Carlin BRAVO, MD;  Location: [facility] INVASIVE CV LAB;  Service: Cardiovascular;  Laterality: Left;  Common Iliac vein   PERIPHERAL VASCULAR THROMBECTOMY Left 06/09/2019   Procedure: PERIPHERAL VASCULAR THROMBECTOMY;  Surgeon: Harvey Carlin BRAVO, MD;  Location: [facility];  Service: Cardiovascular;  Laterality: Left;   TUBAL LIGATION Bilateral     Social History   Socioeconomic History   Marital status: Married    Spouse name: Cordella   Number of children: 6   Years of education: Not on file   Highest education level: 10th grade  Occupational History   Occupation: Disabled  Tobacco Use   Smoking status: Former    Current packs/day: 0.25    Types: Cigarettes    Passive exposure: Current   Smokeless tobacco: Never  Vaping Use   Vaping status: Never Used  Substance and Sexual Activity   Alcohol use: No   Drug use: Yes    Types: Marijuana    Comment: 11/08/23   Sexual activity: Yes    Birth control/protection: Surgical    Comment: BTL  Other Topics Concern   Not on file  Social History Narrative   Lives with husband and 2 young adults   Social Drivers of Health   Financial Resource Strain: Medium Risk (07/03/2024)   Overall Financial Resource Strain (CARDIA)    Difficulty of Paying Living Expenses: Somewhat hard  Food Insecurity: No Food Insecurity (07/03/2024)   Hunger Vital Sign    Worried About Running Out of Food in the Last Year: Never true    Ran Out of Food in the Last Year: Never true  Transportation Needs: No Transportation Needs (07/03/2024)   PRAPARE - Administrator, Civil Service (Medical): No    Lack of Transportation (Non-Medical): No  Physical Activity: Inactive (10/04/2023)   Exercise Vital Sign    Days of Exercise per Week: 0 days    Minutes of  Exercise per Session: 0 min  Stress: No Stress Concern Present (10/04/2023)   [employer] of Occupational Health - Occupational Stress Questionnaire    Feeling of Stress : Not at all  Social Connections: Socially Integrated (07/03/2024)   Social Connection and Isolation Panel    Frequency of Communication with Friends and Family: More than three times a week    Frequency of Social Gatherings with Friends and Family: More than three times a week    Attends Religious Services: More than 4 times per year    Active Member of [employer] or Organizations: Yes    Attends Banker Meetings: More than 4 times per year    Marital Status: Married  Catering Manager Violence: Not At Risk (10/04/2023)   Humiliation, Afraid, Rape, and Kick questionnaire    Fear of Current or Ex-Partner: No    Emotionally Abused:  No    Physically Abused: No    Sexually Abused: No    Family History  Problem Relation Age of Onset   Stomach cancer Mother    HIV Mother    Cancer Mother    Colon cancer Sister 71   Breast cancer Neg Hx    Rectal cancer Neg Hx    Esophageal cancer Neg Hx      Review of Systems  Constitutional:  Positive for weight loss.  HENT: Negative.  Negative for congestion and sore throat.   Respiratory: Negative.  Negative for cough and shortness of breath.   Cardiovascular: Negative.  Negative for chest pain and palpitations.  Gastrointestinal:  Negative for abdominal pain, blood in stool, constipation, diarrhea, nausea and vomiting.  Genitourinary: Negative.  Negative for dysuria and hematuria.  Skin: Negative.  Negative for rash.  Neurological: Negative.  Negative for dizziness and headaches.  All other systems reviewed and are negative.   Today's Vitals   07/04/24 0949  BP: 98/62  Pulse: 62  Temp: 98 F (36.7 C)  TempSrc: Oral  Weight: 206 lb (93.4 kg)  Height: 5' (1.524 m)   Body mass index is 40.23 kg/m.   Physical Exam Vitals reviewed.  Constitutional:       Appearance: Normal appearance.  HENT:     Head: Normocephalic.     Mouth/Throat:     Mouth: Mucous membranes are moist.     Pharynx: Oropharynx is clear.  Eyes:     Extraocular Movements: Extraocular movements intact.     Pupils: Pupils are equal, round, and reactive to light.  Cardiovascular:     Rate and Rhythm: Normal rate and regular rhythm.     Pulses: Normal pulses.     Heart sounds: Normal heart sounds.  Pulmonary:     Effort: Pulmonary effort is normal.     Breath sounds: Normal breath sounds.  Abdominal:     Palpations: Abdomen is soft.     Tenderness: There is no abdominal tenderness.  Musculoskeletal:     Cervical back: No tenderness.  Lymphadenopathy:     Cervical: No cervical adenopathy.  Skin:    General: Skin is warm and dry.     Capillary Refill: Capillary refill takes less than 2 seconds.  Neurological:     General: No focal deficit present.     Mental Status: She is alert and oriented to person, place, and time.  Psychiatric:        Mood and Affect: Mood normal.        Behavior: Behavior normal.    Results for orders placed or performed in visit on 07/04/24 (from the past 24 hours)  POCT HgB A1C     Status: Normal   Collection Time: 07/04/24 10:14 AM  Result Value Ref Range   Hemoglobin A1C     HbA1c POC (<> result, manual entry) 5.6 4.0 - 5.6 %   HbA1c, POC (prediabetic range)     HbA1c, POC (controlled diabetic range)     DG Chest 2 View Result Date: 07/04/2024 EXAM: 2 VIEW(S) XRAY OF THE CHEST 07/04/2024 11:06:52 AM COMPARISON: 03/10/2023 CLINICAL HISTORY: Weight loss FINDINGS: LUNGS AND PLEURA: The lungs appear clear. No blunting of the costophrenic angles. No pneumothorax. HEART AND MEDIASTINUM: Mild enlargement of the cardiopericardial silhouette with cardiothoracic index 0.55. BONES AND SOFT TISSUES: No acute osseous abnormality. IMPRESSION: 1. No acute cardiopulmonary findings. 2. Mild cardiomegaly. Electronically signed by: Ryan Salvage  MD 07/04/2024 11:29 AM EST RP Workstation:  [id number]      ASSESSMENT & PLAN: A total of 43 minutes was spent with the patient and counseling/coordination of care regarding preparing for this visit, review of most recent office visit notes, review of most recent gastrointestinal doctor office visit notes, review of most recent upper endoscopy report, review of colonoscopy report from 2023, differential diagnosis of weight loss and need for workup, review of chest x-ray images done today, review of multiple chronic medical conditions and their management, review of all medications, review of most recent bloodwork results, review of health maintenance items, education on nutrition, prognosis, documentation, and need for follow up.   Problem List Items Addressed This Visit       Cardiovascular and Mediastinum   Hypertension   BP Readings from Last 3 Encounters:  07/04/24 98/62  11/09/23 (!) 140/100  11/05/23 120/80  Well-controlled hypertension with normal readings at home.  Low reading in the office today. Continue amlodipine  10 mg daily and hydrochlorothiazide  12.5 mg daily with losartan  50 mg daily         Digestive   Gastroesophageal reflux disease with esophagitis without hemorrhage   Unremarkable recent upper endoscopy Report reviewed with patient        Musculoskeletal and Integument   Erosive osteoarthritis of multiple sites   On and off pain to multiple joints. Contributing to chronic pain Recommend referral to pain management clinic Pain management discussed        Other   Bipolar 1 disorder, depressed (HCC)   Stable.  Continue Effexor  37.5 mg daily and duloxetine  30 mg daily.      Dyslipidemia   Stable.  Diet and nutrition discussed. Advised to decrease amount of daily carbohydrate intake and daily calories and increase amount of plant-based protein in her diet. Continue atorvastatin  10 mg daily. Lipid profile done today      Relevant Orders   POCT HgB A1C  (Completed)   Urinalysis   Comprehensive metabolic panel with GFR   Lipid panel   Chronic pain syndrome   Has been to pain management clinics in the past. Patient still smokes marijuana and occasionally uses Flexeril  Has been dismissed from clinics in the past due to failed toxicology screens. Chronic pain however affecting quality of life significantly. Pain management discussed      Unintentional weight loss - Primary   Wt Readings from Last 3 Encounters:  07/04/24 206 lb (93.4 kg)  11/09/23 200 lb (90.7 kg)  11/05/23 201 lb 3.2 oz (91.3 kg)  Clinically stable.  Differential diagnosis discussed. Needs workup.  Recommend blood work and urine testing today Recommend chest x-ray today Recommend CT scan of abdomen and pelvis Diet and nutrition discussed Will follow-up after results are back       Relevant Orders   DG Chest 2 View (Completed)   Urinalysis   CBC with Differential/Platelet   Comprehensive metabolic panel with GFR   TSH   Lipid panel   HIV Antibody (routine testing w rflx)   Hepatitis C antibody   CT ABDOMEN PELVIS W CONTRAST   Patient Instructions  Health Maintenance, Female Adopting a healthy lifestyle and getting preventive care are important in promoting health and wellness. Ask your health care provider about: The right schedule for you to have regular tests and exams. Things you can do on your own to prevent diseases and keep yourself healthy. What should I know about diet, weight, and exercise? Eat a healthy diet  Eat a diet that includes plenty of vegetables, fruits,  low-fat dairy products, and lean protein. Do not eat a lot of foods that are high in solid fats, added sugars, or sodium. Maintain a healthy weight Body mass index (BMI) is used to identify weight problems. It estimates body fat based on height and weight. Your health care provider can help determine your BMI and help you achieve or maintain a healthy weight. Get regular exercise Get  regular exercise. This is one of the most important things you can do for your health. Most adults should: Exercise for at least 150 minutes each week. The exercise should increase your heart rate and make you sweat (moderate-intensity exercise). Do strengthening exercises at least twice a week. This is in addition to the moderate-intensity exercise. Spend less time sitting. Even light physical activity can be beneficial. Watch cholesterol and blood lipids Have your blood tested for lipids and cholesterol at 53 years of age, then have this test every 5 years. Have your cholesterol levels checked more often if: Your lipid or cholesterol levels are high. You are older than 53 years of age. You are at high risk for heart disease. What should I know about cancer screening? Depending on your health history and family history, you may need to have cancer screening at various ages. This may include screening for: Breast cancer. Cervical cancer. Colorectal cancer. Skin cancer. Lung cancer. What should I know about heart disease, diabetes, and high blood pressure? Blood pressure and heart disease High blood pressure causes heart disease and increases the risk of stroke. This is more likely to develop in people who have high blood pressure readings or are overweight. Have your blood pressure checked: Every 3-5 years if you are 50-90 years of age. Every year if you are 25 years old or older. Diabetes Have regular diabetes screenings. This checks your fasting blood sugar level. Have the screening done: Once every three years after age 53 if you are at a normal weight and have a low risk for diabetes. More often and at a younger age if you are overweight or have a high risk for diabetes. What should I know about preventing infection? Hepatitis B If you have a higher risk for hepatitis B, you should be screened for this virus. Talk with your health care provider to find out if you are at risk for  hepatitis B infection. Hepatitis C Testing is recommended for: Everyone born from [birth year] through 1965. Anyone with known risk factors for hepatitis C. Sexually transmitted infections (STIs) Get screened for STIs, including gonorrhea and chlamydia, if: You are sexually active and are younger than 53 years of age. You are older than 53 years of age and your health care provider tells you that you are at risk for this type of infection. Your sexual activity has changed since you were last screened, and you are at increased risk for chlamydia or gonorrhea. Ask your health care provider if you are at risk. Ask your health care provider about whether you are at high risk for HIV. Your health care provider may recommend a prescription medicine to help prevent HIV infection. If you choose to take medicine to prevent HIV, you should first get tested for HIV. You should then be tested every 3 months for as long as you are taking the medicine. Pregnancy If you are about to stop having your period (premenopausal) and you may become pregnant, seek counseling before you get pregnant. Take 400 to 800 micrograms (mcg) of folic acid every day if you become pregnant.  Ask for birth control (contraception) if you want to prevent pregnancy. Osteoporosis and menopause Osteoporosis is a disease in which the bones lose minerals and strength with aging. This can result in bone fractures. If you are 23 years old or older, or if you are at risk for osteoporosis and fractures, ask your health care provider if you should: Be screened for bone loss. Take a calcium  or vitamin D  supplement to lower your risk of fractures. Be given hormone replacement therapy (HRT) to treat symptoms of menopause. Follow these instructions at home: Alcohol use Do not drink alcohol if: Your health care provider tells you not to drink. You are pregnant, may be pregnant, or are planning to become pregnant. If you drink alcohol: Limit how much  you have to: 0-1 drink a day. Know how much alcohol is in your drink. In the [place] one drink equals one 12 oz bottle of beer (355 mL), one 5 oz glass of wine (148 mL), or one 1 oz glass of hard liquor (44 mL). Lifestyle Do not use any products that contain nicotine  or tobacco. These products include cigarettes, chewing tobacco, and vaping devices, such as e-cigarettes. If you need help quitting, ask your health care provider. Do not use street drugs. Do not share needles. Ask your health care provider for help if you need support or information about quitting drugs. General instructions Schedule regular health, dental, and eye exams. Stay current with your vaccines. Tell your health care provider if: You often feel depressed. You have ever been abused or do not feel safe at home. Summary Adopting a healthy lifestyle and getting preventive care are important in promoting health and wellness. Follow your health care provider's instructions about healthy diet, exercising, and getting tested or screened for diseases. Follow your health care provider's instructions on monitoring your cholesterol and blood pressure. This information is not intended to replace advice given to you by your health care provider. Make sure you discuss any questions you have with your health care provider. Document Revised: 12/23/2020 Document Reviewed: 12/23/2020 Elsevier Patient Education  2024 Elsevier Inc.     Emil Schaumann, MD [facility]

## 2024-07-04 NOTE — Assessment & Plan Note (Signed)
 Unremarkable recent upper endoscopy Report reviewed with patient

## 2024-07-04 NOTE — Assessment & Plan Note (Signed)
 Has been to pain management clinics in the past. Patient still smokes marijuana and occasionally uses Flexeril  Has been dismissed from clinics in the past due to failed toxicology screens. Chronic pain however affecting quality of life significantly. Pain management discussed

## 2024-07-04 NOTE — Assessment & Plan Note (Signed)
Stable.  Continue Effexor 37.5 mg daily and duloxetine 30 mg daily.

## 2024-07-04 NOTE — Assessment & Plan Note (Signed)
 BP Readings from Last 3 Encounters:  07/04/24 98/62  11/09/23 (!) 140/100  11/05/23 120/80  Well-controlled hypertension with normal readings at home.  Low reading in the office today. Continue amlodipine  10 mg daily and hydrochlorothiazide  12.5 mg daily with losartan  50 mg daily

## 2024-07-04 NOTE — Patient Instructions (Signed)

## 2024-07-04 NOTE — Assessment & Plan Note (Signed)
 Wt Readings from Last 3 Encounters:  07/04/24 206 lb (93.4 kg)  11/09/23 200 lb (90.7 kg)  11/05/23 201 lb 3.2 oz (91.3 kg)  Clinically stable.  Differential diagnosis discussed. Needs workup.  Recommend blood work and urine testing today Recommend chest x-ray today Recommend CT scan of abdomen and pelvis Diet and nutrition discussed Will follow-up after results are back

## 2024-07-04 NOTE — Assessment & Plan Note (Signed)
 On and off pain to multiple joints. Contributing to chronic pain Recommend referral to pain management clinic Pain management discussed

## 2024-07-04 NOTE — Assessment & Plan Note (Signed)
 Stable.  Diet and nutrition discussed. Advised to decrease amount of daily carbohydrate intake and daily calories and increase amount of plant-based protein in her diet. Continue atorvastatin 10 mg daily. Lipid profile done today

## 2024-07-05 LAB — HIV ANTIBODY (ROUTINE TESTING W REFLEX)
HIV 1&2 Ab, 4th Generation: NONREACTIVE
HIV FINAL INTERPRETATION: NEGATIVE

## 2024-07-05 LAB — HEPATITIS C ANTIBODY: Hepatitis C Ab: NONREACTIVE

## 2024-07-10 ENCOUNTER — Other Ambulatory Visit

## 2024-07-17 ENCOUNTER — Ambulatory Visit: Payer: Self-pay | Admitting: *Deleted

## 2024-07-17 NOTE — Telephone Encounter (Signed)
 Chest pain should be seen in the emergency department

## 2024-07-17 NOTE — Telephone Encounter (Signed)
 FYI Only or Action Required?: Action required by provider: update on patient condition, lab or test result follow-up needed, and if another OV needed.  Patient was last seen in primary care on 07/04/2024 by Purcell Emil Schanz, MD.  Called Nurse Triage reporting Chest Pain.  Symptoms began several days ago.  Interventions attempted: Rest, hydration, or home remedies.  Symptoms are: stable.  Triage Disposition: See Physician Within 24 Hours  Patient/caregiver understands and will follow disposition?: No, wishes to speak with PCP    Offered appt with PCP and patient declined. Please see NT encounter              Reason for Disposition  [1] Chest pain lasts > 5 minutes AND [2] occurred > 3 days ago (72 hours) AND [3] NO chest pain or cardiac symptoms now  Answer Assessment - Initial Assessment Questions Patient calling to discuss recent lab results. Reviewed message from PCP from 07/04/24 lab results note with patient and patient continues to have more questions. Reports she has chest pain at times with exertion and getting worked up. Hx abdominal blood clot with stent. Denies chest pain now no SOB or dizziness at this time. Offered appt with PCP and patient declined at would like to know what PCP advise of results from chest xray of mild cardiomegaly. Requesting medication for abnormal results from urine culture. Reinforced to patient PCP is aware of results and would be notified that patient has further questions. Recommended if chest pain returns go to ED. Last chest pain reported before Thanksgiving on 07/13/24.      1. LOCATION: Where does it hurt?       Left side under breast 2. RADIATION: Does the pain go anywhere else? (e.g., into neck, jaw, arms, back)     No  3. ONSET: When did the chest pain begin? (Minutes, hours or days)      1 month  4. PATTERN: Does the pain come and go, or has it been constant since it started?  Does it get worse with  exertion?      Comes and goes , some SOB  with exertion 5. DURATION: How long does it last (e.g., seconds, minutes, hours)     Lasts 5 minutes at times. And decrease anxiety  6. SEVERITY: How bad is the pain?  (e.g., Scale 1-10; mild, moderate, or severe)     No  7. CARDIAC RISK FACTORS: Do you have any history of heart problems or risk factors for heart disease? (e.g., angina, prior heart attack; diabetes, high blood pressure, high cholesterol, smoker, or strong family history of heart disease)     Hx blood clot surgery in stomach  8. PULMONARY RISK FACTORS: Do you have any history of lung disease?  (e.g., blood clots in lung, asthma, emphysema, birth control pills)     Hx blood clot in abdomen  9. CAUSE: What do you think is causing the chest pain?     Not sure  10. OTHER SYMPTOMS: Do you have any other symptoms? (e.g., dizziness, nausea, vomiting, sweating, fever, difficulty breathing, cough)    No chest pain now.   Chest pain /discomfort with exertion, SOB with exertion, dizziness at times. Patient concerned regarding recent lab results and chest x ray .  11. PREGNANCY: Is there any chance you are pregnant? When was your last menstrual period?       na  Protocols used: Chest Pain-A-AH

## 2024-07-17 NOTE — Telephone Encounter (Signed)
 I have contacted the patient and informed her that she does need to go to the ED for this as her provider has recommended this as well patient verbalized she understood and there were no other concerns at this time

## 2024-07-17 NOTE — Telephone Encounter (Signed)
 Please advise

## 2024-07-18 ENCOUNTER — Other Ambulatory Visit

## 2024-07-31 ENCOUNTER — Inpatient Hospital Stay: Admission: RE | Admit: 2024-07-31

## 2024-08-22 ENCOUNTER — Inpatient Hospital Stay: Admission: RE | Admit: 2024-08-22 | Discharge: 2024-08-22 | Attending: Emergency Medicine

## 2024-08-22 DIAGNOSIS — R634 Abnormal weight loss: Secondary | ICD-10-CM

## 2024-08-22 MED ORDER — IOPAMIDOL (ISOVUE-300) INJECTION 61%
100.0000 mL | Freq: Once | INTRAVENOUS | Status: AC | PRN
  Administered 2024-08-22: 100 mL via INTRAVENOUS

## 2024-08-24 NOTE — Telephone Encounter (Signed)
 Nothing to be concerned about but recommend follow-up office visit to go over results.

## 2024-08-24 NOTE — Telephone Encounter (Signed)
 Please advise

## 2024-09-08 ENCOUNTER — Other Ambulatory Visit: Payer: Self-pay | Admitting: Emergency Medicine

## 2024-09-08 DIAGNOSIS — G894 Chronic pain syndrome: Secondary | ICD-10-CM

## 2024-10-04 ENCOUNTER — Ambulatory Visit: Payer: 59

## 2025-01-01 ENCOUNTER — Ambulatory Visit: Admitting: Emergency Medicine
